# Patient Record
Sex: Female | Born: 1950 | Race: White | Hispanic: No | Marital: Married | Smoking: Never smoker
Health system: Southern US, Community
[De-identification: ages and names within clinical notes are randomized; demographics above are authoritative.]

## PROBLEM LIST (undated history)

## (undated) DIAGNOSIS — I43 Cardiomyopathy in diseases classified elsewhere: Secondary | ICD-10-CM

## (undated) DIAGNOSIS — R7881 Bacteremia: Secondary | ICD-10-CM

## (undated) DIAGNOSIS — E785 Hyperlipidemia, unspecified: Secondary | ICD-10-CM

## (undated) DIAGNOSIS — I2510 Atherosclerotic heart disease of native coronary artery without angina pectoris: Secondary | ICD-10-CM

## (undated) DIAGNOSIS — E039 Hypothyroidism, unspecified: Secondary | ICD-10-CM

## (undated) DIAGNOSIS — K219 Gastro-esophageal reflux disease without esophagitis: Secondary | ICD-10-CM

## (undated) DIAGNOSIS — E7800 Pure hypercholesterolemia, unspecified: Secondary | ICD-10-CM

## (undated) DIAGNOSIS — F419 Anxiety disorder, unspecified: Secondary | ICD-10-CM

## (undated) DIAGNOSIS — I700 Atherosclerosis of aorta: Secondary | ICD-10-CM

## (undated) DIAGNOSIS — N189 Chronic kidney disease, unspecified: Secondary | ICD-10-CM

## (undated) DIAGNOSIS — T50905A Adverse effect of unspecified drugs, medicaments and biological substances, initial encounter: Secondary | ICD-10-CM

## (undated) DIAGNOSIS — J189 Pneumonia, unspecified organism: Secondary | ICD-10-CM

## (undated) DIAGNOSIS — N183 Chronic kidney disease, stage 3 unspecified: Secondary | ICD-10-CM

## (undated) DIAGNOSIS — Z9989 Dependence on other enabling machines and devices: Secondary | ICD-10-CM

## (undated) DIAGNOSIS — R9431 Abnormal electrocardiogram [ECG] [EKG]: Secondary | ICD-10-CM

## (undated) DIAGNOSIS — Z9289 Personal history of other medical treatment: Secondary | ICD-10-CM

## (undated) DIAGNOSIS — M1990 Unspecified osteoarthritis, unspecified site: Secondary | ICD-10-CM

## (undated) DIAGNOSIS — I428 Other cardiomyopathies: Secondary | ICD-10-CM

## (undated) DIAGNOSIS — E669 Obesity, unspecified: Secondary | ICD-10-CM

## (undated) DIAGNOSIS — G4733 Obstructive sleep apnea (adult) (pediatric): Secondary | ICD-10-CM

## (undated) DIAGNOSIS — R011 Cardiac murmur, unspecified: Secondary | ICD-10-CM

## (undated) DIAGNOSIS — I10 Essential (primary) hypertension: Secondary | ICD-10-CM

## (undated) DIAGNOSIS — I480 Paroxysmal atrial fibrillation: Secondary | ICD-10-CM

## (undated) DIAGNOSIS — I5032 Chronic diastolic (congestive) heart failure: Secondary | ICD-10-CM

## (undated) HISTORY — DX: Essential (primary) hypertension: I10

## (undated) HISTORY — PX: TONSILLECTOMY: SUR1361

## (undated) HISTORY — DX: Paroxysmal atrial fibrillation: I48.0

## (undated) HISTORY — DX: Chronic diastolic (congestive) heart failure: I50.32

## (undated) HISTORY — DX: Obesity, unspecified: E66.9

## (undated) HISTORY — DX: Adverse effect of unspecified drugs, medicaments and biological substances, initial encounter: T50.905A

## (undated) HISTORY — PX: CARDIOVERSION: SHX1299

## (undated) HISTORY — DX: Gastro-esophageal reflux disease without esophagitis: K21.9

## (undated) HISTORY — DX: Cardiomyopathy in diseases classified elsewhere: I43

## (undated) HISTORY — DX: Atherosclerotic heart disease of native coronary artery without angina pectoris: I25.10

## (undated) HISTORY — DX: Abnormal electrocardiogram (ECG) (EKG): R94.31

## (undated) HISTORY — DX: Bacteremia: R78.81

## (undated) HISTORY — DX: Other cardiomyopathies: I42.8

---

## 1973-04-26 DIAGNOSIS — Z9289 Personal history of other medical treatment: Secondary | ICD-10-CM

## 1973-04-26 HISTORY — DX: Personal history of other medical treatment: Z92.89

## 1973-04-26 HISTORY — PX: ECTOPIC PREGNANCY SURGERY: SHX613

## 2005-04-26 HISTORY — PX: BREAST BIOPSY: SHX20

## 2006-01-12 ENCOUNTER — Ambulatory Visit: Payer: Self-pay

## 2013-08-27 ENCOUNTER — Inpatient Hospital Stay: Payer: Self-pay | Admitting: Internal Medicine

## 2013-08-27 DIAGNOSIS — I10 Essential (primary) hypertension: Secondary | ICD-10-CM

## 2013-08-27 DIAGNOSIS — I4891 Unspecified atrial fibrillation: Secondary | ICD-10-CM

## 2013-08-27 DIAGNOSIS — I5032 Chronic diastolic (congestive) heart failure: Secondary | ICD-10-CM

## 2013-08-27 DIAGNOSIS — I517 Cardiomegaly: Secondary | ICD-10-CM

## 2013-08-27 LAB — CBC
HCT: 37.9 % (ref 35.0–47.0)
HGB: 12.3 g/dL (ref 12.0–16.0)
MCH: 31.1 pg (ref 26.0–34.0)
MCHC: 32.4 g/dL (ref 32.0–36.0)
MCV: 96 fL (ref 80–100)
Platelet: 194 10*3/uL (ref 150–440)
RBC: 3.96 10*6/uL (ref 3.80–5.20)
RDW: 15.8 % — ABNORMAL HIGH (ref 11.5–14.5)
WBC: 5.3 10*3/uL (ref 3.6–11.0)

## 2013-08-27 LAB — PRO B NATRIURETIC PEPTIDE: B-TYPE NATIURETIC PEPTID: 1793 pg/mL — AB (ref 0–125)

## 2013-08-27 LAB — TROPONIN I
Troponin-I: <0.02 ng/mL
Troponin-I: <0.02 ng/mL
Troponin-I: <0.02 ng/mL

## 2013-08-27 LAB — BASIC METABOLIC PANEL
ANION GAP: 8 (ref 7–16)
BUN: 14 mg/dL (ref 7–18)
CALCIUM: 9.7 mg/dL (ref 8.5–10.1)
Chloride: 104 mmol/L (ref 98–107)
Co2: 27 mmol/L (ref 21–32)
Creatinine: 1.02 mg/dL (ref 0.60–1.30)
EGFR (African American): >60
GFR CALC NON AF AMER: 59 — AB
Glucose: 108 mg/dL — ABNORMAL HIGH (ref 65–99)
Osmolality: 279 (ref 275–301)
Potassium: 4 mmol/L (ref 3.5–5.1)
SODIUM: 139 mmol/L (ref 136–145)

## 2013-08-27 LAB — CK TOTAL AND CKMB (NOT AT ARMC)
CK, TOTAL: 48 U/L
CK, Total: 47 U/L
CK, Total: 48 U/L
CK-MB: 0.7 ng/mL (ref 0.5–3.6)
CK-MB: 1 ng/mL (ref 0.5–3.6)
CK-MB: 0.6 ng/mL (ref 0.5–3.6)

## 2013-08-27 LAB — APTT
ACTIVATED PTT: 28.3 s (ref 23.6–35.9)
Activated PTT: 50.6 secs — ABNORMAL HIGH (ref 23.6–35.9)

## 2013-08-27 LAB — PROTIME-INR
INR: 1.1
PROTHROMBIN TIME: 13.8 s (ref 11.5–14.7)

## 2013-08-27 LAB — TSH: THYROID STIMULATING HORM: 3.04 u[IU]/mL

## 2013-08-28 LAB — CBC WITH DIFFERENTIAL/PLATELET
BASOS PCT: 0.7 %
Basophil #: 0 10*3/uL (ref 0.0–0.1)
Eosinophil #: 0.2 10*3/uL (ref 0.0–0.7)
Eosinophil %: 2.8 %
HCT: 33.4 % — ABNORMAL LOW (ref 35.0–47.0)
HGB: 10.9 g/dL — ABNORMAL LOW (ref 12.0–16.0)
LYMPHS ABS: 1.6 10*3/uL (ref 1.0–3.6)
LYMPHS PCT: 26.1 %
MCH: 31.2 pg (ref 26.0–34.0)
MCHC: 32.7 g/dL (ref 32.0–36.0)
MCV: 96 fL (ref 80–100)
MONOS PCT: 8 %
Monocyte #: 0.5 x10 3/mm (ref 0.2–0.9)
NEUTROS PCT: 62.4 %
Neutrophil #: 3.8 10*3/uL (ref 1.4–6.5)
Platelet: 171 10*3/uL (ref 150–440)
RBC: 3.49 10*6/uL — ABNORMAL LOW (ref 3.80–5.20)
RDW: 15.9 % — ABNORMAL HIGH (ref 11.5–14.5)
WBC: 6 10*3/uL (ref 3.6–11.0)

## 2013-08-28 LAB — BASIC METABOLIC PANEL
Anion Gap: 6 — ABNORMAL LOW (ref 7–16)
BUN: 14 mg/dL (ref 7–18)
CREATININE: 1.25 mg/dL (ref 0.60–1.30)
Calcium, Total: 8.9 mg/dL (ref 8.5–10.1)
Chloride: 108 mmol/L — ABNORMAL HIGH (ref 98–107)
Co2: 26 mmol/L (ref 21–32)
EGFR (Non-African Amer.): 46 — ABNORMAL LOW
GFR CALC AF AMER: 54 — AB
Glucose: 124 mg/dL — ABNORMAL HIGH (ref 65–99)
OSMOLALITY: 281 (ref 275–301)
POTASSIUM: 3.6 mmol/L (ref 3.5–5.1)
Sodium: 140 mmol/L (ref 136–145)

## 2013-08-28 LAB — LIPID PANEL
Cholesterol: 194 mg/dL (ref 0–200)
HDL Cholesterol: 56 mg/dL (ref 40–60)
Ldl Cholesterol, Calc: 114 mg/dL — ABNORMAL HIGH (ref 0–100)
Triglycerides: 122 mg/dL (ref 0–200)
VLDL Cholesterol, Calc: 24 mg/dL (ref 5–40)

## 2013-08-28 LAB — APTT: ACTIVATED PTT: 65.5 s — AB (ref 23.6–35.9)

## 2013-08-29 DIAGNOSIS — I5032 Chronic diastolic (congestive) heart failure: Secondary | ICD-10-CM

## 2013-08-29 DIAGNOSIS — I4891 Unspecified atrial fibrillation: Secondary | ICD-10-CM

## 2013-08-30 ENCOUNTER — Telehealth: Payer: Self-pay

## 2013-08-30 NOTE — Telephone Encounter (Signed)
Patient contacted regarding discharge from [facility] on 08/28/13.  Patient understands to follow up with Ward Givens, NP on 09/06/13 at 1:15 at [facility] Patient understands discharge instructions? yes Patient understands medications and regiment? yes Patient understands to bring all medications to this visit? yes

## 2013-09-05 ENCOUNTER — Encounter: Payer: Self-pay | Admitting: *Deleted

## 2013-09-06 ENCOUNTER — Encounter (INDEPENDENT_AMBULATORY_CARE_PROVIDER_SITE_OTHER): Payer: Self-pay

## 2013-09-06 ENCOUNTER — Encounter: Payer: Self-pay | Admitting: Nurse Practitioner

## 2013-09-06 ENCOUNTER — Ambulatory Visit (INDEPENDENT_AMBULATORY_CARE_PROVIDER_SITE_OTHER): Payer: BC Managed Care – PPO | Admitting: Nurse Practitioner

## 2013-09-06 VITALS — BP 134/90 | HR 94 | Ht 63.0 in | Wt 268.8 lb

## 2013-09-06 DIAGNOSIS — I5032 Chronic diastolic (congestive) heart failure: Secondary | ICD-10-CM

## 2013-09-06 DIAGNOSIS — G4733 Obstructive sleep apnea (adult) (pediatric): Secondary | ICD-10-CM | POA: Insufficient documentation

## 2013-09-06 DIAGNOSIS — I119 Hypertensive heart disease without heart failure: Secondary | ICD-10-CM | POA: Insufficient documentation

## 2013-09-06 DIAGNOSIS — F329 Major depressive disorder, single episode, unspecified: Secondary | ICD-10-CM | POA: Insufficient documentation

## 2013-09-06 DIAGNOSIS — K219 Gastro-esophageal reflux disease without esophagitis: Secondary | ICD-10-CM | POA: Insufficient documentation

## 2013-09-06 DIAGNOSIS — I509 Heart failure, unspecified: Secondary | ICD-10-CM

## 2013-09-06 DIAGNOSIS — E669 Obesity, unspecified: Secondary | ICD-10-CM

## 2013-09-06 DIAGNOSIS — I10 Essential (primary) hypertension: Secondary | ICD-10-CM

## 2013-09-06 DIAGNOSIS — F32A Depression, unspecified: Secondary | ICD-10-CM | POA: Insufficient documentation

## 2013-09-06 DIAGNOSIS — I4891 Unspecified atrial fibrillation: Secondary | ICD-10-CM

## 2013-09-06 NOTE — Patient Instructions (Signed)
Your physician recommends that you return for lab work in:  CBC  BMP Digoxin Level  Your physician recommends that you schedule a follow-up appointment in:  2 weeks with Dr. Kirke Corin for possible cardioversion in 3 weeks   Your physician recommends that you continue on your current medications as directed. Please refer to the Current Medication list given to you today.

## 2013-09-06 NOTE — Progress Notes (Signed)
Patient Name: Stacey [name] Date of Encounter: 09/06/2013  Primary Care Provider:  Raliegh IpPHILLIPS,CHARLES W, MD Primary Cardiologist:  Judie PetitM. Kirke CorinArida, MD   Patient Profile  63 y/o female who was recently admitted AF RVR, who presents for f/u.  Problem List   Past Medical History  Diagnosis Date  . Chronic diastolic CHF (congestive heart failure)     a. Dx 08/2013 in setting of rapid afib;  b. 08/2013 Echo: EF 55-60%, mildly dil LA, nl RV.  Marland Kitchen. Atrial fibrillation     a. Dx 08/2013. CHA2DS2VASc = 3 (diast chf, htn, female) -->Xarelto initiated.  . Obesity   . GERD (gastroesophageal reflux disease)   . Hypertension   . Depression   . Presumed Obstructive Sleep Apnea     a. needs outpt sleep eval.   Past Surgical History  Procedure Laterality Date  . Tonsillectomy    . Ectopic pregnancy surgery      Allergies  Allergies  Allergen Reactions  . Levaquin [Levofloxacin In D5w]     Dizziness   . Other Other (See Comments)    HPI  63 y/o female with a h/o HTN and depression.  She was in her USOH until a few wks ago, when she began to experience mild dyspnea.  This progressed and prompted her to present to urgent care on 5/4 for evaluation.  There, she was found to be in afib with RVR with rates in the 150's.  She was transported to [facility] for further evaluation and admission.  She was initially placed on IV dilt and heparin, with good rate control and this was transitioned to PO dilt.  Duration of afib was not known but was felt to be > 48 hrs.  She was felt to have mild volume overload on exam and she was placed on oral lasix.  Echo showed nl LV fxn and a mildly dilated LA.  With a CHA2DS2VASc of 3 (diast chf, htn, female), decision was made to initiate long term anticoagulation and xarelto 20mg  daily was started.  BB and oral digoxin were added to her PO dilt and she was discharged home on 5/7 with adequate rate control with a plan for ongoing anticoagulation and DCCV in 3 wks.  Since  discharge, she has been feeling well.  She has not been having any DOE and denies palpitations, pnd, orthopnea, n, v, dizziness, syncope, or edema.  Home Medications  Prior to Admission medications   Medication Sig Start Date End Date Taking? Authorizing Provider  digoxin (LANOXIN) 0.25 MG tablet Take 0.25 mg by mouth daily.   Yes Historical Provider, MD  diltiazem (CARDIZEM CD) 120 MG 24 hr capsule Take 120 mg by mouth daily.   Yes Historical Provider, MD  esomeprazole (NEXIUM) 40 MG capsule Take 40 mg by mouth every morning.    Yes Historical Provider, MD  furosemide (LASIX) 40 MG tablet Take 40 mg by mouth daily.   Yes Historical Provider, MD  metoprolol tartrate (LOPRESSOR) 25 MG tablet Take 25 mg by mouth 2 (two) times daily.   Yes Historical Provider, MD  rivaroxaban (XARELTO) 20 MG TABS tablet Take 20 mg by mouth daily with supper.   Yes Historical Provider, MD  venlafaxine (EFFEXOR) 37.5 MG tablet Take 37.5 mg by mouth daily.   Yes Historical Provider, MD  zolpidem (AMBIEN) 10 MG tablet Take 10 mg by mouth at bedtime as needed.    Yes Historical Provider, MD    Review of Systems  She has been doing  well as above.  She denies chest pain, palpitations, dyspnea, pnd, orthopnea, n, v, dizziness, syncope, edema, weight gain, or early satiety.  All other systems reviewed and are otherwise negative except as noted above.  Physical Exam  Blood pressure 134/90, pulse 94, height 5\' 3"  (1.6 m), weight 268 lb 12 oz (121.904 kg).  General: Pleasant, NAD Psych: Normal affect. Neuro: Alert and oriented X 3. Moves all extremities spontaneously. HEENT: Normal  Neck: Supple without bruits or JVD. Lungs:  Resp regular and unlabored, CTA. Heart: IR, IR no s3, s4, or murmurs. Abdomen: Soft, non-tender, non-distended, BS + x 4.  Extremities: No clubbing, cyanosis or edema. DP/PT/Radials 2+ and equal bilaterally.  Accessory Clinical Findings  ECG - afib, 90, no acute st/t changes.  Assessment &  Plan  1.  Afib:  Pt was recently admitted with afib and rvr.  She is now reasonably rate controlled.  She says resting rates @ home are generally in the 80's.  She is tolerating her ccb, bb, digoxin, and xarelto.  Will continue these meds for now and have her f/u in 2 wks @ which point, if she remains in afib, we will arrange for DCCV.  She will not require a TEE @ that point as she will have been anticoagulated for 3 wks.  Provided that DCCV is successful, we can hopefully d/c digoxin and consolidate her AVN blocking agents - but will wait until after cardioversion as this regimen is currently working.  Will check bmet/cbc/digoxin level today.  2.  Chronic diastolic chf:  She had mild volume overload in the setting of rapid afib during hospitalization.  Euvolemic on exam today.  She was discharged home on lasix 40mg  daily, which was an increase from her prior home dose.  Will check bmet today.  HR reasonable.  BP mildly elevated however she says that pressure is better @ home (130/80).  3.  HTN:  As above, BP mildly elevated today -runs better @ home.  Cont bb/dilt.  4.  Obesity:  We will need to address this in more depth going forward.  5.  ? OSA:  She snores @ night and will need a sleep study.  We should try to arrange this after DCCV.  6.  Dispo:  F/U in 2 wks with plan for DCCV afterwards if still in fib @ that time.  Will eventually need sleep eval.   Ok Anis, NP 09/06/2013, 3:18 PM

## 2013-09-07 ENCOUNTER — Telehealth: Payer: Self-pay | Admitting: *Deleted

## 2013-09-07 DIAGNOSIS — I5032 Chronic diastolic (congestive) heart failure: Secondary | ICD-10-CM

## 2013-09-07 LAB — BASIC METABOLIC PANEL
BUN/Creatinine Ratio: 15 (ref 11–26)
BUN: 22 mg/dL (ref 8–27)
CALCIUM: 9.6 mg/dL (ref 8.7–10.3)
CO2: 23 mmol/L (ref 18–29)
Chloride: 101 mmol/L (ref 97–108)
Creatinine, Ser: 1.44 mg/dL — ABNORMAL HIGH (ref 0.57–1.00)
GFR calc Af Amer: 45 mL/min/{1.73_m2} — ABNORMAL LOW (ref >59)
GFR, EST NON AFRICAN AMERICAN: 39 mL/min/{1.73_m2} — AB (ref >59)
Glucose: 151 mg/dL — ABNORMAL HIGH (ref 65–99)
Potassium: 4.9 mmol/L (ref 3.5–5.2)
SODIUM: 144 mmol/L (ref 134–144)

## 2013-09-07 LAB — CBC WITH DIFFERENTIAL
BASOS ABS: 0.1 10*3/uL (ref 0.0–0.2)
Basos: 1 %
EOS ABS: 0.2 10*3/uL (ref 0.0–0.4)
Eos: 3 %
HCT: 42.7 % (ref 34.0–46.6)
Hemoglobin: 14.2 g/dL (ref 11.1–15.9)
IMMATURE GRANS (ABS): 0 10*3/uL (ref 0.0–0.1)
Immature Granulocytes: 0 %
Lymphocytes Absolute: 1.4 10*3/uL (ref 0.7–3.1)
Lymphs: 21 %
MCH: 30.7 pg (ref 26.6–33.0)
MCHC: 33.3 g/dL (ref 31.5–35.7)
MCV: 92 fL (ref 79–97)
MONOCYTES: 7 %
Monocytes Absolute: 0.5 10*3/uL (ref 0.1–0.9)
NEUTROS PCT: 68 %
Neutrophils Absolute: 4.5 10*3/uL (ref 1.4–7.0)
Platelets: 263 10*3/uL (ref 150–379)
RBC: 4.63 x10E6/uL (ref 3.77–5.28)
RDW: 15.2 % (ref 12.3–15.4)
WBC: 6.6 10*3/uL (ref 3.4–10.8)

## 2013-09-07 LAB — DIGOXIN LEVEL: Digoxin Level: 1.5 ng/mL (ref 0.9–2.0)

## 2013-09-07 MED ORDER — FUROSEMIDE 40 MG PO TABS
20.0000 mg | ORAL_TABLET | Freq: Every day | ORAL | Status: DC
Start: 2013-09-07 — End: 2013-10-30

## 2013-09-07 MED ORDER — DIGOXIN 250 MCG PO TABS: 0.1250 mg | ORAL_TABLET | Freq: Every day | ORAL | Status: DC

## 2013-09-07 NOTE — Telephone Encounter (Signed)
Message copied by Fransico Setters on Fri Sep 07, 2013  4:47 PM ------      Message from: Nicolasa Ducking R      Created: Fri Sep 07, 2013  1:15 PM       Lets have her cut her lasix back to 20mg  daily.  Also reduce digoxin to 1/2 tab daily as level is getting up and Creat up slightly.  She should have f/u bmet when seen in 2 wks.  OTW labs ok. ------

## 2013-09-21 ENCOUNTER — Encounter: Payer: Self-pay | Admitting: *Deleted

## 2013-09-21 ENCOUNTER — Ambulatory Visit (INDEPENDENT_AMBULATORY_CARE_PROVIDER_SITE_OTHER): Payer: BC Managed Care – PPO | Admitting: Cardiovascular Disease

## 2013-09-21 ENCOUNTER — Other Ambulatory Visit: Payer: BC Managed Care – PPO

## 2013-09-21 ENCOUNTER — Encounter: Payer: Self-pay | Admitting: Cardiovascular Disease

## 2013-09-21 VITALS — BP 148/90 | HR 92 | Ht 63.0 in | Wt 276.5 lb

## 2013-09-21 DIAGNOSIS — I4891 Unspecified atrial fibrillation: Secondary | ICD-10-CM

## 2013-09-21 DIAGNOSIS — G4733 Obstructive sleep apnea (adult) (pediatric): Secondary | ICD-10-CM

## 2013-09-21 DIAGNOSIS — I10 Essential (primary) hypertension: Secondary | ICD-10-CM

## 2013-09-21 DIAGNOSIS — I5032 Chronic diastolic (congestive) heart failure: Secondary | ICD-10-CM

## 2013-09-21 DIAGNOSIS — I509 Heart failure, unspecified: Secondary | ICD-10-CM

## 2013-09-21 NOTE — Assessment & Plan Note (Signed)
She appears to be euvolemic on current medications. I will consider stress testing after maintaining sinus rhythm.

## 2013-09-21 NOTE — Assessment & Plan Note (Signed)
She will require a sleep study after cardioversion.

## 2013-09-21 NOTE — Assessment & Plan Note (Signed)
Blood pressure is mildly elevated

## 2013-09-21 NOTE — Assessment & Plan Note (Addendum)
She is doing reasonably well. Ventricular rate is controlled. However, she continues to have fatigue and exertional dyspnea. Thus, I recommend proceeding with cardioversion. She has been on anticoagulation for more than 3 weeks. I discontinued digoxin and continued treatment with metoprolol and diltiazem. We should consider screening for sleep apnea after cardioversion.

## 2013-09-21 NOTE — Progress Notes (Signed)
HPI  63 y/o female who is here today for a followup visit regarding atrial fibrillation. She has known history of  HTN and depression.  She was hospitalized at [facility] early this month with atrial fibrillation with RVR.  She was felt to have mild volume overload on exam and she was placed on oral lasix.  Echo showed nl LV fxn and a mildly dilated LA.  With a CHA2DS2VASc of 3 (diast chf, htn, female), decision was made to initiate long term anticoagulation and xarelto 20mg  daily was started.   She has been doing reasonably well. However, she continues to complain of fatigue and mild exertional dyspnea. She has been compliant with her medications.  Allergies  Allergen Reactions  . Levaquin [Levofloxacin In D5w]     Dizziness   . Other Other (See Comments)     Current Outpatient Prescriptions on File Prior to Visit  Medication Sig Dispense Refill  . digoxin (LANOXIN) 0.25 MG tablet Take 0.5 tablets (0.125 mg total) by mouth daily.      Marland Kitchen. diltiazem (CARDIZEM CD) 120 MG 24 hr capsule Take 120 mg by mouth daily.      Marland Kitchen. esomeprazole (NEXIUM) 40 MG capsule Take 40 mg by mouth every morning.       . furosemide (LASIX) 40 MG tablet Take 0.5 tablets (20 mg total) by mouth daily.  30 tablet  0  . metoprolol tartrate (LOPRESSOR) 25 MG tablet Take 25 mg by mouth 2 (two) times daily.      . rivaroxaban (XARELTO) 20 MG TABS tablet Take 20 mg by mouth daily with supper.      . venlafaxine (EFFEXOR) 37.5 MG tablet Take 37.5 mg by mouth daily.      Marland Kitchen. zolpidem (AMBIEN) 10 MG tablet Take 10 mg by mouth at bedtime as needed.        No current facility-administered medications on file prior to visit.     Past Medical History  Diagnosis Date  . Chronic diastolic CHF (congestive heart failure)     a. Dx 08/2013 in setting of rapid afib;  b. 08/2013 Echo: EF 55-60%, mildly dil LA, nl RV.  Marland Kitchen. Atrial fibrillation     a. Dx 08/2013. CHA2DS2VASc = 3 (diast chf, htn, female) -->Xarelto initiated.  . Obesity   .  GERD (gastroesophageal reflux disease)   . Hypertension   . Depression   . Presumed Obstructive Sleep Apnea     a. needs outpt sleep eval.     Past Surgical History  Procedure Laterality Date  . Tonsillectomy    . Ectopic pregnancy surgery       Family History  Problem Relation Age of Onset  . CAD Father     s/p cabg - ? h/o afib.  Marland Kitchen. Heart attack Father   . Hypertension Mother      History   Social History  . Marital Status: Married    Spouse Name: N/A    Number of Children: N/A  . Years of Education: N/A   Occupational History  . Not on file.   Social History Main Topics  . Smoking status: Never Smoker   . Smokeless tobacco: Not on file  . Alcohol Use: Yes     Comment: 1 glass of wine/day.  . Drug Use: No  . Sexual Activity: Not on file   Other Topics Concern  . Not on file   Social History Narrative   [place]ives in [place].  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  PHYSICAL EXAM   BP 148/90  Pulse 92  Ht 5\' 3"  (1.6 m)  Wt 276 lb 8 oz (125.42 kg)  BMI 48.99 kg/m2 Constitutional: She is oriented to person, place, and time. She appears well-developed and well-nourished. No distress.  HENT: No nasal discharge.  Head: Normocephalic and atraumatic.  Eyes: Pupils are equal and round. No discharge.  Neck: Normal range of motion. Neck supple. No JVD present. No thyromegaly present.  Cardiovascular: Normal rate, irregular rhythm, normal heart sounds. Exam reveals no gallop and no friction rub. No murmur heard.  Pulmonary/Chest: Effort normal and breath sounds normal. No stridor. No respiratory distress. She has no wheezes. She has no rales. She exhibits no tenderness.  Abdominal: Soft. Bowel sounds are normal. She exhibits no distension. There is no tenderness. There is no rebound and no guarding.  Musculoskeletal: Normal range of motion. She exhibits no edema and no tenderness.  Neurological: She is alert and oriented to person, place, and time. Coordination normal.    Skin: Skin is warm and dry. No rash noted. She is not diaphoretic. No erythema. No pallor.  Psychiatric: She has a normal mood and affect. Her behavior is normal. Judgment and thought content normal.     QTM:AUQJFH fibrillation  -Poor R-wave progression -may be secondary to pulmonary disease   consider old anterior infarct.   -  Diffuse nonspecific T-abnormality.   Low voltage -possible pulmonary disease.   ABNORMAL    ASSESSMENT AND PLAN

## 2013-09-21 NOTE — Patient Instructions (Signed)
Your physician has recommended that you have a Cardioversion (DCCV). Electrical Cardioversion uses a jolt of electricity to your heart either through paddles or wired patches attached to your chest. This is a controlled, usually prescheduled, procedure. Defibrillation is done under light anesthesia in the hospital, and you usually go home the day of the procedure. This is done to get your heart back into a normal rhythm. You are not awake for the procedure. Please see the instruction sheet given to you today.  Your physician has recommended you make the following change in your medication:  Stop Digoxin

## 2013-09-22 LAB — BASIC METABOLIC PANEL
BUN/Creatinine Ratio: 15 (ref 11–26)
BUN: 18 mg/dL (ref 8–27)
CALCIUM: 9.2 mg/dL (ref 8.7–10.3)
CO2: 20 mmol/L (ref 18–29)
Chloride: 102 mmol/L (ref 97–108)
Creatinine, Ser: 1.21 mg/dL — ABNORMAL HIGH (ref 0.57–1.00)
GFR calc Af Amer: 56 mL/min/{1.73_m2} — ABNORMAL LOW (ref >59)
GFR calc non Af Amer: 48 mL/min/{1.73_m2} — ABNORMAL LOW (ref >59)
GLUCOSE: 109 mg/dL — AB (ref 65–99)
POTASSIUM: 4.5 mmol/L (ref 3.5–5.2)
SODIUM: 145 mmol/L — AB (ref 134–144)

## 2013-09-22 LAB — CBC WITH DIFFERENTIAL
Basophils Absolute: 0 10*3/uL (ref 0.0–0.2)
Basos: 1 %
EOS ABS: 0.2 10*3/uL (ref 0.0–0.4)
Eos: 3 %
HCT: 39.1 % (ref 34.0–46.6)
Hemoglobin: 13 g/dL (ref 11.1–15.9)
IMMATURE GRANS (ABS): 0 10*3/uL (ref 0.0–0.1)
Immature Granulocytes: 0 %
LYMPHS: 27 %
Lymphocytes Absolute: 1.8 10*3/uL (ref 0.7–3.1)
MCH: 30.8 pg (ref 26.6–33.0)
MCHC: 33.2 g/dL (ref 31.5–35.7)
MCV: 93 fL (ref 79–97)
MONOS ABS: 0.4 10*3/uL (ref 0.1–0.9)
Monocytes: 7 %
NEUTROS PCT: 62 %
Neutrophils Absolute: 4.2 10*3/uL (ref 1.4–7.0)
Platelets: 202 10*3/uL (ref 150–379)
RBC: 4.22 x10E6/uL (ref 3.77–5.28)
RDW: 14.7 % (ref 12.3–15.4)
WBC: 6.6 10*3/uL (ref 3.4–10.8)

## 2013-09-22 LAB — PROTIME-INR
INR: 1.2 (ref 0.8–1.2)
Prothrombin Time: 12.3 s — ABNORMAL HIGH (ref 9.1–12.0)

## 2013-09-27 ENCOUNTER — Telehealth: Payer: Self-pay | Admitting: *Deleted

## 2013-09-27 NOTE — Telephone Encounter (Signed)
Cardioversion paperwork faxed to [facility]  Receipt confirmed by [place]

## 2013-09-28 ENCOUNTER — Ambulatory Visit: Payer: Self-pay | Admitting: Cardiovascular Disease

## 2013-09-28 ENCOUNTER — Other Ambulatory Visit: Payer: Self-pay | Admitting: *Deleted

## 2013-09-28 DIAGNOSIS — I4891 Unspecified atrial fibrillation: Secondary | ICD-10-CM

## 2013-09-28 MED ORDER — METOPROLOL TARTRATE 25 MG PO TABS: 25.0000 mg | ORAL_TABLET | Freq: Two times a day (BID) | ORAL | Status: DC

## 2013-09-28 MED ORDER — RIVAROXABAN 20 MG PO TABS: 20.0000 mg | ORAL_TABLET | Freq: Every day | ORAL | Status: DC

## 2013-09-28 MED ORDER — DILTIAZEM HCL ER COATED BEADS 120 MG PO CP24: 120.0000 mg | ORAL_CAPSULE | Freq: Every day | ORAL | Status: DC

## 2013-09-28 MED ORDER — ESOMEPRAZOLE MAGNESIUM 40 MG PO CPDR: 40.0000 mg | DELAYED_RELEASE_CAPSULE | Freq: Every morning | ORAL | Status: DC

## 2013-10-08 ENCOUNTER — Encounter: Payer: Self-pay | Admitting: Cardiovascular Disease

## 2013-10-08 ENCOUNTER — Ambulatory Visit (INDEPENDENT_AMBULATORY_CARE_PROVIDER_SITE_OTHER): Payer: BC Managed Care – PPO | Admitting: Cardiovascular Disease

## 2013-10-08 ENCOUNTER — Encounter: Payer: Self-pay | Admitting: *Deleted

## 2013-10-08 VITALS — BP 134/84 | HR 63 | Ht 63.0 in | Wt 275.2 lb

## 2013-10-08 DIAGNOSIS — G4733 Obstructive sleep apnea (adult) (pediatric): Secondary | ICD-10-CM

## 2013-10-08 DIAGNOSIS — I4891 Unspecified atrial fibrillation: Secondary | ICD-10-CM

## 2013-10-08 DIAGNOSIS — I509 Heart failure, unspecified: Secondary | ICD-10-CM

## 2013-10-08 DIAGNOSIS — I5032 Chronic diastolic (congestive) heart failure: Secondary | ICD-10-CM

## 2013-10-08 DIAGNOSIS — I10 Essential (primary) hypertension: Secondary | ICD-10-CM

## 2013-10-08 NOTE — Assessment & Plan Note (Signed)
Blood pressure is well controlled on current medications. 

## 2013-10-08 NOTE — Assessment & Plan Note (Signed)
She is maintaining in sinus rhythm after recent cardioversion. Continue treatment with diltiazem and metoprolol. Continue long-term anticoagulation with Xarelto.  She does have bruising in her right arm. I asked her to be more careful.

## 2013-10-08 NOTE — Assessment & Plan Note (Signed)
She has symptoms suggestive of sleep apnea which might be contributing to hypotension and atrial fibrillation. I am referring her to pulmonary for evaluation of sleep apnea and treatment as needed.

## 2013-10-08 NOTE — Assessment & Plan Note (Signed)
This has improved significantly after conversion to sinus rhythm. Continue small dose furosemide.

## 2013-10-08 NOTE — Progress Notes (Signed)
HPI  63 y/o female who is here today for a followup visit regarding atrial fibrillation. She has known history of  HTN and depression.  She was hospitalized at [facility] in May with atrial fibrillation with RVR.  She was felt to have mild volume overload on exam and she was placed on oral lasix.  Echo showed nl LV fxn and a mildly dilated LA.  With a CHA2DS2VASc of 3 (diast chf, htn, female), decision was made to initiate long term anticoagulation and xarelto 20mg  daily was started.   She underwent successful cardioversion last week. She reports no further palpitations since then. She had an episode of shortness of breath today when she was in the shower. This resolved without intervention. She denies any chest pain.  Allergies  Allergen Reactions  . Levaquin [Levofloxacin In D5w]     Dizziness   . Other Other (See Comments)     Current Outpatient Prescriptions on File Prior to Visit  Medication Sig Dispense Refill  . diltiazem (CARDIZEM CD) 120 MG 24 hr capsule Take 1 capsule (120 mg total) by mouth daily.  30 capsule  3  . esomeprazole (NEXIUM) 40 MG capsule Take 1 capsule (40 mg total) by mouth every morning.  30 capsule  3  . furosemide (LASIX) 40 MG tablet Take 0.5 tablets (20 mg total) by mouth daily.  30 tablet  0  . metoprolol tartrate (LOPRESSOR) 25 MG tablet Take 1 tablet (25 mg total) by mouth 2 (two) times daily.  60 tablet  3  . rivaroxaban (XARELTO) 20 MG TABS tablet Take 1 tablet (20 mg total) by mouth daily with supper.  30 tablet  3  . venlafaxine (EFFEXOR) 37.5 MG tablet Take 37.5 mg by mouth daily.      Marland Kitchen. zolpidem (AMBIEN) 10 MG tablet Take 10 mg by mouth at bedtime as needed.        No current facility-administered medications on file prior to visit.     Past Medical History  Diagnosis Date  . Chronic diastolic CHF (congestive heart failure)     a. Dx 08/2013 in setting of rapid afib;  b. 08/2013 Echo: EF 55-60%, mildly dil LA, nl RV.  Marland Kitchen. Atrial fibrillation     a. Dx  08/2013. CHA2DS2VASc = 3 (diast chf, htn, female) -->Xarelto initiated.  . Obesity   . GERD (gastroesophageal reflux disease)   . Hypertension   . Depression   . Presumed Obstructive Sleep Apnea     a. needs outpt sleep eval.     Past Surgical History  Procedure Laterality Date  . Tonsillectomy    . Ectopic pregnancy surgery       Family History  Problem Relation Age of Onset  . CAD Father     s/p cabg - ? h/o afib.  Marland Kitchen. Heart attack Father   . Hypertension Mother      History   Social History  . Marital Status: Married    Spouse Name: N/A    Number of Children: N/A  . Years of Education: N/A   Occupational History  . Not on file.   Social History Main Topics  . Smoking status: Never Smoker   . Smokeless tobacco: Not on file  . Alcohol Use: Yes     Comment: 1 glass of wine/day.  . Drug Use: No  . Sexual Activity: Not on file   Other Topics Concern  . Not on file   Social History Narrative   Lives in [place].  Works as Water quality scientist.      PHYSICAL EXAM   BP 134/84  Pulse 63  Ht 5\' 3"  (1.6 m)  Wt 275 lb 4 oz (124.853 kg)  BMI 48.77 kg/m2 Constitutional: She is oriented to person, place, and time. She appears well-developed and well-nourished. No distress.  HENT: No nasal discharge.  Head: Normocephalic and atraumatic.  Eyes: Pupils are equal and round. No discharge.  Neck: Normal range of motion. Neck supple. No JVD present. No thyromegaly present.  Cardiovascular: Normal rate, irregular rhythm, normal heart sounds. Exam reveals no gallop and no friction rub. No murmur heard.  Pulmonary/Chest: Effort normal and breath sounds normal. No stridor. No respiratory distress. She has no wheezes. She has no rales. She exhibits no tenderness.  Abdominal: Soft. Bowel sounds are normal. She exhibits no distension. There is no tenderness. There is no rebound and no guarding.  Musculoskeletal: Normal range of motion. She exhibits no edema and no tenderness.    Neurological: She is alert and oriented to person, place, and time. Coordination normal.  Skin: Skin is warm and dry. No rash noted. She is not diaphoretic. No erythema. No pallor.  Psychiatric: She has a normal mood and affect. Her behavior is normal. Judgment and thought content normal.     XFG:HWEXH  Rhythm  Low voltage in precordial leads.   -Poor R-wave progression -may be secondary to pulmonary disease   consider old anterior infarct.   -  Nonspecific T-abnormality.   ABNORMAL    ASSESSMENT AND PLAN

## 2013-10-08 NOTE — Patient Instructions (Signed)
Refer to Dr. Freda Munro for evaluation of sleep apnea.  Continue same medications.   Follow up in 3 months.

## 2013-10-24 HISTORY — PX: CARDIAC CATHETERIZATION: SHX172

## 2013-10-30 ENCOUNTER — Other Ambulatory Visit: Payer: Self-pay

## 2013-10-30 MED ORDER — FUROSEMIDE 40 MG PO TABS: 20.0000 mg | ORAL_TABLET | Freq: Every day | ORAL | Status: DC

## 2013-10-30 NOTE — Telephone Encounter (Signed)
Refill sent for furosemide 40 take 0.5 mg daily.

## 2013-11-06 ENCOUNTER — Telehealth: Payer: Self-pay | Admitting: *Deleted

## 2013-11-06 ENCOUNTER — Ambulatory Visit (INDEPENDENT_AMBULATORY_CARE_PROVIDER_SITE_OTHER): Payer: BC Managed Care – PPO | Admitting: Cardiovascular Disease

## 2013-11-06 ENCOUNTER — Encounter: Payer: Self-pay | Admitting: Cardiovascular Disease

## 2013-11-06 ENCOUNTER — Encounter: Payer: Self-pay | Admitting: *Deleted

## 2013-11-06 VITALS — BP 139/88 | HR 113 | Ht 63.0 in | Wt 286.5 lb

## 2013-11-06 DIAGNOSIS — I10 Essential (primary) hypertension: Secondary | ICD-10-CM

## 2013-11-06 DIAGNOSIS — G4733 Obstructive sleep apnea (adult) (pediatric): Secondary | ICD-10-CM

## 2013-11-06 DIAGNOSIS — I4891 Unspecified atrial fibrillation: Secondary | ICD-10-CM

## 2013-11-06 DIAGNOSIS — Z01812 Encounter for preprocedural laboratory examination: Secondary | ICD-10-CM

## 2013-11-06 MED ORDER — FLECAINIDE ACETATE 50 MG PO TABS
50.0000 mg | ORAL_TABLET | Freq: Two times a day (BID) | ORAL | Status: DC
Start: 2013-11-06 — End: 2013-11-15

## 2013-11-06 MED ORDER — METOPROLOL TARTRATE 50 MG PO TABS: 50.0000 mg | ORAL_TABLET | Freq: Two times a day (BID) | ORAL | Status: DC

## 2013-11-06 NOTE — Patient Instructions (Signed)
Your physician has recommended that you have a Cardioversion (DCCV). Electrical Cardioversion uses a jolt of electricity to your heart either through paddles or wired patches attached to your chest. This is a controlled, usually prescheduled, procedure. Defibrillation is done under light anesthesia in the hospital, and you usually go home the day of the procedure. This is done to get your heart back into a normal rhythm. You are not awake for the procedure. Please see the instruction sheet given to you today.  Your physician has recommended you make the following change in your medication:  Stop Diltiazem  Start Metoprolol 50 mg twice daily  Flecainide 50 mg twice daily      Do not return to work until Monday 11/12/13

## 2013-11-06 NOTE — Telephone Encounter (Signed)
Patient called from work and she is having sob with very little excersion. Same symptoms before cardioversion. Please call.

## 2013-11-06 NOTE — Assessment & Plan Note (Signed)
Blood pressure is well controlled 

## 2013-11-06 NOTE — Telephone Encounter (Signed)
Pt is at work today  She stated her heart rate is jumping from 80 to 140 She is short of breath and her O2 sats are dropping into the 80's with minimal exertion  She stated she feels like she did before her cardioversion   Patient scheduled to see Dr. Kirke Corin today

## 2013-11-06 NOTE — Assessment & Plan Note (Signed)
I think this is likely contributing to recurrent atrial fibrillation with rapid ventricular response. I advised her to reschedule her appointment for evaluation.

## 2013-11-06 NOTE — Assessment & Plan Note (Signed)
The patient is back in atrial fibrillation with rapid ventricular response. She is very symptomatic with significant dyspnea palpitations. She is already on anticoagulation with Xarelto and has not missed any doses. I think she needs to be on antiarrhythmic medication. Ejection fraction was normal with no significant left ventricular hypertrophy. She has no history of ischemic heart disease. Thus, she is a candidate for a class IC antiarrhythmic medications. I elected to stop diltiazem and start flecainide 50 mg twice daily. I increased the dose of metoprolol to 50 mg twice daily. Proceed with cardioversion later this week. I advised her to stay off work until Monday given her significant limitations.

## 2013-11-06 NOTE — Telephone Encounter (Signed)
Ok

## 2013-11-06 NOTE — Progress Notes (Signed)
HPI  63 y/o female who is here today for a followup visit regarding atrial fibrillation. She was added to my schedule today as she felt that she was in A. fib .She has known history of  HTN and depression.  She was hospitalized at [facility] in May with atrial fibrillation with RVR.  She had mild diastolic heart failure and was placed on oral lasix.  Echo showed nl LV fxn and a mildly dilated LA with no significant LVH.  With a CHA2DS2VASc of 3 (diast chf, htn, female), decision was made to initiate long term anticoagulation and xarelto 20mg  daily was started.   She underwent successful cardioversion in June. She is suspected of having sleep apnea and was referred to Dr. Welton Flakes but she has not scheduled appointment yet. She noted increased palpitations and shortness of breath over the last 2 days similar to previous episode of atrial fibrillation. She is significantly limited by this. Heart rate early everyday was 150 beats per minute.  Allergies  Allergen Reactions  . Levaquin [Levofloxacin In D5w]     Dizziness   . Other Other (See Comments)     Current Outpatient Prescriptions on File Prior to Visit  Medication Sig Dispense Refill  . esomeprazole (NEXIUM) 40 MG capsule Take 1 capsule (40 mg total) by mouth every morning.  30 capsule  3  . naproxen sodium (ANAPROX) 220 MG tablet Take 220 mg by mouth as needed.       . rivaroxaban (XARELTO) 20 MG TABS tablet Take 1 tablet (20 mg total) by mouth daily with supper.  30 tablet  3  . venlafaxine (EFFEXOR) 37.5 MG tablet Take 37.5 mg by mouth daily.      Marland Kitchen zolpidem (AMBIEN) 10 MG tablet Take 10 mg by mouth at bedtime as needed.        No current facility-administered medications on file prior to visit.     Past Medical History  Diagnosis Date  . Chronic diastolic CHF (congestive heart failure)     a. Dx 08/2013 in setting of rapid afib;  b. 08/2013 Echo: EF 55-60%, mildly dil LA, nl RV.  Marland Kitchen Atrial fibrillation     a. Dx 08/2013. CHA2DS2VASc = 3  (diast chf, htn, female) -->Xarelto initiated.  . Obesity   . GERD (gastroesophageal reflux disease)   . Hypertension   . Depression   . Presumed Obstructive Sleep Apnea     a. needs outpt sleep eval.     Past Surgical History  Procedure Laterality Date  . Tonsillectomy    . Ectopic pregnancy surgery       Family History  Problem Relation Age of Onset  . CAD Father     s/p cabg - ? h/o afib.  Marland Kitchen Heart attack Father   . Hypertension Mother      History   Social History  . Marital Status: Married    Spouse Name: N/A    Number of Children: N/A  . Years of Education: N/A   Occupational History  . Not on file.   Social History Main Topics  . Smoking status: Never Smoker   . Smokeless tobacco: Not on file  . Alcohol Use: Yes     Comment: 1 glass of wine/day.  . Drug Use: No  . Sexual Activity: Not on file   Other Topics Concern  . Not on file   Social History Narrative   Lives in [place].  Works as Water quality scientist.  PHYSICAL EXAM   BP 139/88  Pulse 113  Ht 5\' 3"  (1.6 m)  Wt 286 lb 8 oz (129.956 kg)  BMI 50.76 kg/m2 Constitutional: She is oriented to person, place, and time. She appears well-developed and well-nourished. No distress.  HENT: No nasal discharge.  Head: Normocephalic and atraumatic.  Eyes: Pupils are equal and round. No discharge.  Neck: Normal range of motion. Neck supple. No JVD present. No thyromegaly present.  Cardiovascular: Tachycardic, irregular rhythm, normal heart sounds. Exam reveals no gallop and no friction rub. No murmur heard.  Pulmonary/Chest: Effort normal and breath sounds normal. No stridor. No respiratory distress. She has no wheezes. She has no rales. She exhibits no tenderness.  Abdominal: Soft. Bowel sounds are normal. She exhibits no distension. There is no tenderness. There is no rebound and no guarding.  Musculoskeletal: Normal range of motion. She exhibits no edema and no tenderness.  Neurological: She is  alert and oriented to person, place, and time. Coordination normal.  Skin: Skin is warm and dry. No rash noted. She is not diaphoretic. No erythema. No pallor.  Psychiatric: She has a normal mood and affect. Her behavior is normal. Judgment and thought content normal.     EKG: Atrial fibrillation with rapid ventricular response. Heart rate is 113 beats per minute.   ASSESSMENT AND PLAN

## 2013-11-07 ENCOUNTER — Telehealth: Payer: Self-pay | Admitting: *Deleted

## 2013-11-07 LAB — BASIC METABOLIC PANEL
BUN/Creatinine Ratio: 17 (ref 11–26)
BUN: 28 mg/dL — ABNORMAL HIGH (ref 8–27)
CHLORIDE: 100 mmol/L (ref 97–108)
CO2: 22 mmol/L (ref 18–29)
Calcium: 9.7 mg/dL (ref 8.7–10.3)
Creatinine, Ser: 1.67 mg/dL — ABNORMAL HIGH (ref 0.57–1.00)
GFR calc non Af Amer: 33 mL/min/{1.73_m2} — ABNORMAL LOW (ref >59)
GFR, EST AFRICAN AMERICAN: 38 mL/min/{1.73_m2} — AB (ref >59)
GLUCOSE: 109 mg/dL — AB (ref 65–99)
POTASSIUM: 4.1 mmol/L (ref 3.5–5.2)
SODIUM: 143 mmol/L (ref 134–144)

## 2013-11-07 LAB — CBC WITH DIFFERENTIAL
Basophils Absolute: 0 10*3/uL (ref 0.0–0.2)
Basos: 0 %
EOS ABS: 0.2 10*3/uL (ref 0.0–0.4)
Eos: 2 %
HCT: 35.2 % (ref 34.0–46.6)
Hemoglobin: 11.1 g/dL (ref 11.1–15.9)
Immature Grans (Abs): 0 10*3/uL (ref 0.0–0.1)
Immature Granulocytes: 0 %
LYMPHS: 21 %
Lymphocytes Absolute: 1.6 10*3/uL (ref 0.7–3.1)
MCH: 29.9 pg (ref 26.6–33.0)
MCHC: 31.5 g/dL (ref 31.5–35.7)
MCV: 95 fL (ref 79–97)
MONOS ABS: 0.5 10*3/uL (ref 0.1–0.9)
Monocytes: 6 %
NEUTROS ABS: 5.5 10*3/uL (ref 1.4–7.0)
Neutrophils Relative %: 71 %
Platelets: 224 10*3/uL (ref 150–379)
RBC: 3.71 x10E6/uL — ABNORMAL LOW (ref 3.77–5.28)
RDW: 16.8 % — ABNORMAL HIGH (ref 12.3–15.4)
WBC: 7.9 10*3/uL (ref 3.4–10.8)

## 2013-11-07 LAB — PROTIME-INR
INR: 1.4 — ABNORMAL HIGH (ref 0.8–1.2)
Prothrombin Time: 14.6 s — ABNORMAL HIGH (ref 9.1–12.0)

## 2013-11-07 NOTE — Telephone Encounter (Signed)
Informed patient of cardioversion date

## 2013-11-07 NOTE — Telephone Encounter (Signed)
Message copied by Fransico Setters on Wed Nov 07, 2013  4:59 PM ------      Message from: [name] A      Created: Wed Nov 07, 2013 11:59 AM       Pre cardioversion labs showed mild dehydration. Stop Lasix. Increase fluid intake. Avoid NSAIDs. ------

## 2013-11-07 NOTE — Telephone Encounter (Signed)
Please call patient. She is confused about her cardioversion date.

## 2013-11-08 ENCOUNTER — Telehealth: Payer: Self-pay | Admitting: *Deleted

## 2013-11-08 NOTE — Telephone Encounter (Signed)
Faxed cardioversion orders to [facility] conformed receipt

## 2013-11-09 ENCOUNTER — Ambulatory Visit: Payer: Self-pay | Admitting: Cardiovascular Disease

## 2013-11-09 DIAGNOSIS — I4891 Unspecified atrial fibrillation: Secondary | ICD-10-CM

## 2013-11-12 ENCOUNTER — Encounter: Payer: Self-pay | Admitting: Physician Assistant

## 2013-11-12 ENCOUNTER — Telehealth: Payer: Self-pay

## 2013-11-12 ENCOUNTER — Ambulatory Visit (INDEPENDENT_AMBULATORY_CARE_PROVIDER_SITE_OTHER): Payer: BC Managed Care – PPO | Admitting: Physician Assistant

## 2013-11-12 VITALS — BP 148/98 | HR 62 | Ht 63.0 in | Wt 283.5 lb

## 2013-11-12 DIAGNOSIS — R5383 Other fatigue: Secondary | ICD-10-CM

## 2013-11-12 DIAGNOSIS — R0989 Other specified symptoms and signs involving the circulatory and respiratory systems: Secondary | ICD-10-CM

## 2013-11-12 DIAGNOSIS — R0602 Shortness of breath: Secondary | ICD-10-CM

## 2013-11-12 DIAGNOSIS — I4891 Unspecified atrial fibrillation: Secondary | ICD-10-CM

## 2013-11-12 DIAGNOSIS — R5381 Other malaise: Secondary | ICD-10-CM

## 2013-11-12 DIAGNOSIS — R0609 Other forms of dyspnea: Secondary | ICD-10-CM

## 2013-11-12 NOTE — Patient Instructions (Signed)
[facility] MYOVIEW  Your caregiver has ordered a Stress Test with nuclear imaging. The purpose of this test is to evaluate the blood supply to your heart muscle. This procedure is referred to as a "Non-Invasive Stress Test." This is because other than having an IV started in your vein, nothing is inserted or "invades" your body. Cardiac stress tests are done to find areas of poor blood flow to the heart by determining the extent of coronary artery disease (CAD). Some patients exercise on a treadmill, which naturally increases the blood flow to your heart, while others who are  unable to walk on a treadmill due to physical limitations have a pharmacologic/chemical stress agent called Lexiscan . This medicine will mimic walking on a treadmill by temporarily increasing your coronary blood flow.   Please note: these test may take anywhere between 2-4 hours to complete  PLEASE REPORT TO [facility] MEDICAL MALL ENTRANCE  THE VOLUNTEERS AT THE FIRST DESK WILL DIRECT YOU WHERE TO GO  Date of Procedure:__________7/22/15___________________________  Arrival Time for Procedure:_____1015 am_________________________  Instructions regarding medication:     __x__:  Hold betablocker(s) night before procedure and morning of procedure    PLEASE NOTIFY THE OFFICE AT LEAST 24 HOURS IN ADVANCE IF YOU ARE UNABLE TO KEEP YOUR APPOINTMENT.  [phone] AND  PLEASE NOTIFY NUCLEAR MEDICINE AT [facility] AT LEAST 24 HOURS IN ADVANCE IF YOU ARE UNABLE TO KEEP YOUR APPOINTMENT. [phone]  How to prepare for your Myoview test:  1. Do not eat or drink after midnight 2. No caffeine for 24 hours prior to test 3. No smoking 24 hours prior to test. 4. Your medication may be taken with water.  If your doctor stopped a medication because of this test, do not take that medication. 5. Ladies, please do not wear dresses.  Skirts or pants are appropriate. Please wear a short sleeve shirt. 6. No perfume, cologne or lotion. 7. Wear  comfortable walking shoes. No heels!   Your physician recommends that you schedule a follow-up appointment in:  After your Lexiscan with Eula Listen PA

## 2013-11-12 NOTE — Telephone Encounter (Signed)
Patient scheduled to see Eula Listen PA today  Instructed patient to contact EMS if she feels her situation becomes emergent before her appt time  Patient verbalized understanding

## 2013-11-12 NOTE — Progress Notes (Signed)
[address] [place], [place] [address]  Date:  11/12/2013   Patient ID:  Stacey [name], DOB 05/26/1950, MRN [id number]   PCP:  Raliegh IpPHILLIPS,CHARLES W, MD  Cardiologist:  Dr. Kirke CorinArida, MD       Patient Profile: 63 y.o. female with history of likely OSA and new onset a fib (May 2015) s/p cardioversion 09/28/2013 and 11/09/2013 who presents with continued SOB, dizziness, and fatigue.    Problem List:  Past Medical History  Diagnosis Date  . Chronic diastolic CHF (congestive heart failure)     a. Dx 08/2013 in setting of rapid afib;  b. 08/2013 Echo: EF 55-60%, mildly dil LA, nl RV.  Marland Kitchen. Atrial fibrillation     a. Dx 08/2013. CHA2DS2VASc = 3 (diast chf, htn, female) -->Xarelto initiated.  . Obesity   . GERD (gastroesophageal reflux disease)   . Hypertension   . Depression   . Presumed Obstructive Sleep Apnea     a. needs outpt sleep eval.   Past Surgical History  Procedure Laterality Date  . Tonsillectomy    . Ectopic pregnancy surgery       Allergies:  Allergies  Allergen Reactions  . Levaquin [Levofloxacin In D5w]     Dizziness   . Other Other (See Comments)     History of Present Illness: 63 y.o. female with the above problem list including likely OSA (not diagnosed or treated) and new onset a fib (May 2015) s/p cardioversion 09/28/2013 and 11/09/2013 who has had continued SOB, DOE, fatigue since her a fib began.   When she went into the hospital back on Aug 27, 2013 with SOB/DOE and fatigue she was found to be tachycardic and be in new onset a fib with RVR with a rate of 157. She was treated with cardizem drip and heparin. With that her rates improved to 90s-110s and she felt better. She was less SOB and less tired. Thought process was possible uncontrolled OSA may be precipitating factor in her a fib. She has an appointment with Dr. Welton FlakesKhan 11/13/2013 for initial evaluation of this.   Echo in the hospital revealed: 1. Rhythm is atrial fibrillation. 2. Left ventricular  ejection fraction, by visual estimation, is 55 to 60%. 3. Normal global left ventricular systolic function. 4. Mildly dilated left atrium (4.1 cm). 5. Normal right ventricular size and systolic function. 6. Normal RVSP.  Since then she underwent successful cardioversion on 09/28/2013 and had follow up on 10/08/2013. Plan was to continue diltiazem, metoprolol, and xarelto. Follow up regarding OSA evaluation.   On 11/06/2013 it sounds like she went back into a fib with rates ranging from 80-140, SOB, sat's dropping into the 80's (per patient.) She was added on to Dr. Jari SportsmanArida's schedule that day and found to be back in a fib with RVR. Vitals were stable. BP 139/88, pulse 113. She was quite symptomatic with dyspnea with her palpitations. She had not missed any doses of her xeralto. She was scheduled for another cardioversion on 11/09/2013.   On 11/09/2013 she underwent successful cardioversion and remains in NSR, 62 with TWI in leads II, III, aVF, v2-v6 (seen on prior ekg 09/06/2013.)  Today she comes in stating she has continued to feel SOB, had DOE, felt extreme fatigue to the point of having to lean against the wall while at work because she is so tired. 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 She denies any chest pain,  cough, nausea, vomiting, diaphoresis, weight gain, early satiety, or increase in lower extremity edema. Clothes continue to fit the same.   TSH checked in hospital: 3.04       Home Medications:  Prior to Admission medications   Medication Sig Start Date End Date Taking? Authorizing Provider  esomeprazole (NEXIUM) 40 MG capsule Take 1 capsule (40 mg total) by mouth every morning. 09/28/13  Yes Iran Ouch, MD  flecainide (TAMBOCOR) 50 MG tablet Take 1 tablet (50 mg total) by mouth 2 (two) times daily. 11/06/13  Yes Iran Ouch, MD  furosemide (LASIX) 20 MG tablet Take 20 mg by mouth daily.   Yes Historical  Provider, MD  metoprolol (LOPRESSOR) 50 MG tablet Take 1 tablet (50 mg total) by mouth 2 (two) times daily. 11/06/13  Yes Iran Ouch, MD  naproxen sodium (ANAPROX) 220 MG tablet Take 220 mg by mouth as needed.    Yes Historical Provider, MD  rivaroxaban (XARELTO) 20 MG TABS tablet Take 1 tablet (20 mg total) by mouth daily with supper. 09/28/13  Yes Iran Ouch, MD  venlafaxine (EFFEXOR) 37.5 MG tablet Take 37.5 mg by mouth daily.   Yes Historical Provider, MD  zolpidem (AMBIEN) 10 MG tablet Take 10 mg by mouth at bedtime as needed.    Yes Historical Provider, MD     Family History:  The patient's family history includes CAD in her father; Heart attack in her father; Hypertension in her mother.    Social History:  The patient  reports that she has never smoked. She does not have any smokeless tobacco history on file. She reports that she drinks alcohol. She reports that she does not use illicit drugs.   Recent Labs: 11/06/2013: Creatinine 1.67*; Hemoglobin 11.1; Potassium 4.1    Weights: Filed Weights   11/12/13 1435  Weight: 283 lb 8 oz (128.595 kg)       ROS:  Please see the history of present illness. All other systems reviewed and negative.    PHYSICAL EXAM:  VS:  BP 148/98  Pulse 62  Ht 5\' 3"  (1.6 m)  Wt 283 lb 8 oz (128.595 kg)  BMI 50.23 kg/m2  SpO2 92% Well nourished, well developed, in no acute distress HEENT: normal Neck: no JVD Cardiac:  normal S1, S2; RRR; no murmur Lungs:  clear to auscultation bilaterally, no wheezing, rhonchi or rales Abd: soft, nontender, no hepatomegaly Ext: trace non-pitting edema bilaterally to the ankles  Skin: warm and dry Neuro:  moves all extremities spontaneously, no focal abnormalities noted  EKG:  NSR, 62, TWI II, III, aVF, v2-v6 (seen on 09/06/2013)  ASSESSMENT AND PLAN:  1. Atrial fibrillation: Status post cardioversion x 2 (09/28/2013 and 11/09/2013.) Now back in NSR. Continues to have SOB/DOE/fatigue. Discussed side  effects of medications. Continue xarelto.     2. SOB/DOE/fatigue: She denies missing any xarelto doses. Will set up patient for [facility]. EKG with no acute changes. Possibility her symptoms are related to her possible OSA/deconditioning, but must rule out other etiologies first.   3. Hypertension: Usually much better controlled. Continue current therapy. Monitor.   4. Presumed OSA: Has initial appointment with Dr. Welton Flakes 11/13/2013. Likely contributing to her recurrent a fib, SOB/DOE/fatigue.    Eula Listen, MHS, PA-C [facility] [address] [place], [place] [address] [phone]

## 2013-11-12 NOTE — Telephone Encounter (Signed)
Pt states she had cardioversion, states she went back to work today, is feeling lightheaded, very SOB. Please call.

## 2013-11-14 ENCOUNTER — Ambulatory Visit: Payer: Self-pay | Admitting: Cardiovascular Disease

## 2013-11-14 DIAGNOSIS — R079 Chest pain, unspecified: Secondary | ICD-10-CM

## 2013-11-15 ENCOUNTER — Telehealth: Payer: Self-pay

## 2013-11-15 ENCOUNTER — Other Ambulatory Visit: Payer: Self-pay

## 2013-11-15 ENCOUNTER — Ambulatory Visit: Payer: Self-pay | Admitting: Physician Assistant

## 2013-11-15 DIAGNOSIS — R079 Chest pain, unspecified: Secondary | ICD-10-CM

## 2013-11-15 DIAGNOSIS — I4891 Unspecified atrial fibrillation: Secondary | ICD-10-CM

## 2013-11-15 DIAGNOSIS — R9439 Abnormal result of other cardiovascular function study: Secondary | ICD-10-CM

## 2013-11-15 DIAGNOSIS — Z01812 Encounter for preprocedural laboratory examination: Secondary | ICD-10-CM

## 2013-11-15 NOTE — Telephone Encounter (Signed)
Pt would like stress test results.  

## 2013-11-15 NOTE — Telephone Encounter (Signed)
[facility] Cardiac Cath Instructions   You are scheduled for a Cardiac Cath on:____7/24/15_____________________  Please arrive at _______am on the day of your procedure  You will need to pre-register prior to the day of your procedure.  Enter through the [employer] at [facility].  Registration is the first desk on your right.  Please take the procedure order we have given you in order to be registered appropriately  Do not eat/drink anything after midnight  Someone will need to drive you home  It is recommended someone be with you for the first 24 hours after your procedure  Wear clothes that are easy to get on/off and wear slip on shoes if possible   Medications bring a current list of all medications with you  _x__ You may take all of your medications the morning of your procedure with enough water to swallow safely    Day of your procedure: Arrive at the Medical Mall entrance.  Free valet service is available.  After entering the Medical Mall please check-in at the registration desk (1st desk on your right) to receive your armband. After receiving your armband someone will escort you to the cardiac cath/special procedures waiting area.  The usual length of stay after your procedure is about 2 to 3 hours.  This can vary.  If you have any questions, please call our office at [phone], or you may call the cardiac cath lab at [facility] directly at [phone]   Your physician has recommended you make the following change in your medication:  Stop Flecainide   Your physician recommends that you return for lab work in:  Take your lab and chest x ray order to [facility] to be done today

## 2013-11-15 NOTE — Telephone Encounter (Signed)
Message copied by Fransico Setters on Thu Nov 15, 2013 12:11 PM ------      Message from: Sondra Barges      Created: Thu Nov 15, 2013 11:50 AM       Please call the patient. Case discussed with Dr. Mariah Milling. She needs a cath. He can do this tomorrow, 11/16/13 first thing. Please call her to let her know this. She needs labs and CXR. Please also have her stop her flecainide. Once she is agreeable to the cath I will place the order, please text me to let me know that she is ok with cath so I can place the order. ------

## 2013-11-15 NOTE — Telephone Encounter (Signed)
Spoke w/ pt.  She verbalizes understanding of instructions to arrive at Medical Mall entrance of [facility] tomorrow at 6:30am for 7:30 cath.  She will come by this afternoon to pick up instructions and order for chest x-ray to be done today.

## 2013-11-16 ENCOUNTER — Telehealth: Payer: Self-pay | Admitting: *Deleted

## 2013-11-16 NOTE — Telephone Encounter (Signed)
Patient informed that cardiac cath has been rescheduled to 11/20/13 at 12:30 am  Patient to arrive at 11:30 am  Hold Xarelto for 48 hrs in advance  Patient verbalized understanding

## 2013-11-19 ENCOUNTER — Telehealth: Payer: Self-pay | Admitting: *Deleted

## 2013-11-19 NOTE — Telephone Encounter (Signed)
Faxed cath orders to [facility]  Zella Ball confirmed receipt

## 2013-11-20 ENCOUNTER — Ambulatory Visit: Payer: Self-pay | Admitting: Cardiovascular Disease

## 2013-11-20 DIAGNOSIS — R079 Chest pain, unspecified: Secondary | ICD-10-CM

## 2013-11-23 ENCOUNTER — Encounter: Payer: Self-pay | Admitting: *Deleted

## 2013-11-23 ENCOUNTER — Ambulatory Visit (INDEPENDENT_AMBULATORY_CARE_PROVIDER_SITE_OTHER): Payer: BC Managed Care – PPO | Admitting: Cardiovascular Disease

## 2013-11-23 VITALS — BP 148/100 | HR 125 | Ht 63.0 in | Wt 268.5 lb

## 2013-11-23 DIAGNOSIS — I5032 Chronic diastolic (congestive) heart failure: Secondary | ICD-10-CM

## 2013-11-23 DIAGNOSIS — I2510 Atherosclerotic heart disease of native coronary artery without angina pectoris: Secondary | ICD-10-CM | POA: Insufficient documentation

## 2013-11-23 DIAGNOSIS — I2584 Coronary atherosclerosis due to calcified coronary lesion: Secondary | ICD-10-CM

## 2013-11-23 DIAGNOSIS — I4891 Unspecified atrial fibrillation: Secondary | ICD-10-CM

## 2013-11-23 DIAGNOSIS — Z01812 Encounter for preprocedural laboratory examination: Secondary | ICD-10-CM

## 2013-11-23 DIAGNOSIS — G4733 Obstructive sleep apnea (adult) (pediatric): Secondary | ICD-10-CM

## 2013-11-23 DIAGNOSIS — I509 Heart failure, unspecified: Secondary | ICD-10-CM

## 2013-11-23 NOTE — Assessment & Plan Note (Signed)
This is likely due to atrial fibrillation as well as untreated sleep apnea. LVEDP was elevated during catheterization but she was in atrial fibrillation with rapid ventricular response. Continue current dose of Lasix.

## 2013-11-23 NOTE — Patient Instructions (Signed)
Your physician has recommended that you have a Cardioversion (DCCV) on 12/13/13 at 0730 am. Please arrive at 0630 am. Electrical Cardioversion uses a jolt of electricity to your heart either through paddles or wired patches attached to your chest. This is a controlled, usually prescheduled, procedure. Defibrillation is done under light anesthesia in the hospital, and you usually go home the day of the procedure. This is done to get your heart back into a normal rhythm. You are not awake for the procedure. Please see the instruction sheet given to you today.   Your physician recommends that you return for lab work and EKG on 8/17 or 8/19.

## 2013-11-23 NOTE — Assessment & Plan Note (Signed)
She is going to have a sleep study next week.

## 2013-11-23 NOTE — Assessment & Plan Note (Signed)
There was mildly calcified coronary arteries with mild disease over all. She will require a fasting lipid profile.

## 2013-11-23 NOTE — Assessment & Plan Note (Signed)
The patient has recurrent atrial fibrillation after flecainide was held. Ventricular rate is high. Thus, I increased the dose of metoprolol to 75 mg twice daily. She is significantly symptomatic with atrial fibrillation with significant exertional dizziness, shortness of breath and palpitations. I discussed management options including proceeding with TEE guided cardioversion versus waiting 3 weeks of anticoagulation on proceeding with cardioversion alone. She does not feel extremely symptomatic and prefers to wait. Cardioversion was scheduled.

## 2013-11-23 NOTE — Progress Notes (Signed)
HPI  63 y/o female who is here today for a followup visit regarding atrial fibrillation. She has known history of  HTN and depression.  She was hospitalized at [facility] in May with atrial fibrillation with RVR.  She had mild diastolic heart failure and was placed on oral lasix.  Echo showed nl LV fxn and a mildly dilated LA with no significant LVH.  With a CHA2DS2VASc of 3 (diast chf, htn, female), decision was made to initiate long term anticoagulation and xarelto 20mg  daily was started.   She underwent successful cardioversion in June.  She was seen again in July with recurrent atrial fibrillation with rapid ventricular response. Diltiazem was discontinued and she was started on flecainide 50 mg twice daily. Successful cardioversion was performed. Upon followup, she complained of fatigue and shortness of breath. Thus, she underwent a nuclear stress test which was suggestive of anterior wall ischemia. Flecainide was held. I proceeded with cardiac catheterization which showed mild nonobstructive coronary artery disease. She continues to feel fatigued. No chest pain. She does have shortness of breath as well. She saw Dr. Welton Flakes and is going to have a sleep study done.  Allergies  Allergen Reactions  . Levaquin [Levofloxacin In D5w]     Dizziness   . Other Other (See Comments)     Current Outpatient Prescriptions on File Prior to Visit  Medication Sig Dispense Refill  . esomeprazole (NEXIUM) 40 MG capsule Take 1 capsule (40 mg total) by mouth every morning.  30 capsule  3  . furosemide (LASIX) 20 MG tablet Take 20 mg by mouth daily.      . metoprolol (LOPRESSOR) 50 MG tablet Take 1 tablet (50 mg total) by mouth 2 (two) times daily.  180 tablet  3  . naproxen sodium (ANAPROX) 220 MG tablet Take 220 mg by mouth as needed.       . rivaroxaban (XARELTO) 20 MG TABS tablet Take 1 tablet (20 mg total) by mouth daily with supper.  30 tablet  3  . venlafaxine (EFFEXOR) 37.5 MG tablet Take 37.5 mg by mouth  daily.      Marland Kitchen zolpidem (AMBIEN) 10 MG tablet Take 10 mg by mouth at bedtime as needed.        No current facility-administered medications on file prior to visit.     Past Medical History  Diagnosis Date  . Chronic diastolic CHF (congestive heart failure)     a. Dx 08/2013 in setting of rapid afib;  b. 08/2013 Echo: EF 55-60%, mildly dil LA, nl RV.  Marland Kitchen Atrial fibrillation     a. Dx 08/2013. CHA2DS2VASc = 3 (diast chf, htn, female) -->Xarelto initiated.  . Obesity   . GERD (gastroesophageal reflux disease)   . Hypertension   . Depression   . Presumed Obstructive Sleep Apnea     a. needs outpt sleep eval.     Past Surgical History  Procedure Laterality Date  . Tonsillectomy    . Ectopic pregnancy surgery    . Cardiac catheterization  10/2013    [facility]     Family History  Problem Relation Age of Onset  . CAD Father     s/p cabg - ? h/o afib.  Marland Kitchen Heart attack Father   . Hypertension Mother      History   Social History  . Marital Status: Married    Spouse Name: N/A    Number of Children: N/A  . Years of Education: N/A   Occupational History  .  Not on file.   Social History Main Topics  . Smoking status: Never Smoker   . Smokeless tobacco: Not on file  . Alcohol Use: Yes     Comment: 1 glass of wine/day.  . Drug Use: No  . Sexual Activity: Not on file   Other Topics Concern  . Not on file   Social History Narrative   [place] in [place].  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      PHYSICAL EXAM   BP 148/100  Pulse 125  Ht 5\' 3"  (1.6 m)  Wt 268 lb 8 oz (121.791 kg)  BMI 47.57 kg/m2 Constitutional: She is oriented to person, place, and time. She appears well-developed and well-nourished. No distress.  HENT: No nasal discharge.  Head: Normocephalic and atraumatic.  Eyes: Pupils are equal and round. No discharge.  Neck: Normal range of motion. Neck supple. No JVD present. No thyromegaly present.  Cardiovascular: Tachycardic, irregular rhythm, normal heart sounds.  Exam reveals no gallop and no friction rub. No murmur heard.  Pulmonary/Chest: Effort normal and breath sounds normal. No stridor. No respiratory distress. She has no wheezes. She has no rales. She exhibits no tenderness.  Abdominal: Soft. Bowel sounds are normal. She exhibits no distension. There is no tenderness. There is no rebound and no guarding.  Musculoskeletal: Normal range of motion. She exhibits no edema and no tenderness.  Neurological: She is alert and oriented to person, place, and time. Coordination normal.  Skin: Skin is warm and dry. No rash noted. She is not diaphoretic. No erythema. No pallor.  Psychiatric: She has a normal mood and affect. Her behavior is normal. Judgment and thought content normal.     EKG: Atrial fibrillation  -Poor R-wave progression -may be secondary to pulmonary disease    -  Nonspecific T-abnormality.   Low voltage -possible pulmonary disease.   ABNORMAL    ASSESSMENT AND PLAN

## 2013-12-10 ENCOUNTER — Ambulatory Visit (INDEPENDENT_AMBULATORY_CARE_PROVIDER_SITE_OTHER): Payer: BC Managed Care – PPO | Admitting: *Deleted

## 2013-12-10 ENCOUNTER — Ambulatory Visit: Payer: BC Managed Care – PPO | Admitting: *Deleted

## 2013-12-10 ENCOUNTER — Other Ambulatory Visit: Payer: BC Managed Care – PPO

## 2013-12-10 VITALS — HR 127 | Ht 63.0 in | Wt 281.8 lb

## 2013-12-10 DIAGNOSIS — Z01812 Encounter for preprocedural laboratory examination: Secondary | ICD-10-CM

## 2013-12-10 DIAGNOSIS — I4891 Unspecified atrial fibrillation: Secondary | ICD-10-CM

## 2013-12-11 LAB — BASIC METABOLIC PANEL
BUN / CREAT RATIO: 20 (ref 11–26)
BUN: 31 mg/dL — ABNORMAL HIGH (ref 8–27)
CALCIUM: 9.6 mg/dL (ref 8.7–10.3)
CO2: 23 mmol/L (ref 18–29)
CREATININE: 1.57 mg/dL — AB (ref 0.57–1.00)
Chloride: 104 mmol/L (ref 97–108)
GFR calc Af Amer: 41 mL/min/{1.73_m2} — ABNORMAL LOW (ref >59)
GFR calc non Af Amer: 35 mL/min/{1.73_m2} — ABNORMAL LOW (ref >59)
Glucose: 113 mg/dL — ABNORMAL HIGH (ref 65–99)
Potassium: 4.7 mmol/L (ref 3.5–5.2)
Sodium: 143 mmol/L (ref 134–144)

## 2013-12-11 LAB — CBC WITH DIFFERENTIAL
BASOS ABS: 0 10*3/uL (ref 0.0–0.2)
Basos: 0 %
EOS: 2 %
Eosinophils Absolute: 0.1 10*3/uL (ref 0.0–0.4)
HCT: 39.7 % (ref 34.0–46.6)
Hemoglobin: 12.5 g/dL (ref 11.1–15.9)
IMMATURE GRANS (ABS): 0 10*3/uL (ref 0.0–0.1)
IMMATURE GRANULOCYTES: 0 %
Lymphocytes Absolute: 1.4 10*3/uL (ref 0.7–3.1)
Lymphs: 19 %
MCH: 30.2 pg (ref 26.6–33.0)
MCHC: 31.5 g/dL (ref 31.5–35.7)
MCV: 96 fL (ref 79–97)
MONOS ABS: 0.6 10*3/uL (ref 0.1–0.9)
Monocytes: 8 %
NEUTROS PCT: 71 %
Neutrophils Absolute: 5.3 10*3/uL (ref 1.4–7.0)
PLATELETS: 222 10*3/uL (ref 150–379)
RBC: 4.14 x10E6/uL (ref 3.77–5.28)
RDW: 16.3 % — AB (ref 12.3–15.4)
WBC: 7.5 10*3/uL (ref 3.4–10.8)

## 2013-12-11 LAB — PROTIME-INR
INR: 1.5 — ABNORMAL HIGH (ref 0.8–1.2)
Prothrombin Time: 15.9 s — ABNORMAL HIGH (ref 9.1–12.0)

## 2013-12-12 ENCOUNTER — Telehealth: Payer: Self-pay | Admitting: *Deleted

## 2013-12-12 NOTE — Telephone Encounter (Signed)
Cardioversion orders faxed  Jasmine December confirmed receipt

## 2013-12-13 ENCOUNTER — Ambulatory Visit: Payer: Self-pay | Admitting: Cardiovascular Disease

## 2013-12-13 DIAGNOSIS — I4891 Unspecified atrial fibrillation: Secondary | ICD-10-CM

## 2013-12-14 ENCOUNTER — Telehealth: Payer: Self-pay | Admitting: *Deleted

## 2013-12-14 MED ORDER — FLECAINIDE ACETATE 50 MG PO TABS: 100.0000 mg | ORAL_TABLET | Freq: Two times a day (BID) | ORAL | Status: DC

## 2013-12-14 NOTE — Telephone Encounter (Signed)
Spoke with patient  Informed her that Dr. Kirke Corin instructed her to increase her Flecainide to 100 mg twice daily  Patient verbalized understanding

## 2013-12-14 NOTE — Telephone Encounter (Signed)
Medication change 

## 2013-12-20 ENCOUNTER — Ambulatory Visit (INDEPENDENT_AMBULATORY_CARE_PROVIDER_SITE_OTHER): Payer: BC Managed Care – PPO | Admitting: Cardiovascular Disease

## 2013-12-20 ENCOUNTER — Encounter: Payer: Self-pay | Admitting: Cardiovascular Disease

## 2013-12-20 ENCOUNTER — Encounter: Payer: Self-pay | Admitting: *Deleted

## 2013-12-20 VITALS — BP 123/83 | HR 60 | Ht 63.0 in | Wt 288.8 lb

## 2013-12-20 DIAGNOSIS — I509 Heart failure, unspecified: Secondary | ICD-10-CM

## 2013-12-20 DIAGNOSIS — I5032 Chronic diastolic (congestive) heart failure: Secondary | ICD-10-CM

## 2013-12-20 DIAGNOSIS — G4730 Sleep apnea, unspecified: Secondary | ICD-10-CM | POA: Insufficient documentation

## 2013-12-20 DIAGNOSIS — I10 Essential (primary) hypertension: Secondary | ICD-10-CM

## 2013-12-20 DIAGNOSIS — I4891 Unspecified atrial fibrillation: Secondary | ICD-10-CM

## 2013-12-20 MED ORDER — FUROSEMIDE 40 MG PO TABS: 40.0000 mg | ORAL_TABLET | Freq: Every day | ORAL | Status: DC

## 2013-12-20 MED ORDER — FLECAINIDE ACETATE 100 MG PO TABS
100.0000 mg | ORAL_TABLET | Freq: Two times a day (BID) | ORAL | Status: DC
Start: 2013-12-20 — End: 2014-02-01

## 2013-12-20 MED ORDER — METOPROLOL TARTRATE 50 MG PO TABS: 50.0000 mg | ORAL_TABLET | Freq: Two times a day (BID) | ORAL | Status: DC

## 2013-12-20 NOTE — Assessment & Plan Note (Signed)
There is evidence of significant fluid overload since last visit. Her weight is up 20 pounds. I increased the Lasix to 40 mg once daily. I don't believe she can go back to work before  optimizing her fluid status. I will have her followup with me in one week and check basic metabolic profile at that time.

## 2013-12-20 NOTE — Assessment & Plan Note (Signed)
Blood pressure is well controlled on medications. 

## 2013-12-20 NOTE — Assessment & Plan Note (Signed)
She recently started using the CPAP machine.

## 2013-12-20 NOTE — Patient Instructions (Addendum)
Your physician has recommended you make the following change in your medication:  Decrease Metoprolol to 50 mg twice daily Increase Lasix to 40 mg once daily  Your Flecainide has been reordered in 100 mg tablets   Your physician recommends that you schedule a follow-up appointment in:  1 week

## 2013-12-20 NOTE — Assessment & Plan Note (Signed)
She is maintaining in sinus rhythm with flecainide and recent cardioversion. She is slightly bradycardic and having more symptoms of diastolic heart failure. Thus, I decreased the dose of metoprolol to 50 mg twice daily. Continue anticoagulation.

## 2013-12-20 NOTE — Progress Notes (Signed)
HPI  63 y/o female who is here today for a followup visit regarding persistent atrial fibrillation. She has known history of  HTN and depression.  She was hospitalized at [facility] in May with atrial fibrillation with RVR.  She had mild diastolic heart failure and was placed on oral lasix.  Echo showed nl LV fxn and a mildly dilated LA with no significant LVH.  With a CHA2DS2VASc of 3 (diast chf, htn, female), decision was made to initiate long term anticoagulation and xarelto  daily was started.   She underwent successful cardioversion in June.  She was seen again in July with recurrent atrial fibrillation with rapid ventricular response. Diltiazem was discontinued and she was started on flecainide 50 mg twice daily. Successful cardioversion was performed. Upon followup, she complained of fatigue and shortness of breath. Thus, she underwent a nuclear stress test which was suggestive of anterior wall ischemia. Flecainide was held. I proceeded with cardiac catheterization which showed mild nonobstructive coronary artery disease.  She developed atrial fibrillation with rapid ventricular response again. Flecainide was resumed. She underwent recent successful cardioversion. No palpitations since then. However, she reports worsening dyspnea, orthopnea, edema and weight gain. She has been feeling nauseous.  Allergies  Allergen Reactions  . Levaquin [Levofloxacin In D5w]     Dizziness   . Other Other (See Comments)     Current Outpatient Prescriptions on File Prior to Visit  Medication Sig Dispense Refill  . esomeprazole (NEXIUM) 40 MG capsule Take 1 capsule (40 mg total) by mouth every morning.  30 capsule  3  . flecainide (TAMBOCOR) 50 MG tablet Take 2 tablets (100 mg total) by mouth 2 (two) times daily.  60 tablet  6  . furosemide (LASIX) 20 MG tablet Take 20 mg by mouth daily.      . naproxen sodium (ANAPROX) 220 MG tablet Take 220 mg by mouth as needed.       . rivaroxaban (XARELTO) 20 MG TABS  tablet Take 1 tablet (20 mg total) by mouth daily with supper.  30 tablet  3  . venlafaxine (EFFEXOR) 37.5 MG tablet Take 37.5 mg by mouth daily.      Marland Kitchen zolpidem (AMBIEN) 10 MG tablet Take 10 mg by mouth at bedtime as needed.        No current facility-administered medications on file prior to visit.     Past Medical History  Diagnosis Date  . Chronic diastolic CHF (congestive heart failure)     a. Dx 08/2013 in setting of rapid afib;  b. 08/2013 Echo: EF 55-60%, mildly dil LA, nl RV.  Marland Kitchen Atrial fibrillation     a. Dx 08/2013. CHA2DS2VASc = 3 (diast chf, htn, female) -->Xarelto initiated.  . Obesity   . GERD (gastroesophageal reflux disease)   . Hypertension   . Depression   . Presumed Obstructive Sleep Apnea     a. needs outpt sleep eval.  . Coronary artery disease     Cardiac catheterization in July of 2015 showed mild three-vessel coronary artery disease with 20% mid LAD stenosis, 20% ostial left circumflex stenosis and 20% mid RCA stenosis. Coronary arteries were mildly calcified.     Past Surgical History  Procedure Laterality Date  . Tonsillectomy    . Ectopic pregnancy surgery    . Cardiac catheterization  10/2013    [facility]     Family History  Problem Relation Age of Onset  . CAD Father     s/p cabg - ? h/o afib.  Marland Kitchen  Heart attack Father   . Hypertension Mother      History   Social History  . Marital Status: Married    Spouse Name: N/A    Number of Children: N/A  . Years of Education: N/A   Occupational History  . Not on file.   Social History Main Topics  . Smoking status: Never Smoker   . Smokeless tobacco: Not on file  . Alcohol Use: Yes     Comment: 1 glass of wine/day.  . Drug Use: No  . Sexual Activity: Not on file   Other Topics Concern  . Not on file   Social History Narrative   Lives in [place].  Works as Water quality scientist.      PHYSICAL EXAM   BP 123/83  Pulse 60  Ht 5\' 3"  (1.6 m)  Wt 288 lb 12 oz (130.976 kg)  BMI 51.16  kg/m2 Constitutional: She is oriented to person, place, and time. She appears well-developed and well-nourished. No distress.  HENT: No nasal discharge.  Head: Normocephalic and atraumatic.  Eyes: Pupils are equal and round. No discharge.  Neck: Normal range of motion. Neck supple. No JVD present. No thyromegaly present.  Cardiovascular: Normal rate, regular rhythm, normal heart sounds. Exam reveals no gallop and no friction rub. No murmur heard.  Pulmonary/Chest: Effort normal and breath sounds normal. No stridor. No respiratory distress. She has no wheezes. She has no rales. She exhibits no tenderness.  Abdominal: Soft. Bowel sounds are normal. She exhibits no distension. There is no tenderness. There is no rebound and no guarding.  Musculoskeletal: Normal range of motion. She exhibits no edema and no tenderness.  Neurological: She is alert and oriented to person, place, and time. Coordination normal.  Skin: Skin is warm and dry. No rash noted. She is not diaphoretic. No erythema. No pallor.  Psychiatric: She has a normal mood and affect. Her behavior is normal. Judgment and thought content normal.     EKG: Sinus  Rhythm  Diffuse low voltage.   -Prominent R(V1) and right axis -consider right ventricular hypertrophy  -consider pulmonary disease.   -Anterior infarct -age undetermined.   ABNORMAL    ASSESSMENT AND PLAN

## 2013-12-24 ENCOUNTER — Ambulatory Visit: Payer: BC Managed Care – PPO | Admitting: Cardiovascular Disease

## 2013-12-27 ENCOUNTER — Ambulatory Visit (INDEPENDENT_AMBULATORY_CARE_PROVIDER_SITE_OTHER): Payer: BC Managed Care – PPO | Admitting: Cardiovascular Disease

## 2013-12-27 ENCOUNTER — Encounter: Payer: Self-pay | Admitting: *Deleted

## 2013-12-27 ENCOUNTER — Encounter: Payer: Self-pay | Admitting: Cardiovascular Disease

## 2013-12-27 VITALS — BP 148/60 | HR 81 | Ht 63.0 in | Wt 274.8 lb

## 2013-12-27 DIAGNOSIS — I4891 Unspecified atrial fibrillation: Secondary | ICD-10-CM

## 2013-12-27 DIAGNOSIS — G4730 Sleep apnea, unspecified: Secondary | ICD-10-CM

## 2013-12-27 DIAGNOSIS — I509 Heart failure, unspecified: Secondary | ICD-10-CM

## 2013-12-27 DIAGNOSIS — I10 Essential (primary) hypertension: Secondary | ICD-10-CM

## 2013-12-27 DIAGNOSIS — I5032 Chronic diastolic (congestive) heart failure: Secondary | ICD-10-CM

## 2013-12-27 NOTE — Patient Instructions (Signed)
Your physician recommends that you have labs today: BMP  Your physician recommends that you schedule a follow-up appointment in:  3 months with Dr. Kirke Corin

## 2013-12-27 NOTE — Assessment & Plan Note (Signed)
Blood pressure is reasonably controlled on current medications. 

## 2013-12-27 NOTE — Assessment & Plan Note (Signed)
Continue treatment with CPAP. 

## 2013-12-27 NOTE — Assessment & Plan Note (Signed)
She had significant improvement after increasing the dose of furosemide. Check basic metabolic profile today. She can resume work next week without restrictions.

## 2013-12-27 NOTE — Progress Notes (Signed)
HPI  63 y/o female who is here today for a followup visit regarding persistent atrial fibrillation. She has known history of  HTN and depression.  She was hospitalized at [facility] in May with atrial fibrillation with RVR.  She had mild diastolic heart failure and was placed on oral lasix.  Echo showed nl LV fxn and a mildly dilated LA with no significant LVH.  With a CHA2DS2VASc of 3 (diast chf, htn, female), decision was made to initiate long term anticoagulation and xarelto  daily was started.   She underwent successful cardioversion in June.  She was seen again in July with recurrent atrial fibrillation with rapid ventricular response. Diltiazem was discontinued and she was started on flecainide 50 mg twice daily. Successful cardioversion was performed. Upon followup, she complained of fatigue and shortness of breath. Thus, she underwent a nuclear stress test which was suggestive of anterior wall ischemia. Flecainide was held. I proceeded with cardiac catheterization which showed mild nonobstructive coronary artery disease.  She developed atrial fibrillation with rapid ventricular response again. Flecainide was resumed. She underwent recent successful cardioversion. No palpitations since then.  She had significant worsening of fluid overload. She was also noted to be bradycardic. Thus, I decreased the dose of metoprolol by half and increase the dose of furosemide to 40 mg once daily. She reports significant improvement in symptoms since then. She lost 14 pounds and her weight is back to baseline. She is using the CPAP machine for recently diagnosed sleep apnea.   Allergies  Allergen Reactions  . Levaquin [Levofloxacin In D5w]     Dizziness   . Other Other (See Comments)     Current Outpatient Prescriptions on File Prior to Visit  Medication Sig Dispense Refill  . esomeprazole (NEXIUM) 40 MG capsule Take 1 capsule (40 mg total) by mouth every morning.  30 capsule  3  . flecainide (TAMBOCOR)  100 MG tablet Take 1 tablet (100 mg total) by mouth 2 (two) times daily.  60 tablet  6  . furosemide (LASIX) 40 MG tablet Take 1 tablet (40 mg total) by mouth daily.  30 tablet  6  . metoprolol (LOPRESSOR) 50 MG tablet Take 1 tablet (50 mg total) by mouth 2 (two) times daily.  60 tablet  6  . naproxen sodium (ANAPROX) 220 MG tablet Take 220 mg by mouth as needed.       . rivaroxaban (XARELTO) 20 MG TABS tablet Take 1 tablet (20 mg total) by mouth daily with supper.  30 tablet  3  . venlafaxine (EFFEXOR) 37.5 MG tablet Take 37.5 mg by mouth daily.      Marland Kitchen zolpidem (AMBIEN) 10 MG tablet Take 10 mg by mouth at bedtime as needed.        No current facility-administered medications on file prior to visit.     Past Medical History  Diagnosis Date  . Chronic diastolic CHF (congestive heart failure)     a. Dx 08/2013 in setting of rapid afib;  b. 08/2013 Echo: EF 55-60%, mildly dil LA, nl RV.  Marland Kitchen Atrial fibrillation     a. Dx 08/2013. CHA2DS2VASc = 3 (diast chf, htn, female) -->Xarelto initiated.  . Obesity   . GERD (gastroesophageal reflux disease)   . Hypertension   . Depression   . Presumed Obstructive Sleep Apnea     a. needs outpt sleep eval.  . Coronary artery disease     Cardiac catheterization in July of 2015 showed mild three-vessel coronary artery disease  with 20% mid LAD stenosis, 20% ostial left circumflex stenosis and 20% mid RCA stenosis. Coronary arteries were mildly calcified.     Past Surgical History  Procedure Laterality Date  . Tonsillectomy    . Ectopic pregnancy surgery    . Cardiac catheterization  10/2013    [facility]     Family History  Problem Relation Age of Onset  . CAD Father     s/p cabg - ? h/o afib.  Marland Kitchen Heart attack Father   . Hypertension Mother      History   Social History  . Marital Status: Married    Spouse Name: N/A    Number of Children: N/A  . Years of Education: N/A   Occupational History  . Not on file.   Social History Main Topics  .  Smoking status: Never Smoker   . Smokeless tobacco: Not on file  . Alcohol Use: Yes     Comment: 1 glass of wine/day.  . Drug Use: No  . Sexual Activity: Not on file   Other Topics Concern  . Not on file   Social History Narrative   Lives in [place].  Works as Water quality scientist.      PHYSICAL EXAM   BP 148/60  Pulse 81  Ht 5\' 3"  (1.6 m)  Wt 274 lb 12 oz (124.626 kg)  BMI 48.68 kg/m2 Constitutional: She is oriented to person, place, and time. She appears well-developed and well-nourished. No distress.  HENT: No nasal discharge.  Head: Normocephalic and atraumatic.  Eyes: Pupils are equal and round. No discharge.  Neck: Normal range of motion. Neck supple. No JVD present. No thyromegaly present.  Cardiovascular: Normal rate, regular rhythm, normal heart sounds. Exam reveals no gallop and no friction rub. No murmur heard.  Pulmonary/Chest: Effort normal and breath sounds normal. No stridor. No respiratory distress. She has no wheezes. She has no rales. She exhibits no tenderness.  Abdominal: Soft. Bowel sounds are normal. She exhibits no distension. There is no tenderness. There is no rebound and no guarding.  Musculoskeletal: Normal range of motion. She exhibits no edema and no tenderness.  Neurological: She is alert and oriented to person, place, and time. Coordination normal.  Skin: Skin is warm and dry. No rash noted. She is not diaphoretic. No erythema. No pallor.  Psychiatric: She has a normal mood and affect. Her behavior is normal. Judgment and thought content normal.     EKG: Sinus  Rhythm  -First degree A-V block  -  -Prominent R(V1) and right axis -consider right ventricular hypertrophy  Rightward P/QRS axis and rotation -possible pulmonary disease.   -Old anterior infarct.   -  Nonspecific T-abnormality.   ABNORMAL    ASSESSMENT AND PLAN

## 2013-12-27 NOTE — Assessment & Plan Note (Signed)
She is maintaining in sinus rhythm on flecainide and metoprolol. Continue current treatment and long-term anticoagulation. Hopefully treatment of sleep apnea will help decrease the burden of atrial fibrillation.

## 2013-12-28 LAB — BASIC METABOLIC PANEL
BUN / CREAT RATIO: 9 — AB (ref 11–26)
BUN: 13 mg/dL (ref 8–27)
CHLORIDE: 98 mmol/L (ref 97–108)
CO2: 26 mmol/L (ref 18–29)
Calcium: 9.2 mg/dL (ref 8.7–10.3)
Creatinine, Ser: 1.38 mg/dL — ABNORMAL HIGH (ref 0.57–1.00)
GFR calc non Af Amer: 41 mL/min/{1.73_m2} — ABNORMAL LOW (ref >59)
GFR, EST AFRICAN AMERICAN: 48 mL/min/{1.73_m2} — AB (ref >59)
Glucose: 110 mg/dL — ABNORMAL HIGH (ref 65–99)
Potassium: 4 mmol/L (ref 3.5–5.2)
SODIUM: 141 mmol/L (ref 134–144)

## 2014-01-07 ENCOUNTER — Other Ambulatory Visit: Payer: Self-pay | Admitting: *Deleted

## 2014-01-07 MED ORDER — METOPROLOL TARTRATE 50 MG PO TABS: 50.0000 mg | ORAL_TABLET | Freq: Two times a day (BID) | ORAL | Status: DC

## 2014-01-07 NOTE — Telephone Encounter (Signed)
Metoprolol has been changed to 50mg  twice a day please resend to pharmacy.

## 2014-01-08 ENCOUNTER — Ambulatory Visit: Payer: BC Managed Care – PPO | Admitting: Cardiovascular Disease

## 2014-01-11 ENCOUNTER — Telehealth: Payer: Self-pay | Admitting: *Deleted

## 2014-01-11 NOTE — Telephone Encounter (Signed)
LVM to inform patient her FMLA forms is at desk for pick up

## 2014-01-22 ENCOUNTER — Telehealth: Payer: Self-pay

## 2014-01-22 NOTE — Telephone Encounter (Signed)
LVM 01/22/14

## 2014-01-22 NOTE — Telephone Encounter (Signed)
Pt thinks she is having problems with her medication Metoprolol or Flecainide. Very SOB, has to stop when she walks a short distance, or up steps, extremely nauseated, lightheaded and dizzy. Seems to be getting worse.

## 2014-01-23 NOTE — Telephone Encounter (Signed)
Patient stated she is feeling SOB, lightheaded and dizzy with minimal exertion  She thinks she is still in sinus rhythm She thinks either her metoprolol or the increase in her flecainide are causing her problems

## 2014-01-25 NOTE — Telephone Encounter (Signed)
Patient states she is feeling much better  She would like to discuss her metoprolol  Scheduled to see Eula Listen PA next Wednesday

## 2014-01-25 NOTE — Telephone Encounter (Signed)
She should come for a visit with an EKG.

## 2014-01-28 ENCOUNTER — Other Ambulatory Visit: Payer: Self-pay | Admitting: Cardiovascular Disease

## 2014-01-30 ENCOUNTER — Ambulatory Visit (INDEPENDENT_AMBULATORY_CARE_PROVIDER_SITE_OTHER): Payer: BC Managed Care – PPO | Admitting: Physician Assistant

## 2014-01-30 ENCOUNTER — Encounter: Payer: Self-pay | Admitting: Physician Assistant

## 2014-01-30 VITALS — BP 122/90 | HR 145 | Ht 62.0 in | Wt 280.8 lb

## 2014-01-30 DIAGNOSIS — R0602 Shortness of breath: Secondary | ICD-10-CM

## 2014-01-30 DIAGNOSIS — I5032 Chronic diastolic (congestive) heart failure: Secondary | ICD-10-CM

## 2014-01-30 DIAGNOSIS — I10 Essential (primary) hypertension: Secondary | ICD-10-CM

## 2014-01-30 DIAGNOSIS — I481 Persistent atrial fibrillation: Secondary | ICD-10-CM

## 2014-01-30 DIAGNOSIS — R42 Dizziness and giddiness: Secondary | ICD-10-CM

## 2014-01-30 DIAGNOSIS — G4733 Obstructive sleep apnea (adult) (pediatric): Secondary | ICD-10-CM

## 2014-01-30 DIAGNOSIS — I4819 Other persistent atrial fibrillation: Secondary | ICD-10-CM

## 2014-01-30 MED ORDER — DILTIAZEM HCL 60 MG PO TABS: 60.0000 mg | ORAL_TABLET | Freq: Two times a day (BID) | ORAL | Status: DC

## 2014-01-30 NOTE — Patient Instructions (Signed)
Please stop Lotrel  Please start Cardizem 60 mg twice daily  Take 120 mg Cardizem today as soon as you pick up your prescription and again this evening As soon as you get home, take 200 mg flecainide  If you are still in afib in the morning, repeat If not, resume you regular dose  Please follow up with Ward Givens, NP on Friday

## 2014-01-30 NOTE — Progress Notes (Signed)
Patient Name: Stacey [name], Stacey [name] 1950-07-31, MRN [id number]  Date of Encounter: 01/30/2014  Primary Care Provider:  Raliegh Ip, MD Primary Cardiologist:  Dr. Kirke Corin, MD  Patient Profile:  63 y.o. female with history of persistent a-fib, non-obstructive CAD, and chronic diastolic CHF here to discuss medications after self discontinuing her metoprolol 1 week ago 2/2 stating it has been causing her to feel nauseated, lightheaded, and SOB. Upon her arrival here today she was found to be in a-fib with RVR with rates 120s-150.    Problem List:   Past Medical History  Diagnosis Date  . Chronic diastolic CHF (congestive heart failure)     a. Dx 08/2013 in setting of rapid afib;  b. 08/2013 Echo: EF 55-60%, mildly dil LA, nl RV.  Marland Kitchen Persistent atrial fibrillation     a. Dx 08/2013. CHA2DS2VASc = 3 (diast chf, htn, female) -->Xarelto initiated; b. s/p DCCV x 3  . Obesity   . GERD (gastroesophageal reflux disease)   . Hypertension   . Depression   . OSA (obstructive sleep apnea)     a. on CPAP  . Coronary artery disease     a. cardiac catheterization in July of 2015 showed mild three-vessel coronary artery disease with 20% mid LAD stenosis, 20% ostial left circumflex stenosis and 20% mid RCA stenosis. Coronary arteries were mildly calcified.   Past Surgical History  Procedure Laterality Date  . Tonsillectomy    . Ectopic pregnancy surgery    . Cardiac catheterization  10/2013    [facility]     Allergies:  Allergies  Allergen Reactions  . Levaquin [Levofloxacin In D5w]     Dizziness   . Other Other (See Comments)     HPI:  63 y.o. female with the above problem list here today in a-fib with RVR, rates 120s-150.   Patient with history of persistent a-fib initially diagnosed at [facility] in May 2015. She did some some mild diastolic CHF at that time and was placed on PO Lasix. Echo showed nl LV function, mildly dilated LA, with no significant LVH. She was started on anticoagulation  with Xarelto 2/2 her CHADSVASc being 3 (diastolic CHF, HTN, female). She underwent successful cardioversion in June 2015.   She was seen again in July with recurrent a-fib with RVR. Diltiazem was discontinued and she was started on flecainide 50 mg bid. Successful cardioversion was performed. Upon follow up she complained of SOB and fatigue. Thus, she underwent a nuclear stress test which was suggestive of anterior wall ischemia. Flecainide was held. She proceeded with cardiac catheterization which showed mild nonobstructive coronary artery disease. She developed atrial fibrillation with rapid ventricular response again at the end of July. Flecainide was resumed. She underwent recent successful cardioversion. She was noted to have mentioned symptoms related possible bradycardia in the past --> dose reduction of her metoprolol in the past. She did quite well with this.   However, she comes in today stating she has never felt well while taking metoprolol, even the 25 mg dose and she felt even worse when her dose was increased to 50 mg bid. She complains of lightheadedness, dizziness, and nausea over the past several months. This eventually caused her to stop her metoprolol by her own choice. She was unaware this was an AV nodal blocking agent and she starting some old Lotrel for BP. She reports since stopping the metoprolol she does feel better. The lightheadedness, dizziness, and nausea have all stopped. She denies any chest pain, palpitations, increased  dyspnea, presyncope, syncope, or edema. She takes her Xarelto nightly with supper and has not missed a dose.      Home Medications:  Prior to Admission medications   Medication Sig Start Date End Date Taking? Authorizing Provider  amLODipine-benazepril (LOTREL) 10-20 MG per capsule Take 1 capsule by mouth daily.   Yes Historical Provider, MD  flecainide (TAMBOCOR) 100 MG tablet Take 1 tablet (100 mg total) by mouth 2 (two) times daily. 12/20/13  Yes  Iran OuchMuhammad A Arida, MD  furosemide (LASIX) 40 MG tablet Take 1 tablet (40 mg total) by mouth daily. 12/20/13  Yes [place] OuchMuhammad A Arida, MD  NEXIUM 40 MG capsule take 1 capsule by mouth every morning 01/29/14  Yes [place] OuchMuhammad A Arida, MD  venlafaxine (EFFEXOR) 37.5 MG tablet Take 37.5 mg by mouth daily.   Yes Historical Provider, MD  XARELTO 20 MG TABS tablet take 1 tablet by mouth once daily WITH SUPPER 01/29/14  Yes Iran OuchMuhammad A Arida, MD  zolpidem (AMBIEN) 10 MG tablet Take 10 mg by mouth at bedtime as needed.    Yes Historical Provider, MD            Weights: Filed Weights   01/30/14 1442  Weight: 280 lb 12 oz (127.347 kg)     Review of Systems:  All other systems reviewed and are otherwise negative except as noted above.  Physical Exam:  Blood pressure 122/90, pulse 145, height 5\' 2"  (1.575 m), weight 280 lb 12 oz (127.347 kg).  General: Pleasant, NAD Psych: Normal affect. Neuro: Alert and oriented X 3. Moves all extremities spontaneously. HEENT: Normal  Neck: Supple without bruits or JVD. Lungs:  Resp regular and unlabored, CTA. Heart: Irregularly irregular, tachycardic, no s3, s4, or murmurs. Abdomen: Soft, non-tender, non-distended, BS + x 4.  Extremities: No clubbing, cyanosis or edema. DP/PT/Radials 2+ and equal bilaterally.   Accessory Clinical Findings:  EKG - a-fib with RVR, 145, right axis, low voltage, poor R wave progression  Assessment & Plan:  1. A-fib with RVR rates 120s-150: -Discussed the life threatening dangers of self discontinuing her AV nodal blocking agent while on flecainide  -She declines restarting any form of metoprolol, including lower dose at this time - there is no way to prove that she was in either NSR or a-fib when she was having these complaints.  -Start Cardizem 120 mg now, as well as flecainide 200 mg now (she has taken 100 mg of flecainide this AM). Tonight she is to take Cardizem 60 mg.  -When she wakes up on the morning of 10/8 if she is still  in a-fib (she can check her pulse to verify irregular rhythm) she will repeat the above - Cardizem 120 mg and flecainide 200 mg. That evening she is to take flecainide 100 mg and Cardizem 60 mg.  -Long term dosing for her will be as follows: Cardizem 60 mg bid - flecainide 100 mg bid - Xarelto 20 mg w/ supper  -Lotrel was discontinued -Follow up 02/01/14 with Ward Givenshris Berge, NP for repeat EKG  -She declined synchronized cardioversion at this time (has had this x 3 previously) prefers the above option first, as she is adequately anticoagulated on Xarelto 20 mg with supper and declines missing any doses   -She would like to think about RFA in the future  -Continue treatment with CPAP -She is to call the office the if she has a possible medication concern and not just self discontinue    2. Chronic diastolic CHF: -Continue  Lasix at current dose of 40 mg daily  -SCr improved with last check on 9/3 compared to 8/17 (1.57-->1.38)  3. HTN: -Reasonably controlled -Above changes  4. Sleep apnea: -On CPAP   Eula Listen, PA-C [facility] [address] [place], [place] [address] [phone] [facility] 01/30/2014, 5:54 PM

## 2014-02-01 ENCOUNTER — Ambulatory Visit (INDEPENDENT_AMBULATORY_CARE_PROVIDER_SITE_OTHER): Payer: BC Managed Care – PPO | Admitting: Nurse Practitioner

## 2014-02-01 ENCOUNTER — Ambulatory Visit: Payer: Self-pay | Admitting: Nurse Practitioner

## 2014-02-01 ENCOUNTER — Encounter: Payer: Self-pay | Admitting: Nurse Practitioner

## 2014-02-01 VITALS — BP 120/82 | HR 134 | Ht 63.0 in | Wt 279.0 lb

## 2014-02-01 DIAGNOSIS — I10 Essential (primary) hypertension: Secondary | ICD-10-CM

## 2014-02-01 DIAGNOSIS — I5033 Acute on chronic diastolic (congestive) heart failure: Secondary | ICD-10-CM

## 2014-02-01 DIAGNOSIS — I480 Paroxysmal atrial fibrillation: Secondary | ICD-10-CM

## 2014-02-01 DIAGNOSIS — I4891 Unspecified atrial fibrillation: Secondary | ICD-10-CM

## 2014-02-01 LAB — BASIC METABOLIC PANEL
Anion Gap: 10 (ref 7–16)
BUN: 16 mg/dL (ref 7–18)
CHLORIDE: 105 mmol/L (ref 98–107)
Calcium, Total: 9.1 mg/dL (ref 8.5–10.1)
Co2: 28 mmol/L (ref 21–32)
Creatinine: 1.22 mg/dL (ref 0.60–1.30)
GFR CALC AF AMER: 58 — AB
GFR CALC NON AF AMER: 48 — AB
Glucose: 111 mg/dL — ABNORMAL HIGH (ref 65–99)
Osmolality: 287 (ref 275–301)
Potassium: 3.6 mmol/L (ref 3.5–5.1)
Sodium: 143 mmol/L (ref 136–145)

## 2014-02-01 LAB — CBC WITH DIFFERENTIAL/PLATELET
Basophil #: 0.1 10*3/uL (ref 0.0–0.1)
Basophil %: 1.1 %
EOS ABS: 0.2 10*3/uL (ref 0.0–0.7)
Eosinophil %: 2.3 %
HCT: 37.9 % (ref 35.0–47.0)
HGB: 11.6 g/dL — ABNORMAL LOW (ref 12.0–16.0)
LYMPHS ABS: 0.9 10*3/uL — AB (ref 1.0–3.6)
LYMPHS PCT: 14.4 %
MCH: 29.5 pg (ref 26.0–34.0)
MCHC: 30.7 g/dL — ABNORMAL LOW (ref 32.0–36.0)
MCV: 96 fL (ref 80–100)
MONO ABS: 0.5 x10 3/mm (ref 0.2–0.9)
Monocyte %: 7.9 %
NEUTROS PCT: 74.3 %
Neutrophil #: 4.9 10*3/uL (ref 1.4–6.5)
PLATELETS: 179 10*3/uL (ref 150–440)
RBC: 3.95 10*6/uL (ref 3.80–5.20)
RDW: 17.7 % — ABNORMAL HIGH (ref 11.5–14.5)
WBC: 6.6 10*3/uL (ref 3.6–11.0)

## 2014-02-01 LAB — PROTIME-INR
INR: 2
Prothrombin Time: 22.5 secs — ABNORMAL HIGH (ref 11.5–14.7)

## 2014-02-01 MED ORDER — DILTIAZEM HCL ER COATED BEADS 180 MG PO CP24: 180.0000 mg | ORAL_CAPSULE | Freq: Every day | ORAL | Status: DC

## 2014-02-01 MED ORDER — FLECAINIDE ACETATE 150 MG PO TABS: 150.0000 mg | ORAL_TABLET | Freq: Two times a day (BID) | ORAL | Status: DC

## 2014-02-01 NOTE — Patient Instructions (Addendum)
Please go to the Medical Mall entrance of [facility] for labs (CBC, BMET, PT/INR) and to preregister  Your physician has recommended that you have a Cardioversion (DCCV). Electrical Cardioversion uses a jolt of electricity to your heart either through paddles or wired patches attached to your chest. This is a controlled, usually prescheduled, procedure. Defibrillation is done under light anesthesia in the hospital, and you usually go home the day of the procedure. This is done to get your heart back into a normal rhythm. You are not awake for the procedure. Please see the instruction sheet given to you today.  Please arrive at the Medical Mall entrance of [facility] at 6:30am on Monday, October 12 Please do not eat or drink after midnight the night before Please increase your Lasix to 40mg  twice daily on Fri, Sat, and Sun Do not take your Lasix or Diltiazem on Monday  Please increase your Flecainide to 150mg  twice daily Stop Diltiazem 60mg  Please start Diltiazem 180 mg once daily  Your physician recommends that you schedule a follow-up appointment in: 2 weeks with Dr. Kirke Corin

## 2014-02-01 NOTE — Progress Notes (Signed)
Patient Name: Stacey [name] Date of Encounter: 02/01/2014  Primary Care Provider:  Raliegh Ip, MD Primary Cardiologist:  Judie Petit. Kirke Corin, MD   Patient Profile  63 y/o female with a h/o PAF dating back to 08/2013, s/p DCCV x 3, who presents with ongoing afib since last week.  Problem List   Past Medical History  Diagnosis Date  . Chronic diastolic CHF (congestive heart failure)     a. Dx 08/2013 in setting of rapid afib;  b. 08/2013 Echo: EF 55-60%, mildly dil LA, nl RV.  Marland Kitchen Paroxysmal atrial fibrillation     a. Dx 08/2013. CHA2DS2VASc = 3 (diast chf, htn, female) -->Xarelto initiated; b. 09/2013 s/p DCCV;  c. 10/2013 recurrent afib->Flecainide added->S/P DCCV;  d. 11/2013 Recurrent afib while off of flecainide (2/2 abnl nuc study)-->Flecainide resumed after nl cath-->DCCV;  e. Recurrent Afib 01/2014-->Flecainide increased to 150mg  BID.  Marland Kitchen Obesity   . GERD (gastroesophageal reflux disease)   . Hypertension   . Depression   . OSA (obstructive sleep apnea)     a. on CPAP  . Non-obstructive CAD     a. 10/2013 Myoview: anteroseptal, apical, septal mild ischemia, nl LV fxn;  b. Cath: LAD 60m LCX 20ost, RCA 74m.   Past Surgical History  Procedure Laterality Date  . Tonsillectomy    . Ectopic pregnancy surgery    . Cardiac catheterization  10/2013    [facility]   Allergies  Allergies  Allergen Reactions  . Levaquin [Levofloxacin In D5w]     Dizziness   . Other Other (See Comments)   HPI  63 y/o female with the above complex problem list.  In May of this year, she presented to [facility] with progressive dyspnea and afib with RVR.  Echo showed nl LV fxn with diastolic dysfxn.  She was placed on Xarelto and oral bb and digoxin with reasonable rate control.  She was subsequently discharged with a plan to pursue cardioversion after 3 wks of anticoagulation.  She continued to have fatigue and mild DOE and underwent successful DCCV in early June.  In early July, she had recurrent palpitations and was  found to have recurrent afib.  Flecainide 50mg  BID was started and she was again successfully cardioverted.    Despite maintaining sinus rhythm upon office f/u on 7/20, she reported ongoing DOE and fatigue and was set up for an exercise myoview.  This was performed as an outpt on 7/22 and showed a moderate area of mild ischemia in the antsept, apical, and septal walls.  As a result, flecainide was discontinued and arrangements were made for diagnostic catheterization, which showed mild non-obstructive CAD.  Unfortunately, she developed recurrent afib while off of flecainide.  It was thus resumed @ 100mg  BID and she again underwent cardioversion in August.    Throughout this time, she has not tolerated beta blocker therapy.  She had been on metoprolol but had persistent nausea and at times, vomiting.  She stopped her metoprolol with cessation of n/v a few wks ago.  She was able to maintain sinus rhythm but about 1 week ago, she began to experience recurrent fatigue and DOE along with increasing abd girth and lower extremity edema.  She was seen in clinic on 10/7 and wished to avoid repeat DCCV.  As a result, she was advised to take 200mg  of flecainide on 10/7 and 10/8 and dilt 60mg  BID was added to her regimen.  Arrangements were made for f/u today.  Unfortunately, she remains in afib with rates  into the 130's.  She remains fatigued and notes DOE, inc abd girth, and LEE.  Her wt, which was below 270 in July, is currently 279.  She wishes to avoid hospitalization but is now willing to consider cardioversion and also referral to Afib clinic.  Home Medications  Prior to Admission medications   Medication Sig Start Date End Date Taking? Authorizing Provider  flecainide (TAMBOCOR) 150 MG tablet Take 1 tablet (150 mg total) by mouth 2 (two) times daily. 02/01/14  Yes Ok Anis, NP  furosemide (LASIX) 40 MG tablet Take 1 tablet (40 mg total) by mouth daily. 12/20/13  Yes Iran Ouch, MD  NEXIUM 40  MG capsule take 1 capsule by mouth every morning 01/29/14  Yes Iran Ouch, MD  venlafaxine (EFFEXOR) 37.5 MG tablet Take 37.5 mg by mouth daily.   Yes Historical Provider, MD  XARELTO 20 MG TABS tablet take 1 tablet by mouth once daily WITH SUPPER 01/29/14  Yes Iran Ouch, MD  zolpidem (AMBIEN) 10 MG tablet Take 10 mg by mouth at bedtime as needed.    Yes Historical Provider, MD  diltiazem (CARDIZEM CD) 180 MG 24 hr capsule Take 1 capsule (180 mg total) by mouth daily. 02/01/14   Ok Anis, NP    Family History  Family History  Problem Relation Age of Onset  . CAD Father     s/p cabg - ? h/o afib.  Marland Kitchen Heart attack Father   . Hypertension Mother     Social History  History   Social History  . Marital Status: Married    Spouse Name: N/A    Number of Children: N/A  . Years of Education: N/A   Occupational History  . Not on file.   Social History Main Topics  . Smoking status: Never Smoker   . Smokeless tobacco: Not on file  . Alcohol Use: Yes     Comment: 1 glass of wine/day.  . Drug Use: No  . Sexual Activity: Not on file   Other Topics Concern  . Not on file   Social History Narrative   Lives in [place].  Works as Water quality scientist.      Review of Systems General:  No chills, fever, night sweats or weight changes.  Cardiovascular:  No chest pain,+++  dyspnea on exertion, +++ edema, no orthopnea, +++ palpitations, no paroxysmal nocturnal dyspnea. Dermatological: No rash, lesions/masses Respiratory: No cough, +++ dyspnea Urologic: No hematuria, dysuria Abdominal:   No nausea, vomiting, diarrhea, bright red blood per rectum, melena, or hematemesis Neurologic:  No visual changes, wkns, changes in mental status. All other systems reviewed and are otherwise negative except as noted above.  Physical Exam  Blood pressure 120/82, pulse 134, height 5\' 3"  (1.6 m), weight 279 lb (126.554 kg).  General: Pleasant, NAD Psych: Normal affect. Neuro: Alert  and oriented X 3. Moves all extremities spontaneously. HEENT: Normal  Neck: Supple without bruits.  Obese - difficult to assess jvp. Lungs:  Resp regular and unlabored, CTA. Heart: IR, IR, tachy no s3, s4, or murmurs. Abdomen: Soft, non-tender, non-distended, BS + x 4.  Extremities: No clubbing, cyanosis.  1+ bilat LE edema with mild erythema to anterior lower legs. DP/PT/Radials 2+ and equal bilaterally.  Accessory Clinical Findings  ECG - afib, 115, r axis, no acute st/t changes.  Assessment & Plan  1.  PAF:  Pt has a h/o PAF dating back to May of this year with DCCV x 3.  She has most recently been maintained on flecainide 150 mg BID and stopped taking metoprolol 2/2 nausea and vomiting.  She began to note recurrent fatigue and dyspnea about a week ago and was found to be back in afib on 10/7. Despite escalation of her flecainide dose and addition of dilt, she remains in afib with rvr, with rates into the 130's today.  She is asymptomatic at rest.  I discussed her case with Dr. Kirke CorinArida.  In the short-term, pt is not interested in flecainide washout and subsequent initiation of an alternate antiarrhythmic.  She is likely to be a poor candidate for sotalol 2/2 intolerance to beta blockers previously.  Tikosyn is a viable option, however she would like to avoid hospitalization if possible.  Dr. Kirke CorinArida did not feel Multaq would suffice if she has alreadyfailed a 1C agent.  As a result, we will increase her flecainide to 150mg  BID and change her dilt to dilt CD 180mg  daily.  She remains anticoagulated with xarelto and has not missed any doses.  We have arranged for DCCV on Monday 10/12.  She will hold her dilt that morning.  I have also arranged for f/u with Dr. Lollie SailsAllred/Donna Carroll in clinic on 10/19, to begin the discussion of future antiarrhythmic options vs role of RFCA.  2.  Acute on chronic diastolic CHF:  Wt is up considerably since July and about 5 or six lbs since early September.  She does have  edema on exam.  I have asked her to increase her lasix to 40mg  BID through Sunday.  I will check labs today.  Hopefully med changes and subsequent DCCV will provide better rate control and lessen her degree of diastolic dysfxn.  3.  HTN:  Stable.    4.  Morbid Obesity:  Likely contributing to recurrent afib.  5.  OSA:  On CPAP.  6.  Dispo:  Labs today in preparation for DCCV Monday.  F/U w/ Dr. Johney FrameAllred on 10/19.  Nicolasa Duckinghristopher Delmar Arriaga, NP 02/01/2014, 4:38 PM

## 2014-02-04 ENCOUNTER — Ambulatory Visit: Payer: Self-pay | Admitting: Cardiovascular Disease

## 2014-02-04 DIAGNOSIS — I4891 Unspecified atrial fibrillation: Secondary | ICD-10-CM

## 2014-02-08 ENCOUNTER — Ambulatory Visit (HOSPITAL_COMMUNITY): Payer: BC Managed Care – PPO | Admitting: Nurse Practitioner

## 2014-02-11 ENCOUNTER — Ambulatory Visit (INDEPENDENT_AMBULATORY_CARE_PROVIDER_SITE_OTHER): Payer: BC Managed Care – PPO | Admitting: Internal Medicine

## 2014-02-11 ENCOUNTER — Encounter: Payer: Self-pay | Admitting: Cardiovascular Disease

## 2014-02-11 ENCOUNTER — Encounter: Payer: Self-pay | Admitting: Internal Medicine

## 2014-02-11 VITALS — BP 120/70 | HR 93 | Ht 63.0 in | Wt 259.4 lb

## 2014-02-11 DIAGNOSIS — I480 Paroxysmal atrial fibrillation: Secondary | ICD-10-CM

## 2014-02-11 DIAGNOSIS — I4819 Other persistent atrial fibrillation: Secondary | ICD-10-CM

## 2014-02-11 DIAGNOSIS — I119 Hypertensive heart disease without heart failure: Secondary | ICD-10-CM

## 2014-02-11 DIAGNOSIS — G4730 Sleep apnea, unspecified: Secondary | ICD-10-CM

## 2014-02-11 DIAGNOSIS — I481 Persistent atrial fibrillation: Secondary | ICD-10-CM

## 2014-02-11 DIAGNOSIS — E669 Obesity, unspecified: Secondary | ICD-10-CM

## 2014-02-11 NOTE — Progress Notes (Signed)
Primary Care Physician: Raliegh Ip, MD Primary Cardiologist:Dr. Glynn Octave Primary Electrophysiologist:Dr. Hatice Bubel Referring Physician: Dr. Nigel Mormon Knapper is a 63 y.o. female with a h/o  paroxysmal  atrial fibrillation who presents for consultation in the [facility].  The patient was initially diagnosed with atrial fibrillation in May after seeking medical attention  after presenting with symptoms of sob. She was found to be in afib with RVR, hospitalized and underwent successful  DCCV. She has since had cardioversion and additional 3 times with the last DCCV 10/12. At that time flecainide was increased to 150 mg bid and so far has been maintaining SR and feels well today. She has lost 23 lbs since May. Was diagnosed with sleep apnea late summer and is now on cpap and is tolerating. She has minimal caffeine and drinks up to 2 alcoholic drinks nightly.   Today, she denies symptoms of palpitations, chest pain, shortness of breath, orthopnea, PND, lower extremity edema, dizziness, presyncope, syncope, snoring, daytime somnolence, bleeding, or neurologic sequela. The patient is tolerating medications without difficulties and is otherwise without complaint today.    Atrial Fibrillation Risk Factors:  she does have symptoms or diagnosis of sleep apnea. she is compliant with CPAP therapy.  she does not have a history of rheumatic fever.  she does have a history of alcohol use.  she has a BMI of Body mass index is 45.96 kg/(m^2).Marland Kitchen Filed Weights   02/11/14 0914  Weight: 259 lb 6.4 oz (117.663 kg)    LA size: Mildly dilated. Do not actual echo report done at [facility].   Atrial Fibrillation Management history:  Previous antiarrhythmic drugs: flecanide  Previous cardioversions: x4  Previous ablations: none  CHADS2VASC score: at least 2  Anticoagulation history:  Xarelto   Past Medical History  Diagnosis Date  . Chronic diastolic CHF  (congestive heart failure)     a. Dx 08/2013 in setting of rapid afib;  b. 08/2013 Echo: EF 55-60%, mildly dil LA, nl RV.  Marland Kitchen Paroxysmal atrial fibrillation     a. Dx 08/2013. CHA2DS2VASc = 3 (diast chf, htn, female) -->Xarelto initiated; b. 09/2013 s/p DCCV;  c. 10/2013 recurrent afib->Flecainide added->S/P DCCV;  d. 11/2013 Recurrent afib while off of flecainide (2/2 abnl nuc study)-->Flecainide resumed after nl cath-->DCCV;  e. Recurrent Afib 01/2014-->Flecainide increased to 150mg  BID.  Marland Kitchen Obesity   . GERD (gastroesophageal reflux disease)   . Hypertension   . Depression   . OSA (obstructive sleep apnea)     a. on CPAP  . Non-obstructive CAD     a. 10/2013 Myoview: anteroseptal, apical, septal mild ischemia, nl LV fxn;  b. Cath: LAD 79m LCX 20ost, RCA 34m.   Past Surgical History  Procedure Laterality Date  . Tonsillectomy    . Ectopic pregnancy surgery    . Cardiac catheterization  10/2013    [facility]    Current Outpatient Prescriptions  Medication Sig Dispense Refill  . diltiazem (CARDIZEM CD) 180 MG 24 hr capsule Take 1 capsule (180 mg total) by mouth daily.  30 capsule  6  . flecainide (TAMBOCOR) 150 MG tablet Take 1 tablet (150 mg total) by mouth 2 (two) times daily.  60 tablet  6  . furosemide (LASIX) 40 MG tablet Take 1 tablet (40 mg total) by mouth daily.  30 tablet  6  . NEXIUM 40 MG capsule take 1 capsule by mouth every morning  30 capsule  3  . venlafaxine (EFFEXOR) 37.5 MG tablet  Take 37.5 mg by mouth daily.      Carlena Hurl 20 MG TABS tablet take 1 tablet by mouth once daily WITH SUPPER  30 tablet  3  . zolpidem (AMBIEN) 10 MG tablet Take 10 mg by mouth at bedtime as needed for sleep.        No current facility-administered medications for this visit.    Allergies  Allergen Reactions  . Levaquin [Levofloxacin In D5w] Other (See Comments)    Severe dizziness   . Metoprolol Itching, Nausea And Vomiting, Rash and Other (See Comments)    Dizziness    History   Social History   . Marital Status: Married    Spouse Name: N/A    Number of Children: N/A  . Years of Education: N/A   Occupational History  . Not on file.   Social History Main Topics  . Smoking status: Never Smoker   . Smokeless tobacco: Not on file  . Alcohol Use: Yes     Comment: 1 glass of wine/day.  . Drug Use: No  . Sexual Activity: Not on file   Other Topics Concern  . 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     Family History  Problem Relation Age of Onset  . CAD Father     s/p cabg - ? h/o afib.  Marland Kitchen Heart attack Father   . Hypertension Mother    The patient does not have a history of early familial atrial fibrillation or other arrhythmias.  ROS- All systems are reviewed and negative except as per the HPI above.  Physical Exam: Filed Vitals:   02/11/14 0914  BP: 120/70  Pulse: 93  Height: 5\' 3"  (1.6 m)  Weight: 259 lb 6.4 oz (117.663 kg)    GEN- The patient is well appearing, alert and oriented x 3 today.   Head- normocephalic, atraumatic Eyes-  Sclera clear, conjunctiva pink Ears- hearing intact Oropharynx- clear Neck- supple, no JVP Lymph- no cervical lymphadenopathy Lungs- Clear to ausculation bilaterally, normal work of breathing Heart- Regular rate and rhythm, no murmurs, rubs or gallops, PMI not laterally displaced GI- soft, NT, ND, + BS Extremities- no clubbing, cyanosis, or edema MS- no significant deformity or atrophy Skin- no rash or lesion Psych- euthymic mood, full affect Neuro- strength and sensation are intact  EKG Sinus with  1st degree av block, RAD, IRBBB. Echo report per epic records, actual echo report not available in Epic, NML LV fx, EF 55-60%, mildly dilated LA  Epic records are reviewed at length today  Assessment and Plan:  1. Atrial fibrillation The patient has Symptomatic paroxysmal/ persistent atrial fibrillation.  The patients CHAD2VASC score is at least 2,.  she is  appropriately  anticoagulated at this time. The patient is adequately rate controlled with flecainide and cardizem, having not tolerated BB with N/V.Marland Kitchen Antiarrhythmic therapy to dates has included  Flecainide.   A long discussion with the patient was had today regarding therapeutic strategies.  Extensive discussion of lifestyle modification including begin progressive daily aerobic exercise program, attempt to lose weight, decrease or avoid alcohol intake, continue compiance with cpap, improve dietary compliance and continue current medications was also discussed.  Presently, our recommendations include :   1. Morbid obesity As above, lifestyle modification was discussed at length including regular exercise and weight reduction.  2. Obstructive sleep apnea The importance of adequate treatment of sleep apnea was discussed today in order  to improve   ability to maintain sinus rhythm long term.  3. Flecainide 150 mg bid as well as cardizem 180 mg daily. Peak flecainide level today to assess for toxicity.  4. Limit alcohol to 2 drinks a week.  5. CHA2DS2VASc of at least 2. Continue xarelto.  6.F/u Afib clinic in 3 months, sooner if increase in afib burden at which time patient would be assessed for other treatment options including other antiarrythmic's and/ or ablation.   Rudi Cocoonna Carroll NP  Nurse Practitioner, [facility] 02/11/2014 11:23 PM   I have seen, examined the patient, and reviewed the above assessment and plan with Rudi Cocoonna Carroll NP.  Changes to above are made where necessary.  The patient would favor medical therapy at this point.  The importance of lifestyle modification was discussed today.  I have reviewed the patients BMI and decreased success rates with ablation at length today.  Weight loss is strongly advised.  Per Guijian et al (PACE 2013; 36: 454-098: 748-756), patients with BMI 25-29.9 (obese) have a 27% increase in AF recurrence post ablation.  Patients with BMI  >30 have a 31% increase in AF recurrence post ablation when compared to those with BMI <25.  She will continue lifestyle modification.  She will return to see Rudi Cocoonna Carroll NP in 3 months for follow-up.  If she fails medical therapy with flecainide then I think that either tikosyn or ablation would be reasonable.  I will check a flecainide peak level today given her recent increase in dose.   Co Sign: Hillis RangeAllred, Lorell Thibodaux, MD 02/11/2014 11:24 PM

## 2014-02-11 NOTE — Patient Instructions (Signed)
Your physician recommends that you schedule a follow-up appointment in: 3 months with Sharilyn Sites, NP in the afib clinic at the hospital  Your physician recommends that you return for lab work today: Flecainide level  Continue with weight loss, increase your exercise, decrease alcohol to 2 drinks per week

## 2014-02-14 ENCOUNTER — Ambulatory Visit: Payer: BC Managed Care – PPO | Admitting: Cardiovascular Disease

## 2014-02-16 LAB — FLECAINIDE LEVEL: Flecainide: 1.49 ug/mL (ref 0.20–1.00)

## 2014-02-18 ENCOUNTER — Other Ambulatory Visit: Payer: Self-pay | Admitting: *Deleted

## 2014-02-18 ENCOUNTER — Telehealth: Payer: Self-pay | Admitting: *Deleted

## 2014-02-18 DIAGNOSIS — I480 Paroxysmal atrial fibrillation: Secondary | ICD-10-CM

## 2014-02-18 NOTE — Telephone Encounter (Signed)
Called patient and her work and was placed on hold she never came to the phone.  I then left her a message on her cell phone to call the office to talk to me about decreasing a medication back to 100 mg twice daily and come in next Mon for repeat blood work  I will have Audrie Lia, Pharm D as to when to come for labs

## 2014-02-19 ENCOUNTER — Ambulatory Visit (INDEPENDENT_AMBULATORY_CARE_PROVIDER_SITE_OTHER): Payer: BC Managed Care – PPO | Admitting: Cardiovascular Disease

## 2014-02-19 ENCOUNTER — Encounter: Payer: Self-pay | Admitting: Cardiovascular Disease

## 2014-02-19 VITALS — BP 120/78 | HR 79 | Ht 63.0 in | Wt 260.0 lb

## 2014-02-19 DIAGNOSIS — I5032 Chronic diastolic (congestive) heart failure: Secondary | ICD-10-CM

## 2014-02-19 DIAGNOSIS — I4891 Unspecified atrial fibrillation: Secondary | ICD-10-CM

## 2014-02-19 NOTE — Progress Notes (Signed)
HPI  63 y/o female who is here today for a followup visit regarding persistent atrial fibrillation. She has known history of  HTN and depression.  She was hospitalized at [facility] in May with atrial fibrillation with RVR.  She had mild diastolic heart failure and was placed on oral lasix.  Echo showed nl LV fxn and a mildly dilated LA with no significant LVH.  With a CHA2DS2VASc of 3 (diast chf, htn, female), decision was made to initiate long term anticoagulation and xarelto 20mg  daily was started.   She underwent successful cardioversion in June.  She had recurrent atrial fibrillation complicated by diastolic heart failure which required repeated cardioversions. She is currently on flecainide. She has been doing well since most recent cardioversion. She is using the CPAP machine for recently diagnosed sleep apnea.   Allergies  Allergen Reactions  . Levaquin [Levofloxacin In D5w] Other (See Comments)    Severe dizziness   . Metoprolol Itching, Nausea And Vomiting, Rash and Other (See Comments)    Dizziness     Current Outpatient Prescriptions on File Prior to Visit  Medication Sig Dispense Refill  . diltiazem (CARDIZEM CD) 180 MG 24 hr capsule Take 1 capsule (180 mg total) by mouth daily.  30 capsule  6  . furosemide (LASIX) 40 MG tablet Take 1 tablet (40 mg total) by mouth daily.  30 tablet  6  . NEXIUM 40 MG capsule take 1 capsule by mouth every morning  30 capsule  3  . venlafaxine (EFFEXOR) 37.5 MG tablet Take 37.5 mg by mouth daily.      Carlena Hurl 20 MG TABS tablet take 1 tablet by mouth once daily WITH SUPPER  30 tablet  3  . zolpidem (AMBIEN) 10 MG tablet Take 10 mg by mouth at bedtime as needed for sleep.        No current facility-administered medications on file prior to visit.     Past Medical History  Diagnosis Date  . Chronic diastolic CHF (congestive heart failure)     a. Dx 08/2013 in setting of rapid afib;  b. 08/2013 Echo: EF 55-60%, mildly dil LA, nl RV.  Marland Kitchen  Paroxysmal atrial fibrillation     a. Dx 08/2013. CHA2DS2VASc = 3 (diast chf, htn, female) -->Xarelto initiated; b. 09/2013 s/p DCCV;  c. 10/2013 recurrent afib->Flecainide added->S/P DCCV;  d. 11/2013 Recurrent afib while off of flecainide (2/2 abnl nuc study)-->Flecainide resumed after nl cath-->DCCV;  e. Recurrent Afib 01/2014-->Flecainide increased to 150mg  BID.  Marland Kitchen Obesity   . GERD (gastroesophageal reflux disease)   . Hypertension   . Depression   . OSA (obstructive sleep apnea)     a. on CPAP  . Non-obstructive CAD     a. 10/2013 Myoview: anteroseptal, apical, septal mild ischemia, nl LV fxn;  b. Cath: LAD 51m LCX 20ost, RCA 83m.     Past Surgical History  Procedure Laterality Date  . Tonsillectomy    . Ectopic pregnancy surgery    . Cardiac catheterization  10/2013    [facility]     Family History  Problem Relation Age of Onset  . CAD Father     s/p cabg - ? h/o afib.  Marland Kitchen Heart attack Father   . Hypertension Mother      History   Social History  . Marital Status: Married    Spouse Name: N/A    Number of Children: N/A  . Years of Education: N/A   Occupational History  . Not on  file.   Social History Main Topics  . Smoking status: Never Smoker   . Smokeless tobacco: Not on file  . Alcohol Use: Yes     Comment: 1 glass of wine/day.  . Drug Use: No  . Sexual Activity: Not on file   Other Topics Concern  . Not on file   Social History Narrative   [place] in [place].  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      PHYSICAL EXAM   BP 120/78  Pulse 79  Ht 5\' 3"  (1.6 m)  Wt 260 lb (117.935 kg)  BMI 46.07 kg/m2 Constitutional: She is oriented to person, place, and time. She appears well-developed and well-nourished. No distress.  HENT: No nasal discharge.  Head: Normocephalic and atraumatic.  Eyes: Pupils are equal and round. No discharge.  Neck: Normal range of motion. Neck supple. No JVD present. No thyromegaly present.  Cardiovascular: Normal rate, regular rhythm, normal  heart sounds. Exam reveals no gallop and no friction rub. No murmur heard.  Pulmonary/Chest: Effort normal and breath sounds normal. No stridor. No respiratory distress. She has no wheezes. She has no rales. She exhibits no tenderness.  Abdominal: Soft. Bowel sounds are normal. She exhibits no distension. There is no tenderness. There is no rebound and no guarding.  Musculoskeletal: Normal range of motion. She exhibits no edema and no tenderness.  Neurological: She is alert and oriented to person, place, and time. Coordination normal.  Skin: Skin is warm and dry. No rash noted. She is not diaphoretic. No erythema. No pallor.  Psychiatric: She has a normal mood and affect. Her behavior is normal. Judgment and thought content normal.     EKG: Sinus  Rhythm  Low voltage in precordial leads.   -Left axis.   -Poor R-wave progression -may be secondary to pulmonary disease   consider old anterior infarct.   -  Nonspecific T-abnormality.     ASSESSMENT AND PLAN

## 2014-02-19 NOTE — Assessment & Plan Note (Signed)
She is maintaining a normal sinus rhythm after most recent cardioversion. She has already been seen by Dr. Johney Frame to consider catheter ablation or a different antiarrhythmic medication. She appears to be stable on flecainide which was recently reduced due to elevated level. Continue current medications.

## 2014-02-19 NOTE — Assessment & Plan Note (Signed)
She appears to be euvolemic on current dose of furosemide. 

## 2014-02-19 NOTE — Patient Instructions (Signed)
Continue same medications.   Your physician wants you to follow-up in: 6 months.  You will receive a reminder letter in the mail two months in advance. If you don't receive a letter, please call our office to schedule the follow-up appointment.  Your next appointment will be scheduled in our new office located at :  [facility]  [address], [address]  [place], [place] [address]  

## 2014-02-25 ENCOUNTER — Other Ambulatory Visit: Payer: BC Managed Care – PPO

## 2014-02-26 ENCOUNTER — Other Ambulatory Visit: Payer: BC Managed Care – PPO

## 2014-02-26 DIAGNOSIS — I480 Paroxysmal atrial fibrillation: Secondary | ICD-10-CM

## 2014-03-01 LAB — FLECAINIDE LEVEL: Flecainide: 0.68 ug/mL (ref 0.20–1.00)

## 2014-03-04 ENCOUNTER — Encounter: Payer: Self-pay | Admitting: Cardiovascular Disease

## 2014-04-02 ENCOUNTER — Ambulatory Visit: Payer: BC Managed Care – PPO | Admitting: Cardiovascular Disease

## 2014-05-25 ENCOUNTER — Other Ambulatory Visit: Payer: Self-pay | Admitting: Cardiovascular Disease

## 2014-05-30 ENCOUNTER — Telehealth: Payer: Self-pay

## 2014-05-30 ENCOUNTER — Other Ambulatory Visit: Payer: Self-pay | Admitting: *Deleted

## 2014-05-30 MED ORDER — FUROSEMIDE 40 MG PO TABS: 40.0000 mg | ORAL_TABLET | Freq: Every day | ORAL | Status: DC

## 2014-05-30 NOTE — Telephone Encounter (Signed)
°  1. Which medications need to be refilled? Lasix 40 mg   2. Which pharmacy is medication to be sent to? [facility] [address]    3. Do they need a 30 day or 90 day supply? No preference mentioned  4. Would they like a call back once the medication has been sent to the pharmacy? Yes please tomorrow is last dose pharmacy still had 20 mg prescription needs to be changed to the new 40 mg.

## 2014-08-17 NOTE — Consult Note (Signed)
PATIENT NAME:  Stacey [name], Stacey [name] MR#:  [id number] DATE OF BIRTH:  [birth year]  DATE OF CONSULTATION:  08/27/2013  REQUESTING PHYSICIAN:  Dr. Hilton Sinclair CONSULTING PHYSICIAN:  Chelsea Aus. Kirke Corin, MD  PRIMARY CARE PHYSICIAN:  Dr. Vear Clock    REASON FOR CONSULTATION: Atrial fibrillation.   HISTORY OF PRESENT ILLNESS: This is a pleasant 64 year old female with no previous cardiac history. She started complaining of significant exertional dyspnea and fatigue which started on Friday and got worse over the weekend. She went to urgent care today and was found to be in atrial fibrillation with a heart rate of 157 beats per minute. She was sent to the Emergency Room. She is not aware of any previous history of arrhythmia. She has no history of coronary artery disease or congestive heart failure.  No previous history of stroke or diabetes. She does have known history of hypertension and has been on medications. She was started on diltiazem drip with improved ventricular rate. She is feeling better.   PAST MEDICAL HISTORY: Includes hypertension, gastroesophageal reflux disease and depression.   ALLERGIES: Include LEVAQUIN.   HOME MEDICATIONS:  Include Ambien 10 mg at bedtime, Effexor once daily, Lasix 20 mg once daily, Lotrel 10/20 mg once daily and Nexium 40 mg once daily.   SOCIAL HISTORY: Negative for smoking. She drinks a glass of wine per day. She denies any recreational drug use. She works as a Engineer, civil (consulting) at [employer].   FAMILY HISTORY: Remarkable for coronary artery disease. Father had CABG and possible atrial fibrillation. He used to be on warfarin.   REVIEW OF SYSTEMS: A 10-point review of systems was performed. It is negative other than what is mentioned in the HPI.   PHYSICAL EXAMINATION: GENERAL: The patient appears to be at her stated age, in no acute distress.  VITAL SIGNS: Temperature 98.1, pulse was 110, respiratory rate 26. Blood pressure is 110/70 and oxygen saturation is 96% on 2 liters nasal  cannula. HEENT:  Normocephalic, atraumatic.  NECK: No JVD or carotid bruits.  RESPIRATORY: Normal respiratory effort with no use of accessory muscles. Auscultation reveals normal breath sounds.  CARDIOVASCULAR: PMI is not palpable. The patient is tachycardic with a regular rhythm. No gallops or murmurs.  ABDOMEN: Benign, nontender and nondistended.  EXTREMITIES: With +1 bilateral edema with chronic stasis dermatitis.   LABORATORY AND DIAGNOSTIC DATA: EKG showed atrial fibrillation with rapid ventricular response.  BNP was 1700. Cardiac enzymes are negative. TSH is normal. CBC is unremarkable.   ASSESSMENT AND PLAN: 1.  Newly diagnosed atrial fibrillation with rapid ventricular response: The onset is likely on Friday. No previous documented episodes. I agree with rate control with diltiazem. Continue oral diltiazem, as well as noted to be able to able to wean off drip. An echocardiogram was requested. If ejection fraction is reduced, I recommend switching to a beta blocker. She is currently being anticoagulated with heparin. CHADS VASc score is 2 due to gender and hypertension. Thus, I recommend long-term anticoagulation. Risks and benefits were discussed with the patient. I also discussed the different options of oral anticoagulation and she clearly prefers not to be on warfarin. Thus, I think it is reasonable to start her on Xarelto 20 mg once daily, which can be started tomorrow and switched from heparin if she continues to be stable. If ventricular rate becomes controlled, cardioversion can be considered in 3 weeks. Otherwise, TEE cardioversion might be needed if ventricular rate is not controlled.  2.  Hypertension: Blood pressure is controlled. Consider holding ACE  inhibitor in order to allow up titration of rate controlling medications.   ____________________________ Chelsea Aus Kirke Corin, MD maa:ce D: 08/27/2013 18:48:10 ET T: 08/27/2013 20:28:18 ET JOB#: [id number]  cc: Leani Myron A. Kirke Corin, MD,  <Dictator> Iran Ouch MD ELECTRONICALLY SIGNED 09/05/2013 22:40

## 2014-08-17 NOTE — H&P (Signed)
PATIENT NAME:  Stacey [name], Stacey [name] MR#:  [id number] DATE OF BIRTH:  November 12, 1950  DATE OF ADMISSION:  08/27/2013  PRIMARY CARE PHYSICIAN: Dr. Vear Clock  CHIEF COMPLAINT: Shortness of breath.   HISTORY OF PRESENT ILLNESS: This is a 64 year old female who has been having shortness of breath since Friday, got worse on Saturday and Sunday. Anytime she does any activity has to sit down to catch her breath because she becomes short of breath. She went to urgent care today. Pulse was up at 157. Was sent in to the Emergency Room and was found in rapid atrial fibrillation. Hospitalist services were contacted for further evaluation. The patient has no complaints of chest pain.   PAST MEDICAL HISTORY: Hypertension, gastroesophageal reflux disease, and on medication for depression.   PAST SURGICAL HISTORY: Ectopic pregnancy and tonsillectomy.   ALLERGIES: LEVAQUIN.   MEDICATIONS: As per prescription writer include Ambien 10 mg at bedtime, Effexor XR 37.5 mg extended-release, Lasix 20 mg daily, Lotrel 10/20 one capsule daily, and Nexium 40 mg daily.   SOCIAL HISTORY: No smoking. Drinks a glass of wine per day. No drug use. Works as a Engineer, civil (consulting) at [employer].   FAMILY HISTORY: Father with CABG, possible A-fib. Mother living with hypertension.  REVIEW OF SYSTEMS: CONSTITUTIONAL: No fever, chills, or sweats. Positive for fatigue. No weight gain. No weight loss.  EYES: She does wear glasses.  EARS, NOSE, MOUTH AND THROAT: Decreased hearing. No sore throat. No difficulty swallowing.  CARDIOVASCULAR: No chest pain. No palpitations.  RESPIRATORY: Positive for shortness of breath. Occasional cough. No sputum. No hemoptysis.  GASTROINTESTINAL: No nausea. No vomiting. No abdominal pain. No diarrhea. No constipation. No bright red blood per rectum. No melena.  GENITOURINARY: No burning on urination. No hematuria.  MUSCULOSKELETAL: Positive for knee pain.  INTEGUMENT: No rashes or eruptions.  NEUROLOGIC: No fainting or  blackouts.  PSYCHIATRIC: No anxiety or depression.  ENDOCRINE: No thyroid problems.  HEMATOLOGIC AND LYMPHATIC: No anemia. No easy bruising or bleeding.   PHYSICAL EXAMINATION: VITAL SIGNS: Temperature 98, pulse ranging between 118 and 130, respirations 18, blood pressure 130/85, pulse ox 99% on oxygen.  GENERAL: No respiratory distress.  EYES: Conjunctivae and lids normal. Pupils equal, round, and reactive to light. Extraocular muscles intact. No nystagmus.  EARS, NOSE, MOUTH AND THROAT: Tympanic membranes: No erythema. Nasal mucosa: No erythema. Throat: No erythema. No exudate seen. Lips and gums: No lesions.  NECK: No JVD. No bruits. No lymphadenopathy. No thyromegaly. No thyroid nodules palpated.  RESPIRATORY: Lungs clear to auscultation. No use of accessory muscles to breathe. No rhonchi, rales, or wheeze heard.  CARDIOVASCULAR: S1 and S2 irregularly irregular, tachycardic. No gallops, rubs, or murmurs heard. Carotid upstroke 2+ bilaterally. No bruits.  EXTREMITIES: Dorsalis pedis pulses 1+ bilaterally.  ABDOMEN: Soft, nontender. No organosplenomegaly. Normoactive bowel sounds. No masses felt.  LYMPHATIC: No lymph nodes in the neck.  MUSCULOSKELETAL: No clubbing. Trace edema. No cyanosis.  SKIN: No ulcers or lesions.  NEUROLOGIC: Cranial nerves II through XII grossly intact. Deep tendon reflexes half plus bilateral lower extremities.  PSYCHIATRIC: The patient is oriented to person, place, and time.   DIAGNOSTIC DATA: Chest x-ray showed no evidence of cardiopulmonary disease.   White blood cell count 5.3, H and H 12.3 and 37.9, and platelet count 194. Glucose 108, BUN 14, creatinine 1.02, sodium 139, potassium 4, chloride 104, CO2 27. Troponin negative. CPK negative. PT and INR normal range. BNP 1793. TSH 3.04.   EKG: Atrial fibrillation, 142 beats per minute, rapid  ventricular response.   ASSESSMENT AND PLAN: 1.  Rapid atrial fibrillation, rapid ventricular response. The patient was  placed on Cardizem drip and heparin drip by the ER physician. Will admit to be CCU stepdown. We will start oral Cardizem 60 mg stat and q. 6 hours in order to come off the drip. Will obtain an echocardiogram to determine structural and valvular abnormalities on echo, if present or not. The patient's CHADS-VASc score is 1 point for female age, 1 point for hypertension. I will obtain a cardiology consultation for possibility for anticoagulation. Hopefully can just do an aspirin.  2.  Hypertension. Blood pressure is stable. We will stop Norvasc since I am using Cardizem instead and continue benazepril part of the Lotrel.  3.  Gastroesophageal reflux disease. Protonix while here in the hospital. Nexium as outpatient.  4.  On medication for depression. Continue Effexor.  The patient will be admitted to the CCU stepdown.   TIME SPENT ON ADMISSION: 55 minutes.   ____________________________ Herschell Dimes. Renae Gloss, MD rjw:sb D: 08/27/2013 16:26:09 ET T: 08/27/2013 17:15:53 ET JOB#: [id number]  cc: Herschell Dimes. Renae Gloss, MD, <Dictator> Marcine Matar., MD Salley Scarlet MD ELECTRONICALLY SIGNED 09/02/2013 14:45

## 2014-08-17 NOTE — Discharge Summary (Signed)
PATIENT NAME:  Stacey [name], Stacey [name] MR#:  [id number] DATE OF BIRTH:  12/21/1950  DATE OF ADMISSION:  08/27/2013 DATE OF DISCHARGE:  08/29/2013  PRESENTING COMPLAINT: Shortness of breath.   DISCHARGE DIAGNOSES: 1. Acute congestive heart failure, diastolic.  2. Rapid atrial fibrillation with rapid ventricular response, new onset.  3. Obesity.  4. Suspected sleep apnea. The patient to get sleep study as outpatient.  5. Morbid obesity.  6. Gastroesophageal reflux disease.  CONDITION ON DISCHARGE: Fair.   CODE STATUS: Full code.   Sats 815 to 84% on room air on ambulation more than 92% on 2 liters nasal cannula. The patient has been set up at home oxygen.   MEDICATIONS: 1. Nexium 40 mg p.o. daily.  2. Effexor 37.5 mg extended release p.o. daily.  3. Ambien 10 mg at bedtime as needed.  4. Lasix 40 mg daily.  5. Xarelto 20 mg daily.  6. Diltiazem 120 mg once a day.  7. Digoxin 250 mg p.o. daily.  8. Metoprolol tartrate 25 mg b.i.d.  9. The patient advised to stop taking Lotrel.  10. Nasal cannula 2 liters oxygen.   FOLLOWUP:  1. With Dr. Mariah Milling in two weeks.   2. Followup with Dr. Loma Sender in 1 to 2 weeks, primary care physician.   CONSULTATION: Cardiology Dr. Mariah Milling.   LABORATORY DATA: White count is 6.0, H and H 10.9 and 33.0. Lipid profile within normal limits, except LDL of 114. Echo Doppler showed ejection fraction of 55% to 60%, normal left ventricular systolic function and right ventricular systolic function. Cardiac enzymes x 3 negative. TSH 3.04. B-type natriuretic peptide is 1793.   BRIEF SUMMARY OF HOSPITAL COURSE: [name]  is a 64 year old Caucasian female with past medical history of hypertension and morbid obesity comes into the Emergency Room with complaints of increasing shortness of breath. She was found to have:  1. Rapid atrial fibrillation with RVR, new onset. The patient was started on IV Cardizem drip and heparin drip. She was changed to p.o.  long-acting Cardizem CD 2.30 mg, along with beta blockers and digoxin. Xarelto was recommended by cardiology. Her CHADS2 score was of 2. Echo showed ejection fraction of 50% to 55%. The patient will follow up with Dr. Mariah Milling as outpatient. She will likely benefit from outpatient cardioversion if she continues to remain in atrial fibrillation.  2. Hypertension. Continued Cardizem and beta blockers. Her Norvasc was stopped. Her Lotrel was stopped.  3. Acute diastolic congestive heart failure. The patient presented with shortness of breath, elevated BNP and hypoxia. She had good response to IV Lasix. She is set up on home oxygen.  4. Depression continued Effexor.  5. Suspected sleep apnea. The patient is advised to get a sleep study as outpatient.   Hospital stay otherwise remained stable. She remained a full code.   TIME SPENT: 40 minutes.   ____________________________ Wylie Hail Allena Katz, MD sap:sg D: 08/30/2013 07:21:56 ET T: 08/30/2013 11:42:10 ET JOB#: [id number]  cc: Kalaya Infantino A. Allena Katz, MD, <Dictator> Antonieta Iba, MD Marcine Matar., MD  Willow Ora MD ELECTRONICALLY SIGNED 09/16/2013 16:15

## 2014-08-17 NOTE — Consult Note (Signed)
Brief Consult Note: Diagnosis: Newly diagnosed A-fib (onset likely on Friday).   Patient was seen by consultant.   Consult note dictated.   Comments: Continue rate control with Diltiazem.  Echo.  CHADS2 VASc score is 2. Thus, I recommend anticoagulation. Can likely switch Heparin to Xarelto 20 mg once daily tomorrow.  Electronic Signatures: [name] (MD)  (Signed [phone] 18:42)  Authored: Brief Consult Note   Last Updated: 04-May-15 18:42 by [name] (MD)

## 2014-08-19 ENCOUNTER — Ambulatory Visit: Payer: Self-pay | Admitting: Cardiovascular Disease

## 2014-08-27 ENCOUNTER — Other Ambulatory Visit: Payer: Self-pay | Admitting: Cardiovascular Disease

## 2014-09-02 ENCOUNTER — Other Ambulatory Visit: Payer: Self-pay | Admitting: *Deleted

## 2014-09-02 MED ORDER — DILTIAZEM HCL ER COATED BEADS 180 MG PO CP24: 180.0000 mg | ORAL_CAPSULE | Freq: Every day | ORAL | Status: DC

## 2014-09-16 ENCOUNTER — Ambulatory Visit (INDEPENDENT_AMBULATORY_CARE_PROVIDER_SITE_OTHER): Payer: BLUE CROSS/BLUE SHIELD | Admitting: Cardiovascular Disease

## 2014-09-16 ENCOUNTER — Encounter: Payer: Self-pay | Admitting: Cardiovascular Disease

## 2014-09-16 VITALS — BP 162/90 | HR 83 | Ht 62.0 in | Wt 268.0 lb

## 2014-09-16 DIAGNOSIS — I5032 Chronic diastolic (congestive) heart failure: Secondary | ICD-10-CM | POA: Diagnosis not present

## 2014-09-16 DIAGNOSIS — I4891 Unspecified atrial fibrillation: Secondary | ICD-10-CM | POA: Diagnosis not present

## 2014-09-16 DIAGNOSIS — I10 Essential (primary) hypertension: Secondary | ICD-10-CM | POA: Insufficient documentation

## 2014-09-16 DIAGNOSIS — I2510 Atherosclerotic heart disease of native coronary artery without angina pectoris: Secondary | ICD-10-CM

## 2014-09-16 NOTE — Assessment & Plan Note (Signed)
Blood pressure is elevated today but she had a very stressful day at work. Recent blood pressure readings in our office have been normal. Thus, I made no changes to her medications.

## 2014-09-16 NOTE — Assessment & Plan Note (Signed)
She appears to be euvolemic on current dose of Lasix. I will plan on obtaining labs in 6 months.

## 2014-09-16 NOTE — Assessment & Plan Note (Signed)
She is maintaining a normal sinus rhythm on flecainide. Continue long-term anticoagulation with Xarelto.  Continue treatment for sleep apnea.

## 2014-09-16 NOTE — Patient Instructions (Signed)
Medication Instructions: Continue same medications.   Labwork: None.   Procedures/Testing: None.   Follow-Up: 6 months with Dr. Genese Quebedeaux.   Any Additional Special Instructions Will Be Listed Below (If Applicable).   

## 2014-09-16 NOTE — Progress Notes (Signed)
HPI  64 y/o female who is here today for a followup visit regarding persistent atrial fibrillation. She has known history of  HTN and depression.  She was hospitalized at [facility] in May of 2015 with atrial fibrillation with RVR.  She had mild diastolic heart failure and was placed on oral lasix.  Echo showed nl LV fxn and a mildly dilated LA with no significant LVH.   She underwent successful cardioversion in June.  She had recurrent atrial fibrillation complicated by diastolic heart failure which required repeated cardioversions. She was placed on flecainide with no recurrent A. fib since then. She also has been using CPAP for sleep apnea. She has been doing very well with no chest pain, shortness of breath or palpitations.   Allergies  Allergen Reactions  . Levaquin [Levofloxacin In D5w] Other (See Comments)    Severe dizziness   . Metoprolol Itching, Nausea And Vomiting, Rash and Other (See Comments)    Dizziness     Current Outpatient Prescriptions on File Prior to Visit  Medication Sig Dispense Refill  . diltiazem (CARDIZEM CD) 180 MG 24 hr capsule Take 1 capsule (180 mg total) by mouth daily. 30 capsule 3  . flecainide (TAMBOCOR) 100 MG tablet take 1 tablet by mouth twice a day 60 tablet 3  . furosemide (LASIX) 40 MG tablet Take 1 tablet (40 mg total) by mouth daily. 30 tablet 3  . NEXIUM 40 MG capsule take 1 capsule by mouth every morning 30 capsule 3  . venlafaxine (EFFEXOR) 37.5 MG tablet Take 37.5 mg by mouth daily.    Carlena Hurl 20 MG TABS tablet take 1 tablet by mouth once daily WITH SUPPER. 30 tablet 3  . zolpidem (AMBIEN) 10 MG tablet Take 10 mg by mouth at bedtime as needed for sleep.      No current facility-administered medications on file prior to visit.     Past Medical History  Diagnosis Date  . Chronic diastolic CHF (congestive heart failure)     a. Dx 08/2013 in setting of rapid afib;  b. 08/2013 Echo: EF 55-60%, mildly dil LA, nl RV.  Stacey [name] Paroxysmal atrial  fibrillation     a. Dx 08/2013. CHA2DS2VASc = 3 (diast chf, htn, female) -->Xarelto initiated; b. 09/2013 s/p DCCV;  c. 10/2013 recurrent afib->Flecainide added->S/P DCCV;  d. 11/2013 Recurrent afib while off of flecainide (2/2 abnl nuc study)-->Flecainide resumed after nl cath-->DCCV;  e. Recurrent Afib 01/2014-->Flecainide increased to  BID.  Stacey [name] Obesity   . GERD (gastroesophageal reflux disease)   . Hypertension   . Depression   . OSA (obstructive sleep apnea)     a. on CPAP  . Non-obstructive CAD     a. 10/2013 Myoview: anteroseptal, apical, septal mild ischemia, nl LV fxn;  b. Cath: LAD 85m LCX 20ost, RCA 8m.     Past Surgical History  Procedure Laterality Date  . Tonsillectomy    . Ectopic pregnancy surgery    . Cardiac catheterization  10/2013    [facility]     Family History  Problem Relation Age of Onset  . CAD Father     s/p cabg - ? h/o afib.  Stacey [name] Heart attack Father   . Hypertension Mother      History   Social History  . Marital Status: Married    Spouse Name: N/A  . Number of Children: N/A  . Years of Education: N/A   Occupational History  . Not on file.   Social History Main  Topics  . Smoking status: Never Smoker   . Smokeless tobacco: Not on file  . Alcohol Use: Yes     Comment: 1 glass of wine/day.  . Drug Use: No  . Sexual Activity: Not on file   Other Topics Concern  . Not on file   Social History Narrative   Lives in [place].  Works as Water quality scientist.      PHYSICAL EXAM   BP 162/90 mmHg  Pulse 83  Ht 5\' 2"  (1.575 m)  Wt 268 lb (121.564 kg)  BMI 49.01 kg/m2 Constitutional: She is oriented to person, place, and time. She appears well-developed and well-nourished. No distress.  HENT: No nasal discharge.  Head: Normocephalic and atraumatic.  Eyes: Pupils are equal and round. No discharge.  Neck: Normal range of motion. Neck supple. No JVD present. No thyromegaly present.  Cardiovascular: Normal rate, regular rhythm, normal heart sounds.  Exam reveals no gallop and no friction rub. No murmur heard.  Pulmonary/Chest: Effort normal and breath sounds normal. No stridor. No respiratory distress. She has no wheezes. She has no rales. She exhibits no tenderness.  Abdominal: Soft. Bowel sounds are normal. She exhibits no distension. There is no tenderness. There is no rebound and no guarding.  Musculoskeletal: Normal range of motion. She exhibits no edema and no tenderness.  Neurological: She is alert and oriented to person, place, and time. Coordination normal.  Skin: Skin is warm and dry. No rash noted. She is not diaphoretic. No erythema. No pallor.  Psychiatric: She has a normal mood and affect. Her behavior is normal. Judgment and thought content normal.     EKG: Sinus  Rhythm  Low voltage in precordial leads.   -Poor R-wave progression -may be secondary to pulmonary disease   consider old anterior infarct.   -  Nonspecific T-abnormality.   ABNORMAL    ASSESSMENT AND PLAN

## 2014-09-16 NOTE — Assessment & Plan Note (Signed)
Previous cardiac catheterization showed no obstructive coronary artery disease. No symptoms of angina.

## 2014-09-27 ENCOUNTER — Other Ambulatory Visit: Payer: Self-pay

## 2014-09-27 MED ORDER — RIVAROXABAN 20 MG PO TABS: ORAL_TABLET | ORAL | Status: DC

## 2014-09-27 MED ORDER — ESOMEPRAZOLE MAGNESIUM 40 MG PO CPDR: 40.0000 mg | DELAYED_RELEASE_CAPSULE | Freq: Every morning | ORAL | Status: DC

## 2014-09-27 NOTE — Telephone Encounter (Signed)
Refill sent for nexium 40 mg

## 2014-09-27 NOTE — Telephone Encounter (Signed)
Refill sent for xarelto  

## 2014-12-28 ENCOUNTER — Other Ambulatory Visit: Payer: Self-pay | Admitting: Cardiovascular Disease

## 2015-01-25 ENCOUNTER — Other Ambulatory Visit: Payer: Self-pay | Admitting: Cardiovascular Disease

## 2015-01-28 ENCOUNTER — Telehealth: Payer: Self-pay | Admitting: *Deleted

## 2015-01-28 NOTE — Telephone Encounter (Signed)
Please advise 

## 2015-01-28 NOTE — Telephone Encounter (Signed)
Pt calling stating she went to go pick up Xarelto 20 mg from pharmacy and it is now 100 dollar co pay She was wondering if there is a generic form, for that is expensive. Please advise.

## 2015-01-29 ENCOUNTER — Other Ambulatory Visit: Payer: Self-pay

## 2015-01-29 NOTE — Telephone Encounter (Signed)
Left message on home and cell phone for pt to CB concerning xarelto

## 2015-02-03 NOTE — Telephone Encounter (Signed)
Left message on machine for patient to contact the office.   

## 2015-02-04 NOTE — Telephone Encounter (Signed)
Left message on machine for patient to contact the office.   

## 2015-02-05 ENCOUNTER — Telehealth: Payer: Self-pay

## 2015-02-05 NOTE — Telephone Encounter (Signed)
Pt called back asking if there is assistance for Xarelto for her and entresto and eliquis for spouse who is also Dr. Jari Sportsman pt. Provided pt w/websites that offer medication patient assistance. Advised her to check into her prescription benefits to see if there is a lower co-pay for another anti-coag. If so, can see if Dr. Kirke Corin in agreement with changing med.   Advised pt to call back if she needs further assistance. Pt verbalized understanding and is appreciative of the information

## 2015-03-25 ENCOUNTER — Ambulatory Visit: Payer: BLUE CROSS/BLUE SHIELD | Admitting: Cardiovascular Disease

## 2015-04-27 ENCOUNTER — Other Ambulatory Visit: Payer: Self-pay | Admitting: Cardiovascular Disease

## 2015-05-19 ENCOUNTER — Ambulatory Visit: Payer: BLUE CROSS/BLUE SHIELD | Admitting: Cardiovascular Disease

## 2015-05-28 ENCOUNTER — Other Ambulatory Visit: Payer: Self-pay | Admitting: Cardiovascular Disease

## 2015-07-01 ENCOUNTER — Ambulatory Visit (INDEPENDENT_AMBULATORY_CARE_PROVIDER_SITE_OTHER): Payer: BLUE CROSS/BLUE SHIELD | Admitting: Cardiovascular Disease

## 2015-07-01 ENCOUNTER — Encounter: Payer: Self-pay | Admitting: Cardiovascular Disease

## 2015-07-01 ENCOUNTER — Encounter (INDEPENDENT_AMBULATORY_CARE_PROVIDER_SITE_OTHER): Payer: Self-pay

## 2015-07-01 VITALS — BP 144/82 | HR 74 | Ht 63.0 in | Wt 259.0 lb

## 2015-07-01 DIAGNOSIS — I4891 Unspecified atrial fibrillation: Secondary | ICD-10-CM

## 2015-07-01 DIAGNOSIS — I10 Essential (primary) hypertension: Secondary | ICD-10-CM

## 2015-07-01 DIAGNOSIS — I2510 Atherosclerotic heart disease of native coronary artery without angina pectoris: Secondary | ICD-10-CM | POA: Diagnosis not present

## 2015-07-01 MED ORDER — ESOMEPRAZOLE MAGNESIUM 40 MG PO CPDR: 40.0000 mg | DELAYED_RELEASE_CAPSULE | Freq: Every morning | ORAL | Status: DC

## 2015-07-01 MED ORDER — DILTIAZEM HCL ER COATED BEADS 180 MG PO CP24: 180.0000 mg | ORAL_CAPSULE | Freq: Every day | ORAL | Status: DC

## 2015-07-01 MED ORDER — FUROSEMIDE 40 MG PO TABS: 40.0000 mg | ORAL_TABLET | Freq: Every day | ORAL | Status: DC

## 2015-07-01 MED ORDER — RIVAROXABAN 20 MG PO TABS: ORAL_TABLET | ORAL | Status: DC

## 2015-07-01 MED ORDER — FLECAINIDE ACETATE 100 MG PO TABS: 100.0000 mg | ORAL_TABLET | Freq: Two times a day (BID) | ORAL | Status: DC

## 2015-07-01 NOTE — Patient Instructions (Signed)
Medication Instructions:  Your physician recommends that you continue on your current medications as directed. Please refer to the Current Medication list given to you today.   Labwork: BMET, CBC today  Testing/Procedures: none  Follow-Up: Your physician wants you to follow-up in: six months with Dr. Arida.  You will receive a reminder letter in the mail two months in advance. If you don't receive a letter, please call our office to schedule the follow-up appointment.   Any Other Special Instructions Will Be Listed Below (If Applicable).     If you need a refill on your cardiac medications before your next appointment, please call your pharmacy.   

## 2015-07-01 NOTE — Progress Notes (Signed)
Cardiology Office Note   Date:  07/01/2015   ID:  Stacey [name], DOB 05-23-1950, MRN [id number]  PCP:  Marguarite Arbour, MD  Cardiologist:   [name], MD   Chief Complaint  Patient presents with  . other    6 month follow up. Meds reviewed by the patient verbally. "doing well."       History of Present Illness: Stacey [name] is a 65 y.o. female who presents for a follow-up visit regarding persistent atrial fibrillation and chronic diastolic heart failure. She has known history of  HTN and depression.   Echocardiogram in 2015 showed nl LV fxn and a mildly dilated LA with no significant LVH.   Cardiac cath in 2015 showed mild nonobstructive coronary artery disease. She underwent multiple cardioversions but she has been maintaining in sinus rhythm on flecainide She also has been using CPAP for sleep apnea. She has been doing very well with no chest pain, shortness of breath or palpitations.   Past Medical History  Diagnosis Date  . Chronic diastolic CHF (congestive heart failure) (HCC)     a. Dx 08/2013 in setting of rapid afib;  b. 08/2013 Echo: EF 55-60%, mildly dil LA, nl RV.  Marland Kitchen Paroxysmal atrial fibrillation (HCC)     a. Dx 08/2013. CHA2DS2VASc = 3 (diast chf, htn, female) -->Xarelto initiated; b. 09/2013 s/p DCCV;  c. 10/2013 recurrent afib->Flecainide added->S/P DCCV;  d. 11/2013 Recurrent afib while off of flecainide (2/2 abnl nuc study)-->Flecainide resumed after nl cath-->DCCV;  e. Recurrent Afib 01/2014-->Flecainide increased to  BID.  Marland Kitchen Obesity   . GERD (gastroesophageal reflux disease)   . Hypertension   . Depression   . OSA (obstructive sleep apnea)     a. on CPAP  . Non-obstructive CAD     a. 10/2013 Myoview: anteroseptal, apical, septal mild ischemia, nl LV fxn;  b. Cath: LAD 17m LCX 20ost, RCA 40m.    Past Surgical History  Procedure Laterality Date  . Tonsillectomy    . Ectopic pregnancy surgery    . Cardiac catheterization  10/2013    [facility]      Current Outpatient Prescriptions  Medication Sig Dispense Refill  . diltiazem (CARDIZEM CD) 180 MG 24 hr capsule take 1 capsule by mouth once daily 30 capsule 3  . esomeprazole (NEXIUM) 40 MG capsule take 1 capsule by mouth every morning 30 capsule 3  . flecainide (TAMBOCOR) 100 MG tablet take 1 tablet by mouth twice a day 60 tablet 3  . furosemide (LASIX) 40 MG tablet take 1 tablet by mouth daily 30 tablet 3  . traMADol (ULTRAM) 50 MG tablet Take 50 mg by mouth every 6 (six) hours as needed.    . venlafaxine (EFFEXOR) 37.5 MG tablet Take 37.5 mg by mouth daily.    Carlena Hurl 20 MG TABS tablet take 1 tablet by mouth once daily WITH SUPPER 30 tablet 3  . zolpidem (AMBIEN) 10 MG tablet Take 10 mg by mouth at bedtime as needed for sleep.      No current facility-administered medications for this visit.    Allergies:   Levaquin and Metoprolol    Social History:  The patient  reports that she has never smoked. She does not have any smokeless tobacco history on file. She reports that she drinks alcohol. She reports that she does not use illicit drugs.   Family History:  The patient's family history includes CAD in her father; Heart attack in her father; Hypertension in her mother.  ROS:  Please see the history of present illness.   Otherwise, review of systems are positive for none.   All other systems are reviewed and negative.    PHYSICAL EXAM: VS:  BP 144/82 mmHg  Pulse 74  Ht 5\' 3"  (1.6 m)  Wt 259 lb (117.482 kg)  BMI 45.89 kg/m2 , BMI Body mass index is 45.89 kg/(m^2). GEN: Well nourished, well developed, in no acute distress HEENT: normal Neck: no JVD, carotid bruits, or masses Cardiac: RRR; no  rubs, or gallops,no edema . There is one out of 6 systolic ejection murmur in the aortic area Respiratory:  clear to auscultation bilaterally, normal work of breathing GI: soft, nontender, nondistended, + BS MS: no deformity or atrophy Skin: warm and dry, no rash Neuro:   Strength and sensation are intact Psych: euthymic mood, full affect   EKG:  EKG is ordered today. The ekg ordered today demonstrates normal sinus rhythm with normal PR and QT intervals   Recent Labs: No results found for requested labs within last 365 days.    Lipid Panel    Component Value Date/Time   CHOL 194 08/28/2013 0352   TRIG 122 08/28/2013 0352   HDL 56 08/28/2013 0352   VLDL 24 08/28/2013 0352   LDLCALC 114* 08/28/2013 0352      Wt Readings from Last 3 Encounters:  07/01/15 259 lb (117.482 kg)  09/16/14 268 lb (121.564 kg)  02/19/14 260 lb (117.935 kg)         ASSESSMENT AND PLAN:  1.  Persistent atrial fibrillation: Maintaining in sinus rhythm on flecainide. She has been doing well overall. She is tolerating anticoagulation with Xarelto. She was able to get assistance with her co-pays. Given that she is on anticoagulation, I requested CBC.  2. Chronic diastolic heart failure: She appears to be euvolemic on current dose of furosemide. I requested basic metabolic profile.  3. Essential hypertension: Blood pressure is reasonably controlled on diltiazem.  4. Obstructive sleep apnea: Doing well with CPAP.      Disposition:   FU with me in 6 months  Signed,  [name], MD  07/01/2015 3:58 PM    [facility]

## 2015-07-02 LAB — CBC
Hematocrit: 38.2 % (ref 34.0–46.6)
Hemoglobin: 12.2 g/dL (ref 11.1–15.9)
MCH: 28.8 pg (ref 26.6–33.0)
MCHC: 31.9 g/dL (ref 31.5–35.7)
MCV: 90 fL (ref 79–97)
PLATELETS: 241 10*3/uL (ref 150–379)
RBC: 4.24 x10E6/uL (ref 3.77–5.28)
RDW: 15.8 % — ABNORMAL HIGH (ref 12.3–15.4)
WBC: 6.5 10*3/uL (ref 3.4–10.8)

## 2015-07-02 LAB — BASIC METABOLIC PANEL
BUN / CREAT RATIO: 14 (ref 11–26)
BUN: 17 mg/dL (ref 8–27)
CO2: 20 mmol/L (ref 18–29)
CREATININE: 1.23 mg/dL — AB (ref 0.57–1.00)
Calcium: 9.5 mg/dL (ref 8.7–10.3)
Chloride: 98 mmol/L (ref 96–106)
GFR, EST AFRICAN AMERICAN: 54 mL/min/{1.73_m2} — AB (ref >59)
GFR, EST NON AFRICAN AMERICAN: 47 mL/min/{1.73_m2} — AB (ref >59)
Glucose: 98 mg/dL (ref 65–99)
Potassium: 4.9 mmol/L (ref 3.5–5.2)
SODIUM: 141 mmol/L (ref 134–144)

## 2015-12-25 ENCOUNTER — Other Ambulatory Visit: Payer: Self-pay | Admitting: Cardiovascular Disease

## 2015-12-26 ENCOUNTER — Encounter: Payer: Self-pay | Admitting: Cardiovascular Disease

## 2015-12-26 ENCOUNTER — Encounter (INDEPENDENT_AMBULATORY_CARE_PROVIDER_SITE_OTHER): Payer: Self-pay

## 2015-12-26 ENCOUNTER — Ambulatory Visit (INDEPENDENT_AMBULATORY_CARE_PROVIDER_SITE_OTHER): Payer: 59 | Admitting: Cardiovascular Disease

## 2015-12-26 VITALS — BP 140/84 | HR 72 | Ht 63.0 in | Wt 250.0 lb

## 2015-12-26 DIAGNOSIS — I5032 Chronic diastolic (congestive) heart failure: Secondary | ICD-10-CM

## 2015-12-26 DIAGNOSIS — I4891 Unspecified atrial fibrillation: Secondary | ICD-10-CM

## 2015-12-26 DIAGNOSIS — I10 Essential (primary) hypertension: Secondary | ICD-10-CM | POA: Diagnosis not present

## 2015-12-26 NOTE — Patient Instructions (Signed)
Medication Instructions: Continue same medications.   Labwork: None.   Procedures/Testing: None.   Follow-Up: 6 months with Dr. Arida.   Any Additional Special Instructions Will Be Listed Below (If Applicable).     If you need a refill on your cardiac medications before your next appointment, please call your pharmacy.   

## 2015-12-26 NOTE — Progress Notes (Signed)
Cardiology Office Note   Date:  12/26/2015   ID:  Stacey [name], DOB 10/04/1950, MRN [id number]  PCP:  Marguarite ArbourSPARKS,JEFFREY D, MD  Cardiologist:   Lorine BearsMuhammad Anand Tejada, MD   Chief Complaint  Patient presents with  . Other    6 month follow up. Meds reviewed by the patient verbally.       History of Present Illness: Stacey [name] is a 65 y.o. female who presents for a follow-up visit regarding persistent atrial fibrillation and chronic diastolic heart failure. She has known history of  HTN and depression.   Echocardiogram in 2015 showed nl LV fxn and a mildly dilated LA with no significant LVH.   Cardiac cath in 2015 showed mild nonobstructive coronary artery disease. She underwent multiple cardioversions but she has been maintaining in sinus rhythm on flecainide She has been doing very well with no chest pain, shortness of breath or palpitations. She is trying to lose weight and has lost 9 pounds since her last visit. She uses a CPAP regularly for sleep apnea.   Past Medical History:  Diagnosis Date  . Chronic diastolic CHF (congestive heart failure) (HCC)    a. Dx 08/2013 in setting of rapid afib;  b. 08/2013 Echo: EF 55-60%, mildly dil LA, nl RV.  Marland Kitchen. Depression   . GERD (gastroesophageal reflux disease)   . Hypertension   . Non-obstructive CAD    a. 10/2013 Myoview: anteroseptal, apical, septal mild ischemia, nl LV fxn;  b. Cath: LAD 8068m LCX 20ost, RCA 6768m.  . Obesity   . OSA (obstructive sleep apnea)    a. on CPAP  . Paroxysmal atrial fibrillation (HCC)    a. Dx 08/2013. CHA2DS2VASc = 3 (diast chf, htn, female) -->Xarelto initiated; b. 09/2013 s/p DCCV;  c. 10/2013 recurrent afib->Flecainide added->S/P DCCV;  d. 11/2013 Recurrent afib while off of flecainide (2/2 abnl nuc study)-->Flecainide resumed after nl cath-->DCCV;  e. Recurrent Afib 01/2014-->Flecainide increased to 150mg  BID.    Past Surgical History:  Procedure Laterality Date  . CARDIAC CATHETERIZATION  10/2013   [facility]  .  ECTOPIC PREGNANCY SURGERY    . TONSILLECTOMY       Current Outpatient Prescriptions  Medication Sig Dispense Refill  . diltiazem (CARDIZEM CD) 180 MG 24 hr capsule Take 1 capsule (180 mg total) by mouth daily. 30 capsule 5  . esomeprazole (NEXIUM) 40 MG capsule Take 1 capsule (40 mg total) by mouth every morning. 30 capsule 3  . flecainide (TAMBOCOR) 100 MG tablet take 1 tablet by mouth twice a day 60 tablet 0  . furosemide (LASIX) 40 MG tablet take 1 tablet by mouth once daily 30 tablet 0  . rivaroxaban (XARELTO) 20 MG TABS tablet take 1 tablet by mouth once daily WITH SUPPER 30 tablet 3  . traMADol (ULTRAM) 50 MG tablet Take 50 mg by mouth every 6 (six) hours as needed.    . venlafaxine (EFFEXOR) 37.5 MG tablet Take 37.5 mg by mouth daily.    Marland Kitchen. zolpidem (AMBIEN) 10 MG tablet Take 10 mg by mouth at bedtime as needed for sleep.      No current facility-administered medications for this visit.     Allergies:   Levaquin [levofloxacin in d5w] and Metoprolol    Social History:  The patient  reports that she has never smoked. She has never used smokeless tobacco. She reports that she drinks alcohol. She reports that she does not use drugs.   Family History:  The patient's family history includes CAD  in her father; Heart attack in her father; Hypertension in her mother.    ROS:  Please see the history of present illness.   Otherwise, review of systems are positive for none.   All other systems are reviewed and negative.    PHYSICAL EXAM: VS:  BP 140/84 (BP Location: Left Arm, Patient Position: Sitting, Cuff Size: Large)   Pulse 72   Ht 5\' 3"  (1.6 m)   Wt 250 lb (113.4 kg)   BMI 44.29 kg/m  , BMI Body mass index is 44.29 kg/m. GEN: Well nourished, well developed, in no acute distress HEENT: normal Neck: no JVD, carotid bruits, or masses Cardiac: RRR; no  rubs, or gallops,no edema . There is one out of 6 systolic ejection murmur in the aortic area Respiratory:  clear to auscultation  bilaterally, normal work of breathing GI: soft, nontender, nondistended, + BS MS: no deformity or atrophy Skin: warm and dry, no rash Neuro:  Strength and sensation are intact Psych: euthymic mood, full affect   EKG:  EKG is ordered today. The ekg ordered today demonstrates normal sinus rhythm with normal PR and QT intervals   Recent Labs: 07/01/2015: BUN 17; Creatinine, Ser 1.23; Platelets 241; Potassium 4.9; Sodium 141    Lipid Panel    Component Value Date/Time   CHOL 194 08/28/2013 0352   TRIG 122 08/28/2013 0352   HDL 56 08/28/2013 0352   VLDL 24 08/28/2013 0352   LDLCALC 114 (H) 08/28/2013 0352      Wt Readings from Last 3 Encounters:  12/26/15 250 lb (113.4 kg)  07/01/15 259 lb (117.5 kg)  09/16/14 268 lb (121.6 kg)         ASSESSMENT AND PLAN:  1.  Persistent atrial fibrillation: Maintaining in sinus rhythm on flecainide. She has been doing well overall. She is tolerating anticoagulation with Xarelto.  Labs 6 months ago showed normal CBC and stable renal function. Continue same medications.  2. Chronic diastolic heart failure: She appears to be euvolemic on current dose of furosemide. I 3. Essential hypertension: Blood pressure is reasonably controlled on diltiazem.  4. Obstructive sleep apnea: Doing well with CPAP.      Disposition:   FU with me in 6 months  Signed,  [name], MD  12/26/2015 11:25 AM    [facility]

## 2016-01-06 IMAGING — CR DG CHEST 1V PORT
1 series · 1 of 1 positions shown · non-contrast
Comparison: None.

CLINICAL DATA: Chest pain, shortness of breath

EXAM:
PORTABLE CHEST - 1 VIEW

[ap]
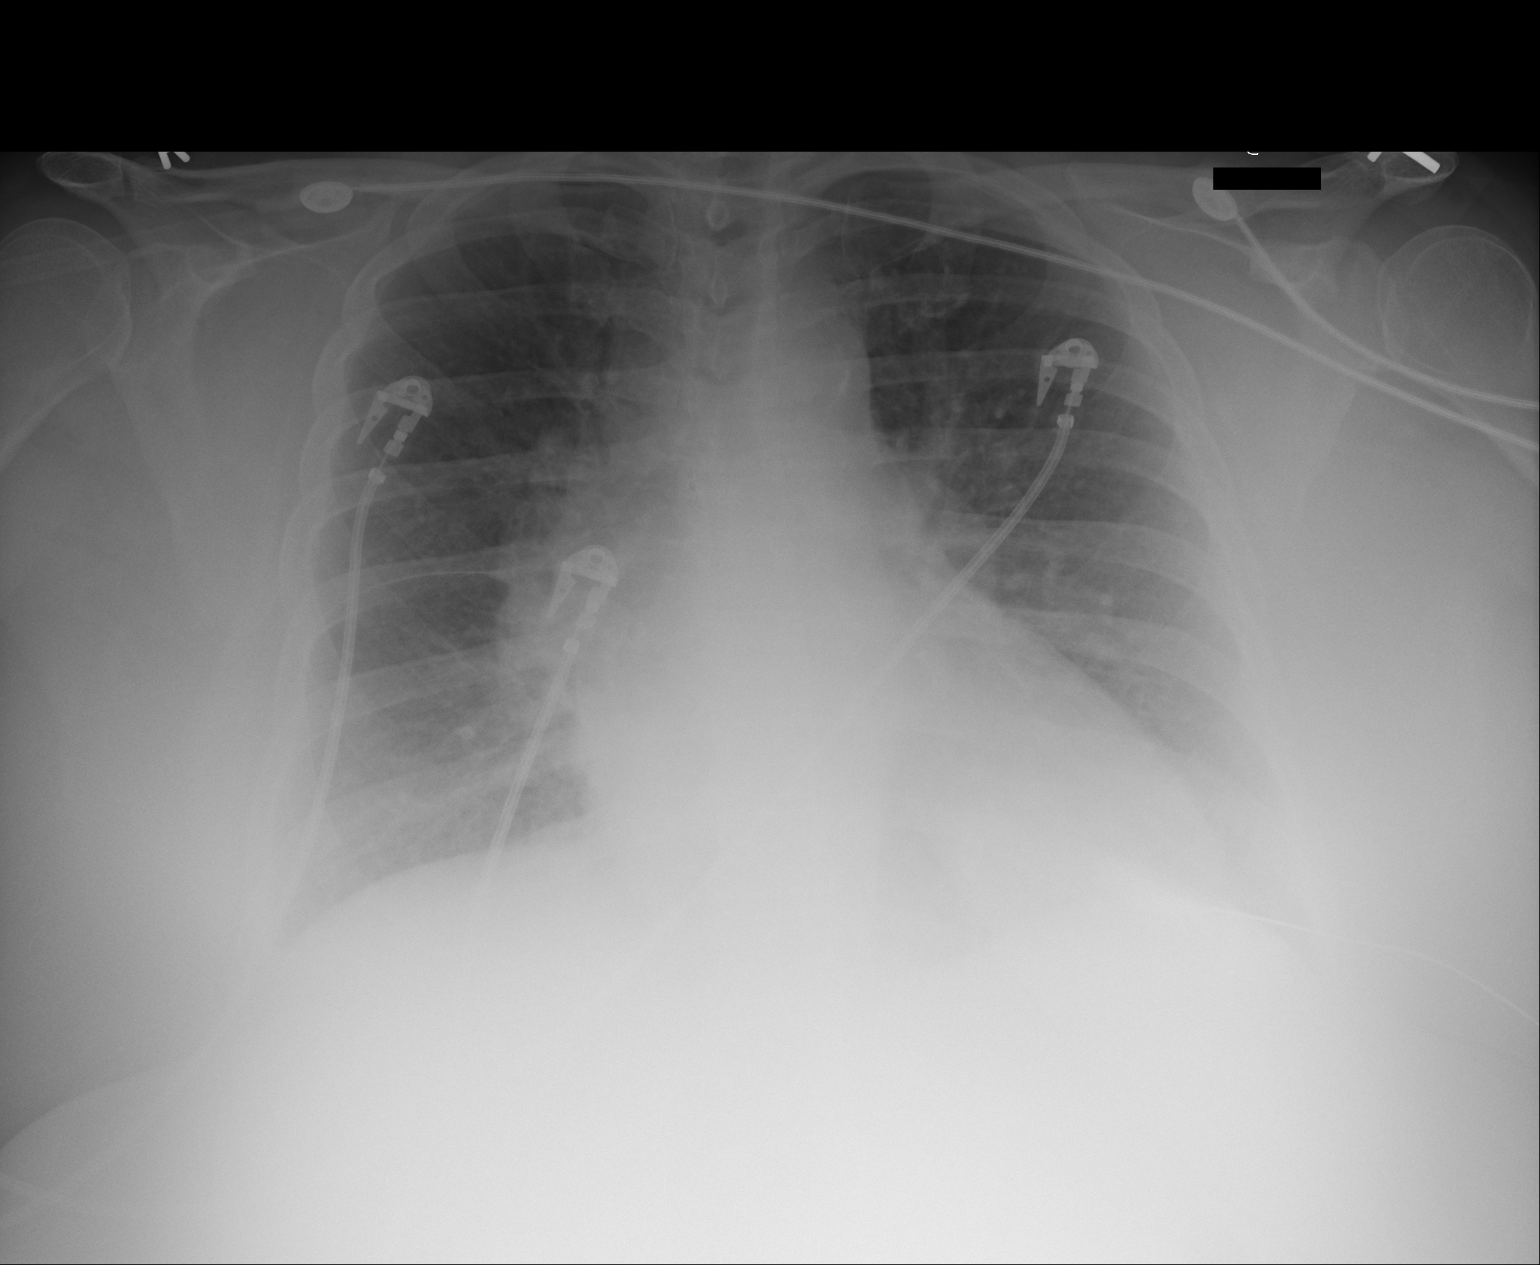

[1 of 1 positions shown; findings below may reference images not displayed]

FINDINGS: Lungs are essentially clear.  No pleural effusion or pneumothorax.

Mild prominence of the right pulmonary hilum, likely vascular.

The heart is normal in size.
IMPRESSION: No evidence of acute cardiopulmonary disease.

Mild prominence of the right pulmonary hilum, likely vascular.
Consider follow-up PA/lateral chest radiographs for further
evaluation.

## 2016-01-23 ENCOUNTER — Other Ambulatory Visit: Payer: Self-pay | Admitting: Cardiovascular Disease

## 2016-01-23 MED ORDER — DILTIAZEM HCL ER COATED BEADS 180 MG PO CP24: 180.0000 mg | ORAL_CAPSULE | Freq: Every day | ORAL | 3 refills | Status: DC

## 2016-01-23 MED ORDER — ESOMEPRAZOLE MAGNESIUM 40 MG PO CPDR: 40.0000 mg | DELAYED_RELEASE_CAPSULE | Freq: Every morning | ORAL | 3 refills | Status: DC

## 2016-01-23 MED ORDER — FUROSEMIDE 40 MG PO TABS: 40.0000 mg | ORAL_TABLET | Freq: Every day | ORAL | 3 refills | Status: DC

## 2016-03-26 IMAGING — CR DG CHEST 2V
1 series · 2 of 2 positions shown · non-contrast
Comparison: Chest x-ray 08/27/2013.

CLINICAL DATA: Shortness of breath.

EXAM:
CHEST  2 VIEW

[Series 1: w chest pa · 0.14mm/px · 2 of 2 slices shown]
[im 1/2]
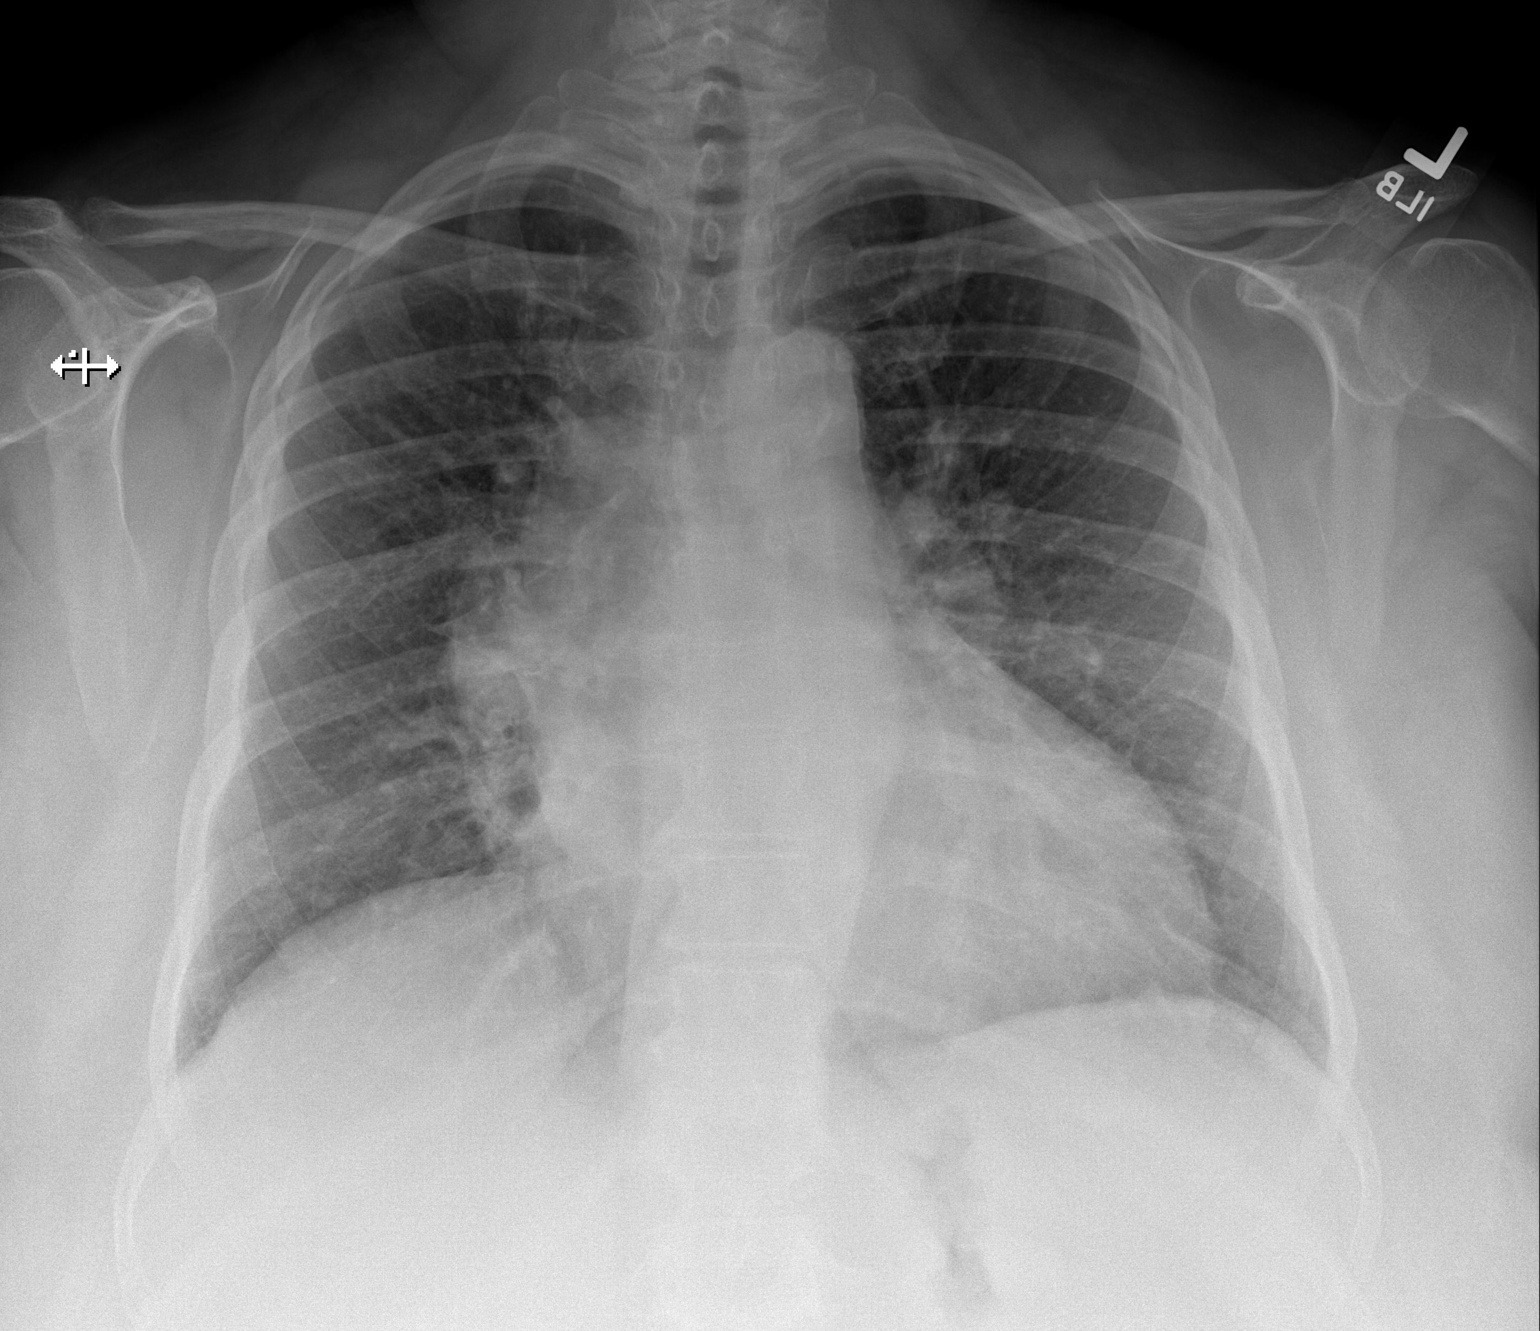
[im 2/2]
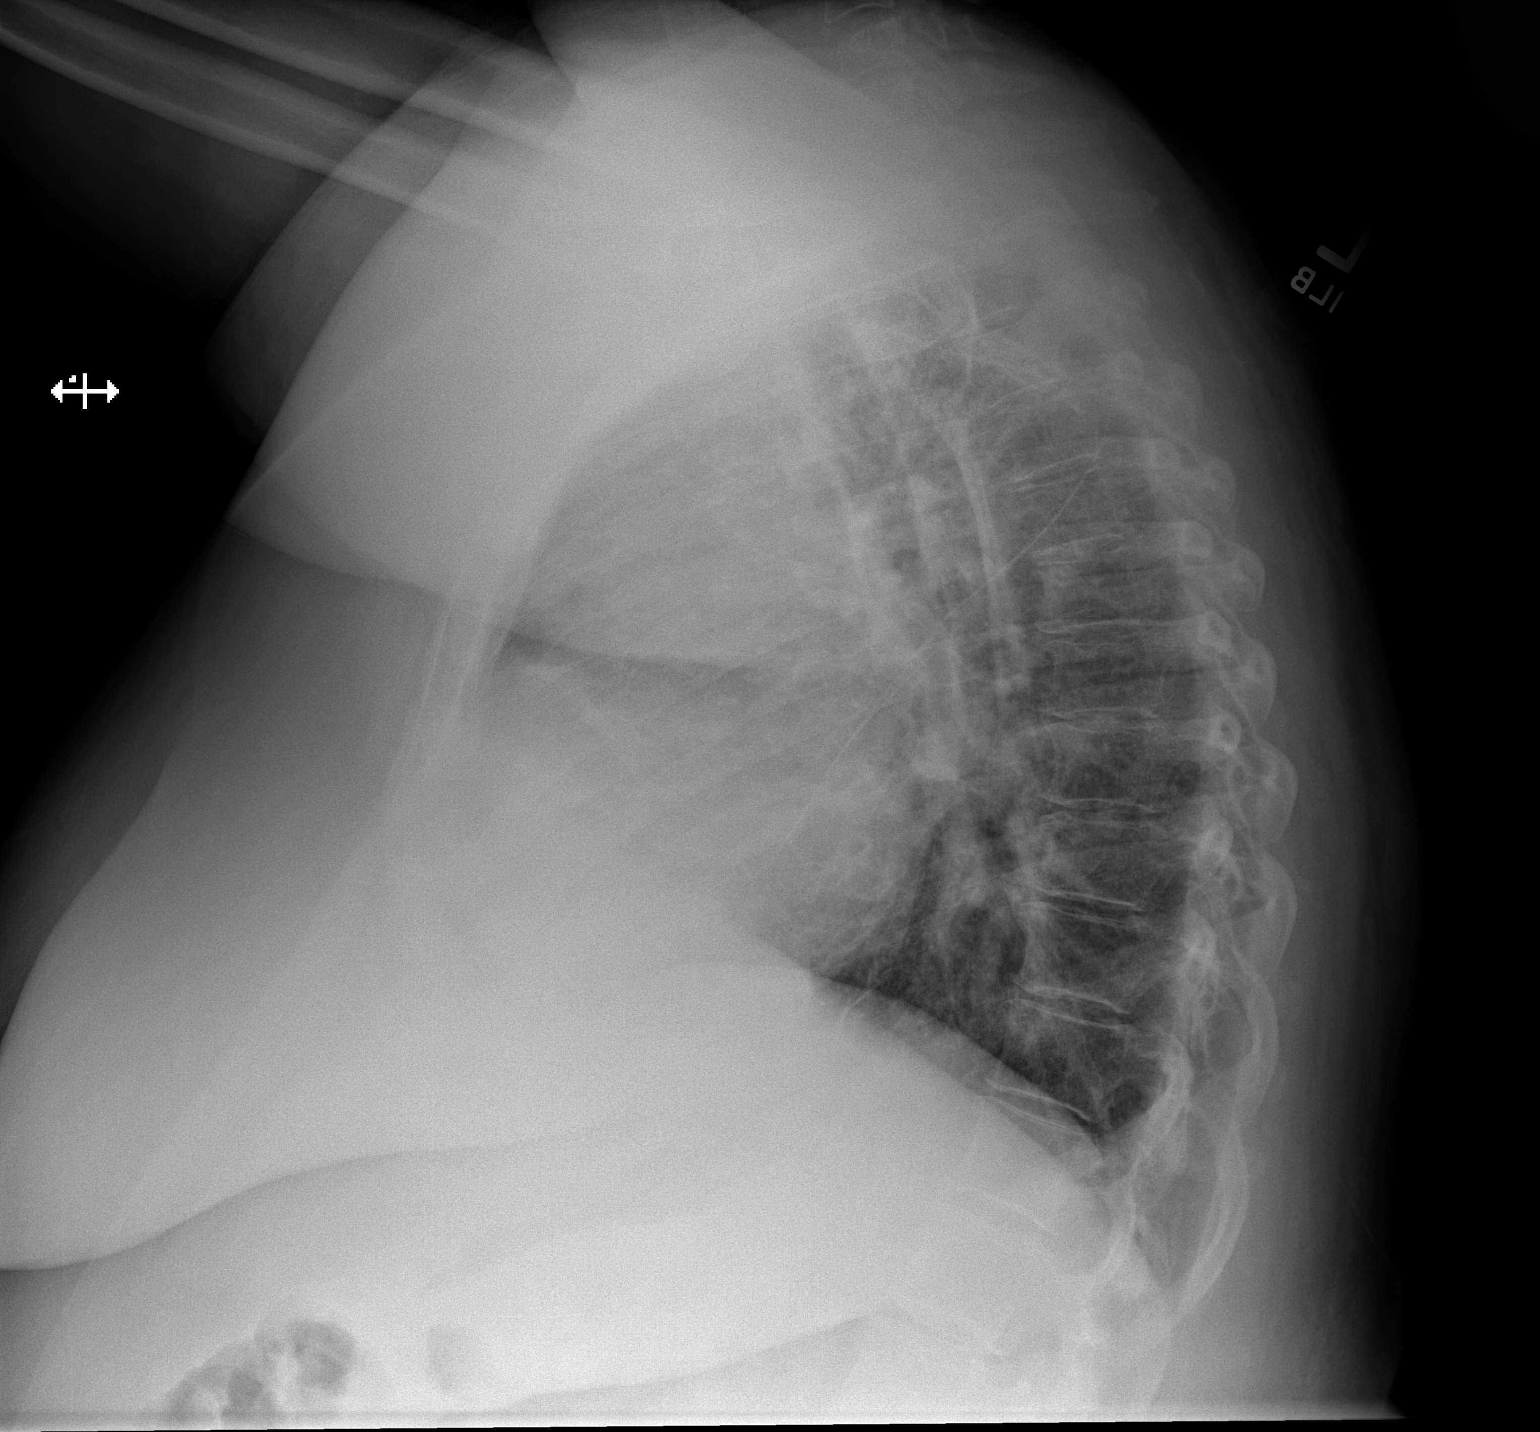

[2 of 2 positions shown; findings below may reference images not displayed]

FINDINGS: Mediastinum and hilar structures are normal. Cardiomegaly with
pulmonary vascular prominence noted. Mild interstitial prominence,
no overt pulmonary alveolar edema. No pleural effusion or
pneumothorax. No acute bony abnormality identified.
IMPRESSION: Cardiomegaly with mild CHF.

## 2016-05-18 ENCOUNTER — Other Ambulatory Visit: Payer: Self-pay | Admitting: Cardiovascular Disease

## 2016-06-17 ENCOUNTER — Other Ambulatory Visit: Payer: Self-pay | Admitting: Cardiovascular Disease

## 2016-08-18 ENCOUNTER — Ambulatory Visit
Admission: RE | Admit: 2016-08-18 | Discharge: 2016-08-18 | Disposition: A | Discharge status: Home / Self Care | Payer: 59 | Source: Ambulatory Visit | Attending: Physician Assistant | Admitting: Physician Assistant

## 2016-08-18 ENCOUNTER — Other Ambulatory Visit: Payer: Self-pay | Admitting: Physician Assistant

## 2016-08-18 DIAGNOSIS — M7989 Other specified soft tissue disorders: Secondary | ICD-10-CM | POA: Diagnosis present

## 2016-08-18 DIAGNOSIS — M79604 Pain in right leg: Secondary | ICD-10-CM

## 2016-08-19 ENCOUNTER — Other Ambulatory Visit: Payer: Self-pay | Admitting: Internal Medicine

## 2016-08-19 DIAGNOSIS — Z1231 Encounter for screening mammogram for malignant neoplasm of breast: Secondary | ICD-10-CM

## 2016-09-10 ENCOUNTER — Encounter: Payer: Self-pay | Admitting: Cardiovascular Disease

## 2016-09-10 ENCOUNTER — Ambulatory Visit (INDEPENDENT_AMBULATORY_CARE_PROVIDER_SITE_OTHER): Payer: 59 | Admitting: Cardiovascular Disease

## 2016-09-10 VITALS — BP 136/96 | HR 83 | Ht 63.0 in | Wt 243.0 lb

## 2016-09-10 DIAGNOSIS — I480 Paroxysmal atrial fibrillation: Secondary | ICD-10-CM

## 2016-09-10 MED ORDER — POTASSIUM CHLORIDE ER 10 MEQ PO TBCR: 10.0000 meq | EXTENDED_RELEASE_TABLET | Freq: Every day | ORAL | 3 refills | Status: DC

## 2016-09-10 NOTE — Patient Instructions (Addendum)
Medication Instructions:   Please start potassium one a day for low potassium  Labwork:  No new labs needed  Testing/Procedures:  No further testing at this time  Research CT coronary calcium score  $150   I recommend watching educational videos on topics of interest to you at:       [email or web address]  Enter code: [email or web address]    Follow-Up: It was a pleasure seeing you in the office today. Please call us if you have new issues that need to be addressed before your next appt.  [phone]  Your physician wants you to follow-up in: 6 months with Dr. Katheren Puller will receive a reminder letter in the mail two months in advance. If you don't receive a letter, please call our office to schedule the follow-up appointment.  If you need a refill on your cardiac medications before your next appointment, please call your pharmacy.

## 2016-09-10 NOTE — Progress Notes (Signed)
Cardiology Office Note   Date:  09/10/2016   ID:  Catrice Zuleta, DOB [birth year], MRN [id number]  PCP:  Marguarite Arbour, MD  Cardiologist:   Julien Nordmann, MD   Chief Complaint  Patient presents with  . other    6 month follow up. Patient denies chest pain and SOB. MEds reviewed verbally with patient.     History of Present Illness:  Stacey [name] is a 66 y.o. female who presents for a follow-up visit regarding Paroxysmal atrial fibrillation  chronic diastolic heart failure HTN  depression.   Sleep apnea, uses her CPAP Echocardiogram in 2015 showed nl LV fxn and a mildly dilated LA with no significant LVH.   Cardiac cath in 2015 showed mild nonobstructive coronary artery disease.  multiple cardioversions She presents for routine follow-up of her atrial fibrillation and coronary disease, hyperlipidemia, diastolic CHF  maintaining sinus rhythm on flecainide 100 mg twice a day  Recent fall 3 weeks ago while she was at home Fracture left arm large hematoma right leg Seen by orthopedics for the large hematoma, this is slowly improving  Lab work reviewed with her showing climb in her cholesterol over the past several years now total cholesterol 240 She does not want a statin Previous cardiac catheterization results discussed with her, diffuse 20% disease  Potassium low 3.5 this year as well as last year, she is on Lasix Currently not on potassium supplement   She uses a CPAP regularly for sleep apnea.   Past Medical History:  Diagnosis Date  . Chronic diastolic CHF (congestive heart failure) (HCC)    a. Dx 08/2013 in setting of rapid afib;  b. 08/2013 Echo: EF 55-60%, mildly dil LA, nl RV.  Marland Kitchen Depression   . GERD (gastroesophageal reflux disease)   . Hypertension   . Non-obstructive CAD    a. 10/2013 Myoview: anteroseptal, apical, septal mild ischemia, nl LV fxn;  b. Cath: LAD 69m LCX 20ost, RCA 71m.  . Obesity   . OSA (obstructive sleep apnea)    a. on CPAP  .  Paroxysmal atrial fibrillation (HCC)    a. Dx 08/2013. CHA2DS2VASc = 3 (diast chf, htn, female) -->Xarelto initiated; b. 09/2013 s/p DCCV;  c. 10/2013 recurrent afib->Flecainide added->S/P DCCV;  d. 11/2013 Recurrent afib while off of flecainide (2/2 abnl nuc study)-->Flecainide resumed after nl cath-->DCCV;  e. Recurrent Afib 01/2014-->Flecainide increased to 150mg  BID.    Past Surgical History:  Procedure Laterality Date  . CARDIAC CATHETERIZATION  10/2013   [facility]  . ECTOPIC PREGNANCY SURGERY    . TONSILLECTOMY       Current Outpatient Prescriptions  Medication Sig Dispense Refill  . CARTIA XT 180 MG 24 hr capsule take 1 capsule by mouth once daily 30 capsule 3  . esomeprazole (NEXIUM) 40 MG capsule Take 1 capsule (40 mg total) by mouth every morning. 30 capsule 3  . flecainide (TAMBOCOR) 100 MG tablet take 1 tablet by mouth twice a day 60 tablet 3  . furosemide (LASIX) 40 MG tablet take 1 tablet by mouth once daily 30 tablet 3  . HYDROcodone-acetaminophen (NORCO) 10-325 MG tablet Take 10-325 tablets by mouth every 6 (six) hours as needed.  0  . venlafaxine (EFFEXOR) 37.5 MG tablet Take 37.5 mg by mouth daily.    Carlena Hurl 20 MG TABS tablet take 1 tablet by mouth once daily with SUPPER 30 tablet 3  . zolpidem (AMBIEN) 10 MG tablet Take 10 mg by mouth at bedtime as needed for  sleep.     . potassium chloride (K-DUR) 10 MEQ tablet Take 1 tablet (10 mEq total) by mouth daily. 90 tablet 3   No current facility-administered medications for this visit.     Allergies:   Levaquin [levofloxacin in d5w] and Metoprolol    Social History:  The patient  reports that she has never smoked. She has never used smokeless tobacco. She reports that she drinks alcohol. She reports that she does not use drugs.   Family History:  The patient's family history includes CAD in her father; Heart attack in her father; Hypertension in her mother.    ROS:   Review of Systems  Constitutional: Negative.     Respiratory: Negative.   Cardiovascular: Negative.   Gastrointestinal: Negative.   Musculoskeletal: Negative.   Neurological: Negative.   Psychiatric/Behavioral: Negative.   All other systems reviewed and are negative.    PHYSICAL EXAM: VS:  BP (!) 136/96 (BP Location: Right Arm, Patient Position: Sitting, Cuff Size: Normal)   Pulse 83   Ht 5\' 3"  (1.6 m)   Wt 243 lb (110.2 kg)   BMI 43.05 kg/m  , BMI Body mass index is 43.05 kg/m.  GEN: Well nourished, well developed, in no acute distress , Obese HEENT: normal  Neck: no JVD, carotid bruits, or masses Cardiac: RRR; no  rubs, or gallops,no edema . There is one out of 6 systolic ejection murmur in the aortic area Respiratory:  clear to auscultation bilaterally, normal work of breathing GI: soft, nontender, nondistended, + BS MS: no deformity or atrophy  Skin: warm and dry, no rash, large hematoma noted right lateral shin 6 x 3 cm Neuro:  Strength and sensation are intact Psych: euthymic mood, full affect   EKG:   EKG personally reviewed by myself on todays visit Shows normal sinus rhythm with rate 83 bpm poor R-wave progression through the anterior precordial leads otherwise no significant ST or T-wave changes   Recent Labs: No results found for requested labs within last 8760 hours.    Lipid Panel    Component Value Date/Time   CHOL 194 08/28/2013 0352   TRIG 122 08/28/2013 0352   HDL 56 08/28/2013 0352   VLDL 24 08/28/2013 0352   LDLCALC 114 (H) 08/28/2013 0352      Wt Readings from Last 3 Encounters:  09/10/16 243 lb (110.2 kg)  12/26/15 250 lb (113.4 kg)  07/01/15 259 lb (117.5 kg)         ASSESSMENT AND PLAN:  1.  Persistent atrial fibrillation:  Maintaining sinus rhythm on flecainide.  Recent fall with large hematoma, left arm fracture Recommended if she has any traumatic falls in the future that she stop her Xarelto for brief period of time until she determines the extent of the trauma  2.  Chronic diastolic heart failure:  She appears to be euvolemic on current dose of furosemide.  Recommended she add potassium 10 mEq at least 4 days per week Most recent potassium 3.5  3. Essential hypertension:  Diastolic pressure elevated Recommended she monitor blood pressure at home and call our office if this continues to run high  4. Obstructive sleep apnea:  Doing well with CPAP.  5. Morbid obesity We have encouraged continued exercise, careful diet management in an effort to lose weight.   Total encounter time more than 25 minutes  Greater than 50% was spent in counseling and coordination of care with the patient  Disposition:   FU  in 6 months with  Dr. Kirke Corin  Signed,  Julien Nordmann, MD  09/10/2016 3:44 PM    [facility]

## 2016-09-16 ENCOUNTER — Other Ambulatory Visit: Payer: Self-pay | Admitting: Cardiovascular Disease

## 2016-10-14 ENCOUNTER — Ambulatory Visit
Admission: RE | Admit: 2016-10-14 | Discharge: 2016-10-14 | Disposition: A | Discharge status: Home / Self Care | Payer: 59 | Source: Ambulatory Visit | Attending: Internal Medicine | Admitting: Internal Medicine

## 2016-10-14 DIAGNOSIS — Z1231 Encounter for screening mammogram for malignant neoplasm of breast: Secondary | ICD-10-CM | POA: Diagnosis present

## 2016-10-17 ENCOUNTER — Other Ambulatory Visit: Payer: Self-pay | Admitting: Cardiovascular Disease

## 2017-01-19 ENCOUNTER — Other Ambulatory Visit: Payer: Self-pay | Admitting: Cardiovascular Disease

## 2017-02-17 ENCOUNTER — Other Ambulatory Visit: Payer: Self-pay | Admitting: Cardiovascular Disease

## 2017-03-21 ENCOUNTER — Telehealth: Payer: Self-pay | Admitting: Cardiovascular Disease

## 2017-03-21 NOTE — Telephone Encounter (Signed)
Pt calling stating she just lost her job and is asking for some advise  She needs to pick up her Kathryne Hitch from [facility] on [address]. But is asking if we can please send in a generic for this and also if we have samples for her   Please advise

## 2017-03-21 NOTE — Telephone Encounter (Signed)
Can you please advise.  I didn't think there was a generic for Xarelto.

## 2017-03-22 NOTE — Telephone Encounter (Signed)
Pt recently lost her job and xarelto now cost $500/month. She is looking for a gap plan to help until she is eligible for medicare in March 2019. She has one tablet left. I have left 3 bottles at the front desk for pick up to help while she is looking for insurance. She understands we are unable to provide xarelto on a monthly basis. We discussed possibly changing to coumadin but pt is not interested. Provided [facility] information.  Medication Samples have been provided to the patient.  Samples of xarelto were given to the patient, quantity 3 bottles, Lot Number [id number] exp: 3/21

## 2017-03-22 NOTE — Telephone Encounter (Signed)
Left message on pt's home and cell VM to contact the office.  

## 2017-05-13 ENCOUNTER — Ambulatory Visit: Payer: 59 | Admitting: Cardiovascular Disease

## 2017-05-22 ENCOUNTER — Other Ambulatory Visit: Payer: Self-pay | Admitting: Cardiovascular Disease

## 2017-06-10 ENCOUNTER — Other Ambulatory Visit: Payer: Self-pay | Admitting: Cardiovascular Disease

## 2017-06-10 MED ORDER — DILTIAZEM HCL ER COATED BEADS 180 MG PO CP24: 180.0000 mg | ORAL_CAPSULE | Freq: Every day | ORAL | 3 refills | Status: DC

## 2017-06-10 NOTE — Telephone Encounter (Signed)
°*  STAT* If patient is at the pharmacy, call can be transferred to refill team.   1. Which medications need to be refilled? (please list name of each medication and dose if known)     xarelto 20 mg po q day    Diltiazem 180mg  po q day   2. Which pharmacy/location (including street and city if local pharmacy) is medication to be sent to? [facility] on [address]   3. Do they need a 30 day or 90 day supply? 30

## 2017-06-10 NOTE — Telephone Encounter (Signed)
Please review for refill. Thanks!  

## 2017-06-13 MED ORDER — RIVAROXABAN 20 MG PO TABS: ORAL_TABLET | ORAL | 3 refills | Status: DC

## 2017-06-24 ENCOUNTER — Other Ambulatory Visit: Payer: Self-pay | Admitting: Cardiovascular Disease

## 2017-07-07 ENCOUNTER — Ambulatory Visit: Payer: 59 | Admitting: Cardiovascular Disease

## 2017-07-25 ENCOUNTER — Other Ambulatory Visit: Payer: Self-pay | Admitting: Cardiovascular Disease

## 2017-08-04 ENCOUNTER — Ambulatory Visit: Payer: 59 | Admitting: Nurse Practitioner

## 2017-08-24 ENCOUNTER — Other Ambulatory Visit: Payer: Self-pay | Admitting: Cardiovascular Disease

## 2017-08-26 ENCOUNTER — Ambulatory Visit (INDEPENDENT_AMBULATORY_CARE_PROVIDER_SITE_OTHER): Payer: Medicare Other | Admitting: Cardiovascular Disease

## 2017-08-26 ENCOUNTER — Encounter: Payer: Self-pay | Admitting: Cardiovascular Disease

## 2017-08-26 VITALS — BP 130/90 | HR 148 | Ht 63.0 in | Wt 248.5 lb

## 2017-08-26 DIAGNOSIS — I4891 Unspecified atrial fibrillation: Secondary | ICD-10-CM | POA: Diagnosis not present

## 2017-08-26 DIAGNOSIS — I5032 Chronic diastolic (congestive) heart failure: Secondary | ICD-10-CM | POA: Diagnosis not present

## 2017-08-26 MED ORDER — POTASSIUM CHLORIDE CRYS ER 20 MEQ PO TBCR: 20.0000 meq | EXTENDED_RELEASE_TABLET | Freq: Every day | ORAL | 3 refills | Status: DC

## 2017-08-26 NOTE — Progress Notes (Signed)
Cardiology Office Note   Date:  08/26/2017   ID:  Stacey [name], DOB May 01, 1950, MRN [id number]  PCP:  Marguarite Arbour, MD  Cardiologist:   [name], MD   Chief Complaint  Patient presents with  . OTHER    6 month f/u c/o no complaints today. Meds reviewed verbally with pt.      History of Present Illness: Stacey [name] is a 67 y.o. female who presents for a follow-up visit regarding persistent atrial fibrillation and chronic diastolic heart failure. She has known history of  HTN and depression.   Echocardiogram in 2015 showed nl LV fxn and a mildly dilated LA with no significant LVH.   Cardiac cath in 2015 showed mild nonobstructive coronary artery disease. She underwent multiple cardioversions but she has been maintaining in sinus rhythm on flecainide She uses a CPAP regularly for sleep apnea. She reports being tachycardic when she saw Dr. Judithann Sheen in March and the dose of diltiazem was increased to 240 mg once daily.  She is noted to be in atrial fibrillation today with heart rate of 148 bpm.  She denies any palpitations or chest pain.  She does report exertional dyspnea with no orthopnea, PND or leg edema. She had recent labs done 2 days ago which overall were unremarkable except for mild hypokalemia at 3.3.   Past Medical History:  Diagnosis Date  . Chronic diastolic CHF (congestive heart failure) (HCC)    a. Dx 08/2013 in setting of rapid afib;  b. 08/2013 Echo: EF 55-60%, mildly dil LA, nl RV.  Marland Kitchen Depression   . GERD (gastroesophageal reflux disease)   . Hypertension   . Non-obstructive CAD    a. 10/2013 Myoview: anteroseptal, apical, septal mild ischemia, nl LV fxn;  b. Cath: LAD 82m LCX 20ost, RCA 10m.  . Obesity   . OSA (obstructive sleep apnea)    a. on CPAP  . Paroxysmal atrial fibrillation (HCC)    a. Dx 08/2013. CHA2DS2VASc = 3 (diast chf, htn, female) -->Xarelto initiated; b. 09/2013 s/p DCCV;  c. 10/2013 recurrent afib->Flecainide added->S/P DCCV;  d.  11/2013 Recurrent afib while off of flecainide (2/2 abnl nuc study)-->Flecainide resumed after nl cath-->DCCV;  e. Recurrent Afib 01/2014-->Flecainide increased to 150mg  BID.    Past Surgical History:  Procedure Laterality Date  . BREAST BIOPSY Right 2007   stereo-neg  . CARDIAC CATHETERIZATION  10/2013   [facility]  . ECTOPIC PREGNANCY SURGERY    . TONSILLECTOMY       Current Outpatient Medications  Medication Sig Dispense Refill  . diltiazem (DILACOR XR) 240 MG 24 hr capsule Take 240 mg by mouth daily.    Marland Kitchen esomeprazole (NEXIUM) 40 MG capsule TAKE 1 CAPSULE BY MOUTH EVERY MORNING 30 capsule 0  . flecainide (TAMBOCOR) 100 MG tablet take 1 tablet by mouth twice a day 60 tablet 3  . furosemide (LASIX) 40 MG tablet TAKE 1 TABLET BY MOUTH ONCE DAILY 30 tablet 0  . HYDROcodone-acetaminophen (NORCO) 10-325 MG tablet Take 10-325 tablets by mouth every 6 (six) hours as needed.  0  . potassium chloride (K-DUR) 10 MEQ tablet Take 1 tablet (10 mEq total) by mouth daily. 90 tablet 3  . rivaroxaban (XARELTO) 20 MG TABS tablet take 1 tablet by mouth once daily WITH SUPPER 30 tablet 3  . venlafaxine (EFFEXOR) 75 MG tablet Take 75 mg by mouth 2 (two) times daily.    Marland Kitchen zolpidem (AMBIEN) 10 MG tablet Take 10 mg by mouth at bedtime  as needed for sleep.      No current facility-administered medications for this visit.     Allergies:   Levaquin [levofloxacin in d5w] and Metoprolol    Social History:  The patient  reports that she has never smoked. She has never used smokeless tobacco. She reports that she drinks alcohol. She reports that she does not use drugs.   Family History:  The patient's family history includes Breast cancer (age of onset: 107) in her mother; CAD in her father; Heart attack in her father; Hypertension in her mother.    ROS:  Please see the history of present illness.   Otherwise, review of systems are positive for none.   All other systems are reviewed and negative.    PHYSICAL  EXAM: VS:  BP 130/90 (BP Location: Left Arm, Patient Position: Sitting, Cuff Size: Large)   Pulse (!) 148   Ht 5\' 3"  (1.6 m)   Wt 248 lb 8 oz (112.7 kg)   BMI 44.02 kg/m  , BMI Body mass index is 44.02 kg/m. GEN: Well nourished, well developed, in no acute distress  HEENT: normal  Neck: no JVD, carotid bruits, or masses Cardiac: Irregularly irregular and mildly tachycardic; no  rubs, or gallops,no edema . 1/6 systolic ejection murmur in the aortic area Respiratory:  clear to auscultation bilaterally, normal work of breathing GI: soft, nontender, nondistended, + BS MS: no deformity or atrophy  Skin: warm and dry, no rash Neuro:  Strength and sensation are intact Psych: euthymic mood, full affect   EKG:  EKG is ordered today. The ekg ordered today demonstrates atrial fibrillation with rapid ventricular response with a heart rate of 148 bpm.   Recent Labs: No results found for requested labs within last 8760 hours.    Lipid Panel    Component Value Date/Time   CHOL 194 08/28/2013 0352   TRIG 122 08/28/2013 0352   HDL 56 08/28/2013 0352   VLDL 24 08/28/2013 0352   LDLCALC 114 (H) 08/28/2013 0352      Wt Readings from Last 3 Encounters:  08/26/17 248 lb 8 oz (112.7 kg)  09/10/16 243 lb (110.2 kg)  12/26/15 250 lb (113.4 kg)         ASSESSMENT AND PLAN:  1.  Persistent atrial fibrillation: She is noted to be in atrial fibrillation with rapid ventricular response today.  She is significantly tachycardic but fortunately not very symptomatic from this other than mild dyspnea. She has not missed any of her medications including Xarelto, flecainide and diltiazem.  Given significant tachycardia, I recommend proceeding with cardioversion on Monday. I reviewed her labs done from 2 days ago which overall were unremarkable except for mild hypokalemia. If the patient starts having recurrent episodes of atrial fibrillation, I will consider referral to the A. fib clinic to consider  ablation. I am going to obtain an echocardiogram after cardioversion.  2. Chronic diastolic heart failure: She appears to be euvolemic on current dose of furosemide.  I increased potassium chloride to 20 mEq once daily given hypokalemia.  3. Obstructive sleep apnea: Doing well with CPAP.   Disposition:   FU with me in 1 months  Signed,  [name], MD  08/26/2017 3:12 PM    [facility]

## 2017-08-26 NOTE — H&P (View-Only) (Signed)
Cardiology Office Note   Date:  08/26/2017   ID:  Stacey [name], DOB May 01, 1950, MRN [id number]  PCP:  Marguarite Arbour, MD  Cardiologist:   [name], MD   Chief Complaint  Patient presents with  . OTHER    6 month f/u c/o no complaints today. Meds reviewed verbally with pt.      History of Present Illness: Stacey [name] is a 67 y.o. female who presents for a follow-up visit regarding persistent atrial fibrillation and chronic diastolic heart failure. She has known history of  HTN and depression.   Echocardiogram in 2015 showed nl LV fxn and a mildly dilated LA with no significant LVH.   Cardiac cath in 2015 showed mild nonobstructive coronary artery disease. She underwent multiple cardioversions but she has been maintaining in sinus rhythm on flecainide She uses a CPAP regularly for sleep apnea. She reports being tachycardic when she saw Dr. Judithann Sheen in March and the dose of diltiazem was increased to 240 mg once daily.  She is noted to be in atrial fibrillation today with heart rate of 148 bpm.  She denies any palpitations or chest pain.  She does report exertional dyspnea with no orthopnea, PND or leg edema. She had recent labs done 2 days ago which overall were unremarkable except for mild hypokalemia at 3.3.   Past Medical History:  Diagnosis Date  . Chronic diastolic CHF (congestive heart failure) (HCC)    a. Dx 08/2013 in setting of rapid afib;  b. 08/2013 Echo: EF 55-60%, mildly dil LA, nl RV.  Marland Kitchen Depression   . GERD (gastroesophageal reflux disease)   . Hypertension   . Non-obstructive CAD    a. 10/2013 Myoview: anteroseptal, apical, septal mild ischemia, nl LV fxn;  b. Cath: LAD 82m LCX 20ost, RCA 10m.  . Obesity   . OSA (obstructive sleep apnea)    a. on CPAP  . Paroxysmal atrial fibrillation (HCC)    a. Dx 08/2013. CHA2DS2VASc = 3 (diast chf, htn, female) -->Xarelto initiated; b. 09/2013 s/p DCCV;  c. 10/2013 recurrent afib->Flecainide added->S/P DCCV;  d.  11/2013 Recurrent afib while off of flecainide (2/2 abnl nuc study)-->Flecainide resumed after nl cath-->DCCV;  e. Recurrent Afib 01/2014-->Flecainide increased to 150mg  BID.    Past Surgical History:  Procedure Laterality Date  . BREAST BIOPSY Right 2007   stereo-neg  . CARDIAC CATHETERIZATION  10/2013   [facility]  . ECTOPIC PREGNANCY SURGERY    . TONSILLECTOMY       Current Outpatient Medications  Medication Sig Dispense Refill  . diltiazem (DILACOR XR) 240 MG 24 hr capsule Take 240 mg by mouth daily.    Marland Kitchen esomeprazole (NEXIUM) 40 MG capsule TAKE 1 CAPSULE BY MOUTH EVERY MORNING 30 capsule 0  . flecainide (TAMBOCOR) 100 MG tablet take 1 tablet by mouth twice a day 60 tablet 3  . furosemide (LASIX) 40 MG tablet TAKE 1 TABLET BY MOUTH ONCE DAILY 30 tablet 0  . HYDROcodone-acetaminophen (NORCO) 10-325 MG tablet Take 10-325 tablets by mouth every 6 (six) hours as needed.  0  . potassium chloride (K-DUR) 10 MEQ tablet Take 1 tablet (10 mEq total) by mouth daily. 90 tablet 3  . rivaroxaban (XARELTO) 20 MG TABS tablet take 1 tablet by mouth once daily WITH SUPPER 30 tablet 3  . venlafaxine (EFFEXOR) 75 MG tablet Take 75 mg by mouth 2 (two) times daily.    Marland Kitchen zolpidem (AMBIEN) 10 MG tablet Take 10 mg by mouth at bedtime  as needed for sleep.      No current facility-administered medications for this visit.     Allergies:   Levaquin [levofloxacin in d5w] and Metoprolol    Social History:  The patient  reports that she has never smoked. She has never used smokeless tobacco. She reports that she drinks alcohol. She reports that she does not use drugs.   Family History:  The patient's family history includes Breast cancer (age of onset: 107) in her mother; CAD in her father; Heart attack in her father; Hypertension in her mother.    ROS:  Please see the history of present illness.   Otherwise, review of systems are positive for none.   All other systems are reviewed and negative.    PHYSICAL  EXAM: VS:  BP 130/90 (BP Location: Left Arm, Patient Position: Sitting, Cuff Size: Large)   Pulse (!) 148   Ht 5\' 3"  (1.6 m)   Wt 248 lb 8 oz (112.7 kg)   BMI 44.02 kg/m  , BMI Body mass index is 44.02 kg/m. GEN: Well nourished, well developed, in no acute distress  HEENT: normal  Neck: no JVD, carotid bruits, or masses Cardiac: Irregularly irregular and mildly tachycardic; no  rubs, or gallops,no edema . 1/6 systolic ejection murmur in the aortic area Respiratory:  clear to auscultation bilaterally, normal work of breathing GI: soft, nontender, nondistended, + BS MS: no deformity or atrophy  Skin: warm and dry, no rash Neuro:  Strength and sensation are intact Psych: euthymic mood, full affect   EKG:  EKG is ordered today. The ekg ordered today demonstrates atrial fibrillation with rapid ventricular response with a heart rate of 148 bpm.   Recent Labs: No results found for requested labs within last 8760 hours.    Lipid Panel    Component Value Date/Time   CHOL 194 08/28/2013 0352   TRIG 122 08/28/2013 0352   HDL 56 08/28/2013 0352   VLDL 24 08/28/2013 0352   LDLCALC 114 (H) 08/28/2013 0352      Wt Readings from Last 3 Encounters:  08/26/17 248 lb 8 oz (112.7 kg)  09/10/16 243 lb (110.2 kg)  12/26/15 250 lb (113.4 kg)         ASSESSMENT AND PLAN:  1.  Persistent atrial fibrillation: She is noted to be in atrial fibrillation with rapid ventricular response today.  She is significantly tachycardic but fortunately not very symptomatic from this other than mild dyspnea. She has not missed any of her medications including Xarelto, flecainide and diltiazem.  Given significant tachycardia, I recommend proceeding with cardioversion on Monday. I reviewed her labs done from 2 days ago which overall were unremarkable except for mild hypokalemia. If the patient starts having recurrent episodes of atrial fibrillation, I will consider referral to the A. fib clinic to consider  ablation. I am going to obtain an echocardiogram after cardioversion.  2. Chronic diastolic heart failure: She appears to be euvolemic on current dose of furosemide.  I increased potassium chloride to 20 mEq once daily given hypokalemia.  3. Obstructive sleep apnea: Doing well with CPAP.   Disposition:   FU with me in 1 months  Signed,  [name], MD  08/26/2017 3:12 PM    [facility]

## 2017-08-26 NOTE — Patient Instructions (Addendum)
Medication Instructions:  Your physician has recommended you make the following change in your medication:  1- INCREASE Potassium to 20 mEq by mouth once a day.   Labwork: none  Testing/Procedures: Your physician has requested that you have an echocardiogram. Echocardiography is a painless test that uses sound waves to create images of your heart. It provides your doctor with information about the size and shape of your heart and how well your heart's chambers and valves are working. This procedure takes approximately one hour. There are no restrictions for this procedure. You may receive an IV if needed to receive an ultrasound enhancing agent to better visualize your heart.     You are scheduled for a Cardioversion on _05/06/2019_ with Dr._ARIDA___ Please arrive at the [facility] at _06:30__ a.m. on the day of your procedure.  DIET INSTRUCTIONS:  Nothing to eat or drink after midnight except your medications with a              sip of water.         1) Labs: ___NONE_____  2) Medications:  YOU MAY TAKE ALL of your remaining medications with a small amount of water.  3) Must have a responsible person to drive you home.  4) Bring a current list of your medications and current insurance cards.    If you have any questions after you get home, please call the office at [phone]   Follow-Up: Your physician recommends that you schedule a follow-up appointment in: 1 MONTH WITH DR ARIDA.    If you need a refill on your cardiac medications before your next appointment, please call your pharmacy.    Electrical Cardioversion Electrical cardioversion is the delivery of a jolt of electricity to restore a normal rhythm to the heart. A rhythm that is too fast or is not regular keeps the heart from pumping well. In this procedure, sticky patches or metal paddles are placed on the chest to deliver electricity to the heart from a device. This procedure may be done in an emergency  if:  There is low or no blood pressure as a result of the heart rhythm.  Normal rhythm must be restored as fast as possible to protect the brain and heart from further damage.  It may save a life.  This procedure may also be done for irregular or fast heart rhythms that are not immediately life-threatening. Tell a health care provider about:  Any allergies you have.  All medicines you are taking, including vitamins, herbs, eye drops, creams, and over-the-counter medicines.  Any problems you or family members have had with anesthetic medicines.  Any blood disorders you have.  Any surgeries you have had.  Any medical conditions you have.  Whether you are pregnant or may be pregnant. What are the risks? Generally, this is a safe procedure. However, problems may occur, including:  Allergic reactions to medicines.  A blood clot that breaks free and travels to other parts of your body.  The possible return of an abnormal heart rhythm within hours or days after the procedure.  Your heart stopping (cardiac arrest). This is rare.  What happens before the procedure? Medicines  Your health care provider may have you start taking: ? Blood-thinning medicines (anticoagulants) so your blood does not clot as easily. ? Medicines may be given to help stabilize your heart rate and rhythm.  Ask your health care provider about changing or stopping your regular medicines. This is especially important if you  are taking diabetes medicines or blood thinners. General instructions  Plan to have someone take you home from the hospital or clinic.  If you will be going home right after the procedure, plan to have someone with you for 24 hours.  Follow instructions from your health care provider about eating or drinking restrictions. What happens during the procedure?  To lower your risk of infection: ? Your health care team will wash or sanitize their hands. ? Your skin will be washed with  soap.  An IV tube will be inserted into one of your veins.  You will be given a medicine to help you relax (sedative).  Sticky patches (electrodes) or metal paddles may be placed on your chest.  An electrical shock will be delivered. The procedure may vary among health care providers and hospitals. What happens after the procedure?  Your blood pressure, heart rate, breathing rate, and blood oxygen level will be monitored until the medicines you were given have worn off.  Do not drive for 24 hours if you were given a sedative.  Your heart rhythm will be watched to make sure it does not change. This information is not intended to replace advice given to you by your health care provider. Make sure you discuss any questions you have with your health care provider. Document Released: 04/02/2002 Document Revised: 12/10/2015 Document Reviewed: 10/17/2015 Elsevier Interactive Patient Education  2017 [employer].

## 2017-08-29 ENCOUNTER — Ambulatory Visit: Payer: Medicare Other | Admitting: Certified Registered Nurse Anesthetist

## 2017-08-29 ENCOUNTER — Ambulatory Visit
Admission: RE | Admit: 2017-08-29 | Discharge: 2017-08-29 | Disposition: A | Discharge status: Home / Self Care | Payer: Medicare Other | Source: Ambulatory Visit | Attending: Cardiovascular Disease | Admitting: Cardiovascular Disease

## 2017-08-29 ENCOUNTER — Encounter
Admission: RE | Disposition: A | Discharge status: Home / Self Care | Payer: Self-pay | Source: Ambulatory Visit | Attending: Cardiovascular Disease

## 2017-08-29 ENCOUNTER — Encounter: Payer: Self-pay | Admitting: Emergency Medicine

## 2017-08-29 DIAGNOSIS — Z7902 Long term (current) use of antithrombotics/antiplatelets: Secondary | ICD-10-CM | POA: Diagnosis not present

## 2017-08-29 DIAGNOSIS — I481 Persistent atrial fibrillation: Secondary | ICD-10-CM | POA: Insufficient documentation

## 2017-08-29 DIAGNOSIS — G4733 Obstructive sleep apnea (adult) (pediatric): Secondary | ICD-10-CM | POA: Diagnosis not present

## 2017-08-29 DIAGNOSIS — I5032 Chronic diastolic (congestive) heart failure: Secondary | ICD-10-CM | POA: Insufficient documentation

## 2017-08-29 DIAGNOSIS — I110 Hypertensive heart disease with heart failure: Secondary | ICD-10-CM | POA: Insufficient documentation

## 2017-08-29 DIAGNOSIS — K219 Gastro-esophageal reflux disease without esophagitis: Secondary | ICD-10-CM | POA: Diagnosis not present

## 2017-08-29 DIAGNOSIS — F329 Major depressive disorder, single episode, unspecified: Secondary | ICD-10-CM | POA: Diagnosis not present

## 2017-08-29 DIAGNOSIS — I2510 Atherosclerotic heart disease of native coronary artery without angina pectoris: Secondary | ICD-10-CM | POA: Diagnosis not present

## 2017-08-29 DIAGNOSIS — Z79899 Other long term (current) drug therapy: Secondary | ICD-10-CM | POA: Diagnosis not present

## 2017-08-29 HISTORY — PX: CARDIOVERSION: EP1203

## 2017-08-29 SURGERY — CARDIOVERSION (CATH LAB)
Anesthesia: General

## 2017-08-29 MED ORDER — PROPOFOL 10 MG/ML IV BOLUS
INTRAVENOUS | Status: DC | PRN
  Administered 2017-08-29 (×2): 20 mg via INTRAVENOUS
  Administered 2017-08-29: 60 mg via INTRAVENOUS

## 2017-08-29 NOTE — Anesthesia Post-op Follow-up Note (Signed)
Anesthesia QCDR form completed.        

## 2017-08-29 NOTE — Transfer of Care (Signed)
Immediate Anesthesia Transfer of Care Note  Patient: Stacey [name]  Procedure(s) Performed: CARDIOVERSION (N/A )  Patient Location: PACU  Anesthesia Type:General  Level of Consciousness: awake, alert  and oriented  Airway & Oxygen Therapy: Patient Spontanous Breathing and Patient connected to nasal cannula oxygen  Post-op Assessment: Report given to RN and Post -op Vital signs reviewed and stable  Post vital signs: Reviewed and stable  Last Vitals:  Vitals Value Taken Time  BP 140/87 08/29/2017  7:46 AM  Temp    Pulse 83 08/29/2017  7:47 AM  Resp 21 08/29/2017  7:47 AM  SpO2 94 % 08/29/2017  7:47 AM  Vitals shown include unvalidated device data.  Last Pain:  Vitals:   08/29/17 0658  TempSrc: Oral  PainSc: 0-No pain         Complications: No apparent anesthesia complications

## 2017-08-29 NOTE — Interval H&P Note (Signed)
History and Physical Interval Note:  08/29/2017 7:49 AM  Stacey [name]  has presented today for surgery, with the diagnosis of Cardioversion  Afib  The various methods of treatment have been discussed with the patient and family. After consideration of risks, benefits and other options for treatment, the patient has consented to  Procedure(s): CARDIOVERSION (N/A) as a surgical intervention .  The patient's history has been reviewed, patient examined, no change in status, stable for surgery.  I have reviewed the patient's chart and labs.  Questions were answered to the patient's satisfaction.     [name]

## 2017-08-29 NOTE — CV Procedure (Signed)
Cardioversion note: A standard informed consent was obtained. Timeout was performed. The pads were placed in the anterior posterior fashion. The patient was given propofol by the anesthesia team.  The patient did not convert after 2 shocks of 200 J but did convert after the third shock.  Pre-and post EKGs were reviewed. The patient tolerated the procedure with no immediate complications.  Recommendations: Continue same medications and follow-up in 2-3 weeks.

## 2017-08-29 NOTE — Anesthesia Postprocedure Evaluation (Signed)
Anesthesia Post Note  Patient: [name]  Procedure(s) Performed: CARDIOVERSION (N/A )  Patient location during evaluation: PACU Anesthesia Type: General Level of consciousness: awake and alert Pain management: pain level controlled Vital Signs Assessment: post-procedure vital signs reviewed and stable Respiratory status: spontaneous breathing, nonlabored ventilation, respiratory function stable and patient connected to nasal cannula oxygen Cardiovascular status: blood pressure returned to baseline and stable Postop Assessment: no apparent nausea or vomiting Anesthetic complications: no     Last Vitals:  Vitals:   08/29/17 0800 08/29/17 0815  BP: 117/74 118/63  Pulse: 84 83  Resp: (!) 22 (!) 22  Temp:    SpO2: 96% 93%    Last Pain:  Vitals:   08/29/17 0750  TempSrc:   PainSc: 0-No pain                 Yevette Edwards

## 2017-08-29 NOTE — Anesthesia Preprocedure Evaluation (Signed)
Anesthesia Evaluation  Patient identified by MRN, date of birth, ID band Patient awake    Reviewed: Allergy & Precautions, H&P , NPO status , Patient's Chart, lab work & pertinent test results, reviewed documented beta blocker date and time   Airway Mallampati: II   Neck ROM: full    Dental  (+) Poor Dentition   Pulmonary neg pulmonary ROS, sleep apnea ,    Pulmonary exam normal        Cardiovascular Exercise Tolerance: Poor hypertension, On Medications + CAD and +CHF  negative cardio ROS Normal cardiovascular exam Rhythm:regular Rate:Normal     Neuro/Psych PSYCHIATRIC DISORDERS Depression negative neurological ROS  negative psych ROS   GI/Hepatic negative GI ROS, Neg liver ROS, GERD  ,  Endo/Other  negative endocrine ROS  Renal/GU negative Renal ROS  negative genitourinary   Musculoskeletal   Abdominal   Peds  Hematology negative hematology ROS (+)   Anesthesia Other Findings Past Medical History: No date: Chronic diastolic CHF (congestive heart failure) (HCC)     Comment:  a. Dx 08/2013 in setting of rapid afib;  b. 08/2013 Echo:               EF 55-60%, mildly dil LA, nl RV. No date: Depression No date: GERD (gastroesophageal reflux disease) No date: Hypertension No date: Non-obstructive CAD     Comment:  a. 10/2013 Myoview: anteroseptal, apical, septal mild               ischemia, nl LV fxn;  b. Cath: LAD 68m LCX 20ost, RCA               71m. No date: Obesity No date: OSA (obstructive sleep apnea)     Comment:  a. on CPAP No date: Paroxysmal atrial fibrillation (HCC)     Comment:  a. Dx 08/2013. CHA2DS2VASc = 3 (diast chf, htn, female)               -->Xarelto initiated; b. 09/2013 s/p DCCV;  c. 10/2013               recurrent afib->Flecainide added->S/P DCCV;  d. 11/2013               Recurrent afib while off of flecainide (2/2 abnl nuc               study)-->Flecainide resumed after nl cath-->DCCV;  e.              Recurrent Afib 01/2014-->Flecainide increased to 150mg                BID. Past Surgical History: 2007: BREAST BIOPSY; Right     Comment:  stereo-neg 10/2013: CARDIAC CATHETERIZATION     Comment:  [facility] No date: ECTOPIC PREGNANCY SURGERY No date: TONSILLECTOMY BMI    Body Mass Index:  43.05 kg/m     Reproductive/Obstetrics negative OB ROS                             Anesthesia Physical Anesthesia Plan  ASA: IV  Anesthesia Plan: General   Post-op Pain Management:    Induction:   PONV Risk Score and Plan:   Airway Management Planned:   Additional Equipment:   Intra-op Plan:   Post-operative Plan:   Informed Consent: I have reviewed the patients History and Physical, chart, labs and discussed the procedure including the risks, benefits and alternatives for the proposed anesthesia with the patient or authorized  representative who has indicated his/her understanding and acceptance.   Dental Advisory Given  Plan Discussed with: CRNA  Anesthesia Plan Comments:         Anesthesia Quick Evaluation

## 2017-08-29 NOTE — Anesthesia Procedure Notes (Signed)
Performed by: Mikayla Chiusano, CRNA Pre-anesthesia Checklist: Patient identified, Emergency Drugs available, Suction available, Timeout performed and Patient being monitored Patient Re-evaluated:Patient Re-evaluated prior to induction Oxygen Delivery Method: Nasal cannula Induction Type: IV induction       

## 2017-08-29 NOTE — Discharge Instructions (Signed)
Electrical Cardioversion, Care After °This sheet gives you information about how to care for yourself after your procedure. Your health care provider may also give you more specific instructions. If you have problems or questions, contact your health care provider. °What can I expect after the procedure? °After the procedure, it is common to have: °· Some redness on the skin where the shocks were given. ° °Follow these instructions at home: °· Do not drive for 24 hours if you were given a medicine to help you relax (sedative). °· Take over-the-counter and prescription medicines only as told by your health care provider. °· Ask your health care provider how to check your pulse. Check it often. °· Rest for 48 hours after the procedure or as told by your health care provider. °· Avoid or limit your caffeine use as told by your health care provider. °Contact a health care provider if: °· You feel like your heart is beating too quickly or your pulse is not regular. °· You have a serious muscle cramp that does not go away. °Get help right away if: °· You have discomfort in your chest. °· You are dizzy or you feel faint. °· You have trouble breathing or you are short of breath. °· Your speech is slurred. °· You have trouble moving an arm or leg on one side of your body. °· Your fingers or toes turn cold or blue. °This information is not intended to replace advice given to you by your health care provider. Make sure you discuss any questions you have with your health care provider. °Document Released: 01/31/2013 Document Revised: 11/14/2015 Document Reviewed: 10/17/2015 °Elsevier Interactive Patient Education © 2018 [employer] °General Anesthesia, Adult, Care After °These instructions provide you with information about caring for yourself after your procedure. Your health care provider may also give you more specific instructions. Your treatment has been planned according to current medical practices, but problems  sometimes occur. Call your health care provider if you have any problems or questions after your procedure. °What can I expect after the procedure? °After the procedure, it is common to have: °· Vomiting. °· A sore throat. °· Mental slowness. ° °It is common to feel: °· Nauseous. °· Cold or shivery. °· Sleepy. °· Tired. °· Sore or achy, even in parts of your body where you did not have surgery. ° °Follow these instructions at home: °For at least 24 hours after the procedure: °· Do not: °? Participate in activities where you could fall or become injured. °? Drive. °? Use heavy machinery. °? Drink alcohol. °? Take sleeping pills or medicines that cause drowsiness. °? Make important decisions or sign legal documents. °? Take care of children on your own. °· Rest. °Eating and drinking °· If you vomit, drink water, juice, or soup when you can drink without vomiting. °· Drink enough fluid to keep your urine clear or pale yellow. °· Make sure you have little or no nausea before eating solid foods. °· Follow the diet recommended by your health care provider. °General instructions °· Have a responsible adult stay with you until you are awake and alert. °· Return to your normal activities as told by your health care provider. Ask your health care provider what activities are safe for you. °· Take over-the-counter and prescription medicines only as told by your health care provider. °· If you smoke, do not smoke without supervision. °· Keep all follow-up visits as told by your health care provider. This is important. °Contact a   health care provider if: °· You continue to have nausea or vomiting at home, and medicines are not helpful. °· You cannot drink fluids or start eating again. °· You cannot urinate after 8-12 hours. °· You develop a skin rash. °· You have fever. °· You have increasing redness at the site of your procedure. °Get help right away if: °· You have difficulty breathing. °· You have chest pain. °· You have  unexpected bleeding. °· You feel that you are having a life-threatening or urgent problem. °This information is not intended to replace advice given to you by your health care provider. Make sure you discuss any questions you have with your health care provider. °Document Released: 07/19/2000 Document Revised: 09/15/2015 Document Reviewed: 03/27/2015 °Elsevier Interactive Patient Education © 2018 [employer] ° °

## 2017-09-13 ENCOUNTER — Other Ambulatory Visit: Payer: Self-pay

## 2017-09-13 ENCOUNTER — Ambulatory Visit (INDEPENDENT_AMBULATORY_CARE_PROVIDER_SITE_OTHER): Payer: Medicare Other

## 2017-09-13 DIAGNOSIS — I4891 Unspecified atrial fibrillation: Secondary | ICD-10-CM | POA: Diagnosis not present

## 2017-09-14 ENCOUNTER — Ambulatory Visit (HOSPITAL_COMMUNITY)
Admission: RE | Admit: 2017-09-14 | Discharge: 2017-09-14 | Disposition: A | Discharge status: Home / Self Care | Payer: Medicare Other | Source: Ambulatory Visit | Attending: Nurse Practitioner | Admitting: Nurse Practitioner

## 2017-09-14 ENCOUNTER — Ambulatory Visit (HOSPITAL_COMMUNITY): Payer: Medicare Other | Admitting: Nurse Practitioner

## 2017-09-14 ENCOUNTER — Telehealth: Payer: Self-pay | Admitting: Pharmacist

## 2017-09-14 ENCOUNTER — Encounter (HOSPITAL_COMMUNITY): Payer: Self-pay | Admitting: Nurse Practitioner

## 2017-09-14 VITALS — BP 136/74 | HR 119 | Ht 63.0 in | Wt 239.2 lb

## 2017-09-14 DIAGNOSIS — Z888 Allergy status to other drugs, medicaments and biological substances status: Secondary | ICD-10-CM | POA: Diagnosis not present

## 2017-09-14 DIAGNOSIS — Z7901 Long term (current) use of anticoagulants: Secondary | ICD-10-CM | POA: Diagnosis not present

## 2017-09-14 DIAGNOSIS — E669 Obesity, unspecified: Secondary | ICD-10-CM | POA: Insufficient documentation

## 2017-09-14 DIAGNOSIS — I4891 Unspecified atrial fibrillation: Secondary | ICD-10-CM

## 2017-09-14 DIAGNOSIS — Z8249 Family history of ischemic heart disease and other diseases of the circulatory system: Secondary | ICD-10-CM | POA: Diagnosis not present

## 2017-09-14 DIAGNOSIS — K219 Gastro-esophageal reflux disease without esophagitis: Secondary | ICD-10-CM | POA: Insufficient documentation

## 2017-09-14 DIAGNOSIS — Z6841 Body Mass Index (BMI) 40.0 and over, adult: Secondary | ICD-10-CM | POA: Diagnosis not present

## 2017-09-14 DIAGNOSIS — Z881 Allergy status to other antibiotic agents status: Secondary | ICD-10-CM | POA: Insufficient documentation

## 2017-09-14 DIAGNOSIS — I480 Paroxysmal atrial fibrillation: Secondary | ICD-10-CM | POA: Diagnosis present

## 2017-09-14 DIAGNOSIS — Z803 Family history of malignant neoplasm of breast: Secondary | ICD-10-CM | POA: Diagnosis not present

## 2017-09-14 DIAGNOSIS — Z79899 Other long term (current) drug therapy: Secondary | ICD-10-CM | POA: Diagnosis not present

## 2017-09-14 DIAGNOSIS — I2510 Atherosclerotic heart disease of native coronary artery without angina pectoris: Secondary | ICD-10-CM | POA: Diagnosis not present

## 2017-09-14 DIAGNOSIS — I110 Hypertensive heart disease with heart failure: Secondary | ICD-10-CM | POA: Insufficient documentation

## 2017-09-14 DIAGNOSIS — F329 Major depressive disorder, single episode, unspecified: Secondary | ICD-10-CM | POA: Diagnosis not present

## 2017-09-14 DIAGNOSIS — I5032 Chronic diastolic (congestive) heart failure: Secondary | ICD-10-CM | POA: Insufficient documentation

## 2017-09-14 DIAGNOSIS — G4733 Obstructive sleep apnea (adult) (pediatric): Secondary | ICD-10-CM | POA: Diagnosis not present

## 2017-09-14 LAB — MAGNESIUM: Magnesium: 1.7 mg/dL (ref 1.7–2.4)

## 2017-09-14 MED ORDER — DILTIAZEM HCL ER COATED BEADS 120 MG PO CP24: 120.0000 mg | ORAL_CAPSULE | Freq: Every day | ORAL | 3 refills | Status: DC

## 2017-09-14 NOTE — Patient Instructions (Addendum)
Your physician has recommended you make the following change in your medication:  1)Stop Flecainide  2)Add cardizem 120mg  at bedtime. (this is in addition to your 240mg  dose in the morning)  Call Saintclair Schroader once you have checked on the price of the drug for tikosyn (dofetilide)

## 2017-09-14 NOTE — Telephone Encounter (Signed)
Medication list reviewed in anticipation of upcoming Tikosyn initiation. Patient is not taking any contraindicated or QTc prolonging medications.   Patient is anticoagulated on Xarelto 20mg daily on the appropriate dose. Please ensure that patient has not missed any anticoagulation doses in the 3 weeks prior to Tikosyn initiation.   Patient will need to be counseled to avoid use of Benadryl while on Tikosyn and in the 2-3 days prior to Tikosyn initiation.  

## 2017-09-15 NOTE — Progress Notes (Signed)
Primary Care Physician: Marguarite Arbour, MD Referring Physician: Dr. Glynn Octave EP: Dr. Marinell Blight [name] is a 67 y.o. female with a h/o chronic diastolic HF, obesity, HTN, OSA on cpap, and paroxysmal afib in the clinic for evaluation. She has been on flecainde for years and afib burden has been low. She recently went into afib and had a successful cardioversion but with ERAF. Recent echo showed EF of 20-25%, but pt was in afib at time of echo at 130 bpm. Left atrium mildly dilated. Pt was seen by Dr. Johney Frame several years ago and offered Tikosyn or ablation but her afib burden trended down. Her qtc runs around 470 ms , which is on the  long side with tikosyn, prefer at baseline, no longer than 440 ms.  Today, she denies symptoms of palpitations, chest pain, shortness of breath, orthopnea, PND, lower extremity edema, dizziness, presyncope, syncope, or neurologic sequela. The patient is tolerating medications without difficulties and is otherwise without complaint today.   Past Medical History:  Diagnosis Date  . Chronic diastolic CHF (congestive heart failure) (HCC)    a. Dx 08/2013 in setting of rapid afib;  b. 08/2013 Echo: EF 55-60%, mildly dil LA, nl RV.  Stacey [name] Depression   . GERD (gastroesophageal reflux disease)   . Hypertension   . Non-obstructive CAD    a. 10/2013 Myoview: anteroseptal, apical, septal mild ischemia, nl LV fxn;  b. Cath: LAD 59m LCX 20ost, RCA 71m.  . Obesity   . OSA (obstructive sleep apnea)    a. on CPAP  . Paroxysmal atrial fibrillation (HCC)    a. Dx 08/2013. CHA2DS2VASc = 3 (diast chf, htn, female) -->Xarelto initiated; b. 09/2013 s/p DCCV;  c. 10/2013 recurrent afib->Flecainide added->S/P DCCV;  d. 11/2013 Recurrent afib while off of flecainide (2/2 abnl nuc study)-->Flecainide resumed after nl cath-->DCCV;  e. Recurrent Afib 01/2014-->Flecainide increased to 150mg  BID.   Past Surgical History:  Procedure Laterality Date  . BREAST BIOPSY Right 2007   stereo-neg    . CARDIAC CATHETERIZATION  10/2013   [facility]  . CARDIOVERSION N/A 08/29/2017   Procedure: CARDIOVERSION;  Surgeon: Iran Ouch, MD;  Location: [facility] ORS;  Service: Cardiovascular;  Laterality: N/A;  . ECTOPIC PREGNANCY SURGERY    . TONSILLECTOMY      Current Outpatient Medications  Medication Sig Dispense Refill  . diclofenac sodium (VOLTAREN) 1 % GEL Apply 1 application topically 3 (three) times daily as needed (knee pain).    Stacey [name] diltiazem (DILACOR XR) 240 MG 24 hr capsule Take 240 mg by mouth daily.    Stacey [name] esomeprazole (NEXIUM) 40 MG capsule TAKE 1 CAPSULE BY MOUTH EVERY MORNING 30 capsule 0  . furosemide (LASIX) 40 MG tablet TAKE 1 TABLET BY MOUTH ONCE DAILY 30 tablet 0  . HYDROcodone-acetaminophen (NORCO) 10-325 MG tablet Take 1 tablet by mouth every 6 (six) hours as needed for moderate pain or severe pain.   0  . potassium chloride SA (K-DUR,KLOR-CON) 20 MEQ tablet Take 1 tablet (20 mEq total) by mouth daily. 90 tablet 3  . rivaroxaban (XARELTO) 20 MG TABS tablet take 1 tablet by mouth once daily WITH SUPPER 30 tablet 3  . venlafaxine (EFFEXOR) 75 MG tablet Take 75 mg by mouth 2 (two) times daily.    Stacey [name] zolpidem (AMBIEN) 10 MG tablet Take 10 mg by mouth at bedtime as needed for sleep.     Stacey [name] diltiazem (CARDIZEM CD) 120 MG 24 hr capsule Take 1 capsule (120 mg total)  by mouth at bedtime. 30 capsule 3   No current facility-administered medications for this encounter.     Allergies  Allergen Reactions  . Levaquin [Levofloxacin In D5w] Other (See Comments)    Severe dizziness   . Metoprolol Itching, Nausea And Vomiting, Rash and Other (See Comments)    Dizziness    Social History   Socioeconomic History  . Marital status: Married    Spouse name: Not on file  . Number of children: Not on file  . Years of education: Not on file  . Highest education level: Not on file  Occupational History  . Not on file  Social Needs  . Financial resource strain: Not on file  . Food insecurity:     Worry: Not on file    Inability: Not on file  . Transportation needs:    Medical: Not on file    Non-medical: Not on file  Tobacco Use  . Smoking status: Never Smoker  . Smokeless tobacco: Never Used  Substance and Sexual Activity  . Alcohol use: Yes    Comment: 1 glass of wine/day.  . Drug use: No  . Sexual activity: Not on file  Lifestyle  . Physical activity:    Days per week: Not on file    Minutes per session: Not on file  . Stress: Not on file  Relationships  . Social connections:    Talks on phone: Not on file    Gets together: Not on file    Attends religious service: Not on file    Active member of club or organization: Not on file    Attends meetings of clubs or organizations: Not on file    Relationship status: Not on file  . Intimate partner violence:    Fear of current or ex partner: Not on file    Emotionally abused: Not on file    Physically abused: Not on file    Forced sexual activity: Not on file  Other Topics Concern  . Not on file  Social History Narrative   Lives in [place].  Works as Water quality scientist.     Family History  Problem Relation Age of Onset  . CAD Father        s/p cabg - ? h/o afib.  Stacey [name] Heart attack Father   . Hypertension Mother   . Breast cancer Mother 48    ROS- All systems are reviewed and negative except as per the HPI above  Physical Exam: Vitals:   09/14/17 1456  BP: 136/74  Pulse: (!) 119  Weight: 239 lb 3.2 oz (108.5 kg)  Height: 5\' 3"  (1.6 m)   Wt Readings from Last 3 Encounters:  09/14/17 239 lb 3.2 oz (108.5 kg)  08/29/17 243 lb (110.2 kg)  08/26/17 248 lb 8 oz (112.7 kg)    Labs: Lab Results  Component Value Date   NA 141 07/01/2015   K 4.9 07/01/2015   CL 98 07/01/2015   CO2 20 07/01/2015   GLUCOSE 98 07/01/2015   BUN 17 07/01/2015   CREATININE 1.23 (H) 07/01/2015   CALCIUM 9.5 07/01/2015   MG 1.7 09/14/2017   Lab Results  Component Value Date   INR 2.0 02/01/2014   Lab Results   Component Value Date   CHOL 194 08/28/2013   HDL 56 08/28/2013   LDLCALC 114 (H) 08/28/2013   TRIG 122 08/28/2013     GEN- The patient is well appearing, alert and oriented x 3 today.  Head- normocephalic, atraumatic Eyes-  Sclera clear, conjunctiva pink Ears- hearing intact Oropharynx- clear Neck- supple, no JVP Lymph- no cervical lymphadenopathy Lungs- Clear to ausculation bilaterally, normal work of breathing Heart- Regular rate and rhythm, no murmurs, rubs or gallops, PMI not laterally displaced GI- soft, NT, ND, + BS Extremities- no clubbing, cyanosis, or edema MS- no significant deformity or atrophy Skin- no rash or lesion Psych- euthymic mood, full affect Neuro- strength and sensation are intact  EKG- afib at 119 bpm, Qrs int 98 ms, qtc 557 ms, ekg reads acute stemi but doe not fit clinically , pt without chest pain, stable, probably 2/2 to st changes with RVR. Echo-Study Conclusions  - Left ventricle: The cavity size was normal. Systolic function was   severely reduced. The estimated ejection fraction was in the   range of 20% to 25%. Diffuse hypokinesis. Regional wall motion   abnormalities cannot be excluded. The study is not technically   sufficient to allow evaluation of LV diastolic function. - Mitral valve: There was mild regurgitation. - Left atrium: The atrium was mildly dilated. - Right ventricle: Systolic function was grossly normal though   difficult to evaluate given tachycardia. . - Pulmonary arteries: Systolic pressure could not be accurately   estimated.  Impressions:  - Rate was markedly elevated, >130 BPM    Assessment and Plan: 1.Paroxysmal afib Successful cardioversion but with ERAF Discussed options to restore SR Tikosyn or ablation, best options Pt concerned re cost of drug Pt will  look into it, but thinking more so of ablation qtc in SR around 470 ms, worrisome for tikosyn, would have to discuss with Dr. Johney Frame if she prefers  antiarrythmic Will have PharmD to screen drugs Will stop flecainide as it is failing to keep the pt in rhythm She needs more rate control Optimal treatment with LV dysfunction in BB's but she is allergic, so will increase cardizem to an extra 120 mg at hs Continue xarelto 20 mg daily for a CHA2DS2VASc score of at least 4  Will plan appropriately when pt decides between change in antiarrythmic vrs ablation  Lupita Leash [facility] [address] [place], [place] [address] [phone]

## 2017-09-16 ENCOUNTER — Telehealth: Payer: Self-pay | Admitting: Cardiovascular Disease

## 2017-09-16 NOTE — Telephone Encounter (Signed)
Notes recorded by Jefferey Pica, RN on 09/16/2017 at 1:42 PM EDT I spoke with the patient regarding her results- she states Rudi Coco, NP also addressed this with her at her office visit on 09/14/17.  See 09/16/17 phone note. ------  Notes recorded by Iran Ouch, MD on 09/16/2017 at 12:56 PM EDT Inform patient that echo showed significant decrease in ejection fraction which is likely due to A. fib with RVR. We have to try to get her back in sinus rhythm as soon as possible to prevent further worsening of LV systolic function. She should pursue initiation of Tikosyn as suggested in the A. fib clinic

## 2017-09-16 NOTE — Telephone Encounter (Signed)
I spoke with Stacey [name].  She is aware of Dr. Jari Sportsman recommendations that a DCCV is not going to work for her until a different antiarrhythmic drug is on board. She is also aware that Dr. Kirke Corin and Dr. Johney Frame have spoken and what their discussion has been.  Per the patient- she has called the a-fib clinic and asked that they schedule her for an appointment with Dr. Johney Frame for consultation. She would like to see him to discuss this further. I have advised her that she has the current recommendations and she can let us know what she would like to do.  She is agreeable and voices understanding.

## 2017-09-16 NOTE — Telephone Encounter (Signed)
I spoke with patient regarding Dr. Jari Sportsman recommendations to start Tikosyn as soon as possible.  I inquired if she had priced this out and she states "that's not an issue, but I think I would rather have the ablation."  She asked if Dr. Kirke Corin would consider cardioverting her again and getting a consult for a-fib ablation. I advised the patient I would need to review with Dr. Kirke Corin.  The patient's HR was 119 bpm when she saw Rudi Coco, NP in the a-fib clinic on 09/14/17. Lupita Leash increased the patient's diltiazem to 240 mg in the morning and 120 mg in the evening. Per the patient, her HR is in the 80's today. The patient is aware I will review with Dr. Kirke Corin and call her back with his recommendations at this time.  She is agreeable.

## 2017-09-16 NOTE — Telephone Encounter (Signed)
I do not think a repeat cardioversion is going to work without a different antiarrhythmic medication. I discussed her case yesterday with Dr. Johney Frame.  Given that the heart is weak now, it is better to initiate Tikosyn and get her back in sinus rhythm.  Once she is back in sinus rhythm, the ejection fraction should improve and it will be safer to do ablation later if she is deemed to be a good candidate.

## 2017-09-16 NOTE — Telephone Encounter (Signed)
Patient called back decided she would prefer to discuss ablation with Dr. Johney Frame instead of Tikosyn admission. Scheduling messaged for appointment.

## 2017-09-16 NOTE — Telephone Encounter (Signed)
I left a message for the patient to call. 

## 2017-09-21 ENCOUNTER — Other Ambulatory Visit: Payer: Self-pay | Admitting: Cardiovascular Disease

## 2017-09-26 ENCOUNTER — Inpatient Hospital Stay (HOSPITAL_COMMUNITY)
Admission: RE | Admit: 2017-09-26 | Discharge: 2017-09-30 | DRG: 309 | Disposition: A | Discharge status: Home / Self Care | Payer: Medicare Other | Source: Ambulatory Visit | Attending: Internal Medicine | Admitting: Internal Medicine

## 2017-09-26 ENCOUNTER — Other Ambulatory Visit: Payer: Self-pay

## 2017-09-26 ENCOUNTER — Encounter (HOSPITAL_COMMUNITY): Payer: Self-pay | Admitting: General Practice

## 2017-09-26 ENCOUNTER — Encounter (HOSPITAL_COMMUNITY): Payer: Self-pay | Admitting: Nurse Practitioner

## 2017-09-26 ENCOUNTER — Ambulatory Visit (HOSPITAL_COMMUNITY)
Admission: RE | Admit: 2017-09-26 | Discharge: 2017-09-26 | Disposition: A | Discharge status: Home / Self Care | Payer: Medicare Other | Source: Ambulatory Visit | Attending: Nurse Practitioner | Admitting: Nurse Practitioner

## 2017-09-26 VITALS — BP 136/76 | HR 75 | Ht 63.0 in | Wt 249.0 lb

## 2017-09-26 DIAGNOSIS — I429 Cardiomyopathy, unspecified: Secondary | ICD-10-CM | POA: Diagnosis present

## 2017-09-26 DIAGNOSIS — Y92230 Patient room in hospital as the place of occurrence of the external cause: Secondary | ICD-10-CM | POA: Diagnosis not present

## 2017-09-26 DIAGNOSIS — Z888 Allergy status to other drugs, medicaments and biological substances status: Secondary | ICD-10-CM | POA: Diagnosis not present

## 2017-09-26 DIAGNOSIS — I110 Hypertensive heart disease with heart failure: Secondary | ICD-10-CM | POA: Diagnosis present

## 2017-09-26 DIAGNOSIS — Z803 Family history of malignant neoplasm of breast: Secondary | ICD-10-CM

## 2017-09-26 DIAGNOSIS — I4819 Other persistent atrial fibrillation: Secondary | ICD-10-CM | POA: Diagnosis present

## 2017-09-26 DIAGNOSIS — I472 Ventricular tachycardia: Secondary | ICD-10-CM | POA: Diagnosis not present

## 2017-09-26 DIAGNOSIS — G4733 Obstructive sleep apnea (adult) (pediatric): Secondary | ICD-10-CM | POA: Diagnosis present

## 2017-09-26 DIAGNOSIS — Z9229 Personal history of other drug therapy: Secondary | ICD-10-CM | POA: Diagnosis not present

## 2017-09-26 DIAGNOSIS — I480 Paroxysmal atrial fibrillation: Secondary | ICD-10-CM | POA: Diagnosis present

## 2017-09-26 DIAGNOSIS — Z6841 Body Mass Index (BMI) 40.0 and over, adult: Secondary | ICD-10-CM | POA: Diagnosis not present

## 2017-09-26 DIAGNOSIS — Z881 Allergy status to other antibiotic agents status: Secondary | ICD-10-CM | POA: Diagnosis not present

## 2017-09-26 DIAGNOSIS — Z79899 Other long term (current) drug therapy: Secondary | ICD-10-CM | POA: Diagnosis not present

## 2017-09-26 DIAGNOSIS — I5022 Chronic systolic (congestive) heart failure: Secondary | ICD-10-CM | POA: Diagnosis not present

## 2017-09-26 DIAGNOSIS — T462X5A Adverse effect of other antidysrhythmic drugs, initial encounter: Secondary | ICD-10-CM | POA: Diagnosis not present

## 2017-09-26 DIAGNOSIS — I4891 Unspecified atrial fibrillation: Secondary | ICD-10-CM | POA: Diagnosis not present

## 2017-09-26 DIAGNOSIS — I5032 Chronic diastolic (congestive) heart failure: Secondary | ICD-10-CM | POA: Diagnosis present

## 2017-09-26 DIAGNOSIS — I481 Persistent atrial fibrillation: Secondary | ICD-10-CM | POA: Diagnosis present

## 2017-09-26 DIAGNOSIS — Z8249 Family history of ischemic heart disease and other diseases of the circulatory system: Secondary | ICD-10-CM

## 2017-09-26 DIAGNOSIS — K219 Gastro-esophageal reflux disease without esophagitis: Secondary | ICD-10-CM | POA: Diagnosis present

## 2017-09-26 DIAGNOSIS — I2510 Atherosclerotic heart disease of native coronary artery without angina pectoris: Secondary | ICD-10-CM | POA: Diagnosis present

## 2017-09-26 DIAGNOSIS — I428 Other cardiomyopathies: Secondary | ICD-10-CM | POA: Diagnosis not present

## 2017-09-26 HISTORY — DX: Personal history of other medical treatment: Z92.89

## 2017-09-26 HISTORY — DX: Pneumonia, unspecified organism: J18.9

## 2017-09-26 HISTORY — DX: Unspecified osteoarthritis, unspecified site: M19.90

## 2017-09-26 HISTORY — DX: Dependence on other enabling machines and devices: Z99.89

## 2017-09-26 HISTORY — DX: Pure hypercholesterolemia, unspecified: E78.00

## 2017-09-26 HISTORY — DX: Obstructive sleep apnea (adult) (pediatric): G47.33

## 2017-09-26 HISTORY — DX: Anxiety disorder, unspecified: F41.9

## 2017-09-26 LAB — BASIC METABOLIC PANEL
Anion gap: 12 (ref 5–15)
Anion gap: 10 (ref 5–15)
BUN: 16 mg/dL (ref 6–20)
BUN: 20 mg/dL (ref 6–20)
CALCIUM: 9.6 mg/dL (ref 8.9–10.3)
CO2: 28 mmol/L (ref 22–32)
CO2: 29 mmol/L (ref 22–32)
CREATININE: 1.15 mg/dL — AB (ref 0.44–1.00)
CREATININE: 1.22 mg/dL — AB (ref 0.44–1.00)
Calcium: 9.1 mg/dL (ref 8.9–10.3)
Chloride: 102 mmol/L (ref 101–111)
Chloride: 101 mmol/L (ref 101–111)
GFR calc Af Amer: 53 mL/min — ABNORMAL LOW (ref >60)
GFR calc non Af Amer: 49 mL/min — ABNORMAL LOW (ref >60)
GFR, EST AFRICAN AMERICAN: 57 mL/min — AB (ref >60)
GFR, EST NON AFRICAN AMERICAN: 45 mL/min — AB (ref >60)
GLUCOSE: 121 mg/dL — AB (ref 65–99)
GLUCOSE: 116 mg/dL — AB (ref 65–99)
POTASSIUM: 4.3 mmol/L (ref 3.5–5.1)
Potassium: 3.7 mmol/L (ref 3.5–5.1)
SODIUM: 140 mmol/L (ref 135–145)
Sodium: 142 mmol/L (ref 135–145)

## 2017-09-26 LAB — MAGNESIUM
MAGNESIUM: 1.7 mg/dL (ref 1.7–2.4)
Magnesium: 1.8 mg/dL (ref 1.7–2.4)

## 2017-09-26 MED ORDER — ZOLPIDEM TARTRATE 5 MG PO TABS
5.0000 mg | ORAL_TABLET | Freq: Every evening | ORAL | Status: DC | PRN
  Administered 2017-09-26 – 2017-09-28 (×3): 5 mg via ORAL
  Filled 2017-09-26 (×3): qty 1

## 2017-09-26 MED ORDER — SODIUM CHLORIDE 0.9 % IV SOLN: 250.0000 mL | INTRAVENOUS | Status: DC | PRN

## 2017-09-26 MED ORDER — POTASSIUM CHLORIDE CRYS ER 20 MEQ PO TBCR
20.0000 meq | EXTENDED_RELEASE_TABLET | Freq: Every day | ORAL | Status: DC
  Administered 2017-09-27 – 2017-09-28 (×2): 20 meq via ORAL
  Filled 2017-09-26 (×2): qty 1

## 2017-09-26 MED ORDER — RIVAROXABAN 20 MG PO TABS
20.0000 mg | ORAL_TABLET | Freq: Every day | ORAL | Status: DC
  Administered 2017-09-26 – 2017-09-29 (×4): 20 mg via ORAL
  Filled 2017-09-26 (×4): qty 1

## 2017-09-26 MED ORDER — MAGNESIUM SULFATE 2 GM/50ML IV SOLN
2.0000 g | Freq: Once | INTRAVENOUS | Status: AC
  Administered 2017-09-26: 2 g via INTRAVENOUS
  Filled 2017-09-26: qty 50

## 2017-09-26 MED ORDER — CHLORHEXIDINE GLUCONATE 0.12 % MT SOLN
15.0000 mL | Freq: Two times a day (BID) | OROMUCOSAL | Status: DC
  Administered 2017-09-27: 15 mL via OROMUCOSAL
  Filled 2017-09-26 (×2): qty 15

## 2017-09-26 MED ORDER — DILTIAZEM HCL ER COATED BEADS 240 MG PO CP24
240.0000 mg | ORAL_CAPSULE | Freq: Every day | ORAL | Status: DC
  Administered 2017-09-27: 240 mg via ORAL
  Filled 2017-09-26 (×2): qty 1

## 2017-09-26 MED ORDER — HYDROCODONE-ACETAMINOPHEN 10-325 MG PO TABS
1.0000 | ORAL_TABLET | Freq: Four times a day (QID) | ORAL | Status: DC | PRN
Start: 2017-09-26 — End: 2017-09-30
  Administered 2017-09-26 – 2017-09-29 (×8): 1 via ORAL
  Filled 2017-09-26 (×8): qty 1

## 2017-09-26 MED ORDER — FUROSEMIDE 40 MG PO TABS
40.0000 mg | ORAL_TABLET | Freq: Every day | ORAL | Status: DC
  Administered 2017-09-27 – 2017-09-30 (×4): 40 mg via ORAL
  Filled 2017-09-26 (×4): qty 1

## 2017-09-26 MED ORDER — ORAL CARE MOUTH RINSE: 15.0000 mL | Freq: Two times a day (BID) | OROMUCOSAL | Status: DC

## 2017-09-26 MED ORDER — VENLAFAXINE HCL 75 MG PO TABS
75.0000 mg | ORAL_TABLET | Freq: Two times a day (BID) | ORAL | Status: DC
  Administered 2017-09-26 – 2017-09-30 (×8): 75 mg via ORAL
  Filled 2017-09-26 (×9): qty 1

## 2017-09-26 MED ORDER — MAGNESIUM OXIDE 400 (241.3 MG) MG PO TABS
400.0000 mg | ORAL_TABLET | Freq: Two times a day (BID) | ORAL | Status: DC
  Filled 2017-09-26: qty 1

## 2017-09-26 MED ORDER — DICLOFENAC SODIUM 1 % TD GEL
1.0000 "application " | Freq: Three times a day (TID) | TRANSDERMAL | Status: DC | PRN
  Administered 2017-09-29: 1 via TOPICAL
  Filled 2017-09-26: qty 100

## 2017-09-26 MED ORDER — MAGNESIUM OXIDE 400 (241.3 MG) MG PO TABS
400.0000 mg | ORAL_TABLET | Freq: Two times a day (BID) | ORAL | Status: DC
  Administered 2017-09-26 – 2017-09-30 (×8): 400 mg via ORAL
  Filled 2017-09-26 (×8): qty 1

## 2017-09-26 MED ORDER — DOFETILIDE 500 MCG PO CAPS: 500.0000 ug | ORAL_CAPSULE | Freq: Two times a day (BID) | ORAL | Status: DC

## 2017-09-26 MED ORDER — SODIUM CHLORIDE 0.9% FLUSH
3.0000 mL | Freq: Two times a day (BID) | INTRAVENOUS | Status: DC
  Administered 2017-09-26 – 2017-09-30 (×9): 3 mL via INTRAVENOUS

## 2017-09-26 MED ORDER — PANTOPRAZOLE SODIUM 40 MG PO TBEC
80.0000 mg | DELAYED_RELEASE_TABLET | Freq: Every day | ORAL | Status: DC
  Administered 2017-09-27 – 2017-09-30 (×3): 80 mg via ORAL
  Filled 2017-09-26 (×3): qty 2

## 2017-09-26 MED ORDER — SODIUM CHLORIDE 0.9% FLUSH: 3.0000 mL | INTRAVENOUS | Status: DC | PRN

## 2017-09-26 MED ORDER — DILTIAZEM HCL ER COATED BEADS 120 MG PO CP24
120.0000 mg | ORAL_CAPSULE | Freq: Every day | ORAL | Status: DC
  Administered 2017-09-26 – 2017-09-27 (×2): 120 mg via ORAL
  Filled 2017-09-26 (×2): qty 1

## 2017-09-26 MED ORDER — DOFETILIDE 500 MCG PO CAPS
500.0000 ug | ORAL_CAPSULE | Freq: Two times a day (BID) | ORAL | Status: DC
  Administered 2017-09-26 – 2017-09-28 (×4): 500 ug via ORAL
  Filled 2017-09-26 (×4): qty 1

## 2017-09-26 NOTE — Progress Notes (Signed)
Primary Care Physician: Marguarite Arbour, MD Referring Physician: Dr. Glynn Octave EP: Dr. Marinell Blight Stacey [name] is a 67 y.o. female with a h/o chronic diastolic HF, obesity, HTN, OSA on cpap, and paroxysmal afib in the clinic for evaluation. She has been on flecainde for years and afib burden has been low. She recently went into afib and had a successful cardioversion but with ERAF. Recent echo showed EF of 20-25%, but pt was in afib at time of echo at 130 bpm. Left atrium mildly dilated. Pt was seen by Dr. Johney Frame several years ago and offered Tikosyn or ablation but her afib burden trended down. Her qtc runs around 470 ms , which is on the  long side with tikosyn, prefer at baseline, no longer than 440 ms.  F/U afib clinic, 6/3. On last visit here, flecainide was stopped and Cardizem increased for RVR. She converted to SR the next day. She is now here for Tikosyn admit and remains in SR, QTc 446 ms at baseline.  Today, she denies symptoms of palpitations, chest pain, shortness of breath, orthopnea, PND, lower extremity edema, dizziness, presyncope, syncope, or neurologic sequela. The patient is tolerating medications without difficulties and is otherwise without complaint today.   Past Medical History:  Diagnosis Date  . Chronic diastolic CHF (congestive heart failure) (HCC)    a. Dx 08/2013 in setting of rapid afib;  b. 08/2013 Echo: EF 55-60%, mildly dil LA, nl RV.  Marland Kitchen Depression   . GERD (gastroesophageal reflux disease)   . Hypertension   . Non-obstructive CAD    a. 10/2013 Myoview: anteroseptal, apical, septal mild ischemia, nl LV fxn;  b. Cath: LAD 70m LCX 20ost, RCA 83m.  . Obesity   . OSA (obstructive sleep apnea)    a. on CPAP  . Paroxysmal atrial fibrillation (HCC)    a. Dx 08/2013. CHA2DS2VASc = 3 (diast chf, htn, female) -->Xarelto initiated; b. 09/2013 s/p DCCV;  c. 10/2013 recurrent afib->Flecainide added->S/P DCCV;  d. 11/2013 Recurrent afib while off of flecainide (2/2 abnl nuc  study)-->Flecainide resumed after nl cath-->DCCV;  e. Recurrent Afib 01/2014-->Flecainide increased to 150mg  BID.   Past Surgical History:  Procedure Laterality Date  . BREAST BIOPSY Right 2007   stereo-neg  . CARDIAC CATHETERIZATION  10/2013   [facility]  . CARDIOVERSION N/A 08/29/2017   Procedure: CARDIOVERSION;  Surgeon: Iran Ouch, MD;  Location: [facility] ORS;  Service: Cardiovascular;  Laterality: N/A;  . ECTOPIC PREGNANCY SURGERY    . TONSILLECTOMY      Current Outpatient Medications  Medication Sig Dispense Refill  . diclofenac sodium (VOLTAREN) 1 % GEL Apply 1 application topically 3 (three) times daily as needed (knee pain).    Marland Kitchen diltiazem (CARDIZEM CD) 120 MG 24 hr capsule Take 1 capsule (120 mg total) by mouth at bedtime. 30 capsule 3  . diltiazem (DILACOR XR) 240 MG 24 hr capsule Take 240 mg by mouth daily.    Marland Kitchen esomeprazole (NEXIUM) 40 MG capsule TAKE 1 CAPSULE BY MOUTH EVERY MORNING 30 capsule 0  . furosemide (LASIX) 40 MG tablet TAKE 1 TABLET BY MOUTH ONCE DAILY 30 tablet 0  . HYDROcodone-acetaminophen (NORCO) 10-325 MG tablet Take 1 tablet by mouth every 6 (six) hours as needed for moderate pain or severe pain.   0  . magnesium oxide (MAG-OX) 400 MG tablet Take 400 mg by mouth daily.    . potassium chloride SA (K-DUR,KLOR-CON) 20 MEQ tablet Take 1 tablet (20 mEq total) by mouth daily.  90 tablet 3  . rivaroxaban (XARELTO) 20 MG TABS tablet take 1 tablet by mouth once daily WITH SUPPER 30 tablet 3  . venlafaxine (EFFEXOR) 75 MG tablet Take 75 mg by mouth 2 (two) times daily.    Marland Kitchen zolpidem (AMBIEN) 10 MG tablet Take 10 mg by mouth at bedtime as needed for sleep.      No current facility-administered medications for this encounter.     Allergies  Allergen Reactions  . Levaquin [Levofloxacin In D5w] Other (See Comments)    Severe dizziness   . Metoprolol Itching, Nausea And Vomiting, Rash and Other (See Comments)    Dizziness    Social History   Socioeconomic History    . Marital status: Married    Spouse name: Not on file  . Number of children: Not on file  . Years of education: Not on file  . Highest education level: Not on file  Occupational History  . Not on file  Social Needs  . Financial resource strain: Not on file  . Food insecurity:    Worry: Not on file    Inability: Not on file  . Transportation needs:    Medical: Not on file    Non-medical: Not on file  Tobacco Use  . Smoking status: Never Smoker  . Smokeless tobacco: Never Used  Substance and Sexual Activity  . Alcohol use: Yes    Comment: 1 glass of wine/day.  . Drug use: No  . Sexual activity: Not on file  Lifestyle  . Physical activity:    Days per week: Not on file    Minutes per session: Not on file  . Stress: Not on file  Relationships  . Social connections:    Talks on phone: Not on file    Gets together: Not on file    Attends religious service: Not on file    Active member of club or organization: Not on file    Attends meetings of clubs or organizations: Not on file    Relationship status: Not on file  . Intimate partner violence:    Fear of current or ex partner: Not on file    Emotionally abused: Not on file    Physically abused: Not on file    Forced sexual activity: Not on file  Other Topics Concern  . Not on file  Social History Narrative   Lives in [place].  Works as Water quality scientist.     Family History  Problem Relation Age of Onset  . CAD Father        s/p cabg - ? h/o afib.  Marland Kitchen Heart attack Father   . Hypertension Mother   . Breast cancer Mother 59    ROS- All systems are reviewed and negative except as per the HPI above  Physical Exam: Vitals:   09/26/17 0950  BP: 136/76  Pulse: 75  Weight: 249 lb (112.9 kg)  Height: 5\' 3"  (1.6 m)   Wt Readings from Last 3 Encounters:  09/26/17 249 lb (112.9 kg)  09/14/17 239 lb 3.2 oz (108.5 kg)  08/29/17 243 lb (110.2 kg)    Labs: Lab Results  Component Value Date   NA 142 09/26/2017    K 3.7 09/26/2017   CL 102 09/26/2017   CO2 28 09/26/2017   GLUCOSE 121 (H) 09/26/2017   BUN 16 09/26/2017   CREATININE 1.15 (H) 09/26/2017   CALCIUM 9.6 09/26/2017   MG 1.8 09/26/2017   Lab Results  Component Value  Date   INR 2.0 02/01/2014   Lab Results  Component Value Date   CHOL 194 08/28/2013   HDL 56 08/28/2013   LDLCALC 114 (H) 08/28/2013   TRIG 122 08/28/2013     GEN- The patient is well appearing, alert and oriented x 3 today.   Head- normocephalic, atraumatic Eyes-  Sclera clear, conjunctiva pink Ears- hearing intact Oropharynx- clear Neck- supple, no JVP Lymph- no cervical lymphadenopathy Lungs- Clear to ausculation bilaterally, normal work of breathing Heart- Regular rate and rhythm, no murmurs, rubs or gallops, PMI not laterally displaced GI- soft, NT, ND, + BS Extremities- no clubbing, cyanosis, or edema MS- no significant deformity or atrophy Skin- no rash or lesion Psych- euthymic mood, full affect Neuro- strength and sensation are intact  EKG- NSR at 75 bpm, pr int 156 ms, qrs int 84 ms, qtc 446 ms Epic records reviewed Echo-Study Conclusions  - Left ventricle: The cavity size was normal. Systolic function was   severely reduced. The estimated ejection fraction was in the   range of 20% to 25%. Diffuse hypokinesis. Regional wall motion   abnormalities cannot be excluded. The study is not technically   sufficient to allow evaluation of LV diastolic function. - Mitral valve: There was mild regurgitation. - Left atrium: The atrium was mildly dilated. - Right ventricle: Systolic function was grossly normal though   difficult to evaluate given tachycardia. . - Pulmonary arteries: Systolic pressure could not be accurately   estimated.  Impressions:  - Rate was markedly elevated, >130 BPM    Assessment and Plan: 1.Paroxysmal afib Successful cardioversion, 5/6, but with ERAF Recent echo with reduced EF, probably [facility]MC Pt is now back in SR Pt  is aware of the cost of drug qtc in SR 446 ms today, acceptable for Tikosyn admit PharmD  screened drugs and no offending qtc  drugs Pt has been off flecainide for a couple of weeks  Continue cardizem at current dose Continue xarelto 20 mg daily for a CHA2DS2VASc score of at least 4, no missed doses No benadryl use Bmet/mag today  K+ at 3.7 , mag at 1.8 , crcl cal at  86.96, will go home and take her K+ and magnesium as she has not taken today. Anticipate bed to be ready early afternoon     [name] [address] [place], [place] [address] [phone]

## 2017-09-26 NOTE — H&P (Signed)
Primary Care Physician: Stacey Arbour, MD Referring Physician: Dr. Glynn [name] EP: Dr. Marinell Blight [name] is a 67 y.o. female with a h/o chronic diastolic HF, obesity, HTN, OSA on cpap, and paroxysmal afib in the clinic for evaluation. She has been on flecainde for years and afib burden has been low. She recently went into afib and had a successful cardioversion but with ERAF. Recent echo showed EF of 20-25%, but pt was in afib at time of echo at 130 bpm. Left atrium mildly dilated. Pt was seen by Dr. Johney [name] several years ago and offered Tikosyn or ablation but her afib burden trended down. Her qtc runs around 470 ms , which is on the  long side with tikosyn, prefer at baseline, no longer than 440 ms.  F/U afib clinic, 6/3. On last visit here, flecainide was stopped and Cardizem increased for RVR. She converted to SR the next day. She is now here for Tikosyn admit and remains in SR, QTc 446 ms at baseline.  Today, she denies symptoms of palpitations, chest pain, shortness of breath, orthopnea, PND, lower extremity edema, dizziness, presyncope, syncope, or neurologic sequela. The patient is tolerating medications without difficulties and is otherwise without complaint today.       Past Medical History:  Diagnosis Date  . Chronic diastolic CHF (congestive heart failure) (HCC)    a. Dx 08/2013 in setting of rapid afib;  b. 08/2013 Echo: EF 55-60%, mildly dil LA, nl RV.  Stacey [name] Depression   . GERD (gastroesophageal reflux disease)   . Hypertension   . Non-obstructive CAD    a. 10/2013 Myoview: anteroseptal, apical, septal mild ischemia, nl LV fxn;  b. Cath: LAD 87m LCX 20ost, RCA 31m.  . Obesity   . OSA (obstructive sleep apnea)    a. on CPAP  . Paroxysmal atrial fibrillation (HCC)    a. Dx 08/2013. CHA2DS2VASc = 3 (diast chf, htn, female) -->Xarelto initiated; b. 09/2013 s/p DCCV;  c. 10/2013 recurrent afib->Flecainide added->S/P DCCV;  d. 11/2013 Recurrent afib while off of  flecainide (2/2 abnl nuc study)-->Flecainide resumed after nl cath-->DCCV;  e. Recurrent Afib 01/2014-->Flecainide increased to 150mg  BID.        Past Surgical History:  Procedure Laterality Date  . BREAST BIOPSY Right 2007   stereo-neg  . CARDIAC CATHETERIZATION  10/2013   [facility]  . CARDIOVERSION N/A 08/29/2017   Procedure: CARDIOVERSION;  Surgeon: Stacey Ouch, MD;  Location: [facility] ORS;  Service: Cardiovascular;  Laterality: N/A;  . ECTOPIC PREGNANCY SURGERY    . TONSILLECTOMY            Current Outpatient Medications  Medication Sig Dispense Refill  . diclofenac sodium (VOLTAREN) 1 % GEL Apply 1 application topically 3 (three) times daily as needed (knee pain).    Stacey [name] diltiazem (CARDIZEM CD) 120 MG 24 hr capsule Take 1 capsule (120 mg total) by mouth at bedtime. 30 capsule 3  . diltiazem (DILACOR XR) 240 MG 24 hr capsule Take 240 mg by mouth daily.    Stacey [name] esomeprazole (NEXIUM) 40 MG capsule TAKE 1 CAPSULE BY MOUTH EVERY MORNING 30 capsule 0  . furosemide (LASIX) 40 MG tablet TAKE 1 TABLET BY MOUTH ONCE DAILY 30 tablet 0  . HYDROcodone-acetaminophen (NORCO) 10-325 MG tablet Take 1 tablet by mouth every 6 (six) hours as needed for moderate pain or severe pain.   0  . magnesium oxide (MAG-OX) 400 MG tablet Take 400 mg by mouth daily.    . potassium chloride  SA (K-DUR,KLOR-CON) 20 MEQ tablet Take 1 tablet (20 mEq total) by mouth daily. 90 tablet 3  . rivaroxaban (XARELTO) 20 MG TABS tablet take 1 tablet by mouth once daily WITH SUPPER 30 tablet 3  . venlafaxine (EFFEXOR) 75 MG tablet Take 75 mg by mouth 2 (two) times daily.    Stacey [name] zolpidem (AMBIEN) 10 MG tablet Take 10 mg by mouth at bedtime as needed for sleep.      No current facility-administered medications for this encounter.     Allergies  Allergen Reactions  . Levaquin [Levofloxacin In D5w] Other (See Comments)    Severe dizziness   . Metoprolol Itching, Nausea And Vomiting, Rash and Other (See  Comments)    Dizziness    Social History        Socioeconomic History  . Marital status: Married    Spouse name: Not on file  . Number of children: Not on file  . Years of education: Not on file  . Highest education level: Not on file  Occupational History  . Not on file  Social Needs  . Financial resource strain: Not on file  . Food insecurity:    Worry: Not on file    Inability: Not on file  . Transportation needs:    Medical: Not on file    Non-medical: Not on file  Tobacco Use  . Smoking status: Never Smoker  . Smokeless tobacco: Never Used  Substance and Sexual Activity  . Alcohol use: Yes    Comment: 1 glass of wine/day.  . Drug use: No  . Sexual activity: Not on file  Lifestyle  . Physical activity:    Days per week: Not on file    Minutes per session: Not on file  . Stress: Not on file  Relationships  . Social connections:    Talks on phone: Not on file    Gets together: Not on file    Attends religious service: Not on file    Active member of club or organization: Not on file    Attends meetings of clubs or organizations: Not on file    Relationship status: Not on file  . Intimate partner violence:    Fear of current or ex partner: Not on file    Emotionally abused: Not on file    Physically abused: Not on file    Forced sexual activity: Not on file  Other Topics Concern  . 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          Family History  Problem Relation Age of Onset  . CAD Father        s/p cabg - ? h/o afib.  Stacey [name] Heart attack Father   . Hypertension Mother   . Breast cancer Mother 66    ROS- All systems are reviewed and negative except as per the HPI above  Physical Exam:    Vitals:   09/26/17 0950  BP: 136/76  Pulse: 75  Weight: 249 lb (112.9 kg)  Height: 5\' 3"  (1.6 m)      Wt Readings from Last 3 Encounters:  09/26/17 249 lb  (112.9 kg)  09/14/17 239 lb 3.2 oz (108.5 kg)  08/29/17 243 lb (110.2 kg)    Labs: RecentLabs       Lab Results  Component Value Date   NA 142 09/26/2017   K 3.7 09/26/2017   CL 102 09/26/2017   CO2  28 09/26/2017   GLUCOSE 121 (H) 09/26/2017   BUN 16 09/26/2017   CREATININE 1.15 (H) 09/26/2017   CALCIUM 9.6 09/26/2017   MG 1.8 09/26/2017     RecentLabs  Lab Results  Component Value Date   INR 2.0 02/01/2014     RecentLabs       Lab Results  Component Value Date   CHOL 194 08/28/2013   HDL 56 08/28/2013   LDLCALC 114 (H) 08/28/2013   TRIG 122 08/28/2013       GEN- The patient is well appearing, alert and oriented x 3 today.   Head- normocephalic, atraumatic Eyes-  Sclera clear, conjunctiva pink Ears- hearing intact Oropharynx- clear Neck- supple, no JVP Lymph- no cervical lymphadenopathy Lungs- Clear to ausculation bilaterally, normal work of breathing Heart- Regular rate and rhythm, no murmurs, rubs or gallops, PMI not laterally displaced GI- soft, NT, ND, + BS Extremities- no clubbing, cyanosis, or edema MS- no significant deformity or atrophy Skin- no rash or lesion Psych- euthymic mood, full affect Neuro- strength and sensation are intact  EKG- NSR at 75 bpm, pr int 156 ms, qrs int 84 ms, qtc 446 ms Epic records reviewed Echo-Study Conclusions  - Left ventricle: The cavity size was normal. Systolic function was severely reduced. The estimated ejection fraction was in the range of 20% to 25%. Diffuse hypokinesis. Regional wall motion abnormalities cannot be excluded. The study is not technically sufficient to allow evaluation of LV diastolic function. - Mitral valve: There was mild regurgitation. - Left atrium: The atrium was mildly dilated. - Right ventricle: Systolic function was grossly normal though difficult to evaluate given tachycardia. . - Pulmonary arteries: Systolic pressure could not be  accurately estimated.  Impressions:  - Rate was markedly elevated, >130 BPM    Assessment and Plan: 1.Paroxysmal afib Successful cardioversion, 5/6, but with ERAF Recent echo with reduced EF, probably TMC Pt is now back in SR Pt is aware of the cost of drug qtc in SR 446 ms today, acceptable for Tikosyn admit PharmD  screened drugs and no offending qtc  drugs Pt has been off flecainide for a couple of weeks  Continue cardizem at current dose Continue xarelto 20 mg daily for a CHA2DS2VASc score of at least 4, no missed doses No benadryl use Bmet/mag today  K+ at 3.7 , mag at 1.8 , crcl cal at  86.96, will go home and take her K+ and magnesium as she has not taken today. Anticipate bed to be ready early afternoon     Lupita Leash C. [facility] Fol[facility] [address] [place], [place] [address] [phone]     EP Attending  Patient seen and examined. Agree with above. Exam is as noted above and reflects my findings as well. The patient presents today for initiation of dofetilide. Previously she was on flecainide with return to atrial fib. She will be observed on tele for 3 days. We will follow the QT intervals. She will be cardioverted again if she does not revert back to NSR.  Vicent Febles,M.D.  Leonia Reeves.D.

## 2017-09-26 NOTE — Progress Notes (Signed)
Pharmacy Review for Dofetilide (Tikosyn) Initiation  Admit Complaint: 67 y.o. female admitted 09/26/2017 with atrial fibrillation to be initiated on dofetilide.   Assessment:  Patient Exclusion Criteria: If any screening criteria checked as "Yes", then  patient  should NOT receive dofetilide until criteria item is corrected. If "Yes" please indicate correction plan.  YES  NO Patient  Exclusion Criteria Correction Plan  [x]  []  Baseline QTc interval is greater than or equal to 440 msec. IF above YES box checked dofetilide contraindicated unless patient has ICD; then may proceed if QTc 500-550 msec or with known ventricular conduction abnormalities may proceed with QTc 550-600 msec. QTc = 0.45 MD aware, baseline QTC ~ 470  [x]  []  Magnesium level is less than 1.8 mEq/l : Last magnesium:  Lab Results  Component Value Date   MG 1.7 09/26/2017       Receiving 2g of IV magnesium now  []  [x]  Potassium level is less than 4 mEq/l : Last potassium:  Lab Results  Component Value Date   K 4.3 09/26/2017         []  [x]  Patient is known or suspected to have a digoxin level greater than 2 ng/ml: Lab Results  Component Value Date   DIGOXIN 1.5 09/06/2013      []  [x]  Creatinine clearance less than 20 ml/min (calculated using Cockcroft-Gault, actual body weight and serum creatinine): Estimated Creatinine Clearance: 55.6 mL/min (A) (by C-G formula based on SCr of 1.22 mg/dL (H)).    []  [x]  Patient has received drugs known to prolong the QT intervals within the last 48 hours (phenothiazines, tricyclics or tetracyclic antidepressants, erythromycin, H-1 antihistamines, cisapride, fluoroquinolones, azithromycin). Drugs not listed above may have an, as yet, undetected potential to prolong the QT interval, updated information on QT prolonging agents is available at this website:QT prolonging agents   []  [x]  Patient received a dose of hydrochlorothiazide (Oretic) alone or in any combination including  triamterene (Dyazide, Maxzide) in the last 48 hours.   []  [x]  Patient received a medication known to increase dofetilide plasma concentrations prior to initial dofetilide dose:  . Trimethoprim (Primsol, Proloprim) in the last 36 hours . Verapamil (Calan, Verelan) in the last 36 hours or a sustained release dose in the last 72 hours . Megestrol (Megace) in the last 5 days  . Cimetidine (Tagamet) in the last 6 hours . Ketoconazole (Nizoral) in the last 24 hours . Itraconazole (Sporanox) in the last 48 hours  . Prochlorperazine (Compazine) in the last 36 hours    []  [x]  Patient is known to have a history of torsades de pointes; congenital or acquired long QT syndromes.   []  [x]  Patient has received a Class 1 antiarrhythmic with less than 2 half-lives since last dose. (Disopyramide, Quinidine, Procainamide, Lidocaine, Mexiletine, Flecainide, Propafenone)   []  [x]  Patient has received amiodarone therapy in the past 3 months or amiodarone level is greater than 0.3 ng/ml.    Patient has been appropriately anticoagulated with Xarelto.  Ordering provider was confirmed at [email or web address] if they are not listed on the [facility] Authorized Prescribers list.  Goal of Therapy: Follow renal function, electrolytes, potential drug interactions, and dose adjustment. Provide education and 1 week supply at discharge.  Plan:  []   Physician selected initial dose within range recommended for patients level of renal function - will monitor for response.  [x]   Physician selected initial dose outside of range recommended for patients level of renal function - will discuss if the dose should be altered at  this time.   Select One Calculated CrCl  Dose q12h  []  > 60 ml/min 500 mcg  []  40-60 ml/min 250 mcg  []  20-40 ml/min 125 mcg  *Spoke to [employer], NP - will continue with 500 mg dose for now and monitor AM QTC.  CrCl is right around 55-60 currently.  2. Follow up QTc after the first 5 doses, renal  function, electrolytes (K & Mg) daily x 3     days, dose adjustment, success of initiation and facilitate 1 week discharge supply as     clinically indicated.  3. Initiate Tikosyn education video (Call [phone] and ask for Tikosyn Video # 116).  4. Place Enrollment Form on the chart for discharge supply of dofetilide.   Gardner Candle 4:50 PM 09/26/2017

## 2017-09-27 ENCOUNTER — Ambulatory Visit: Payer: Medicare Other | Admitting: Cardiovascular Disease

## 2017-09-27 LAB — MAGNESIUM: MAGNESIUM: 1.9 mg/dL (ref 1.7–2.4)

## 2017-09-27 LAB — BASIC METABOLIC PANEL
Anion gap: 9 (ref 5–15)
BUN: 14 mg/dL (ref 6–20)
CHLORIDE: 102 mmol/L (ref 101–111)
CO2: 27 mmol/L (ref 22–32)
Calcium: 8.8 mg/dL — ABNORMAL LOW (ref 8.9–10.3)
Creatinine, Ser: 0.94 mg/dL (ref 0.44–1.00)
Glucose, Bld: 109 mg/dL — ABNORMAL HIGH (ref 65–99)
Potassium: 3.5 mmol/L (ref 3.5–5.1)
SODIUM: 138 mmol/L (ref 135–145)

## 2017-09-27 LAB — HIV ANTIBODY (ROUTINE TESTING W REFLEX): HIV Screen 4th Generation wRfx: NONREACTIVE

## 2017-09-27 MED ORDER — POTASSIUM CHLORIDE CRYS ER 20 MEQ PO TBCR
40.0000 meq | EXTENDED_RELEASE_TABLET | Freq: Once | ORAL | Status: AC
  Administered 2017-09-27: 40 meq via ORAL
  Filled 2017-09-27: qty 2

## 2017-09-27 NOTE — Care Management Note (Signed)
Case Management Note Donn Pierini RN, BSN Unit 4E-Case Manager [phone]  Patient Details  Name: Stacey [name] MRN: [id number] Date of Birth: 13-Apr-1951  Subjective/Objective:  Pt admitted with afib for Tikosyn load                  Action/Plan: PTA pt lived at home with spouse- independent with ADLs. - referral for Tikosyn needs received- per pt she has drug coverage and has called to check on copay cost with her drug plan- per pt her copay cost is $35/mo- pt uses [facility] on [address]. In [place]. Pt will need 7 day supply of Tikosyn from [facility] prior to discharge- MD will need to provide script for 7 day supply to send down to [facility]- pt will also need script sent to her [facility] for them to be able to order drug in stock. CM to follow for any further transition of care needs.   Expected Discharge Date:    09/29/17              Expected Discharge Plan:  Home/Self Care  In-House Referral:  NA  Discharge planning Services  CM Consult, Medication Assistance  Post Acute Care Choice:  NA Choice offered to:     DME Arranged:    DME Agency:     HH Arranged:    HH Agency:     Status of Service:  Completed, signed off  If discussed at Long Length of Stay Meetings, dates discussed:    Discharge Disposition: home/self care   Additional Comments:  Darrold Span, RN 09/27/2017, 11:03 AM

## 2017-09-27 NOTE — Progress Notes (Addendum)
   Electrophysiology Rounding Note  Patient Name: Stacey [name] Date of Encounter: 09/27/2017  Primary Cardiologist: Kirke Corin Electrophysiologist: Graciela Husbands   Subjective   The patient is doing well today.  At this time, the patient denies chest pain, shortness of breath, or any new concerns.  Inpatient Medications    Scheduled Meds: . chlorhexidine  15 mL Mouth Rinse BID  . diltiazem  120 mg Oral QHS  . diltiazem  240 mg Oral Daily  . dofetilide  500 mcg Oral Q12H  . furosemide  40 mg Oral Daily  . magnesium oxide  400 mg Oral BID  . mouth rinse  15 mL Mouth Rinse q12n4p  . pantoprazole  80 mg Oral Q1200  . potassium chloride SA  20 mEq Oral Daily  . rivaroxaban  20 mg Oral Q supper  . sodium chloride flush  3 mL Intravenous Q12H  . venlafaxine  75 mg Oral BID   Continuous Infusions: . sodium chloride     PRN Meds: sodium chloride, diclofenac sodium, HYDROcodone-acetaminophen, sodium chloride flush, zolpidem   Vital Signs    Vitals:   09/26/17 2117 09/26/17 2213 09/27/17 0509 09/27/17 0747  BP: (!) 178/93  (!) 176/77 (!) 147/70  Pulse:  83 69   Resp:  18 (!) 22 18  Temp:   97.7 F (36.5 C)   TempSrc:   Oral   SpO2:  96% 95%   Weight:      Height:       No intake or output data in the 24 hours ending 09/27/17 0900 Filed Weights   09/26/17 1356 09/26/17 1950  Weight: 248 lb 10.9 oz (112.8 kg) 248 lb 9.6 oz (112.8 kg)    Physical Exam    GEN- The patient is well appearing, alert and oriented x 3 today.   Head- normocephalic, atraumatic Eyes-  Sclera clear, conjunctiva pink Ears- hearing intact Oropharynx- clear Neck- supple Lungs- Clear to ausculation bilaterally, normal work of breathing Heart- Regular rate and rhythm  GI- soft, NT, ND, + BS Extremities- no clubbing, cyanosis, or edema Skin- no rash or lesion Psych- euthymic mood, full affect Neuro- strength and sensation are intact  Labs    CBC No results for input(s): WBC, NEUTROABS, HGB, HCT, MCV,  PLT in the last 72 hours. Basic Metabolic Panel Recent Labs    73/42/87 1420 09/27/17 0313  NA 140 138  K 4.3 3.5  CL 101 102  CO2 29 27  GLUCOSE 116* 109*  BUN 20 14  CREATININE 1.22* 0.94  CALCIUM 9.1 8.8*  MG 1.7 1.9    Telemetry    AF -> SR (personally reviewed)  Radiology    No results found.   Patient Profile     Stacey [name] is a 67 y.o. female admitted for Tikosyn load  Assessment & Plan    1.  Persistent AF Admitted for Tikosyn load, converted after 1st dose QTc ok this morning, will supplement K+, Mg ok  Continue Xarelto for CHADS2VASC of 3  2.  HTN Stable No change required today  3. OSA Compliant with CPAP  She will follow with Dr Graciela Husbands in [place], he will round starting tomorrow   Signed, Gypsy Balsam, NP  09/27/2017, 9:00 AM   EP Attending  Patient seen and examined. She has reverted back to NSR and her initial QT is ok. She will continue dofetilide. We will watch her QT interval closely and replete electrolytes as needed.   Leonia Reeves.D.

## 2017-09-27 NOTE — Progress Notes (Signed)
Post tikosyn EKG shows 495 QTc.

## 2017-09-28 ENCOUNTER — Encounter (HOSPITAL_COMMUNITY)
Admission: RE | Disposition: A | Discharge status: Home / Self Care | Payer: Self-pay | Source: Ambulatory Visit | Attending: Internal Medicine

## 2017-09-28 DIAGNOSIS — I428 Other cardiomyopathies: Secondary | ICD-10-CM

## 2017-09-28 DIAGNOSIS — Z9229 Personal history of other drug therapy: Secondary | ICD-10-CM

## 2017-09-28 DIAGNOSIS — I5022 Chronic systolic (congestive) heart failure: Secondary | ICD-10-CM

## 2017-09-28 LAB — BASIC METABOLIC PANEL
Anion gap: 7 (ref 5–15)
BUN: 13 mg/dL (ref 6–20)
CALCIUM: 9 mg/dL (ref 8.9–10.3)
CHLORIDE: 105 mmol/L (ref 101–111)
CO2: 28 mmol/L (ref 22–32)
Creatinine, Ser: 0.95 mg/dL (ref 0.44–1.00)
GFR calc Af Amer: >60 mL/min (ref >60)
GFR calc non Af Amer: >60 mL/min (ref >60)
GLUCOSE: 104 mg/dL — AB (ref 65–99)
Potassium: 3.9 mmol/L (ref 3.5–5.1)
Sodium: 140 mmol/L (ref 135–145)

## 2017-09-28 LAB — MAGNESIUM: Magnesium: 2 mg/dL (ref 1.7–2.4)

## 2017-09-28 SURGERY — CARDIOVERSION
Anesthesia: General

## 2017-09-28 MED ORDER — LOSARTAN POTASSIUM 25 MG PO TABS
25.0000 mg | ORAL_TABLET | Freq: Every day | ORAL | Status: DC
  Administered 2017-09-28: 25 mg via ORAL
  Filled 2017-09-28: qty 1

## 2017-09-28 MED ORDER — SPIRONOLACTONE 12.5 MG HALF TABLET
12.5000 mg | ORAL_TABLET | Freq: Every day | ORAL | Status: DC
  Administered 2017-09-28: 12.5 mg via ORAL
  Filled 2017-09-28 (×2): qty 1

## 2017-09-28 MED ORDER — CARVEDILOL 12.5 MG PO TABS
12.5000 mg | ORAL_TABLET | Freq: Two times a day (BID) | ORAL | Status: DC
  Administered 2017-09-28 – 2017-09-30 (×5): 12.5 mg via ORAL
  Filled 2017-09-28 (×4): qty 1

## 2017-09-28 NOTE — Progress Notes (Signed)
Electrophysiology Rounding Note  Patient Name: Stacey [name] Date of Encounter: 09/28/2017  Primary Cardiologist: Kirke Corin Electrophysiologist: Graciela Husbands   Subjective   Without complaints of shortness of breath.  Disappointed with decisions that we made today because of the arrhythmia  Inpatient Medications    Scheduled Meds: . carvedilol  12.5 mg Oral BID WC  . chlorhexidine  15 mL Mouth Rinse BID  . furosemide  40 mg Oral Daily  . losartan  25 mg Oral Daily  . magnesium oxide  400 mg Oral BID  . mouth rinse  15 mL Mouth Rinse q12n4p  . pantoprazole  80 mg Oral Q1200  . potassium chloride SA  20 mEq Oral Daily  . rivaroxaban  20 mg Oral Q supper  . sodium chloride flush  3 mL Intravenous Q12H  . spironolactone  12.5 mg Oral Daily  . venlafaxine  75 mg Oral BID   Continuous Infusions: . sodium chloride     PRN Meds: sodium chloride, diclofenac sodium, HYDROcodone-acetaminophen, sodium chloride flush, zolpidem   Vital Signs    Vitals:   09/27/17 0747 09/27/17 1337 09/27/17 1949 09/28/17 0619  BP: (!) 147/70 116/80 (!) 150/77 (!) 149/67  Pulse:  66 64 67  Resp: 18 19 18  (!) 21  Temp:  98 F (36.7 C) 98.4 F (36.9 C) 98.2 F (36.8 C)  TempSrc:  Oral Oral Oral  SpO2:  95% 97% 94%  Weight:      Height:       No intake or output data in the 24 hours ending 09/28/17 0943 Filed Weights   09/26/17 1356 09/26/17 1950  Weight: 248 lb 10.9 oz (112.8 kg) 248 lb 9.6 oz (112.8 kg)    Physical Exam  Well developed and nourished in no acute distress HENT normal Neck supple with JVP-flat Clear Regular rate and rhythm, no murmurs or gallops Abd-soft with active BS No Clubbing cyanosis edema Skin-warm and dry A & Oriented  Grossly normal sensory and motor function   Labs    CBC No results for input(s): WBC, NEUTROABS, HGB, HCT, MCV, PLT in the last 72 hours. Basic Metabolic Panel Recent Labs    16/55/37 0313 09/28/17 0351  NA 138 140  K 3.5 3.9  CL 102 105    CO2 27 28  GLUCOSE 109* 104*  BUN 14 13  CREATININE 0.94 0.95  CALCIUM 8.8* 9.0  MG 1.9 2.0    Telemetry    Sinus rhythm.  An episode of torsade was noted yesterday a.m.  Radiology    No results found.   Patient Profile     Stacey [name] is a 67 y.o. female admitted for Tikosyn load  Assessment & Plan    Atrial fibrillation-persistent  Torsade de pointes-drug-induced  Cardiomyopathy question mechanism  Hypertension  Sleep apnea-treated  \  The patient had an episode of torsades yesterday morning.  This precludes further use of class III drugs.  We will stop the dofetilide.  It has a half-life of approximately 10 hours.  She unfortunately got her dose this morning.  She will need to be observed in hospital for at least another 48 hours.  We have discussed the role of amiodarone.  Given her young age, it would be used as a bridge to catheter ablation.  We will anticipate starting it on Friday with discharge from hospital probably   Thursday p.m. with a discharge from hospital Friday p.m. /Saturday morning  In addition, she has a significant cardiomyopathy.  She was  intolerant of metoprolol in the past.  We will put her on carvedilol.  Her blood pressure is sufficiently high and will also add ACE/ARB.  Unfortunately, she was ACE intolerant with a cough.  We will begin with losartan.  Given her propensity to hypokalemia we will also start on low-dose spironolactone.  This will further have an impact on her cardiomyopathy.  More than 50% of 45 min was spent in counseling related to the above

## 2017-09-28 NOTE — Progress Notes (Signed)
Patient has home CPAP unit and places self on when ready. RT will continue to monitor.

## 2017-09-29 LAB — BASIC METABOLIC PANEL
Anion gap: 9 (ref 5–15)
BUN: 18 mg/dL (ref 6–20)
CHLORIDE: 103 mmol/L (ref 101–111)
CO2: 30 mmol/L (ref 22–32)
CREATININE: 1.13 mg/dL — AB (ref 0.44–1.00)
Calcium: 9.5 mg/dL (ref 8.9–10.3)
GFR calc Af Amer: 58 mL/min — ABNORMAL LOW (ref >60)
GFR calc non Af Amer: 50 mL/min — ABNORMAL LOW (ref >60)
Glucose, Bld: 114 mg/dL — ABNORMAL HIGH (ref 65–99)
Potassium: 4.4 mmol/L (ref 3.5–5.1)
SODIUM: 142 mmol/L (ref 135–145)

## 2017-09-29 LAB — MAGNESIUM: Magnesium: 2.1 mg/dL (ref 1.7–2.4)

## 2017-09-29 MED ORDER — ZOLPIDEM TARTRATE 5 MG PO TABS
10.0000 mg | ORAL_TABLET | Freq: Every evening | ORAL | Status: DC | PRN
  Administered 2017-09-29: 10 mg via ORAL
  Filled 2017-09-29: qty 2

## 2017-09-29 MED ORDER — LOSARTAN POTASSIUM 50 MG PO TABS
50.0000 mg | ORAL_TABLET | Freq: Every day | ORAL | Status: DC
  Administered 2017-09-29: 50 mg via ORAL
  Filled 2017-09-29: qty 1

## 2017-09-29 MED ORDER — SPIRONOLACTONE 25 MG PO TABS
25.0000 mg | ORAL_TABLET | Freq: Every day | ORAL | Status: DC
  Administered 2017-09-29 – 2017-09-30 (×2): 25 mg via ORAL
  Filled 2017-09-29: qty 1

## 2017-09-29 NOTE — Progress Notes (Addendum)
   Electrophysiology Rounding Note  Patient Name: Stacey [name] Date of Encounter: 09/29/2017  Primary Cardiologist: Kirke Corin Electrophysiologist: Graciela Husbands   Subjective   The patient is doing well today.  At this time, the patient denies chest pain, shortness of breath, or any new concerns.  Inpatient Medications    Scheduled Meds: . carvedilol  12.5 mg Oral BID WC  . furosemide  40 mg Oral Daily  . losartan  25 mg Oral Daily  . magnesium oxide  400 mg Oral BID  . pantoprazole  80 mg Oral Q1200  . potassium chloride SA  20 mEq Oral Daily  . rivaroxaban  20 mg Oral Q supper  . sodium chloride flush  3 mL Intravenous Q12H  . spironolactone  12.5 mg Oral Daily  . venlafaxine  75 mg Oral BID   Continuous Infusions: . sodium chloride     PRN Meds: sodium chloride, diclofenac sodium, HYDROcodone-acetaminophen, sodium chloride flush, zolpidem   Vital Signs    Vitals:   09/28/17 1229 09/28/17 2031 09/29/17 0258 09/29/17 0335  BP: 129/61 (!) 141/71 (!) 176/98 127/70  Pulse: 77  85   Resp: 16  12 19   Temp: 98.5 F (36.9 C) 98.4 F (36.9 C) 97.8 F (36.6 C)   TempSrc: Oral Oral Oral   SpO2: 97% 99% 96%   Weight:      Height:        Intake/Output Summary (Last 24 hours) at 09/29/2017 0907 Last data filed at 09/29/2017 0258 Gross per 24 hour  Intake 600 ml  Output -  Net 600 ml   Filed Weights   09/26/17 1356 09/26/17 1950  Weight: 248 lb 10.9 oz (112.8 kg) 248 lb 9.6 oz (112.8 kg)    Physical Exam    GEN- The patient is well appearing, alert and oriented x 3 today.   Head- normocephalic, atraumatic Eyes-  Sclera clear, conjunctiva pink Ears- hearing intact Oropharynx- clear Neck- supple Lungs- Clear to ausculation bilaterally, normal work of breathing Heart- Regular rate and rhythm  GI- soft, NT, ND, + BS Extremities- no clubbing, cyanosis, or edema Skin- no rash or lesion Psych- euthymic mood, full affect Neuro- strength and sensation are intact  Labs      CBC No results for input(s): WBC, NEUTROABS, HGB, HCT, MCV, PLT in the last 72 hours. Basic Metabolic Panel Recent Labs    79/43/27 0351 09/29/17 0251  NA 140 142  K 3.9 4.4  CL 105 103  CO2 28 30  GLUCOSE 104* 114*  BUN 13 18  CREATININE 0.95 1.13*  CALCIUM 9.0 9.5  MG 2.0 2.1     Telemetry    SR, no further VT (personally reviewed)  Radiology    No results found.   Assessment & Plan    1.  Persistent atrial fibrillation Developed torsades with Tikosyn QT still slightly prolonged today on telemetry.  We will obtain an electrocardiogram Will watch today and start amiodarone tomorrow (200mg  twice daily) Continue Xarelto for CHADS2VASC of 3  2.  HTN  with blood pressure still elevated will increase losartan 25--50  3.  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  OSA Compliant with CPAP   Signed, Gypsy Balsam, NP  09/29/2017, 9:07 AM   Seen and examined in the note amended above

## 2017-09-29 NOTE — Care Management Important Message (Signed)
Important Message  Patient Details  Name: Stacey [name] MRN: [id number] Date of Birth: 05-Jul-1950   Medicare Important Message Given:  Yes    Fiorela Pelzer P Suman Trivedi 09/29/2017, 3:13 PM

## 2017-09-30 DIAGNOSIS — I4891 Unspecified atrial fibrillation: Secondary | ICD-10-CM

## 2017-09-30 LAB — HEPATIC FUNCTION PANEL
ALBUMIN: 3.1 g/dL — AB (ref 3.5–5.0)
ALK PHOS: 65 U/L (ref 38–126)
ALT: 16 U/L (ref 14–54)
AST: 12 U/L — ABNORMAL LOW (ref 15–41)
BILIRUBIN INDIRECT: 0.5 mg/dL (ref 0.3–0.9)
Bilirubin, Direct: 0.1 mg/dL (ref 0.1–0.5)
TOTAL PROTEIN: 6.6 g/dL (ref 6.5–8.1)
Total Bilirubin: 0.6 mg/dL (ref 0.3–1.2)

## 2017-09-30 LAB — TSH: TSH: 2.758 u[IU]/mL (ref 0.350–4.500)

## 2017-09-30 MED ORDER — SACUBITRIL-VALSARTAN 49-51 MG PO TABS
1.0000 | ORAL_TABLET | Freq: Two times a day (BID) | ORAL | Status: DC
  Administered 2017-09-30: 1 via ORAL
  Filled 2017-09-30 (×3): qty 1

## 2017-09-30 MED ORDER — SPIRONOLACTONE 25 MG PO TABS: 25.0000 mg | ORAL_TABLET | Freq: Every day | ORAL | 1 refills | Status: DC

## 2017-09-30 MED ORDER — AMIODARONE HCL 200 MG PO TABS: 200.0000 mg | ORAL_TABLET | Freq: Two times a day (BID) | ORAL | 1 refills | Status: DC

## 2017-09-30 MED ORDER — AMIODARONE HCL 200 MG PO TABS
200.0000 mg | ORAL_TABLET | Freq: Two times a day (BID) | ORAL | Status: DC
  Administered 2017-09-30: 200 mg via ORAL
  Filled 2017-09-30: qty 1

## 2017-09-30 MED ORDER — CARVEDILOL 12.5 MG PO TABS: 12.5000 mg | ORAL_TABLET | Freq: Two times a day (BID) | ORAL | 1 refills | Status: DC

## 2017-09-30 MED ORDER — SACUBITRIL-VALSARTAN 49-51 MG PO TABS: 1.0000 | ORAL_TABLET | Freq: Two times a day (BID) | ORAL | 1 refills | Status: DC

## 2017-09-30 NOTE — Progress Notes (Signed)
   Electrophysiology Rounding Note  Patient Name: Stacey [name] Date of Encounter: 09/30/2017  Primary Cardiologist: Kirke Corin Electrophysiologist: Graciela Husbands   Subjective  The patient denies chest pain, shortness of breath, nocturnal dyspnea, orthopnea or peripheral edema.  There have been no palpitations, lightheadedness or syncope.     Inpatient Medications    Scheduled Meds: . carvedilol  12.5 mg Oral BID WC  . furosemide  40 mg Oral Daily  . losartan  50 mg Oral Daily  . magnesium oxide  400 mg Oral BID  . pantoprazole  80 mg Oral Q1200  . rivaroxaban  20 mg Oral Q supper  . sodium chloride flush  3 mL Intravenous Q12H  . spironolactone  25 mg Oral Daily  . venlafaxine  75 mg Oral BID   Continuous Infusions: . sodium chloride     PRN Meds: sodium chloride, diclofenac sodium, HYDROcodone-acetaminophen, sodium chloride flush, zolpidem   Vital Signs    Vitals:   09/29/17 0335 09/29/17 1108 09/29/17 2000 09/30/17 0414  BP: 127/70 (!) 155/82 (!) 160/92 (!) 145/87  Pulse:  76 82 68  Resp: 19 16 17 16   Temp:   98.9 F (37.2 C) 98.7 F (37.1 C)  TempSrc:   Oral Oral  SpO2:  100% 98% 93%  Weight:      Height:        Intake/Output Summary (Last 24 hours) at 09/30/2017 0816 Last data filed at 09/29/2017 2001 Gross per 24 hour  Intake 720 ml  Output -  Net 720 ml   Filed Weights   09/26/17 1356 09/26/17 1950  Weight: 248 lb 10.9 oz (112.8 kg) 248 lb 9.6 oz (112.8 kg)    Physical Exam    Well developed and nourished in no acute distress HENT normal Neck supple with JVP-flat Clear Regular rate and rhythm, no murmurs or gallops Abd-soft with active BS No Clubbing cyanosis edema Skin-warm and dry A & Oriented  Grossly normal sensory and motor function   Labs    CBC No results for input(s): WBC, NEUTROABS, HGB, HCT, MCV, PLT in the last 72 hours. Basic Metabolic Panel Recent Labs    59/74/16 0351 09/29/17 0251  NA 140 142  K 3.9 4.4  CL 105 103  CO2 28 30    GLUCOSE 104* 114*  BUN 13 18  CREATININE 0.95 1.13*  CALCIUM 9.0 9.5  MG 2.0 2.1   ECG personally reviewed QT 440 msec<<  510   Telemetry    Sinus withoutfurther VT Personally reviewed    Radiology    No results found.   Assessment & Plan    1.  Persistent atrial fibrillation    2.  HTN    3.  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  OSA   BP still elevated  Will change losartan to entresto  Begin amio as QT normal  AFib clinic followup next 7-10 days   Discharge to home     Signed, Sherryl Manges, MD  09/30/2017, 8:16 AM

## 2017-09-30 NOTE — Progress Notes (Addendum)
Per insurance check on Entrestro  # 4.  S/W MICHELLE @ [facility] [employer] [facility] RX VALUE (PDP)       # [id number]    ENTRESTO 49-51 MG BID  COVER- YES  CO-PAY- $ 88-27  TIER- 3 DRUG  PRIOR APPROVAL- NO   NO DEDUCTIBLE   PREFERRED PHARMACY : YES - [facility] , [facility] AND [facility]   CM shared with pt at bedside coverage info.

## 2017-09-30 NOTE — Discharge Summary (Addendum)
ELECTROPHYSIOLOGY PROCEDURE DISCHARGE SUMMARY    Patient ID: Stacey [name],  MRN: [id number], DOB/AGE: 1951-02-14 67 y.o.  Admit date: 09/26/2017 Discharge date: 09/30/2017  Primary Care Physician: Marguarite Arbour, MD Primary Cardiologist: Kirke Corin Electrophysiologist: Graciela Husbands  Primary Discharge Diagnosis:  1.  Paroxysmal atrial fibrillation status post Tikosyn loading this admission  Secondary Discharge Diagnosis:  1.  Hypertension 2.  Presumed tachycardia mediated cardiomyopathy 3.  Obesity 4.  OSA on CPAP 5.  GERD  Allergies  Allergen Reactions  . Levaquin [Levofloxacin In D5w] Other (See Comments)    Severe dizziness   . Metoprolol Itching, Nausea And Vomiting, Rash and Other (See Comments)    Dizziness  . Tikosyn [Dofetilide] Other (See Comments)    Developed Torsades on Tikosyn 09/28/17  . Ace Inhibitors     Cough      Procedures This Admission:  1.  Tikosyn loading  Brief HPI: Stacey [name] is a 67 y.o. female with a past medical history as noted above.  They were referred to EP in the outpatient setting for treatment options of atrial fibrillation.  Risks, benefits, and alternatives to Tikosyn were reviewed with the patient who wished to proceed.    Hospital Course:  The patient was admitted and Tikosyn was initiated.  Renal function and electrolytes were followed during the hospitalization.  She developed non sustained Torsades and her Tikosyn was discontinued. Her QTc normalized and she was started on optimal heart failure medications. They were monitored until discharge on telemetry which demonstrated SR.  On the day of discharge, they were examined by Dr Graciela Husbands who considered them stable for discharge to home with plan for amiodarone and outpatient referral to Dr Johney Frame to consider ablation. She will follow up with Dr Graciela Husbands next week for EKG and labs.   Physical Exam: Vitals:   09/29/17 1108 09/29/17 2000 09/30/17 0414 09/30/17 0839  BP: (!) 155/82 (!)  160/92 (!) 145/87 138/63  Pulse: 76 82 68 81  Resp: 16 17 16    Temp:  98.9 F (37.2 C) 98.7 F (37.1 C)   TempSrc:  Oral Oral   SpO2: 100% 98% 93%   Weight:      Height:        Labs:   Lab Results  Component Value Date   WBC 6.5 07/01/2015   HGB 12.2 07/01/2015   HCT 38.2 07/01/2015   MCV 90 07/01/2015   PLT 241 07/01/2015    Recent Labs  Lab 09/29/17 0251 09/30/17 0357  NA 142  --   K 4.4  --   CL 103  --   CO2 30  --   BUN 18  --   CREATININE 1.13*  --   CALCIUM 9.5  --   PROT  --  6.6  BILITOT  --  0.6  ALKPHOS  --  65  ALT  --  16  AST  --  12*  GLUCOSE 114*  --      Discharge Medications:  Allergies as of 09/30/2017      Reactions   Levaquin [levofloxacin In D5w] Other (See Comments)   Severe dizziness   Metoprolol Itching, Nausea And Vomiting, Rash, Other (See Comments)   Dizziness   Tikosyn [dofetilide] Other (See Comments)   Developed Torsades on Tikosyn 09/28/17   Ace Inhibitors    Cough      Medication List    STOP taking these medications   diltiazem 120 MG 24 hr capsule Commonly known as:  CARDIZEM  CD   diltiazem 240 MG 24 hr capsule Commonly known as:  DILACOR XR     TAKE these medications   amiodarone 200 MG tablet Commonly known as:  PACERONE Take 1 tablet (200 mg total) by mouth 2 (two) times daily.   carvedilol 12.5 MG tablet Commonly known as:  COREG Take 1 tablet (12.5 mg total) by mouth 2 (two) times daily with a meal.   diclofenac sodium 1 % Gel Commonly known as:  VOLTAREN Apply 1 application topically 3 (three) times daily as needed (knee pain).   esomeprazole 40 MG capsule Commonly known as:  NEXIUM TAKE 1 CAPSULE BY MOUTH EVERY MORNING   furosemide 40 MG tablet Commonly known as:  LASIX TAKE 1 TABLET BY MOUTH ONCE DAILY   HYDROcodone-acetaminophen 10-325 MG tablet Commonly known as:  NORCO Take 1 tablet by mouth every 6 (six) hours as needed for moderate pain or severe pain.   magnesium oxide 400 MG  tablet Commonly known as:  MAG-OX Take 400 mg by mouth daily.   potassium chloride SA 20 MEQ tablet Commonly known as:  K-DUR,KLOR-CON Take 1 tablet (20 mEq total) by mouth daily.   rivaroxaban 20 MG Tabs tablet Commonly known as:  XARELTO take 1 tablet by mouth once daily WITH SUPPER   sacubitril-valsartan 49-51 MG Commonly known as:  ENTRESTO Take 1 tablet by mouth 2 (two) times daily.   spironolactone 25 MG tablet Commonly known as:  ALDACTONE Take 1 tablet (25 mg total) by mouth daily. Start taking on:  10/01/2017   venlafaxine 75 MG tablet Commonly known as:  EFFEXOR Take 75 mg by mouth 2 (two) times daily.   zolpidem 10 MG tablet Commonly known as:  AMBIEN Take 10 mg by mouth at bedtime.       Disposition:  Discharge Instructions    Diet - low sodium heart healthy   Complete by:  As directed    Increase activity slowly   Complete by:  As directed      Follow-up Information    Duke Salvia, MD Follow up on 10/06/2017.   Specialty:  Cardiology Why:  at 11:45AM Contact information: [address] [place] [address] [phone]           Duration of Discharge Encounter: Greater than 30 minutes including physician time.  Signed, Gypsy Balsam, NP 09/30/2017 9:20 AM  Patient seen and examined note edited.  We will continue amiodarone.  Hopefully her cardiomyopathy will resolve with maintenance of sinus rhythm.  Continue guideline directed medical therapy.  Will need a metabolic profile.

## 2017-10-06 ENCOUNTER — Ambulatory Visit (INDEPENDENT_AMBULATORY_CARE_PROVIDER_SITE_OTHER): Payer: Medicare Other | Admitting: Internal Medicine

## 2017-10-06 ENCOUNTER — Encounter: Payer: Self-pay | Admitting: Internal Medicine

## 2017-10-06 VITALS — BP 100/66 | HR 88 | Ht 63.0 in | Wt 235.0 lb

## 2017-10-06 DIAGNOSIS — I4891 Unspecified atrial fibrillation: Secondary | ICD-10-CM

## 2017-10-06 DIAGNOSIS — I5032 Chronic diastolic (congestive) heart failure: Secondary | ICD-10-CM

## 2017-10-06 DIAGNOSIS — R9431 Abnormal electrocardiogram [ECG] [EKG]: Secondary | ICD-10-CM

## 2017-10-06 DIAGNOSIS — T50905A Adverse effect of unspecified drugs, medicaments and biological substances, initial encounter: Secondary | ICD-10-CM

## 2017-10-06 DIAGNOSIS — I4581 Long QT syndrome: Secondary | ICD-10-CM

## 2017-10-06 DIAGNOSIS — I4721 Torsades de pointes: Secondary | ICD-10-CM

## 2017-10-06 DIAGNOSIS — I472 Ventricular tachycardia: Secondary | ICD-10-CM

## 2017-10-06 HISTORY — DX: Torsades de pointes: I47.21

## 2017-10-06 HISTORY — DX: Ventricular tachycardia: I47.2

## 2017-10-06 HISTORY — DX: Adverse effect of unspecified drugs, medicaments and biological substances, initial encounter: T50.905A

## 2017-10-06 MED ORDER — CARVEDILOL 6.25 MG PO TABS: 6.2500 mg | ORAL_TABLET | Freq: Two times a day (BID) | ORAL | 3 refills | Status: DC

## 2017-10-06 MED ORDER — SACUBITRIL-VALSARTAN 24-26 MG PO TABS: 1.0000 | ORAL_TABLET | Freq: Two times a day (BID) | ORAL | 3 refills | Status: DC

## 2017-10-06 MED ORDER — SPIRONOLACTONE 25 MG PO TABS: 12.5000 mg | ORAL_TABLET | Freq: Every day | ORAL | 2 refills | Status: DC

## 2017-10-06 NOTE — Progress Notes (Signed)
Patient Care Team: Marguarite Arbour, MD as PCP - General (Internal Medicine)   HPI  Stacey [name] is a 67 y.o. 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    Hospitalized 6/19 for initiation of dofetilide, reverted to sinus but developed TdP an dQT prolongation. It was washed out without recurrent TdP and amiodarone initiated    DATE TEST EF   7/15 LHC  55-60 % No obs CAD  5/19 Echo   20.25 %         Date Cr TSH  LFTs Hgb  6/19 1.13 2.75 16 13.1(5/19)           She has been having lightheadedness fatigue and some dizziness.  She is also having nausea associated with some anorexia.  Records and Results Reviewed outpt labs and notes   Past Medical History:  Diagnosis Date  . Anxiety   . Arthritis    "knees" (09/26/2017)  . Chronic diastolic CHF (congestive heart failure) (HCC)    a. Dx 08/2013 in setting of rapid afib;  b. 08/2013 Echo: EF 55-60%, mildly dil LA, nl RV.  Marland Kitchen Drug-induced torsades de pointes 10/06/2017  . GERD (gastroesophageal reflux disease)   . High cholesterol   . History of blood transfusion 1975   "related to ectopic pregnancy  . Hypertension   . Non-obstructive CAD    a. 10/2013 Myoview: anteroseptal, apical, septal mild ischemia, nl LV fxn;  b. Cath: LAD 3m LCX 20ost, RCA 90m.  . Obesity   . OSA on CPAP   . Paroxysmal atrial fibrillation (HCC)    a. Dx 08/2013. CHA2DS2VASc = 3 (diast chf, htn, female) -->Xarelto initiated; b. 09/2013 s/p DCCV;  c. 10/2013 recurrent afib->Flecainide added->S/P DCCV;  d. 11/2013 Recurrent afib while off of flecainide (2/2 abnl nuc study)-->Flecainide resumed after nl cath-->DCCV;  e. Recurrent Afib 01/2014-->Flecainide increased to 150mg  BID.  Marland Kitchen Pneumonia    "1-2 times" (09/26/2017)    Past Surgical History:  Procedure Laterality Date  . BREAST BIOPSY Right 2007   stereo-neg  . CARDIAC CATHETERIZATION  10/2013   [facility]  . CARDIOVERSION N/A 08/29/2017   Procedure:  CARDIOVERSION;  Surgeon: Iran Ouch, MD;  Location: [facility] ORS;  Service: Cardiovascular;  Laterality: N/A;  . CARDIOVERSION     "I've had a total of 4" (09/26/2017)  . ECTOPIC PREGNANCY SURGERY  1975  . TONSILLECTOMY      Current Meds  Medication Sig  . amiodarone (PACERONE) 200 MG tablet Take 1 tablet (200 mg total) by mouth 2 (two) times daily.  . carvedilol (COREG) 12.5 MG tablet Take 1 tablet (12.5 mg total) by mouth 2 (two) times daily with a meal.  . diclofenac sodium (VOLTAREN) 1 % GEL Apply 1 application topically 3 (three) times daily as needed (knee pain).  Marland Kitchen esomeprazole (NEXIUM) 40 MG capsule TAKE 1 CAPSULE BY MOUTH EVERY MORNING  . furosemide (LASIX) 40 MG tablet TAKE 1 TABLET BY MOUTH ONCE DAILY  . HYDROcodone-acetaminophen (NORCO) 10-325 MG tablet Take 1 tablet by mouth every 6 (six) hours as needed for moderate pain or severe pain.   . magnesium oxide (MAG-OX) 400 MG tablet Take 400 mg by mouth daily.  . potassium chloride SA (K-DUR,KLOR-CON) 20 MEQ tablet Take 1 tablet (20 mEq total) by mouth daily.  . rivaroxaban (XARELTO) 20 MG TABS tablet take 1 tablet by mouth once daily WITH SUPPER  . sacubitril-valsartan (ENTRESTO) 49-51 MG Take 1 tablet  by mouth 2 (two) times daily.  Marland Kitchen spironolactone (ALDACTONE) 25 MG tablet Take 1 tablet (25 mg total) by mouth daily.  Marland Kitchen venlafaxine (EFFEXOR) 75 MG tablet Take 75 mg by mouth 2 (two) times daily.  Marland Kitchen zolpidem (AMBIEN) 10 MG tablet Take 10 mg by mouth at bedtime.     Allergies  Allergen Reactions  . Levaquin [Levofloxacin In D5w] Other (See Comments)    Severe dizziness   . Metoprolol Itching, Nausea And Vomiting, Rash and Other (See Comments)    Dizziness  . Tikosyn [Dofetilide] Other (See Comments)    Developed Torsades on Tikosyn 09/28/17  . Ace Inhibitors     Cough       Review of Systems negative except from HPI and PMH  Physical Exam BP 100/66 (BP Location: Left Arm, Patient Position: Sitting, Cuff Size: Large)    Pulse 88   Ht 5\' 3"  (1.6 m)   Wt 235 lb (106.6 kg)   BMI 41.63 kg/m  Well developed and well nourished in no acute distress HENT normal E scleral and icterus clear Neck Supple JVP flat; carotids brisk and full Clear to ausculation Regular rate and rhythm, no murmurs gallops or rub Soft with active bowel sounds No clubbing cyanosis  Edema Alert and oriented, grossly normal motor and sensory function Skin Warm and Dry  ECG demonstrates sinus rhythm at 88 Interval 15/07/42   Assessment and  Plan  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  Some of her symptoms may be related to drug effects; we will decrease her Entresto, carvedilol and spironolactone with the intention of gradually re-up titration  Her nausea may be related to amiodarone.  We will plan to have her decrease from 200 twice daily--daily without 2 weeks.  Hopefully the nausea will abate.  The amiodarone is being used as a bridge to we will consider ablation of ablation in this young lady for whom other drug options are not available  We will arrange consultation with Dr. Johney Frame.  I will see her in about 6-8 weeks with a repeat echo to look at LV function  On Anticoagulation;  No bleeding issues      Current medicines are reviewed at length with the patient today .  The patient does not  have concerns regarding medicines.

## 2017-10-06 NOTE — Patient Instructions (Addendum)
Medication Instructions:  Your physician has recommended you make the following change in your medication:  1- DECREASE Carvedilol to 6.25mg  (1/2 tablet) by mouth two times a day. 2- DECREASE Entresto to 24-26 mg (1 tablet) by mouth two times a day. (You will need to pick up this new prescription.) 3- DECREASE Spironolactone to 12.5 mg (1/2 tablet) by mouth once a day.   Labwork: Your physician recommends that you return for lab work in: 1 WEEK FOR BMET. You will be provided a prescription with the order. Please have them fax results to [phone].   Testing/Procedures: Your physician has requested that you have an echocardiogram. Echocardiography is a painless test that uses sound waves to create images of your heart. It provides your doctor with information about the size and shape of your heart and how well your heart's chambers and valves are working. This procedure takes approximately one hour. There are no restrictions for this procedure. - IN 2 MONTHS PRIOR TO appointment WITH DR Graciela Husbands. - You may get an IV, if needed, to receive an ultrasound enhancing agent through to better visualize your heart.    Follow-Up: Your physician recommends that you schedule a follow-up appointment in: ON 10/10/17 AT 3:30 PM WITH DR ALLRED [facility]. - The June code is 1300. -  Call [phone], if any further questions regarding this appointment.  Your physician recommends that you schedule a follow-up appointment in: 2 MONTHS WITH DR Graciela Husbands. MAKE SURE ECHO IS DONE BEFORE.  If you need a refill on your cardiac medications before your next appointment, please call your pharmacy.    Echocardiogram An echocardiogram, or echocardiography, uses sound waves (ultrasound) to produce an image of your heart. The echocardiogram is simple, painless, obtained within a short period of time, and offers valuable information to your health care provider. The images from an echocardiogram can provide  information such as:  Evidence of coronary artery disease (CAD).  Heart size.  Heart muscle function.  Heart valve function.  Aneurysm detection.  Evidence of a past heart attack.  Fluid buildup around the heart.  Heart muscle thickening.  Assess heart valve function.  Tell a health care provider about:  Any allergies you have.  All medicines you are taking, including vitamins, herbs, eye drops, creams, and over-the-counter medicines.  Any problems you or family members have had with anesthetic medicines.  Any blood disorders you have.  Any surgeries you have had.  Any medical conditions you have.  Whether you are pregnant or may be pregnant. What happens before the procedure? No special preparation is needed. Eat and drink normally. What happens during the procedure?  In order to produce an image of your heart, gel will be applied to your chest and a wand-like tool (transducer) will be moved over your chest. The gel will help transmit the sound waves from the transducer. The sound waves will harmlessly bounce off your heart to allow the heart images to be captured in real-time motion. These images will then be recorded.  You may need an IV to receive a medicine that improves the quality of the pictures. What happens after the procedure? You may return to your normal schedule including diet, activities, and medicines, unless your health care provider tells you otherwise. This information is not intended to replace advice given to you by your health care provider. Make sure you discuss any questions you have with your health care provider. Document Released: 04/09/2000 Document Revised: 11/29/2015 Document Reviewed: 12/18/2012  Elsevier Interactive Patient Education  2017 Elsevier Inc[employer]  

## 2017-10-10 ENCOUNTER — Ambulatory Visit (HOSPITAL_COMMUNITY)
Admission: RE | Admit: 2017-10-10 | Discharge: 2017-10-10 | Disposition: A | Discharge status: Home / Self Care | Payer: Medicare Other | Source: Ambulatory Visit | Attending: Nurse Practitioner | Admitting: Nurse Practitioner

## 2017-10-10 VITALS — BP 102/64 | HR 100 | Ht 63.0 in | Wt 237.0 lb

## 2017-10-10 DIAGNOSIS — M1990 Unspecified osteoarthritis, unspecified site: Secondary | ICD-10-CM | POA: Insufficient documentation

## 2017-10-10 DIAGNOSIS — I5032 Chronic diastolic (congestive) heart failure: Secondary | ICD-10-CM | POA: Diagnosis not present

## 2017-10-10 DIAGNOSIS — Z79899 Other long term (current) drug therapy: Secondary | ICD-10-CM | POA: Diagnosis not present

## 2017-10-10 DIAGNOSIS — I519 Heart disease, unspecified: Secondary | ICD-10-CM | POA: Diagnosis not present

## 2017-10-10 DIAGNOSIS — I2510 Atherosclerotic heart disease of native coronary artery without angina pectoris: Secondary | ICD-10-CM | POA: Insufficient documentation

## 2017-10-10 DIAGNOSIS — K219 Gastro-esophageal reflux disease without esophagitis: Secondary | ICD-10-CM | POA: Diagnosis not present

## 2017-10-10 DIAGNOSIS — Z881 Allergy status to other antibiotic agents status: Secondary | ICD-10-CM | POA: Diagnosis not present

## 2017-10-10 DIAGNOSIS — Z6841 Body Mass Index (BMI) 40.0 and over, adult: Secondary | ICD-10-CM | POA: Insufficient documentation

## 2017-10-10 DIAGNOSIS — R000 Tachycardia, unspecified: Secondary | ICD-10-CM | POA: Insufficient documentation

## 2017-10-10 DIAGNOSIS — Z803 Family history of malignant neoplasm of breast: Secondary | ICD-10-CM | POA: Diagnosis not present

## 2017-10-10 DIAGNOSIS — I110 Hypertensive heart disease with heart failure: Secondary | ICD-10-CM | POA: Insufficient documentation

## 2017-10-10 DIAGNOSIS — I481 Persistent atrial fibrillation: Secondary | ICD-10-CM | POA: Diagnosis not present

## 2017-10-10 DIAGNOSIS — E7800 Pure hypercholesterolemia, unspecified: Secondary | ICD-10-CM | POA: Diagnosis not present

## 2017-10-10 DIAGNOSIS — I428 Other cardiomyopathies: Secondary | ICD-10-CM | POA: Insufficient documentation

## 2017-10-10 DIAGNOSIS — G4733 Obstructive sleep apnea (adult) (pediatric): Secondary | ICD-10-CM | POA: Diagnosis not present

## 2017-10-10 DIAGNOSIS — Z888 Allergy status to other drugs, medicaments and biological substances status: Secondary | ICD-10-CM | POA: Insufficient documentation

## 2017-10-10 DIAGNOSIS — Z7901 Long term (current) use of anticoagulants: Secondary | ICD-10-CM | POA: Diagnosis not present

## 2017-10-10 DIAGNOSIS — Z8249 Family history of ischemic heart disease and other diseases of the circulatory system: Secondary | ICD-10-CM | POA: Diagnosis not present

## 2017-10-10 DIAGNOSIS — I4819 Other persistent atrial fibrillation: Secondary | ICD-10-CM

## 2017-10-10 MED ORDER — AMIODARONE HCL 200 MG PO TABS: ORAL_TABLET | ORAL | 1 refills | Status: DC

## 2017-10-10 NOTE — Progress Notes (Signed)
Primary Care Physician: Marguarite Arbour, MD Primary Cardiologist: Dr Kirke Corin Primary Electrophysiologist: Dr Graciela Husbands Referring Physician: Dr Angelina Ok is a 67 y.o. female with a history of persistent atrial fibrillation who presents for consultation in the [facility].  The patient was initially diagnosed with atrial fibrillation 08/2013. She was placed on flecainide 150mg  BID and did well for several years.  Over the past few months, she has had refractory afib.  She had recurrence of afib after cardioversion in May.  She was subsequently placed on tikosyn in early June but had Torsades and therefore was taken off of tikosyn.  She was placed on amiodarone with plans to follow-up in the AF clinic today. She remains in sinus rhythm.  She is currently doing well. Today, she denies symptoms of palpitations, chest pain, shortness of breath, orthopnea, PND, lower extremity edema, dizziness, presyncope, syncope, snoring, daytime somnolence, bleeding, or neurologic sequela. The patient is tolerating medications without difficulties and is otherwise without complaint today.    Atrial Fibrillation Risk Factors:  she does have symptoms or diagnosis of sleep apnea. she is compliant with CPAP therapy.  she does not have a history of rheumatic fever.  she does not have a history of alcohol use.  she has a BMI of Body mass index is 41.98 kg/m.Marland Kitchen Filed Weights   10/10/17 1535  Weight: 237 lb (107.5 kg)    LA size: 74 ml   Atrial Fibrillation Management history:  Previous antiarrhythmic drugs: flecainide (ineffective after 3 years), tikosyn (torsades), amiodarone  Previous cardioversions: multiple prior  Previous ablations: no  CHADS2VASC score: 4  Anticoagulation history: on xarelto   Past Medical History:  Diagnosis Date  . Anxiety   . Arthritis    "knees" (09/26/2017)  . Chronic diastolic CHF (congestive heart failure) (HCC)    a. Dx 08/2013  in setting of rapid afib;  b. 08/2013 Echo: EF 55-60%, mildly dil LA, nl RV.  Marland Kitchen Drug-induced torsades de pointes 10/06/2017  . GERD (gastroesophageal reflux disease)   . High cholesterol   . History of blood transfusion 1975   "related to ectopic pregnancy  . Hypertension   . Non-obstructive CAD    a. 10/2013 Myoview: anteroseptal, apical, septal mild ischemia, nl LV fxn;  b. Cath: LAD 81m LCX 20ost, RCA 74m.  . Obesity   . OSA on CPAP   . Paroxysmal atrial fibrillation (HCC)    a. Dx 08/2013. CHA2DS2VASc = 3 (diast chf, htn, female) -->Xarelto initiated; b. 09/2013 s/p DCCV;  c. 10/2013 recurrent afib->Flecainide added->S/P DCCV;  d. 11/2013 Recurrent afib while off of flecainide (2/2 abnl nuc study)-->Flecainide resumed after nl cath-->DCCV;  e. Recurrent Afib 01/2014-->Flecainide increased to 150mg  BID.  Marland Kitchen Pneumonia    "1-2 times" (09/26/2017)   Past Surgical History:  Procedure Laterality Date  . BREAST BIOPSY Right 2007   stereo-neg  . CARDIAC CATHETERIZATION  10/2013   [facility]  . CARDIOVERSION N/A 08/29/2017   Procedure: CARDIOVERSION;  Surgeon: Iran Ouch, MD;  Location: [facility] ORS;  Service: Cardiovascular;  Laterality: N/A;  . CARDIOVERSION     "I've had a total of 4" (09/26/2017)  . ECTOPIC PREGNANCY SURGERY  1975  . TONSILLECTOMY      Current Outpatient Medications  Medication Sig Dispense Refill  . amiodarone (PACERONE) 200 MG tablet Take 1 tablet (200 mg total) by mouth 2 (two) times daily. 60 tablet 1  . carvedilol (COREG) 6.25 MG tablet Take 1 tablet (6.25  mg total) by mouth 2 (two) times daily. 180 tablet 3  . diclofenac sodium (VOLTAREN) 1 % GEL Apply 1 application topically 3 (three) times daily as needed (knee pain).    Marland Kitchen esomeprazole (NEXIUM) 40 MG capsule TAKE 1 CAPSULE BY MOUTH EVERY MORNING 30 capsule 0  . furosemide (LASIX) 40 MG tablet TAKE 1 TABLET BY MOUTH ONCE DAILY 30 tablet 0  . HYDROcodone-acetaminophen (NORCO) 10-325 MG tablet Take 1 tablet by mouth every 6  (six) hours as needed for moderate pain or severe pain.   0  . magnesium oxide (MAG-OX) 400 MG tablet Take 400 mg by mouth daily.    . potassium chloride SA (K-DUR,KLOR-CON) 20 MEQ tablet Take 1 tablet (20 mEq total) by mouth daily. 90 tablet 3  . rivaroxaban (XARELTO) 20 MG TABS tablet take 1 tablet by mouth once daily WITH SUPPER 30 tablet 3  . sacubitril-valsartan (ENTRESTO) 24-26 MG Take 1 tablet by mouth 2 (two) times daily. 180 tablet 3  . spironolactone (ALDACTONE) 25 MG tablet Take 0.5 tablets (12.5 mg total) by mouth daily. 45 tablet 2  . venlafaxine (EFFEXOR) 75 MG tablet Take 75 mg by mouth 2 (two) times daily.    Marland Kitchen zolpidem (AMBIEN) 10 MG tablet Take 10 mg by mouth at bedtime.      No current facility-administered medications for this encounter.     Allergies  Allergen Reactions  . Levaquin [Levofloxacin In D5w] Other (See Comments)    Severe dizziness   . Metoprolol Itching, Nausea And Vomiting, Rash and Other (See Comments)    Dizziness  . Tikosyn [Dofetilide] Other (See Comments)    Developed Torsades on Tikosyn 09/28/17  . Ace Inhibitors     Cough     Social History   Socioeconomic History  . Marital status: Married    Spouse name: Not on file  . Number of children: Not on file  . Years of education: Not on file  . Highest education level: Not on file  Occupational History  . Not on file  Social Needs  . Financial resource strain: Not on file  . Food insecurity:    Worry: Not on file    Inability: Not on file  . Transportation needs:    Medical: Not on file    Non-medical: Not on file  Tobacco Use  . Smoking status: Never Smoker  . Smokeless tobacco: Never Used  Substance and Sexual Activity  . Alcohol use: Yes    Alcohol/week: 4.2 oz    Types: 7 Glasses of wine per week    Comment: 1 glass of wine/day.  . Drug use: Never  . Sexual activity: Not Currently  Lifestyle  . Physical activity:    Days per week: Not on file    Minutes per session: Not  on file  . Stress: Not on file  Relationships  . Social connections:    Talks on phone: Not on file    Gets together: Not on file    Attends religious service: Not on file    Active member of club or organization: Not on file    Attends meetings of clubs or organizations: Not on file    Relationship status: Not on file  . Intimate partner violence:    Fear of current or ex partner: Not on file    Emotionally abused: Not on file    Physically abused: Not on file    Forced sexual activity: Not on file  Other Topics Concern  .  Not on file  Social History Narrative   Lives in [place].  Works as Water quality scientist.     Family History  Problem Relation Age of Onset  . CAD Father        s/p cabg - ? h/o afib.  Marland Kitchen Heart attack Father   . Hypertension Mother   . Breast cancer Mother 68   The patient does not have a history of early familial atrial fibrillation or other arrhythmias.  ROS- All systems are reviewed and negative except as per the HPI above.  Physical Exam: Vitals:   10/10/17 1535  BP: 102/64  Pulse: 100  Weight: 237 lb (107.5 kg)  Height: 5\' 3"  (1.6 m)    GEN- The patient is well appearing, alert and oriented x 3 today.   Head- normocephalic, atraumatic Eyes-  Sclera clear, conjunctiva pink Ears- hearing intact Oropharynx- clear Neck- supple  Lungs- Clear to ausculation bilaterally, normal work of breathing Heart- Regular rate and rhythm, no murmurs, rubs or gallops  GI- soft, NT, ND, + BS Extremities- no clubbing, cyanosis, or edema MS- no significant deformity or atrophy Skin- no rash or lesion Psych- euthymic mood, full affect Neuro- strength and sensation are intact  Wt Readings from Last 3 Encounters:  10/10/17 237 lb (107.5 kg)  10/06/17 235 lb (106.6 kg)  09/26/17 248 lb 9.6 oz (112.8 kg)    EKG today demonstrates sinus rhythm 100 bpm, LAD, QTc 443 msec Echo 09/13/17 demonstrated EF 20%, mild MR, mild LA enlargement  Epic records are reviewed  at length today  Assessment and Plan:  1. Persistent atrial fibrillation The patient has Asymptomatic persistent atrial fibrillation.  The patients CHAD2VASC score is 4.  she is  appropriately anticoagulated at this time. She has tachycardia mediated cardiomyopathy and rate control of her afib has been difficult.  The patients left atrial size is 74 ml.  Additional echo findings include EF 20%. A long discussion with the patient was had today regarding therapeutic strategies.  Extensive discussion of lifestyle modification including begin progressive daily aerobic exercise program and attempt to lose weight was also discussed.  Given her refractory afib and reduced EF, I agree with Dr Graciela Husbands that ablation is a good option for her.  Risk, benefits, and alternatives to EP study and radiofrequency ablation for afib were also discussed in detail today. These risks include but are not limited to stroke, bleeding, vascular damage, tamponade, perforation, damage to the esophagus, lungs, and other structures, pulmonary vein stenosis, worsening renal function, and death. The patient understands these risk and wishes to think about this further.  She would like to follow-up with Dr Graciela Husbands in August.  She may be willing to consider ablation after further discussion with him. I have advised that she reduce her amiodarone to 200mg  daily 10/24/17.  If she decides to proceed with ablation sooner then she will contact my office.  2. Morbid obesity As above, lifestyle modification was discussed at length including regular exercise and weight reduction.  She has been making good progress with this,.  3. Obstructive sleep apnea The importance of adequate treatment of sleep apnea was discussed today in order to improve our ability to maintain sinus rhythm long term.  4. Nonischemic CM Given castle AF study results, I am optimistic that ablation would be beneficial for her.  Follow-up with Dr Graciela Husbands as scheduled in  August. If she decides to proceed with ablation, she can contact our office and we will proceed. I would  advise cardiac CT prior to ablation.   Hillis Range, MD 10/10/2017 3:55 PM

## 2017-10-10 NOTE — Patient Instructions (Signed)
On July 1st reduce amiodarone to 200mg  once a day

## 2017-10-16 ENCOUNTER — Other Ambulatory Visit: Payer: Self-pay | Admitting: Cardiovascular Disease

## 2017-10-17 NOTE — Telephone Encounter (Signed)
Refill Request.  

## 2017-10-31 ENCOUNTER — Ambulatory Visit: Payer: Medicare Other | Admitting: Internal Medicine

## 2017-11-01 ENCOUNTER — Other Ambulatory Visit: Payer: Self-pay | Admitting: Cardiovascular Disease

## 2017-11-06 ENCOUNTER — Other Ambulatory Visit: Payer: Self-pay | Admitting: Cardiovascular Disease

## 2017-11-15 ENCOUNTER — Other Ambulatory Visit: Payer: Self-pay | Admitting: Cardiovascular Disease

## 2017-11-15 NOTE — Telephone Encounter (Signed)
Please review for refill, Thanks !  

## 2017-12-06 ENCOUNTER — Ambulatory Visit (INDEPENDENT_AMBULATORY_CARE_PROVIDER_SITE_OTHER): Payer: Medicare Other

## 2017-12-06 ENCOUNTER — Other Ambulatory Visit: Payer: Self-pay

## 2017-12-06 DIAGNOSIS — I5032 Chronic diastolic (congestive) heart failure: Secondary | ICD-10-CM | POA: Diagnosis not present

## 2017-12-06 DIAGNOSIS — I4581 Long QT syndrome: Secondary | ICD-10-CM | POA: Diagnosis not present

## 2017-12-06 DIAGNOSIS — T50905A Adverse effect of unspecified drugs, medicaments and biological substances, initial encounter: Secondary | ICD-10-CM

## 2017-12-06 DIAGNOSIS — I4891 Unspecified atrial fibrillation: Secondary | ICD-10-CM

## 2017-12-06 DIAGNOSIS — R9431 Abnormal electrocardiogram [ECG] [EKG]: Secondary | ICD-10-CM

## 2017-12-06 MED ORDER — PERFLUTREN LIPID MICROSPHERE
1.0000 mL | INTRAVENOUS | Status: AC | PRN
  Administered 2017-12-06: 2 mL via INTRAVENOUS

## 2017-12-14 ENCOUNTER — Other Ambulatory Visit: Payer: Self-pay | Admitting: Cardiovascular Disease

## 2017-12-14 NOTE — Telephone Encounter (Signed)
Refill Request.  

## 2017-12-15 ENCOUNTER — Other Ambulatory Visit: Payer: Self-pay | Admitting: Cardiovascular Disease

## 2017-12-15 ENCOUNTER — Encounter: Payer: Self-pay | Admitting: Internal Medicine

## 2017-12-15 ENCOUNTER — Ambulatory Visit (INDEPENDENT_AMBULATORY_CARE_PROVIDER_SITE_OTHER): Payer: Medicare Other | Admitting: Internal Medicine

## 2017-12-15 VITALS — BP 118/60 | HR 77 | Ht 63.0 in | Wt 236.0 lb

## 2017-12-15 DIAGNOSIS — I481 Persistent atrial fibrillation: Secondary | ICD-10-CM

## 2017-12-15 DIAGNOSIS — I428 Other cardiomyopathies: Secondary | ICD-10-CM | POA: Diagnosis not present

## 2017-12-15 DIAGNOSIS — Z79899 Other long term (current) drug therapy: Secondary | ICD-10-CM

## 2017-12-15 DIAGNOSIS — I4819 Other persistent atrial fibrillation: Secondary | ICD-10-CM

## 2017-12-15 NOTE — Progress Notes (Signed)
Patient Care Team: Marguarite Arbour, MD as PCP - General (Internal Medicine)   HPI  Stacey [name] is a 67 y.o. 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    Hospitalized 6/19 for initiation of dofetilide, reverted to sinus but developed TdP an dQT prolongation. It was washed out without recurrent TdP and amiodarone initiated   She saw Dr. Fawn Kirk 6/19 for consideration of catheter ablation and at that point elected to continue amiodarone.  She is feeling much better.  With less shortness of breath.  No edema.   Still little bit of nausea with the amiodarone; no cough or sun intolerance  DATE TEST EF   7/15 LHC  55-60 % No obs CAD  5/19 Echo   20.25 %   8/19 Echo  50-55%    Date Cr TSH  LFTs Hgb  6/19 1.13 2.75 16 13.1(5/19)   8/19           Records and Results Reviewed outpt labs and notes   Past Medical History:  Diagnosis Date  . Anxiety   . Arthritis    "knees" (09/26/2017)  . Chronic diastolic CHF (congestive heart failure) (HCC)    a. Dx 08/2013 in setting of rapid afib;  b. 08/2013 Echo: EF 55-60%, mildly dil LA, nl RV.  Marland Kitchen Drug-induced torsades de pointes 10/06/2017  . GERD (gastroesophageal reflux disease)   . High cholesterol   . History of blood transfusion 1975   "related to ectopic pregnancy  . Hypertension   . Non-obstructive CAD    a. 10/2013 Myoview: anteroseptal, apical, septal mild ischemia, nl LV fxn;  b. Cath: LAD 70m LCX 20ost, RCA 75m.  . Obesity   . OSA on CPAP   . Paroxysmal atrial fibrillation (HCC)    a. Dx 08/2013. CHA2DS2VASc = 3 (diast chf, htn, female) -->Xarelto initiated; b. 09/2013 s/p DCCV;  c. 10/2013 recurrent afib->Flecainide added->S/P DCCV;  d. 11/2013 Recurrent afib while off of flecainide (2/2 abnl nuc study)-->Flecainide resumed after nl cath-->DCCV;  e. Recurrent Afib 01/2014-->Flecainide increased to 150mg  BID.  Marland Kitchen Pneumonia    "1-2 times" (09/26/2017)    Past Surgical History:    Procedure Laterality Date  . BREAST BIOPSY Right 2007   stereo-neg  . CARDIAC CATHETERIZATION  10/2013   [facility]  . CARDIOVERSION N/A 08/29/2017   Procedure: CARDIOVERSION;  Surgeon: Iran Ouch, MD;  Location: [facility] ORS;  Service: Cardiovascular;  Laterality: N/A;  . CARDIOVERSION     "I've had a total of 4" (09/26/2017)  . ECTOPIC PREGNANCY SURGERY  1975  . TONSILLECTOMY      Current Meds  Medication Sig  . amiodarone (PACERONE) 200 MG tablet Take 1 tablet twice a day until July 1st then reduce to 1 tablet daily  . carvedilol (COREG) 6.25 MG tablet Take 1 tablet (6.25 mg total) by mouth 2 (two) times daily.  . diclofenac sodium (VOLTAREN) 1 % GEL Apply 1 application topically 3 (three) times daily as needed (knee pain).  Marland Kitchen esomeprazole (NEXIUM) 40 MG capsule TAKE 1 CAPSULE BY MOUTH EVERY MORNING  . furosemide (LASIX) 40 MG tablet TAKE 1 TABLET BY MOUTH ONCE DAILY  . HYDROcodone-acetaminophen (NORCO) 10-325 MG tablet Take 1 tablet by mouth every 6 (six) hours as needed for moderate pain or severe pain.   . magnesium oxide (MAG-OX) 400 MG tablet Take 400 mg by mouth daily.  . sacubitril-valsartan (ENTRESTO) 24-26 MG Take 1 tablet by mouth  2 (two) times daily.  Marland Kitchen spironolactone (ALDACTONE) 25 MG tablet Take 0.5 tablets (12.5 mg total) by mouth daily.  Marland Kitchen venlafaxine (EFFEXOR) 75 MG tablet Take 75 mg by mouth 2 (two) times daily.  Carlena Hurl 20 MG TABS tablet TAKE 1 TABLET BY MOUTH EVERY DAY WITH SUPPER  . zolpidem (AMBIEN) 10 MG tablet Take 10 mg by mouth at bedtime.     Allergies  Allergen Reactions  . Levaquin [Levofloxacin In D5w] Other (See Comments)    Severe dizziness   . Metoprolol Itching, Nausea And Vomiting, Rash and Other (See Comments)    Dizziness  . Tikosyn [Dofetilide] Other (See Comments)    Developed Torsades on Tikosyn 09/28/17  . Ace Inhibitors     Cough       Review of Systems negative except from HPI and PMH  Physical Exam BP 118/60 (BP Location: Left  Arm, Patient Position: Sitting, Cuff Size: Large)   Pulse 77   Ht 5\' 3"  (1.6 m)   Wt 236 lb (107 kg)   BMI 41.81 kg/m  Well developed and nourished in no acute distress HENT normal Neck supple with JVP-flat Clear Regular rate and rhythm, no murmurs or gallops Abd-soft with active BS No Clubbing cyanosis edema Skin-warm and dry A & Oriented  Grossly normal sensory and motor function  ECG demonstrates sinus at 77 Intervals 14/09/44  Assessment and  Plan  Cardiomyopathy-presumed nonischemic-rate related interval near normalization  CHF chronic systolic>> diastolic  Atrial fibrillation-persistent  Anorexia/nausea  Treated sleep apnea  Amiodarone therapy  She continues to do well in sinus rhythm.  She would like to pursue a course of intermittent cardioversion as opposed to ablation at this time.  We will check her amiodarone surveillance laboratories.  On Anticoagulation;  No bleeding issues   Euvolemic continue current meds     Current medicines are reviewed at length with the patient today .  The patient does not  have concerns regarding medicines.

## 2017-12-15 NOTE — Patient Instructions (Signed)
Medication Instructions: - Your physician has recommended you make the following change in your medication:   1) STOP aldactone (spironolactone)  Labwork: - Your physician recommends that you have lab work today: TSH/ Liver   Procedures/Testing: - none ordered  Follow-Up: - Your physician recommends that you schedule a follow-up appointment in: 3 months with Dr. Graciela Husbands.   Any Additional Special Instructions Will Be Listed Below (If Applicable).     If you need a refill on your cardiac medications before your next appointment, please call your pharmacy.

## 2017-12-16 LAB — HEPATIC FUNCTION PANEL
ALK PHOS: 69 IU/L (ref 39–117)
ALT: 14 IU/L (ref 0–32)
AST: 9 IU/L (ref 0–40)
Albumin: 4.4 g/dL (ref 3.6–4.8)
BILIRUBIN, DIRECT: 0.13 mg/dL (ref 0.00–0.40)
Bilirubin Total: 0.5 mg/dL (ref 0.0–1.2)
Total Protein: 7 g/dL (ref 6.0–8.5)

## 2017-12-16 LAB — TSH: TSH: 7.39 u[IU]/mL — ABNORMAL HIGH (ref 0.450–4.500)

## 2017-12-23 ENCOUNTER — Other Ambulatory Visit: Payer: Self-pay | Admitting: Nurse Practitioner

## 2017-12-23 NOTE — Telephone Encounter (Signed)
Dr. Kirke Corin pt

## 2017-12-27 ENCOUNTER — Telehealth: Payer: Self-pay | Admitting: Internal Medicine

## 2017-12-27 DIAGNOSIS — E039 Hypothyroidism, unspecified: Secondary | ICD-10-CM

## 2017-12-27 DIAGNOSIS — Z79899 Other long term (current) drug therapy: Secondary | ICD-10-CM

## 2017-12-27 NOTE — Telephone Encounter (Signed)
Notes recorded by Jefferey Pica, RN on 12/27/2017 at 4:25 PM EDT The patient is aware of her results. She is agreeable with having a repeat TSH in about 4 weeks.  She would prefer to come to the office to have this done. She is aware I will have scheduling call her to arrange. ------  Notes recorded by Duke Salvia, MD on 12/23/2017 at 6:27 PM EDT Please inform patient that drug surveillance labs are normal x TSH is now elevated and this is likely 2/2 amiodarone  Lets recheck in 4 weeks but suspect she will need replacement   ------  Notes recorded by Sigurd Sos, RN on 12/16/2017 at 4:01 PM EDT Preliminarily reviewed. Forwarded to MD desktop for review and signature.

## 2017-12-28 ENCOUNTER — Telehealth: Payer: Self-pay

## 2017-12-28 NOTE — Telephone Encounter (Signed)
-----   Message from Jefferey Pica, RN sent at 12/27/2017  4:27 PM EDT ----- Please call to arrange for a repeat TSH to be drawn in 4 weeks. The patient is aware. She prefers to come to the office to have this done.  Order placed.

## 2017-12-28 NOTE — Telephone Encounter (Signed)
L mom to call and schedule 4 week TSH lab appointment

## 2017-12-29 NOTE — Telephone Encounter (Signed)
Patient scheduled for 01/19/18   Nothing else needed.

## 2018-01-16 ENCOUNTER — Other Ambulatory Visit: Payer: Self-pay | Admitting: Nurse Practitioner

## 2018-01-16 NOTE — Telephone Encounter (Signed)
This is a [facility] pt 

## 2018-01-19 ENCOUNTER — Other Ambulatory Visit: Payer: Medicare Other

## 2018-01-24 ENCOUNTER — Other Ambulatory Visit (INDEPENDENT_AMBULATORY_CARE_PROVIDER_SITE_OTHER): Payer: Medicare Other

## 2018-01-24 DIAGNOSIS — Z79899 Other long term (current) drug therapy: Secondary | ICD-10-CM

## 2018-01-24 DIAGNOSIS — E039 Hypothyroidism, unspecified: Secondary | ICD-10-CM

## 2018-01-25 ENCOUNTER — Telehealth: Payer: Self-pay | Admitting: Internal Medicine

## 2018-01-25 LAB — TSH: TSH: 11.9 u[IU]/mL — ABNORMAL HIGH (ref 0.450–4.500)

## 2018-01-25 NOTE — Telephone Encounter (Signed)
I called and spoke with the patient regarding her most recent TSH.    TSH as of 01/24/18 is 11.900 (up from 7.390 1 month ago).   Dr. Elberta Fortis reviewed in Dr. Odessa Fleming absence:  Notes recorded by Regan Lemming, MD on 01/25/2018 at 8:26 AM EDT TSH elevated. Needs to follow up with PCP to see if there are any other cuases and start synthroid. May need to come off amiodarone  Per the patient, she would like to see what Dr. Graciela Husbands recommends when he is back in town next week.  She states Dr. Graciela Husbands had discussed with her previously about decreasing the dose of her amiodarone.  She is aware I will review with Dr. Graciela Husbands next week and call her back with further recommendations.   I did go ahead and forward a copy of her TSH from 01/24/18 to Dr. Judithann Sheen per her request.

## 2018-02-01 MED ORDER — LEVOTHYROXINE SODIUM 25 MCG PO TABS: 25.0000 ug | ORAL_TABLET | Freq: Every day | ORAL | 1 refills | Status: DC

## 2018-02-01 NOTE — Telephone Encounter (Signed)
Notes recorded by Duke Salvia, MD on 01/31/2018 at 1:25 PM EDT Would start her on synthroid 25 and have her followup with PCP in 4 weeks  I called the patient and notified her of Dr. Odessa Fleming recommendations to start synthroid 25 mcg once daily and see her PCP in about 4 weeks. She is aware that we will leave her current dose of amiodarone alone for now per Dr. Graciela Husbands.  The patient voices understanding of the above and is agreeable. RX will be sent [facility] on [address]. Per the patient, she has follow up with her PCP next week already.   Copy of labs forwarded to the patient's PCP.

## 2018-02-15 ENCOUNTER — Other Ambulatory Visit: Payer: Self-pay | Admitting: Cardiovascular Disease

## 2018-02-21 ENCOUNTER — Other Ambulatory Visit: Payer: Self-pay | Admitting: Family Medicine

## 2018-02-21 DIAGNOSIS — S0993XA Unspecified injury of face, initial encounter: Secondary | ICD-10-CM

## 2018-02-21 DIAGNOSIS — Z7901 Long term (current) use of anticoagulants: Secondary | ICD-10-CM

## 2018-02-27 ENCOUNTER — Ambulatory Visit
Admission: RE | Admit: 2018-02-27 | Discharge: 2018-02-27 | Disposition: A | Discharge status: Home / Self Care | Payer: Medicare Other | Source: Ambulatory Visit | Attending: Family Medicine | Admitting: Family Medicine

## 2018-02-27 DIAGNOSIS — Z7901 Long term (current) use of anticoagulants: Secondary | ICD-10-CM | POA: Insufficient documentation

## 2018-02-27 DIAGNOSIS — S0993XA Unspecified injury of face, initial encounter: Secondary | ICD-10-CM

## 2018-02-27 DIAGNOSIS — R220 Localized swelling, mass and lump, head: Secondary | ICD-10-CM | POA: Insufficient documentation

## 2018-02-27 DIAGNOSIS — W19XXXA Unspecified fall, initial encounter: Secondary | ICD-10-CM | POA: Diagnosis not present

## 2018-03-16 ENCOUNTER — Ambulatory Visit: Payer: Medicare Other | Admitting: Internal Medicine

## 2018-03-17 ENCOUNTER — Other Ambulatory Visit: Payer: Self-pay | Admitting: Cardiovascular Disease

## 2018-03-17 ENCOUNTER — Other Ambulatory Visit: Payer: Self-pay | Admitting: Internal Medicine

## 2018-03-20 NOTE — Telephone Encounter (Signed)
Please review for refill.  

## 2018-04-16 ENCOUNTER — Other Ambulatory Visit: Payer: Self-pay | Admitting: Cardiovascular Disease

## 2018-05-11 ENCOUNTER — Ambulatory Visit (INDEPENDENT_AMBULATORY_CARE_PROVIDER_SITE_OTHER): Payer: Medicare Other | Admitting: Internal Medicine

## 2018-05-11 ENCOUNTER — Encounter: Payer: Self-pay | Admitting: Internal Medicine

## 2018-05-11 VITALS — BP 144/60 | HR 74 | Ht 63.0 in | Wt 242.5 lb

## 2018-05-11 DIAGNOSIS — I5032 Chronic diastolic (congestive) heart failure: Secondary | ICD-10-CM | POA: Diagnosis not present

## 2018-05-11 DIAGNOSIS — I480 Paroxysmal atrial fibrillation: Secondary | ICD-10-CM | POA: Diagnosis not present

## 2018-05-11 DIAGNOSIS — I428 Other cardiomyopathies: Secondary | ICD-10-CM

## 2018-05-11 NOTE — Patient Instructions (Signed)
Medication Instructions:  - Your physician recommends that you continue on your current medications as directed. Please refer to the Current Medication list given to you today.  If you need a refill on your cardiac medications before your next appointment, please call your pharmacy.   Lab work: - CBC order given today- please take this to your primary care when you have labs done again  If you have labs (blood work) drawn today and your tests are completely normal, you will receive your results only by: Marland Kitchen MyChart Message (if you have MyChart) OR . A paper copy in the mail If you have any lab test that is abnormal or we need to change your treatment, we will call you to review the results.  Testing/Procedures: - none ordered  Follow-Up: At [facility], you and your health needs are our priority.  As part of our continuing mission to provide you with exceptional heart care, we have created designated Provider Care Teams.  These Care Teams include your primary Cardiologist (physician) and Advanced Practice Providers (APPs -  Physician Assistants and Nurse Practitioners) who all work together to provide you with the care you need, when you need it. . You will need a follow up appointment in 6 months with Dr. Graciela Husbands  Please call our office 2 months in advance to schedule this appointment.    Any Other Special Instructions Will Be Listed Below (If Applicable). - N/A

## 2018-05-11 NOTE — Progress Notes (Signed)
Patient Care Team: Marguarite Arbour, MD as PCP - General (Internal Medicine)   HPI  Stacey [name] is a 68 y.o. 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    Hospitalized 6/19 for initiation of dofetilide, reverted to sinus but developed TdP an dQT prolongation. It was washed out without recurrent TdP and amiodarone initiated.  anticoagulation with Xarelto.  Problems with bruising.  No significant bleeding.  She saw Dr. Fawn Kirk 6/19 for consideration of catheter ablation and at that point elected to continue amiodarone.  No interval atrial fibrillation of which she is aware.  Patient denies symptoms of GI intolerance, sun sensitivity, neurological symptoms attributable to amiodarone.    Functional status stable without chest pain or edema   DATE TEST EF   7/15 LHC  55-60 % No obs CAD  5/19 Echo   20.25 %   8/19 Echo  50-55%    Date Cr TSH  LFTs Hgb  6/19 1.13 2.75 16 13.1(5/19)  10/19  11.9    12/19 1.2 8.15 18        Records and Results Reviewed outpt labs and notes   Past Medical History:  Diagnosis Date  . Anxiety   . Arthritis    "knees" (09/26/2017)  . Chronic diastolic CHF (congestive heart failure) (HCC)    a. Dx 08/2013 in setting of rapid afib;  b. 08/2013 Echo: EF 55-60%, mildly dil LA, nl RV.  Marland Kitchen Drug-induced torsades de pointes 10/06/2017  . GERD (gastroesophageal reflux disease)   . High cholesterol   . History of blood transfusion 1975   "related to ectopic pregnancy  . Hypertension   . Non-obstructive CAD    a. 10/2013 Myoview: anteroseptal, apical, septal mild ischemia, nl LV fxn;  b. Cath: LAD 46m LCX 20ost, RCA 90m.  . Obesity   . OSA on CPAP   . Paroxysmal atrial fibrillation (HCC)    a. Dx 08/2013. CHA2DS2VASc = 3 (diast chf, htn, female) -->Xarelto initiated; b. 09/2013 s/p DCCV;  c. 10/2013 recurrent afib->Flecainide added->S/P DCCV;  d. 11/2013 Recurrent afib while off of flecainide (2/2 abnl nuc  study)-->Flecainide resumed after nl cath-->DCCV;  e. Recurrent Afib 01/2014-->Flecainide increased to 150mg  BID.  Marland Kitchen Pneumonia    "1-2 times" (09/26/2017)    Past Surgical History:  Procedure Laterality Date  . BREAST BIOPSY Right 2007   stereo-neg  . CARDIAC CATHETERIZATION  10/2013   [facility]  . CARDIOVERSION N/A 08/29/2017   Procedure: CARDIOVERSION;  Surgeon: Iran Ouch, MD;  Location: [facility] ORS;  Service: Cardiovascular;  Laterality: N/A;  . CARDIOVERSION     "I've had a total of 4" (09/26/2017)  . ECTOPIC PREGNANCY SURGERY  1975  . TONSILLECTOMY      Current Meds  Medication Sig  . amiodarone (PACERONE) 200 MG tablet Take 200 mg by mouth daily.  . carvedilol (COREG) 6.25 MG tablet Take 1 tablet (6.25 mg total) by mouth 2 (two) times daily.  . diclofenac sodium (VOLTAREN) 1 % GEL Apply 1 application topically 3 (three) times daily as needed (knee pain).  Marland Kitchen esomeprazole (NEXIUM) 40 MG capsule TAKE 1 CAPSULE BY MOUTH EVERY MORNING  . furosemide (LASIX) 40 MG tablet TAKE 1 TABLET BY MOUTH ONCE DAILY  . HYDROcodone-acetaminophen (NORCO) 10-325 MG tablet Take 1 tablet by mouth every 6 (six) hours as needed for moderate pain or severe pain.   Marland Kitchen levothyroxine (SYNTHROID, LEVOTHROID) 25 MCG tablet Take 1 tablet (25 mcg  total) by mouth daily before breakfast.  . magnesium oxide (MAG-OX) 400 MG tablet Take 400 mg by mouth daily.  . potassium chloride SA (K-DUR,KLOR-CON) 20 MEQ tablet Take 1 tablet (20 mEq total) by mouth daily.  . rivaroxaban (XARELTO) 20 MG TABS tablet Take 1 tablet (20 mg total) by mouth daily with supper.  . sacubitril-valsartan (ENTRESTO) 24-26 MG Take 1 tablet by mouth 2 (two) times daily.  Marland Kitchen venlafaxine (EFFEXOR) 75 MG tablet Take 75 mg by mouth 2 (two) times daily.  Marland Kitchen zolpidem (AMBIEN) 10 MG tablet Take 10 mg by mouth at bedtime.     Allergies  Allergen Reactions  . Levaquin [Levofloxacin In D5w] Other (See Comments)    Severe dizziness   . Metoprolol Itching,  Nausea And Vomiting, Rash and Other (See Comments)    Dizziness  . Tikosyn [Dofetilide] Other (See Comments)    Developed Torsades on Tikosyn 09/28/17  . Ace Inhibitors     Cough       Review of Systems negative except from HPI and PMH  Physical Exam BP (!) 144/60 (BP Location: Left Arm, Patient Position: Sitting, Cuff Size: Large)   Pulse 74   Ht 5\' 3"  (1.6 m)   Wt 242 lb 8 oz (110 kg)   BMI 42.96 kg/m  Well developed and nourished in no acute distress HENT normal Neck supple with JVP-flat Clear Regular rate and rhythm, no murmurs or gallops Abd-soft with active BS No Clubbing cyanosis edema Skin-warm and dry A & Oriented  Grossly normal sensory and motor function    ECG demonstrates sinus at 74 Interval 17/08/42 Axis leftward at -21 Poor R wave progression and ST segment flattening  Assessment and  Plan  Cardiomyopathy-presumed nonischemic-rate related interval near normalization  CHF chronic systolic>> diastolic  Atrial fibrillation-persistent  High Risk Medication Surveillance-- Amiodarone therapy  Hypothyroidism  Iatrogenic -- treated   Treated sleep apnea   Maintaining sinus rhythm   Euvolemic continue current meds  On Anticoagulation; some bruising.  She will look into the cost of alternative DOAC's.  We will await lab work from Dr. Judithann Sheen to check her CBC.  We spent more than 50% of our >25 min visit in face to face counseling regarding the above    Current medicines are reviewed at length with the patient today .  The patient does not  have concerns regarding medicines.

## 2018-05-13 ENCOUNTER — Other Ambulatory Visit: Payer: Self-pay | Admitting: Internal Medicine

## 2018-05-20 ENCOUNTER — Other Ambulatory Visit: Payer: Self-pay | Admitting: Internal Medicine

## 2018-05-25 ENCOUNTER — Telehealth: Payer: Self-pay | Admitting: Internal Medicine

## 2018-05-25 NOTE — Telephone Encounter (Signed)
Please call to discuss Entresto doseage

## 2018-05-25 NOTE — Telephone Encounter (Signed)
Spoke with patient. Says she thinks she's been taking the Entresto 49-51mg  two times a day.  It was reduced to 24-26 mg in June 2019.  Patient's current bottle is Entresto 49-51 mg and she thinks she's been taking that dose for a while.  Called patient's pharmacy.  Says patient got the 24-26 mg Entresto filled from June to August 2019.  Then September, October and December she received the 49-51mg .  Unclear of how this happened. Patient is out of medication.  Refilled Entresto by giving verbal to pharmacist for the 24-26 mg two times a day.  Called patient to let her know it was refilled and that I would make physician aware she had been taking the increased dose.   She last saw Dr Graciela Husbands 05/11/18 and she reported taking the 24-26 mg; however, she realized her bottle said the increased dose. Routing to Dr Graciela Husbands for review if any changes are necessary.

## 2018-05-30 NOTE — Telephone Encounter (Signed)
If she tolerated 49/51 let us refill it at that dose please

## 2018-05-31 MED ORDER — SACUBITRIL-VALSARTAN 49-51 MG PO TABS: 1.0000 | ORAL_TABLET | Freq: Two times a day (BID) | ORAL | 2 refills | Status: DC

## 2018-05-31 NOTE — Telephone Encounter (Signed)
No answer. Left detailed message, ok per DPR, that DR Graciela Husbands would like her to continue taking the increased dose of Entresto 49/51 mg and that I would send in the prescription. Advised her to call back if any further questions.

## 2018-05-31 NOTE — Telephone Encounter (Signed)
Patient returning call .  She did not want to hold for the nurse.  States she is tolerating entresto well and will call back if she has any issues

## 2018-05-31 NOTE — Telephone Encounter (Signed)
No answer. Left message to call back.   

## 2018-06-04 ENCOUNTER — Other Ambulatory Visit: Payer: Self-pay | Admitting: Cardiovascular Disease

## 2018-07-15 ENCOUNTER — Other Ambulatory Visit: Payer: Self-pay | Admitting: Cardiovascular Disease

## 2018-09-19 ENCOUNTER — Telehealth: Payer: Self-pay

## 2018-09-19 NOTE — Telephone Encounter (Signed)
Called patient from recall.  No answer. No VM.  This is the first attempt per recall list.

## 2018-09-22 ENCOUNTER — Telehealth: Payer: Self-pay

## 2018-09-22 MED ORDER — CARVEDILOL 6.25 MG PO TABS: 6.2500 mg | ORAL_TABLET | Freq: Two times a day (BID) | ORAL | 0 refills | Status: DC

## 2018-09-22 NOTE — Telephone Encounter (Signed)
Requested Prescriptions   Signed Prescriptions Disp Refills  . carvedilol (COREG) 6.25 MG tablet 60 tablet 0    Sig: Take 1 tablet (6.25 mg total) by mouth 2 (two) times daily.    Authorizing Provider: Duke Salvia    Ordering User: Thayer Headings, Brookie Wayment L

## 2018-09-27 NOTE — Telephone Encounter (Signed)
Patient calling States that pharmacy has not received the refill request Please resend - patient is out    *STAT* If patient is at the pharmacy, call can be transferred to refill team.   1. Which medications need to be refilled? (please list name of each medication and dose if known) carvedilol 6.25 MG  2. Which pharmacy/location (including street and city if local pharmacy) is medication to be sent to? [facility] on [address]   3. Do they need a 30 day or 90 day supply? 90 day

## 2018-09-28 ENCOUNTER — Telehealth: Payer: Self-pay

## 2018-09-28 MED ORDER — CARVEDILOL 6.25 MG PO TABS: 6.2500 mg | ORAL_TABLET | Freq: Two times a day (BID) | ORAL | 0 refills | Status: DC

## 2018-09-28 NOTE — Telephone Encounter (Signed)
Requested Prescriptions   Signed Prescriptions Disp Refills  . carvedilol (COREG) 6.25 MG tablet 60 tablet 0    Sig: Take 1 tablet (6.25 mg total) by mouth 2 (two) times daily.    Authorizing Provider: Duke Salvia    Ordering User: Thayer Headings, Gearldine Bienenstock L   Patient called stating pharmacy did not receive refill on 09/22/2018. Spoke with pharmacy who confirmed they did not receive it. Rx resent.

## 2018-09-29 ENCOUNTER — Other Ambulatory Visit: Payer: Self-pay | Admitting: Internal Medicine

## 2018-10-13 ENCOUNTER — Other Ambulatory Visit: Payer: Self-pay | Admitting: Cardiovascular Disease

## 2018-10-24 ENCOUNTER — Other Ambulatory Visit: Payer: Self-pay | Admitting: Cardiovascular Disease

## 2018-10-24 NOTE — Telephone Encounter (Signed)
Pt last saw Dr Caryl Comes 05/11/18, Last labs 09/29/17 Creat 1.13, pt is overdue for labwork, age 68, weight 110kg, CrCl 85.04, based on CrCl pt is on appropriate dosage of Xarelto 20mg  QD.  Pt will need repeat labwork at next OV on 11/09/18 with Dr Caryl Comes.  Note placed on appt.  Will refill rx.

## 2018-10-24 NOTE — Telephone Encounter (Signed)
Refill Request.  

## 2018-11-07 ENCOUNTER — Telehealth: Payer: Self-pay

## 2018-11-07 NOTE — Telephone Encounter (Signed)
    COVID-19 Pre-Screening Questions:  . In the past 7 to 10 days have you had a cough,  shortness of breath, headache, congestion, fever (100 or greater) body aches, chills, sore throat, or sudden loss of taste or sense of smell? No . Have you been around anyone with known Covid 19. No . Have you been around anyone who is awaiting Covid 19 test results in the past 7 to 10 days? No . Have you been around anyone who has been exposed to Covid 19, or has mentioned symptoms of Covid 19 within the past 7 to 10 days? NO  If you have any concerns/questions about symptoms patients report during screening (either on the phone or at [facility]). Contact the provider seeing the patient or DOD for further guidance.  If neither are available contact a member of the leadership team.            

## 2018-11-09 ENCOUNTER — Telehealth: Payer: Self-pay | Admitting: Internal Medicine

## 2018-11-09 ENCOUNTER — Ambulatory Visit: Payer: Medicare Other | Admitting: Internal Medicine

## 2018-11-09 ENCOUNTER — Telehealth: Payer: Self-pay | Admitting: Cardiovascular Disease

## 2018-11-09 NOTE — Telephone Encounter (Signed)
Call when able to reschedule September klein ov

## 2018-11-09 NOTE — Telephone Encounter (Signed)
Patient no longer sees Dr. Fletcher Anon   Per patient request deleting recalls   Stacey [name]

## 2018-11-30 NOTE — Telephone Encounter (Signed)
Attempted to schedule. Call dropped poor signal.

## 2018-12-07 ENCOUNTER — Ambulatory Visit (INDEPENDENT_AMBULATORY_CARE_PROVIDER_SITE_OTHER): Payer: Medicare Other | Admitting: Physician Assistant

## 2018-12-07 ENCOUNTER — Other Ambulatory Visit: Payer: Self-pay | Admitting: Physician Assistant

## 2018-12-07 ENCOUNTER — Telehealth: Payer: Self-pay | Admitting: Internal Medicine

## 2018-12-07 ENCOUNTER — Other Ambulatory Visit
Admission: RE | Admit: 2018-12-07 | Discharge: 2018-12-07 | Disposition: A | Discharge status: Home / Self Care | Payer: Medicare Other | Source: Ambulatory Visit | Attending: Cardiovascular Disease | Admitting: Cardiovascular Disease

## 2018-12-07 ENCOUNTER — Encounter: Payer: Self-pay | Admitting: Physician Assistant

## 2018-12-07 ENCOUNTER — Other Ambulatory Visit: Payer: Self-pay

## 2018-12-07 VITALS — BP 118/86 | HR 125 | Ht 63.0 in | Wt 248.0 lb

## 2018-12-07 DIAGNOSIS — I4891 Unspecified atrial fibrillation: Secondary | ICD-10-CM

## 2018-12-07 DIAGNOSIS — I428 Other cardiomyopathies: Secondary | ICD-10-CM

## 2018-12-07 DIAGNOSIS — I4819 Other persistent atrial fibrillation: Secondary | ICD-10-CM

## 2018-12-07 DIAGNOSIS — Z20828 Contact with and (suspected) exposure to other viral communicable diseases: Secondary | ICD-10-CM | POA: Insufficient documentation

## 2018-12-07 DIAGNOSIS — Z01812 Encounter for preprocedural laboratory examination: Secondary | ICD-10-CM | POA: Diagnosis not present

## 2018-12-07 DIAGNOSIS — I519 Heart disease, unspecified: Secondary | ICD-10-CM

## 2018-12-07 DIAGNOSIS — I10 Essential (primary) hypertension: Secondary | ICD-10-CM

## 2018-12-07 DIAGNOSIS — G4733 Obstructive sleep apnea (adult) (pediatric): Secondary | ICD-10-CM

## 2018-12-07 LAB — SARS CORONAVIRUS 2 BY RT PCR (HOSPITAL ORDER, PERFORMED IN ~~LOC~~ HOSPITAL LAB): SARS Coronavirus 2: NEGATIVE

## 2018-12-07 NOTE — Telephone Encounter (Signed)
STAT if HR is under 50 or over 120 (normal HR is 60-100 beats per minute)  1) What is your heart rate? 121  2) Do you have a log of your heart rate readings (document readings)? No log, but have been jumping this morning and last night from 87-137  3) Do you have any other symptoms? Very tired, sob, (yesterday) foot was swollen

## 2018-12-07 NOTE — Progress Notes (Signed)
Orders for DCCV

## 2018-12-07 NOTE — Patient Instructions (Addendum)
Medication Instructions:  - Your physician has recommended you make the following change in your medication:   1) Increase amiodarone 200 mg: - take 2 tablets (400 mg) by mouth TWICE daily x 1 week, then - take 1 tablet (200 mg) by mouth TWICE daily x 1 week, then - take 1 tablet (200 mg) by mouth ONCE daily  If you need a refill on your cardiac medications before your next appointment, please call your pharmacy.   Lab work: - Pre-procedure COVID swab today (rapid) You will need to get back in your car and drive around to the [facility] to have this done today  If you have labs (blood work) drawn today and your tests are completely normal, you will receive your results only by: Marland Kitchen MyChart Message (if you have MyChart) OR . A paper copy in the mail If you have any lab test that is abnormal or we need to change your treatment, we will call you to review the results.  Testing/Procedures: - Your physician has recommended that you have a Cardioversion (DCCV). Electrical Cardioversion uses a jolt of electricity to your heart either through paddles or wired patches attached to your chest. This is a controlled, usually prescheduled, procedure. Defibrillation is done under light anesthesia in the hospital, and you usually go home the day of the procedure. This is done to get your heart back into a normal rhythm. You are not awake for the procedure. Please see the instruction sheet given to you today.  -You are scheduled for a Cardioversion on Friday 12/08/18 with Dr. Rockey Situ.  - Please arrive at the [place] [facility] at 6:30 a.m. on the day of your procedure.  DIET INSTRUCTIONS:  Nothing to eat or drink after midnight except your medications with a              sip of water.         1) Labs:  Pre-procedure COVID swab today  2) Medications:  YOU MAY TAKE ALL of your medications with a small amount of water in the morning unless listed below: - HOLD lasix (furosemide) in the  morning  3) Must have a responsible person to drive you home.  4) Bring a current list of your medications and current insurance cards.    If you have any questions after you get home, please call the office at [phone]   Follow-Up: At [facility], you and your health needs are our priority.  As part of our continuing mission to provide you with exceptional heart care, we have created designated Provider Care Teams.  These Care Teams include your primary Cardiologist (physician) and Advanced Practice Providers (APPs -  Physician Assistants and Nurse Practitioners) who all work together to provide you with the care you need, when you need it. . In 2-3 weeks with Dr. Caryl Comes APP  Any Other Special Instructions Will Be Listed Below (If Applicable). - N/A

## 2018-12-07 NOTE — Telephone Encounter (Signed)
Spoke with patient and she states that yesterday her heart rates have been irregular. Last week she had some problems with severe pain in right foot and she was given prednisone. She completed the taper and right foot is now swollen. She has not had any ankle swelling in some time. She has noticed new symptoms of shortness of breath, increased fatigue, and pulse ox heart rates have been very irregular. Overall she has not felt well regardless of heart rates and was concerned. Reached out to scheduling and they were able to add her on at 45 today with Ryan in our office. She was agreeable with this appointment and had no further questions at this time.

## 2018-12-07 NOTE — Progress Notes (Signed)
Cardiology Office Note    Date:  12/07/2018   ID:  Stacey [name], DOB March 02, 1951, MRN [id number]  PCP:  Idelle Crouch, MD  Cardiologist:  Kathlyn Sacramento, MD  Electrophysiologist:  Virl Axe, MD   Chief Complaint: Shortness of breath/irregular heart rate  History of Present Illness:   Stacey [name] is a 68 y.o. 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 fib status post multiple cardioversions with intolerance to Tikosyn secondary to prolonged QT and torsades to points followed by the A. 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 fib.  Patient was admitted in 2015 with A. fib with RVR with echo at that time showing an EF of 55 to 60%, mildly dilated left atrium, and normal RV.  Cardiac cath showed nonobstructive CAD.  Over the years, the patient has undergone multiple cardioversions and been evaluated by several different EP physicians as well as the A. fib clinic.  More recently, she was seen in 08/2017 and noted to be in A. fib with RVR with exertional dyspnea.  Echo at that time showed a newly reduced EF of 20 to 25%, diffuse hypokinesis, mild mitral regurgitation, mildly dilated left atrium, grossly normal RV systolic function.  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  She was hospitalized in 09/2017 for initiation of Tikosyn, reverted to sinus rhythm, though developed torsades to points and a prolonged QT.  With washing out of Tikosyn she had no recurrent torsades the point and patient was initiated on amiodarone.  She did see EP with consideration for catheter ablation though at that time elected to continue amiodarone.  Follow-up echo in 11/2017 demonstrated improvement in LV systolic function with an EF of 50 to 52%, grade 1 diastolic dysfunction, mildly dilated  left atrium, normal RV cavity size and systolic function, mildly dilated right atrium.  She was most recently evaluated by Dr. Caryl Comes in 04/2018 and denied any interval A. fib of which she was aware.  Patient contacted our office this morning noting irregular heart rates at home by her pulse ox over the past day as well as some lower extremity swelling, shortness of breath, and increased fatigue.  In this setting, she was added on as a working this morning.  Patient was recently diagnosed with presumed gout involving the right ankle and was prescribed 2 separate courses of prednisone with resolution of right ankle pain.  In this setting, the patient developed increased shortness of breath and tachypalpitations on 12/06/2018 with heart rate readings ranging from the 80s to 130s bpm by her pulse ox at home.  She also notes increased dyspnea on exertion.  Weight has been stable.  She has stable two-pillow orthopnea.  Her right lower extremity swelling has significantly improved and is now back to baseline.  Symptoms are consistent with her prior known episodes of A. fib with RVR.  She reports compliance with all medications including amiodarone 200 mg daily, Coreg 12.5 mg twice daily, Xarelto 20 mg daily, and Entresto 41/59 mg twice daily.  She denies any falls since she was last seen.  No BRBPR or melena.  Labs: 10/2018 -potassium 4.4, serum creatinine 1.4 (baseline approximately 1.0-1.4) 09/2018 -AST/ALT normal, Hgb 13.0, PLT 236, TSH normal   Past Medical History:  Diagnosis Date   Anxiety    Arthritis    "knees" (09/26/2017)  Chronic diastolic CHF (congestive heart failure) (HCC)    a. Dx 08/2013 in setting of rapid afib;  b. 08/2013 Echo: EF 55-60%, mildly dil LA, nl RV.   Drug-induced torsades de pointes 10/06/2017   GERD (gastroesophageal reflux disease)    High cholesterol    History of blood transfusion 1975   "related to ectopic pregnancy   Hypertension    Non-obstructive CAD    a.  10/2013 Myoview: anteroseptal, apical, septal mild ischemia, nl LV fxn;  b. Cath: LAD 6238m LCX 20ost, RCA 2838m.   Obesity    OSA on CPAP    Paroxysmal atrial fibrillation (HCC)    a. Dx 08/2013. CHA2DS2VASc = 3 (diast chf, htn, female) -->Xarelto initiated; b. 09/2013 s/p DCCV;  c. 10/2013 recurrent afib->Flecainide added->S/P DCCV;  d. 11/2013 Recurrent afib while off of flecainide (2/2 abnl nuc study)-->Flecainide resumed after nl cath-->DCCV;  e. Recurrent Afib 01/2014-->Flecainide increased to 150mg  BID.   Pneumonia    "1-2 times" (09/26/2017)    Past Surgical History:  Procedure Laterality Date   BREAST BIOPSY Right 2007   stereo-neg   CARDIAC CATHETERIZATION  10/2013   [facility]   CARDIOVERSION N/A 08/29/2017   Procedure: CARDIOVERSION;  Surgeon: Iran OuchArida, Muhammad A, MD;  Location: [facility] ORS;  Service: Cardiovascular;  Laterality: N/A;   CARDIOVERSION     "I've had a total of 4" (09/26/2017)   ECTOPIC PREGNANCY SURGERY  1975   TONSILLECTOMY      Current Medications: No outpatient medications have been marked as taking for the 12/07/18 encounter (Office Visit) with Sondra Bargesunn, Efton Thomley M, PA-C.    Allergies:   Levaquin [levofloxacin in d5w], Metoprolol, Tikosyn [dofetilide], and Ace inhibitors   Social History   Socioeconomic History   Marital status: Married    Spouse name: Not on file   Number of children: Not on file   Years of education: Not on file   Highest education level: Not on file  Occupational History   Not on file  Social Needs   Financial resource strain: Not on file   Food insecurity    Worry: Not on file    Inability: Not on file   Transportation needs    Medical: Not on file    Non-medical: Not on file  Tobacco Use   Smoking status: Never Smoker   Smokeless tobacco: Never Used  Substance and Sexual Activity   Alcohol use: Yes    Alcohol/week: 7.0 standard drinks    Types: 7 Glasses of wine per week    Comment: 1 glass of wine/day.   Drug use: Never     Sexual activity: Not Currently  Lifestyle   Physical activity    Days per week: Not on file    Minutes per session: Not on file   Stress: Not on file  Relationships   Social connections    Talks on phone: Not on file    Gets together: Not on file    Attends religious service: Not on file    Active member of club or organization: Not on file    Attends meetings of clubs or organizations: Not on file    Relationship status: Not on file  Other Topics Concern   Not on file  Social History Narrative   [place]  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      Family History:  The patient's family history includes Breast cancer (age of onset: 9755) in her mother; CAD in her father; Heart attack in  her father; Hypertension in her mother.  ROS:   Review of Systems  Constitutional: Positive for malaise/fatigue. Negative for chills, diaphoresis, fever and weight loss.  HENT: Negative for congestion.   Eyes: Negative for discharge and redness.  Respiratory: Positive for shortness of breath. Negative for cough, hemoptysis, sputum production and wheezing.   Cardiovascular: Positive for palpitations and leg swelling. Negative for chest pain, orthopnea, claudication and PND.  Gastrointestinal: Negative for abdominal pain, blood in stool, heartburn, melena, nausea and vomiting.  Genitourinary: Negative for hematuria.  Musculoskeletal: Positive for joint pain. Negative for falls and myalgias.  Skin: Negative for rash.  Neurological: Positive for weakness. Negative for dizziness, tingling, tremors, sensory change, speech change, focal weakness and loss of consciousness.  Endo/Heme/Allergies: Does not bruise/bleed easily.  Psychiatric/Behavioral: Negative for substance abuse. The patient is not nervous/anxious.   All other systems reviewed and are negative.    EKGs/Labs/Other Studies Reviewed:    Studies reviewed were summarized above. The additional studies were reviewed today:  2D Echo  11/2017: - Procedure narrative: Transthoracic echocardiography. Image   quality was suboptimal. The study was technically difficult, as a   result of poor acoustic windows. Intravenous contrast (Definity)   was administered. - Left ventricle: Systolic function was normal. The estimated   ejection fraction was in the range of 50% to 55%. Doppler   parameters are consistent with abnormal left ventricular   relaxation (grade 1 diastolic dysfunction). - Left atrium: The atrium was mildly dilated. - Right ventricle: The cavity size was normal. Systolic function   was normal. - Right atrium: The atrium was mildly dilated.  Impressions:  - LVEF has improved significantly since prior echo on 09/13/17.   EKG:  EKG is ordered today.  The EKG ordered today demonstrates A. fib with RVR, 125 bpm, low voltage QRS, poor R wave progression along the precordial leads, nonspecific ST-T changes  Recent Labs: 12/15/2017: ALT 14 01/24/2018: TSH 11.900  Recent Lipid Panel    Component Value Date/Time   CHOL 194 08/28/2013 0352   TRIG 122 08/28/2013 0352   HDL 56 08/28/2013 0352   VLDL 24 08/28/2013 0352   LDLCALC 114 (H) 08/28/2013 0352    PHYSICAL EXAM:    VS:  BP 118/86    Pulse (!) 125    Ht 5\' 3"  (1.6 m)    Wt 248 lb (112.5 kg)    BMI 43.93 kg/m   BMI: Body mass index is 43.93 kg/m.  Physical Exam  Constitutional: She is oriented to person, place, and time. She appears well-developed and well-nourished.  HENT:  Head: Normocephalic and atraumatic.  Eyes: Right eye exhibits no discharge. Left eye exhibits no discharge.  Neck: Normal range of motion. No JVD present.  Cardiovascular: S1 normal, S2 normal and normal heart sounds. An irregularly irregular rhythm present. Tachycardia present. Exam reveals no distant heart sounds, no friction rub, no midsystolic click and no opening snap.  No murmur heard. Pulses:      Posterior tibial pulses are 2+ on the right side and 2+ on the left side.    Pulmonary/Chest: Effort normal and breath sounds normal. No respiratory distress. She has no decreased breath sounds. She has no wheezes. She has no rales. She exhibits no tenderness.  Abdominal: Soft. She exhibits no distension. There is no abdominal tenderness.  Musculoskeletal:        General: No edema.  Neurological: She is alert and oriented to person, place, and time.  Skin: Skin is warm and  dry. No cyanosis. Nails show no clubbing.  Psychiatric: She has a normal mood and affect. Her speech is normal and behavior is normal. Judgment and thought content normal.    Wt Readings from Last 3 Encounters:  12/07/18 248 lb (112.5 kg)  05/11/18 242 lb 8 oz (110 kg)  12/15/17 236 lb (107 kg)     ASSESSMENT & PLAN:   1. Persistent A. fib with RVR: Likely in the setting of recent presumed gout flare involving the right ankle which was treated with 2 separate tapers of prednisone.  She reports compliance with anticoagulation and denies missing any doses.  We will reload with amiodarone 400 mg twice daily for 1 week followed by 200 mg twice daily for 1 week followed by 20 mg daily thereafter.  Recent labs demonstrated potassium at goal with normal liver and thyroid function as outlined above.  We did discuss titration of Coreg to 25 mg twice daily the patient is somewhat hesitant to do this.  I also discussed the potential of transitioning her from carvedilol to metoprolol for added rate control however the patient reports intolerance to metoprolol with adverse effect of itching, nausea, vomiting, rash, and fatigue.  In this setting, we will continue her on current dose of Coreg 12.5 mg twice daily.  Patient will be scheduled for cardioversion on 12/08/2018.   She will COVID tested today, this was reviewed with PAT.  She continues to prefer intermittent cardioversion as needed and declines revisiting EP for consideration of catheter ablation at this time.  2. Chronic combined CHF: Patient has  subsequently demonstrated normalization of LV systolic function with her LV dysfunction felt to be tachycardia mediated.  Volume is difficult to assess on physical exam given body habitus though she does not appear grossly volume overloaded at this time.  However, should she remain in A. fib with RVR for an extended period of time I would certainly have concerns that she could quickly become volume overloaded.  In this setting, we are attempting to expedite cardioversion as soon as possible as outlined above.  She will continue Lasix 40 mg daily.  3. Hypertension: Blood pressures well controlled today.  Continue current medications as outlined above.  4. Obesity/OSA: Weight loss is advised.  Compliance with CPAP recommended.  Disposition: F/u with Dr. Kirke CorinArida or an APP in 1 week and EP as directed.    Medication Adjustments/Labs and Tests Ordered: Current medicines are reviewed at length with the patient today.  Concerns regarding medicines are outlined above. Medication changes, Labs and Tests ordered today are summarized above and listed in the Patient Instructions accessible in Encounters.   Signed, Eula Listenyan Suan Pyeatt, PA-C 12/07/2018 11:55 AM     [facility] -  [address] [place], [place] [address] [phone]

## 2018-12-07 NOTE — Telephone Encounter (Signed)
Will await appointment later this morning.

## 2018-12-08 ENCOUNTER — Ambulatory Visit
Admission: RE | Admit: 2018-12-08 | Discharge: 2018-12-08 | Disposition: A | Discharge status: Home / Self Care | Payer: Medicare Other | Attending: Cardiovascular Disease | Admitting: Cardiovascular Disease

## 2018-12-08 ENCOUNTER — Ambulatory Visit: Payer: Medicare Other | Admitting: Certified Registered Nurse Anesthetist

## 2018-12-08 ENCOUNTER — Encounter
Admission: RE | Disposition: A | Discharge status: Home / Self Care | Payer: Medicare Other | Source: Home / Self Care | Attending: Cardiovascular Disease

## 2018-12-08 ENCOUNTER — Other Ambulatory Visit: Payer: Self-pay

## 2018-12-08 ENCOUNTER — Encounter: Payer: Self-pay | Admitting: *Deleted

## 2018-12-08 DIAGNOSIS — I2510 Atherosclerotic heart disease of native coronary artery without angina pectoris: Secondary | ICD-10-CM | POA: Diagnosis not present

## 2018-12-08 DIAGNOSIS — M1990 Unspecified osteoarthritis, unspecified site: Secondary | ICD-10-CM | POA: Diagnosis not present

## 2018-12-08 DIAGNOSIS — F329 Major depressive disorder, single episode, unspecified: Secondary | ICD-10-CM | POA: Diagnosis not present

## 2018-12-08 DIAGNOSIS — I4819 Other persistent atrial fibrillation: Secondary | ICD-10-CM | POA: Insufficient documentation

## 2018-12-08 DIAGNOSIS — F419 Anxiety disorder, unspecified: Secondary | ICD-10-CM | POA: Diagnosis not present

## 2018-12-08 DIAGNOSIS — E7800 Pure hypercholesterolemia, unspecified: Secondary | ICD-10-CM | POA: Diagnosis not present

## 2018-12-08 DIAGNOSIS — Z6841 Body Mass Index (BMI) 40.0 and over, adult: Secondary | ICD-10-CM | POA: Insufficient documentation

## 2018-12-08 DIAGNOSIS — I5042 Chronic combined systolic (congestive) and diastolic (congestive) heart failure: Secondary | ICD-10-CM | POA: Diagnosis not present

## 2018-12-08 DIAGNOSIS — Z7901 Long term (current) use of anticoagulants: Secondary | ICD-10-CM | POA: Diagnosis not present

## 2018-12-08 DIAGNOSIS — K219 Gastro-esophageal reflux disease without esophagitis: Secondary | ICD-10-CM | POA: Insufficient documentation

## 2018-12-08 DIAGNOSIS — G4733 Obstructive sleep apnea (adult) (pediatric): Secondary | ICD-10-CM | POA: Insufficient documentation

## 2018-12-08 DIAGNOSIS — I110 Hypertensive heart disease with heart failure: Secondary | ICD-10-CM | POA: Insufficient documentation

## 2018-12-08 DIAGNOSIS — E669 Obesity, unspecified: Secondary | ICD-10-CM | POA: Diagnosis not present

## 2018-12-08 HISTORY — PX: CARDIOVERSION: SHX1299

## 2018-12-08 SURGERY — CARDIOVERSION
Anesthesia: General

## 2018-12-08 MED ORDER — SODIUM CHLORIDE 0.9 % IV SOLN
INTRAVENOUS | Status: DC
  Administered 2018-12-08: 08:00:00 via INTRAVENOUS

## 2018-12-08 MED ORDER — PROPOFOL 10 MG/ML IV BOLUS
INTRAVENOUS | Status: DC | PRN
  Administered 2018-12-08: 30 mg via INTRAVENOUS
  Administered 2018-12-08 (×2): 20 mg via INTRAVENOUS
  Administered 2018-12-08 (×2): 30 mg via INTRAVENOUS

## 2018-12-08 NOTE — CV Procedure (Signed)
Cardioversion procedure note For atrial fibrillation, persistent.  Procedure Details:  Consent: Risks of procedure as well as the alternatives and risks of each were explained to the (patient/caregiver). Consent for procedure obtained.  Time Out: Verified patient identification, verified procedure, site/side was marked, verified correct patient position, special equipment/implants available, medications/allergies/relevent history reviewed, required imaging and test results available. Performed  Patient placed on cardiac monitor, pulse oximetry, supplemental oxygen as necessary.  Sedation given: propofol IV, Dr. Ola Spurr Pacer pads placed anterior and posterior chest.   Cardioverted 2 time(s).  Cardioverted at  150J, 200J. Synchronized biphasic Converted to NSR   Evaluation: Findings: Post procedure EKG shows: NSR rate in the 76O-11 Complications: None Patient did tolerate procedure well.  Time Spent Directly with the Patient:  98 minutes   Esmond Plants, M.D., Ph.D.

## 2018-12-08 NOTE — Anesthesia Post-op Follow-up Note (Signed)
Anesthesia QCDR form completed.        

## 2018-12-08 NOTE — Transfer of Care (Signed)
Immediate Anesthesia Transfer of Care Note  Patient: Stacey [name]  Procedure(s) Performed: CARDIOVERSION (N/A )  Patient Location: PACU  Anesthesia Type:General  Level of Consciousness: awake, alert  and oriented  Airway & Oxygen Therapy: Patient Spontanous Breathing and Patient connected to nasal cannula oxygen  Post-op Assessment: Report given to RN and Post -op Vital signs reviewed and stable  Post vital signs: Reviewed and stable  Last Vitals:  Vitals Value Taken Time  BP 93/57 12/08/18 0818  Temp 36.7 C 12/08/18 0818  Pulse 64 12/08/18 0818  Resp 20 12/08/18 0818  SpO2 96 % 12/08/18 0818    Last Pain:  Vitals:   12/08/18 0818  TempSrc: Temporal  PainSc:          Complications: No apparent anesthesia complications

## 2018-12-08 NOTE — Anesthesia Postprocedure Evaluation (Signed)
Anesthesia Post Note  Patient: Stacey [name]  Procedure(s) Performed: CARDIOVERSION (N/A )  Patient location during evaluation: PACU Anesthesia Type: General Level of consciousness: awake and alert Pain management: pain level controlled Vital Signs Assessment: post-procedure vital signs reviewed and stable Respiratory status: spontaneous breathing, nonlabored ventilation and respiratory function stable Cardiovascular status: blood pressure returned to baseline and stable Postop Assessment: no apparent nausea or vomiting Anesthetic complications: no     Last Vitals:  Vitals:   12/08/18 0905 12/08/18 0910  BP: 131/73 (!) 167/89  Pulse: 65 66  Resp: 16 18  Temp:    SpO2: 95%     Last Pain:  Vitals:   12/08/18 0910  TempSrc:   PainSc: 0-No pain                 Durenda Hurt

## 2018-12-08 NOTE — Anesthesia Preprocedure Evaluation (Signed)
Anesthesia Evaluation  Patient identified by MRN, date of birth, ID band Patient awake    Reviewed: Allergy & Precautions, H&P , NPO status , Patient's Chart, lab work & pertinent test results  Airway Mallampati: I  TM Distance: >3 FB Neck ROM: full    Dental  (+) Chipped   Pulmonary sleep apnea ,           Cardiovascular hypertension, + CAD and +CHF (HFpEF)  + dysrhythmias Atrial Fibrillation      Neuro/Psych PSYCHIATRIC DISORDERS Anxiety Depression negative neurological ROS     GI/Hepatic Neg liver ROS, GERD  Controlled,  Endo/Other  Morbid obesity  Renal/GU negative Renal ROS  negative genitourinary   Musculoskeletal   Abdominal   Peds  Hematology negative hematology ROS (+)   Anesthesia Other Findings Past Medical History: No date: Anxiety No date: Arthritis     Comment:  "knees" (09/26/2017) No date: Chronic diastolic CHF (congestive heart failure) (HCC)     Comment:  a. Dx 08/2013 in setting of rapid afib;  b. 08/2013 Echo:               EF 55-60%, mildly dil LA, nl RV. 10/06/2017: Drug-induced torsades de pointes No date: GERD (gastroesophageal reflux disease) No date: High cholesterol 1975: History of blood transfusion     Comment:  "related to ectopic pregnancy No date: Hypertension No date: Non-obstructive CAD     Comment:  a. 10/2013 Myoview: anteroseptal, apical, septal mild               ischemia, nl LV fxn;  b. Cath: LAD 21m LCX 20ost, RCA               52m. No date: Obesity No date: OSA on CPAP No date: Paroxysmal atrial fibrillation (HCC)     Comment:  a. Dx 08/2013. CHA2DS2VASc = 3 (diast chf, htn, female)               -->Xarelto initiated; b. 09/2013 s/p DCCV;  c. 10/2013               recurrent afib->Flecainide added->S/P DCCV;  d. 11/2013               Recurrent afib while off of flecainide (2/2 abnl nuc               study)-->Flecainide resumed after nl cath-->DCCV;  e.   Recurrent Afib 01/2014-->Flecainide increased to 150mg                BID. No date: Pneumonia     Comment:  "1-2 times" (09/26/2017)  Past Surgical History: 2007: BREAST BIOPSY; Right     Comment:  stereo-neg 10/2013: CARDIAC CATHETERIZATION     Comment:  [facility] 08/29/2017: CARDIOVERSION; N/A     Comment:  Procedure: CARDIOVERSION;  Surgeon: Iran Ouch,               MD;  Location: [facility] ORS;  Service: Cardiovascular;                Laterality: N/A; No date: CARDIOVERSION     Comment:  "I've had a total of 4" (09/26/2017) 1975: ECTOPIC PREGNANCY SURGERY No date: TONSILLECTOMY  BMI    Body Mass Index: 43.93 kg/m      Reproductive/Obstetrics negative OB ROS                             Anesthesia Physical Anesthesia  Plan  ASA: III  Anesthesia Plan: General   Post-op Pain Management:    Induction:   PONV Risk Score and Plan:   Airway Management Planned: Nasal Cannula and Natural Airway  Additional Equipment:   Intra-op Plan:   Post-operative Plan:   Informed Consent: I have reviewed the patients History and Physical, chart, labs and discussed the procedure including the risks, benefits and alternatives for the proposed anesthesia with the patient or authorized representative who has indicated his/her understanding and acceptance.     Dental Advisory Given  Plan Discussed with: Anesthesiologist and CRNA  Anesthesia Plan Comments:         Anesthesia Quick Evaluation

## 2018-12-08 NOTE — H&P (Signed)
H&P Addendum, pre-cardioversion  Patient was seen and evaluated prior to -cardioversion procedure Symptoms, prior testing details again confirmed with the patient Patient examined, no significant change from prior exam Lab work reviewed in detail personally by myself Patient understands risk and benefit of the procedure, willing to proceed  Signed, Tim Gollan, MD, Ph.D [facility]  

## 2018-12-08 NOTE — Anesthesia Procedure Notes (Signed)
Date/Time: 12/08/2018 8:05 AM Performed by: Johnna Acosta, CRNA Pre-anesthesia Checklist: Patient identified, Emergency Drugs available, Suction available, Patient being monitored and Timeout performed Patient Re-evaluated:Patient Re-evaluated prior to induction Oxygen Delivery Method: Nasal cannula Preoxygenation: Pre-oxygenation with 100% oxygen Induction Type: IV induction

## 2018-12-19 ENCOUNTER — Ambulatory Visit (INDEPENDENT_AMBULATORY_CARE_PROVIDER_SITE_OTHER): Payer: Medicare Other | Admitting: Internal Medicine

## 2018-12-19 ENCOUNTER — Other Ambulatory Visit: Payer: Self-pay

## 2018-12-19 ENCOUNTER — Encounter: Payer: Self-pay | Admitting: Internal Medicine

## 2018-12-19 VITALS — BP 144/62 | HR 71 | Ht 63.0 in | Wt 240.0 lb

## 2018-12-19 DIAGNOSIS — Z79899 Other long term (current) drug therapy: Secondary | ICD-10-CM

## 2018-12-19 DIAGNOSIS — I4819 Other persistent atrial fibrillation: Secondary | ICD-10-CM | POA: Diagnosis not present

## 2018-12-19 DIAGNOSIS — I428 Other cardiomyopathies: Secondary | ICD-10-CM

## 2018-12-19 DIAGNOSIS — I519 Heart disease, unspecified: Secondary | ICD-10-CM | POA: Diagnosis not present

## 2018-12-19 NOTE — Progress Notes (Signed)
Patient Care Team: Idelle Crouch, MD as PCP - General (Internal Medicine) Wellington Hampshire, MD as PCP - Cardiology (Cardiology) Deboraha Sprang, MD as PCP - Electrophysiology (Cardiology)   HPI  Stacey [name] is a 68 y.o. 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    Hospitalized 6/19 for initiation of dofetilide, reverted to sinus but developed TdP an dQT prolongation. It was washed out without recurrent TdP and amiodarone initiated.  anticoagulation with Xarelto.  Problems with bruising.  No significant bleeding.  She saw Dr. Greggory Brandy 6/19 for consideration of catheter ablation and at that point elected to continue amiodarone.  Seen 8/20 with recurrent atrial fibrillation underwent DCCV  AMiodarone was uptitrated       DATE TEST EF   7/15 LHC  55-60 % No obs CAD  5/19 Echo   20.25 %   8/19 Echo  50-55%    Date Cr TSH  LFTs Hgb  6/19 1.13 2.75 16 13.1(5/19)  10/19  11.9    12/19 1.2 8.15 18        Records and Results Reviewed outpt labs and notes   Past Medical History:  Diagnosis Date   Anxiety    Arthritis    "knees" (09/26/2017)   Chronic diastolic CHF (congestive heart failure) ([facility])    a. Dx 08/2013 in setting of rapid afib;  b. 08/2013 Echo: EF 55-60%, mildly dil LA, nl RV.   Drug-induced torsades de pointes 10/06/2017   GERD (gastroesophageal reflux disease)    High cholesterol    History of blood transfusion 1975   "related to ectopic pregnancy   Hypertension    Non-obstructive CAD    a. 10/2013 Myoview: anteroseptal, apical, septal mild ischemia, nl LV fxn;  b. Cath: LAD 49m LCX 20ost, RCA 57m.   Obesity    OSA on CPAP    Paroxysmal atrial fibrillation ([place])    a. Dx 08/2013. CHA2DS2VASc = 3 (diast chf, htn, female) -->Xarelto initiated; b. 09/2013 s/p DCCV;  c. 10/2013 recurrent afib->Flecainide added->S/P DCCV;  d. 11/2013 Recurrent afib while off of flecainide (2/2 abnl nuc  study)-->Flecainide resumed after nl cath-->DCCV;  e. Recurrent Afib 01/2014-->Flecainide increased to 150mg  BID.   Pneumonia    "1-2 times" (09/26/2017)    Past Surgical History:  Procedure Laterality Date   BREAST BIOPSY Right 2007   stereo-neg   CARDIAC CATHETERIZATION  10/2013   [facility]   CARDIOVERSION N/A 08/29/2017   Procedure: CARDIOVERSION;  Surgeon: Wellington Hampshire, MD;  Location: [facility] ORS;  Service: Cardiovascular;  Laterality: N/A;   CARDIOVERSION     "I've had a total of 4" (09/26/2017)   CARDIOVERSION N/A 12/08/2018   Procedure: CARDIOVERSION;  Surgeon: Minna Merritts, MD;  Location: [facility] ORS;  Service: Cardiovascular;  Laterality: N/A;   ECTOPIC PREGNANCY SURGERY  1975   TONSILLECTOMY      Current Meds  Medication Sig   amiodarone (PACERONE) 200 MG tablet Take 1 tablet (200 mg total) by mouth daily. (Patient taking differently: Take 400 mg by mouth See admin instructions. Take 400 mg twice daily for 7 days, then 200 mg twice daily for 7 days, then 200 mg daily)   carvedilol (COREG) 12.5 MG tablet TAKE 1 TABLET(12.5 MG) BY MOUTH TWICE DAILY WITH A MEAL (Patient taking differently: Take 12.5 mg by mouth 2 (two) times daily with a meal. )   diclofenac sodium (VOLTAREN) 1 % GEL Apply 1 application topically  3 (three) times daily as needed (knee pain).   esomeprazole (NEXIUM) 40 MG capsule TAKE 1 CAPSULE BY MOUTH EVERY MORNING (Patient taking differently: Take 40 mg by mouth daily. )   furosemide (LASIX) 40 MG tablet TAKE 1 TABLET BY MOUTH EVERY DAY (Patient taking differently: Take 40 mg by mouth daily. )   HYDROcodone-acetaminophen (NORCO) 10-325 MG tablet Take 1 tablet by mouth every 6 (six) hours as needed for moderate pain or severe pain.    levothyroxine (SYNTHROID) 100 MCG tablet Take 100 mcg by mouth daily.    sacubitril-valsartan (ENTRESTO) 49-51 MG Take 1 tablet by mouth 2 (two) times daily.   venlafaxine (EFFEXOR) 75 MG tablet Take 75 mg by mouth 2 (two)  times daily.   XARELTO 20 MG TABS tablet TAKE 1 TABLET(20 MG) BY MOUTH DAILY WITH SUPPER (Patient taking differently: Take 20 mg by mouth daily with supper. )   zolpidem (AMBIEN) 10 MG tablet Take 10 mg by mouth at bedtime.     Allergies  Allergen Reactions   Levaquin [Levofloxacin In D5w] Other (See Comments)    Severe dizziness    Metoprolol Itching, Nausea And Vomiting, Rash and Other (See Comments)    Dizziness   Tikosyn [Dofetilide] Other (See Comments)    Developed Torsades on Tikosyn 09/28/17   Ace Inhibitors Cough      Review of Systems negative except from HPI and PMH  Physical Exam  BP (!) 144/62 (BP Location: Left Arm, Patient Position: Sitting, Cuff Size: Normal)    Pulse 71    Ht 5\' 3"  (1.6 m)    Wt 240 lb (108.9 kg)    SpO2 96%    BMI 42.51 kg/m  Well developed and Morbidly obese in no acute distress HENT normal Neck supple with JVP-flat Clear Regular rate and rhythm, no murmur Abd-soft with active BS No Clubbing cyanosis   edema Skin-warm and dry A & Oriented  Grossly normal sensory and motor function  ECG sinus @ 71   Assessment and  Plan  Cardiomyopathy-presumed nonischemic-rate related interval near normalization  CHF chronic systolic>> diastolic  Atrial fibrillation-persistent  High Risk Medication Surveillance-- Amiodarone therapy  Hypothyroidism  Iatrogenic -- treated   Treated sleep apnea   Maintaining sinus rhythm   Euvolemic continue current meds  On Anticoagulation; some bruising.  She will look into the cost of alternative DOAC's.  We will await lab work from Dr. Judithann Sheen to check her CBC.  We spent more than 50% of our >25 min visit in face to face counseling regarding the above    Current medicines are reviewed at length with the patient today .  The patient does not  have concerns regarding medicines.

## 2018-12-19 NOTE — Patient Instructions (Signed)
Medication Instructions:  - Your physician recommends that you continue on your current medications as directed. Please refer to the Current Medication list given to you today.  If you need a refill on your cardiac medications before your next appointment, please call your pharmacy.   Lab work: - none ordered  If you have labs (blood work) drawn today and your tests are completely normal, you will receive your results only by: Marland Kitchen MyChart Message (if you have MyChart) OR . A paper copy in the mail If you have any lab test that is abnormal or we need to change your treatment, we will call you to review the results.  Testing/Procedures: - none ordered  Follow-Up: At [facility], you and your health needs are our priority.  As part of our continuing mission to provide you with exceptional heart care, we have created designated Provider Care Teams.  These Care Teams include your primary Cardiologist (physician) and Advanced Practice Providers (APPs -  Physician Assistants and Nurse Practitioners) who all work together to provide you with the care you need, when you need it. . You will need a follow up appointment in 6 months (February) with Dr. Caryl Comes. Marland Kitchen Please call our office 2 months in advance to schedule this appointment. (Call in early December)    Any Other Special Instructions Will Be Listed Below (If Applicable). - N/A

## 2018-12-19 NOTE — Progress Notes (Signed)
Patient Care Team: Marguarite Arbour, MD as PCP - General (Internal Medicine) Iran Ouch, MD as PCP - Cardiology (Cardiology) Duke Salvia, MD as PCP - Electrophysiology (Cardiology)   HPI  Stacey [name] is a 68 y.o. 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    Hospitalized 6/19 for initiation of dofetilide, reverted to sinus but developed TdP an dQT prolongation. It was washed out without recurrent TdP and amiodarone initiated.  anticoagulation with Xarelto.  Bruising without bleeding.  Patient denies symptoms of GI intolerance, sun sensitivity, neurological symptoms attributable to amiodarone.     She saw Dr. Fawn Kirk 6/19 for consideration of catheter ablation and at that point elected to continue amiodarone.  Seen 8/20 with recurrent atrial fibrillation underwent DCCV  Amiodarone was uptitrated   Feels much better in sinus rhythm; turned her ankle so some R foot edema Dyspnea at pre AFib baseline No chest pain No syncope      DATE TEST EF   7/15 LHC  55-60 % No obs CAD  5/19 Echo   20.25 %   8/19 Echo  50-55%    Date Cr K TSH  LFTs Hgb  6/19 1.13  2.75 16 13.1(5/19)  10/19   11.9    12/19 1.2  8.15 18   7/20 1.4 4.4 1.83  13.0        Records and Results Reviewed outpt labs and notes   Past Medical History:  Diagnosis Date  . Anxiety   . Arthritis    "knees" (09/26/2017)  . Chronic diastolic CHF (congestive heart failure) ([facility])    a. Dx 08/2013 in setting of rapid afib;  b. 08/2013 Echo: EF 55-60%, mildly dil LA, nl RV.  Marland Kitchen Drug-induced torsades de pointes 10/06/2017  . GERD (gastroesophageal reflux disease)   . High cholesterol   . History of blood transfusion 1975   "related to ectopic pregnancy  . Hypertension   . Non-obstructive CAD    a. 10/2013 Myoview: anteroseptal, apical, septal mild ischemia, nl LV fxn;  b. Cath: LAD 52m LCX 20ost, RCA 29m.  . Obesity   . OSA on CPAP   . Paroxysmal  atrial fibrillation ([facility])    a. Dx 08/2013. CHA2DS2VASc = 3 (diast chf, htn, female) -->Xarelto initiated; b. 09/2013 s/p DCCV;  c. 10/2013 recurrent afib->Flecainide added->S/P DCCV;  d. 11/2013 Recurrent afib while off of flecainide (2/2 abnl nuc study)-->Flecainide resumed after nl cath-->DCCV;  e. Recurrent Afib 01/2014-->Flecainide increased to 150mg  BID.  Marland Kitchen Pneumonia    "1-2 times" (09/26/2017)    Past Surgical History:  Procedure Laterality Date  . BREAST BIOPSY Right 2007   stereo-neg  . CARDIAC CATHETERIZATION  10/2013   [facility]  . CARDIOVERSION N/A 08/29/2017   Procedure: CARDIOVERSION;  Surgeon: Iran Ouch, MD;  Location: [facility] OR[facility];  Service: Cardiovascular;  Laterality: N/A;  . CARDIOVERSION     "I've had a total of 4" (09/26/2017)  . CARDIOVERSION N/A 12/08/2018   Procedure: CARDIOVERSION;  Surgeon: Antonieta Iba, MD;  Location: [facility] OR[facility];  Service: Cardiovascular;  Laterality: N/A;  . ECTOPIC PREGNANCY SURGERY  1975  . TONSILLECTOMY      Current Meds  Medication Sig  . amiodarone (PACERONE) 200 MG tablet Take 1 tablet (200 mg total) by mouth daily. (Patient taking differently: Take 400 mg by mouth See admin instructions. Take 400 mg twice daily for 7 days, then 200 mg twice daily for 7 days,  then 200 mg daily)  . carvedilol (COREG) 12.5 MG tablet TAKE 1 TABLET(12.5 MG) BY MOUTH TWICE DAILY WITH A MEAL (Patient taking differently: Take 12.5 mg by mouth 2 (two) times daily with a meal. )  . diclofenac sodium (VOLTAREN) 1 % GEL Apply 1 application topically 3 (three) times daily as needed (knee pain).  Marland Kitchen esomeprazole (NEXIUM) 40 MG capsule TAKE 1 CAPSULE BY MOUTH EVERY MORNING (Patient taking differently: Take 40 mg by mouth daily. )  . furosemide (LASIX) 40 MG tablet TAKE 1 TABLET BY MOUTH EVERY DAY (Patient taking differently: Take 40 mg by mouth daily. )  . HYDROcodone-acetaminophen (NORCO) 10-325 MG tablet Take 1 tablet by mouth every 6 (six) hours as needed for moderate pain or  severe pain.   Marland Kitchen levothyroxine (SYNTHROID) 100 MCG tablet Take 100 mcg by mouth daily.   . sacubitril-valsartan (ENTRESTO) 49-51 MG Take 1 tablet by mouth 2 (two) times daily.  Marland Kitchen venlafaxine (EFFEXOR) 75 MG tablet Take 75 mg by mouth 2 (two) times daily.  Alveda Reasons 20 MG TABS tablet TAKE 1 TABLET(20 MG) BY MOUTH DAILY WITH SUPPER (Patient taking differently: Take 20 mg by mouth daily with supper. )  . zolpidem (AMBIEN) 10 MG tablet Take 10 mg by mouth at bedtime.     Allergies  Allergen Reactions  . Levaquin [Levofloxacin In D5w] Other (See Comments)    Severe dizziness   . Metoprolol Itching, Nausea And Vomiting, Rash and Other (See Comments)    Dizziness  . Tikosyn [Dofetilide] Other (See Comments)    Developed Torsades on Tikosyn 09/28/17  . Ace Inhibitors Cough      Review of Systems negative except from HPI and PMH  Physical Exam  BP (!) 144/62 (BP Location: Left Arm, Patient Position: Sitting, Cuff Size: Normal)   Pulse 71   Ht 5\' 3"  (1.6 m)   Wt 240 lb (108.9 kg)   SpO2 96%   BMI 42.51 kg/m  Well developed and Morbidly obese in no acute distress HENT normal Neck supple   Clear Regular rate and rhythm, no  murmur Abd-soft with active BS No Clubbing cyanosis  R>L 1+  edema Skin-warm and dry A & Oriented  Grossly normal sensory and motor function  ECG sinus @ 69 14/09/46   Assessment and  Plan  Cardiomyopathy-presumed nonischemic-rate related interval near normalization  CHF chronic systolic>> diastolic  Atrial fibrillation-persistent  High Risk Medication Surveillance-- Amiodarone therapy  Hypothyroidism  Iatrogenic -- treated   Treated sleep apnea  Macxrocytosis   Back in sinus rhythm; reviewed with her that recurrent afib is certain, and that serial DCCV is a reasonable strategy without the need to rebolus amiodarone unless the frequency becomes great  Euvolemic continue current meds  On Anticoagulation;  No bleeding issues x bruising  Labs  show interval macrocytosis without anemia  Made pt aware and will forward to Dr Doy Hutching for his direction       Current medicines are reviewed at length with the patient today .  The patient does not  have concerns regarding medicines.

## 2018-12-28 ENCOUNTER — Ambulatory Visit: Payer: Medicare Other | Admitting: Nurse Practitioner

## 2019-01-01 ENCOUNTER — Other Ambulatory Visit: Payer: Self-pay | Admitting: Internal Medicine

## 2019-02-10 ENCOUNTER — Other Ambulatory Visit: Payer: Self-pay | Admitting: Internal Medicine

## 2019-02-12 NOTE — Telephone Encounter (Signed)
Xarelto 20mg  refill request received. Pt is 68 years old, weight-108.9kg, Crea-1.40 on 11/13/18 via [facility] from [place], last seen by Dr.Klein on 12/19/2018, Diagnosis-Afib, CrCl-67.32ml/min; Dose is appropriate based on dosing criteria. Will send in refill to requested pharmacy.

## 2019-02-13 ENCOUNTER — Telehealth: Payer: Self-pay

## 2019-02-13 MED ORDER — FUROSEMIDE 40 MG PO TABS: 40.0000 mg | ORAL_TABLET | Freq: Every day | ORAL | 0 refills | Status: DC

## 2019-02-13 NOTE — Telephone Encounter (Signed)
Requested Prescriptions   Signed Prescriptions Disp Refills  . furosemide (LASIX) 40 MG tablet 30 tablet 0    Sig: Take 1 tablet (40 mg total) by mouth daily.    Authorizing Provider: Kathlyn Sacramento A    Ordering User: Raelene Bott, Tyeler Goedken L

## 2019-04-02 ENCOUNTER — Other Ambulatory Visit: Payer: Self-pay | Admitting: *Deleted

## 2019-04-02 MED ORDER — FUROSEMIDE 40 MG PO TABS: 40.0000 mg | ORAL_TABLET | Freq: Every day | ORAL | 0 refills | Status: DC

## 2019-04-10 ENCOUNTER — Other Ambulatory Visit: Payer: Self-pay | Admitting: Internal Medicine

## 2019-04-21 ENCOUNTER — Other Ambulatory Visit: Payer: Self-pay | Admitting: Internal Medicine

## 2019-05-07 ENCOUNTER — Other Ambulatory Visit: Payer: Self-pay

## 2019-05-07 MED ORDER — FUROSEMIDE 40 MG PO TABS: 40.0000 mg | ORAL_TABLET | Freq: Every day | ORAL | 0 refills | Status: DC

## 2019-05-09 ENCOUNTER — Telehealth: Payer: Self-pay

## 2019-05-09 NOTE — Telephone Encounter (Signed)
Per fax from [facility], Xarelto is no longer on formulary for [place] [employer] [place]. No alternatives recommended. Last filled on 05/03/2019 for a temporary 30 day supply. Formulary drug's can be found on plan's website per fax.

## 2019-05-10 NOTE — Telephone Encounter (Signed)
Brandy, CMA printed off the patient's preferred drug list for coagulation therapy.  Eliquis (30 day supply) is Tier 3 Pradaxa (30 day supply) is Tier 4 Warfarin (30 day supply) is Tier 1   Will forward to Dr. Graciela Husbands to review- assuming the recommendation will be to offer the patient a switch to Eliquis 5 mg BID.

## 2019-05-11 NOTE — Telephone Encounter (Signed)
Agree with  apixoban 5 bid  Heather Thanks SK

## 2019-05-18 MED ORDER — APIXABAN 5 MG PO TABS: 5.0000 mg | ORAL_TABLET | Freq: Two times a day (BID) | ORAL | 6 refills | Status: DC

## 2019-05-18 NOTE — Addendum Note (Signed)
Addended by: Sherri Rad C on: 05/18/2019 12:47 PM   Modules accepted: Orders

## 2019-05-18 NOTE — Telephone Encounter (Addendum)
I spoke with the patient. She was unaware that her insurance was not covering Xarelto this year. I advised her that we looked at the website: [email or web address] as instructed by the fax we received from her plan dated 05/04/19. She is aware that Eliquis was on her list of preferred drugs and we can switch her to that if she is agreeable.  The patient voices understanding and is agreeable.   I advised she could finish out her current supply of Xarelto and then start Eliquis 5 mg BID the next day after taking her last dose of Xarelto. She voices understanding of this plan.

## 2019-05-18 NOTE — Telephone Encounter (Signed)
I attempted to call the patient to advise her we had received notification from her insurance company that Xarelto would not be covered for her this year.  No answer- I left a detailed message on her identified voice mail as to why I was calling and Dr. Odessa Fleming recommendations to change her to eliquis 5 mg BID if she was agreeable.  I asked that she call back to discuss this further prior to a new RX being sent to her pharmacy.

## 2019-06-11 ENCOUNTER — Other Ambulatory Visit: Payer: Self-pay

## 2019-06-11 MED ORDER — FUROSEMIDE 40 MG PO TABS: 40.0000 mg | ORAL_TABLET | Freq: Every day | ORAL | 0 refills | Status: DC

## 2019-06-21 ENCOUNTER — Encounter: Payer: Self-pay | Admitting: Cardiovascular Disease

## 2019-06-21 ENCOUNTER — Ambulatory Visit (INDEPENDENT_AMBULATORY_CARE_PROVIDER_SITE_OTHER): Payer: Medicare Other | Admitting: Cardiovascular Disease

## 2019-06-21 ENCOUNTER — Other Ambulatory Visit: Payer: Self-pay

## 2019-06-21 VITALS — BP 110/60 | HR 74 | Ht 63.0 in | Wt 241.8 lb

## 2019-06-21 DIAGNOSIS — I10 Essential (primary) hypertension: Secondary | ICD-10-CM | POA: Diagnosis not present

## 2019-06-21 DIAGNOSIS — I4819 Other persistent atrial fibrillation: Secondary | ICD-10-CM | POA: Diagnosis not present

## 2019-06-21 DIAGNOSIS — Z79899 Other long term (current) drug therapy: Secondary | ICD-10-CM

## 2019-06-21 DIAGNOSIS — I5022 Chronic systolic (congestive) heart failure: Secondary | ICD-10-CM | POA: Diagnosis not present

## 2019-06-21 NOTE — Progress Notes (Signed)
Cardiology Office Note   Date:  06/21/2019   ID:  Stacey [name], DOB 09-08-1950, MRN [id number]  PCP:  Marguarite Arbour, MD  Cardiologist:   [name], MD   Chief Complaint  Patient presents with  . office visit    6 month F/U; Meds verbally reviewed with patient.      History of Present Illness: Stacey [name] is a 69 y.o. female who presents for a follow-up visit regarding persistent atrial fibrillation and chronic diastolic heart failure. She has known history of  HTN and depression.   Cardiac cath in 2015 showed mild nonobstructive coronary artery disease. She uses a CPAP regularly for sleep apnea. She failed multiple antiarrhythmic medications including flecainide.  Dofetilide was associated with QT prolongation and had to be discontinued.  She has been maintaining sinus rhythm with amiodarone.  Most recent cardioversion was July of last year but overall she has been doing well with no recent chest pain, palpitations or shortness of breath.  She takes her medications regularly. She did have decreased ejection fraction in the setting of A. fib with RVR but subsequently her EF improved to 50 to 55%.   Past Medical History:  Diagnosis Date  . Anxiety   . Arthritis    "knees" (09/26/2017)  . Chronic diastolic CHF (congestive heart failure) ([facility])    a. Dx 08/2013 in setting of rapid afib;  b. 08/2013 Echo: EF 55-60%, mildly dil LA, nl RV.  Marland Kitchen Drug-induced torsades de pointes 10/06/2017  . GERD (gastroesophageal reflux disease)   . High cholesterol   . History of blood transfusion 1975   "related to ectopic pregnancy  . Hypertension   . Non-obstructive CAD    a. 10/2013 Myoview: anteroseptal, apical, septal mild ischemia, nl LV fxn;  b. Cath: LAD 23m LCX 20ost, RCA 31m.  . Obesity   . OSA on CPAP   . Paroxysmal atrial fibrillation ([facility])    a. Dx 08/2013. CHA2DS2VASc = 3 (diast chf, htn, female) -->Xarelto initiated; b. 09/2013 s/p DCCV;  c. 10/2013 recurrent  afib->Flecainide added->S/P DCCV;  d. 11/2013 Recurrent afib while off of flecainide (2/2 abnl nuc study)-->Flecainide resumed after nl cath-->DCCV;  e. Recurrent Afib 01/2014-->Flecainide increased to 150mg  BID.  Pneumonia    "1-2 times" (09/26/2017)    Past Surgical History:  Procedure Laterality Date  . BREAST BIOPSY Right 2007   stereo-neg  . CARDIAC CATHETERIZATION  10/2013   [facility]  . CARDIOVERSION N/A 08/29/2017   Procedure: CARDIOVERSION;  Surgeon: 10/29/2017, MD;  Location: [facility] ORS;  Service: Cardiovascular;  Laterality: N/A;  . CARDIOVERSION     "I've had a total of 4" (09/26/2017)  . CARDIOVERSION N/A 12/08/2018   Procedure: CARDIOVERSION;  Surgeon: 12/10/2018, MD;  Location: [facility];  Service: Cardiovascular;  Laterality: N/A;  . ECTOPIC PREGNANCY SURGERY  1975  . TONSILLECTOMY       Current Outpatient Medications  Medication Sig Dispense Refill  . amiodarone (PACERONE) 200 MG tablet TAKE 1 TABLET(200 MG) BY MOUTH DAILY 30 tablet 10  . apixaban (ELIQUIS) 5 MG TABS tablet Take 1 tablet (5 mg total) by mouth 2 (two) times daily. 60 tablet 6  . carvedilol (COREG) 12.5 MG tablet TAKE 1 TABLET(12.5 MG) BY MOUTH TWICE DAILY WITH A MEAL 180 tablet 2  . diclofenac sodium (VOLTAREN) 1 % GEL Apply 1 application topically 3 (three) times daily as needed (knee pain).    Antonieta Iba ENTRESTO 49-51 MG TAKE 1 TABLET BY  MOUTH TWICE DAILY 180 tablet 2  . esomeprazole (NEXIUM) 40 MG capsule TAKE 1 CAPSULE BY MOUTH EVERY MORNING (Patient taking differently: Take 40 mg by mouth daily. ) 30 capsule 3  . furosemide (LASIX) 40 MG tablet Take 1 tablet (40 mg total) by mouth daily. Please schedule office visit for further refills. Thank you! 2ND attempt. 30 tablet 0  . HYDROcodone-acetaminophen (NORCO) 10-325 MG tablet Take 1 tablet by mouth every 6 (six) hours as needed for moderate pain or severe pain.   0  . levothyroxine (SYNTHROID) 100 MCG tablet Take 100 mcg by mouth daily.     Marland Kitchen venlafaxine  (EFFEXOR) 75 MG tablet Take 75 mg by mouth 2 (two) times daily.    Marland Kitchen zolpidem (AMBIEN) 10 MG tablet Take 10 mg by mouth at bedtime.      No current facility-administered medications for this visit.    Allergies:   Levaquin [levofloxacin in d5w], Metoprolol, Tikosyn [dofetilide], and Ace inhibitors    Social History:  The patient  reports that she has never smoked. She has never used smokeless tobacco. She reports current alcohol use of about 7.0 standard drinks of alcohol per week. She reports that she does not use drugs.   Family History:  The patient's family history includes Breast cancer (age of onset: 28) in her mother; CAD in her father; Heart attack in her father; Hypertension in her mother.    ROS:  Please see the history of present illness.   Otherwise, review of systems are positive for none.   All other systems are reviewed and negative.    PHYSICAL EXAM: VS:  BP 110/60 (BP Location: Left Arm, Patient Position: Sitting, Cuff Size: Large)   Pulse 74   Ht 5\' 3"  (1.6 m)   Wt 241 lb 12 oz (109.7 kg)   SpO2 92%   BMI 42.82 kg/m  , BMI Body mass index is 42.82 kg/m. GEN: Well nourished, well developed, in no acute distress  HEENT: normal  Neck: no JVD, carotid bruits, or masses Cardiac: Regular rate and rhythm; no  rubs, or gallops,no edema . 1/6 systolic ejection murmur in the aortic area Respiratory:  clear to auscultation bilaterally, normal work of breathing GI: soft, nontender, nondistended, + BS MS: no deformity or atrophy  Skin: warm and dry, no rash Neuro:  Strength and sensation are intact Psych: euthymic mood, full affect   EKG:  EKG is ordered today. The ekg ordered today demonstrates normal sinus rhythm with low voltage and nonspecific T wave changes.  QTC is mildly prolonged at 488 ms.  Recent Labs: No results found for requested labs within last 8760 hours.    Lipid Panel    Component Value Date/Time   CHOL 194 08/28/2013 0352   TRIG 122 08/28/2013  0352   HDL 56 08/28/2013 0352   VLDL 24 08/28/2013 0352   LDLCALC 114 (H) 08/28/2013 0352      Wt Readings from Last 3 Encounters:  06/21/19 241 lb 12 oz (109.7 kg)  12/19/18 240 lb (108.9 kg)  12/08/18 248 lb 0.3 oz (112.5 kg)         ASSESSMENT AND PLAN:  1.  Persistent atrial fibrillation: Maintaining in sinus rhythm with amiodarone 200 mg once daily.  She is tolerating anticoagulation with Eliquis with no bleeding side effects.  She reports less bruising with Eliquis and Xarelto which she was on before.    2. Chronic systolic/diastolic heart failure: Low EF in the past in the setting  of atrial fibrillation improved to low normal at 50 to 55%.  Continue treatment with carvedilol and Entresto.  She appears to be euvolemic.  3. Obstructive sleep apnea: Doing well with CPAP.  4.  Essential hypertension: Blood pressure is controlled.  5.  Monitoring of high risk medication: She is on amiodarone.  I reviewed her labs in October which showed normal TSH and liver function.  She has no pulmonary symptoms.   Disposition:   FU with me in 12 months  Signed,  [name], MD  06/21/2019 3:14 PM    [facility]

## 2019-06-21 NOTE — Patient Instructions (Signed)
Medication Instructions:  Your physician recommends that you continue on your current medications as directed. Please refer to the Current Medication list given to you today.  *If you need a refill on your cardiac medications before your next appointment, please call your pharmacy*  Lab Work: None ordered If you have labs (blood work) drawn today and your tests are completely normal, you will receive your results only by: . MyChart Message (if you have MyChart) OR . A paper copy in the mail If you have any lab test that is abnormal or we need to change your treatment, we will call you to review the results.  Testing/Procedures: None ordered  Follow-Up: At [facility], you and your health needs are our priority.  As part of our continuing mission to provide you with exceptional heart care, we have created designated Provider Care Teams.  These Care Teams include your primary Cardiologist (physician) and Advanced Practice Providers (APPs -  Physician Assistants and Nurse Practitioners) who all work together to provide you with the care you need, when you need it.  Your next appointment:   12 month(s)  The format for your next appointment:   In Person  Provider:    You may see Muhammad Arida, MD or one of the following Advanced Practice Providers on your designated Care Team:    Christopher Berge, NP  Ryan Dunn, PA-C  Jacquelyn Visser, PA-C   Other Instructions N/A  

## 2019-06-22 NOTE — Addendum Note (Signed)
Addended by: Thayer Headings, Lamiyah Schlotter L on: 06/22/2019 04:13 PM   Modules accepted: Orders

## 2019-07-09 ENCOUNTER — Other Ambulatory Visit: Payer: Self-pay | Admitting: Cardiovascular Disease

## 2019-09-18 ENCOUNTER — Other Ambulatory Visit: Payer: Self-pay | Admitting: Internal Medicine

## 2019-09-18 DIAGNOSIS — Z1231 Encounter for screening mammogram for malignant neoplasm of breast: Secondary | ICD-10-CM

## 2019-10-01 ENCOUNTER — Encounter
Admission: EM | Disposition: A | Discharge status: Skilled Nursing Facility | Payer: Self-pay | Source: Home / Self Care | Attending: Hospitalist

## 2019-10-01 ENCOUNTER — Emergency Department: Payer: Medicare Other

## 2019-10-01 ENCOUNTER — Inpatient Hospital Stay: Payer: Medicare Other | Admitting: Anesthesiology

## 2019-10-01 ENCOUNTER — Other Ambulatory Visit: Payer: Self-pay

## 2019-10-01 ENCOUNTER — Inpatient Hospital Stay: Payer: Medicare Other

## 2019-10-01 ENCOUNTER — Inpatient Hospital Stay
Admission: EM | Admit: 2019-10-01 | Discharge: 2019-10-04 | DRG: 493 | Disposition: A | Discharge status: Skilled Nursing Facility | Payer: Medicare Other | Attending: Hospitalist | Admitting: Hospitalist

## 2019-10-01 DIAGNOSIS — N178 Other acute kidney failure: Secondary | ICD-10-CM | POA: Diagnosis not present

## 2019-10-01 DIAGNOSIS — I5032 Chronic diastolic (congestive) heart failure: Secondary | ICD-10-CM | POA: Diagnosis present

## 2019-10-01 DIAGNOSIS — I10 Essential (primary) hypertension: Secondary | ICD-10-CM | POA: Diagnosis not present

## 2019-10-01 DIAGNOSIS — W010XXA Fall on same level from slipping, tripping and stumbling without subsequent striking against object, initial encounter: Secondary | ICD-10-CM | POA: Diagnosis present

## 2019-10-01 DIAGNOSIS — Z79899 Other long term (current) drug therapy: Secondary | ICD-10-CM

## 2019-10-01 DIAGNOSIS — I420 Dilated cardiomyopathy: Secondary | ICD-10-CM | POA: Diagnosis present

## 2019-10-01 DIAGNOSIS — I130 Hypertensive heart and chronic kidney disease with heart failure and stage 1 through stage 4 chronic kidney disease, or unspecified chronic kidney disease: Secondary | ICD-10-CM | POA: Diagnosis present

## 2019-10-01 DIAGNOSIS — N1831 Chronic kidney disease, stage 3a: Secondary | ICD-10-CM | POA: Diagnosis present

## 2019-10-01 DIAGNOSIS — Z7289 Other problems related to lifestyle: Secondary | ICD-10-CM

## 2019-10-01 DIAGNOSIS — D631 Anemia in chronic kidney disease: Secondary | ICD-10-CM | POA: Diagnosis present

## 2019-10-01 DIAGNOSIS — S82831A Other fracture of upper and lower end of right fibula, initial encounter for closed fracture: Secondary | ICD-10-CM | POA: Diagnosis not present

## 2019-10-01 DIAGNOSIS — K219 Gastro-esophageal reflux disease without esophagitis: Secondary | ICD-10-CM | POA: Diagnosis present

## 2019-10-01 DIAGNOSIS — Z7901 Long term (current) use of anticoagulants: Secondary | ICD-10-CM | POA: Diagnosis not present

## 2019-10-01 DIAGNOSIS — G4730 Sleep apnea, unspecified: Secondary | ICD-10-CM | POA: Diagnosis not present

## 2019-10-01 DIAGNOSIS — G4733 Obstructive sleep apnea (adult) (pediatric): Secondary | ICD-10-CM | POA: Diagnosis present

## 2019-10-01 DIAGNOSIS — I4891 Unspecified atrial fibrillation: Secondary | ICD-10-CM | POA: Diagnosis not present

## 2019-10-01 DIAGNOSIS — M170 Bilateral primary osteoarthritis of knee: Secondary | ICD-10-CM | POA: Diagnosis present

## 2019-10-01 DIAGNOSIS — Z419 Encounter for procedure for purposes other than remedying health state, unspecified: Secondary | ICD-10-CM

## 2019-10-01 DIAGNOSIS — N179 Acute kidney failure, unspecified: Secondary | ICD-10-CM | POA: Diagnosis present

## 2019-10-01 DIAGNOSIS — F419 Anxiety disorder, unspecified: Secondary | ICD-10-CM | POA: Diagnosis present

## 2019-10-01 DIAGNOSIS — S9301XA Subluxation of right ankle joint, initial encounter: Secondary | ICD-10-CM | POA: Diagnosis present

## 2019-10-01 DIAGNOSIS — E039 Hypothyroidism, unspecified: Secondary | ICD-10-CM | POA: Diagnosis present

## 2019-10-01 DIAGNOSIS — E876 Hypokalemia: Secondary | ICD-10-CM | POA: Diagnosis present

## 2019-10-01 DIAGNOSIS — I2510 Atherosclerotic heart disease of native coronary artery without angina pectoris: Secondary | ICD-10-CM | POA: Diagnosis present

## 2019-10-01 DIAGNOSIS — D649 Anemia, unspecified: Secondary | ICD-10-CM | POA: Diagnosis present

## 2019-10-01 DIAGNOSIS — Z20822 Contact with and (suspected) exposure to covid-19: Secondary | ICD-10-CM | POA: Diagnosis present

## 2019-10-01 DIAGNOSIS — Z881 Allergy status to other antibiotic agents status: Secondary | ICD-10-CM

## 2019-10-01 DIAGNOSIS — Z01810 Encounter for preprocedural cardiovascular examination: Secondary | ICD-10-CM

## 2019-10-01 DIAGNOSIS — I4819 Other persistent atrial fibrillation: Secondary | ICD-10-CM | POA: Diagnosis present

## 2019-10-01 DIAGNOSIS — Z79890 Hormone replacement therapy (postmenopausal): Secondary | ICD-10-CM | POA: Diagnosis not present

## 2019-10-01 DIAGNOSIS — S82431A Displaced oblique fracture of shaft of right fibula, initial encounter for closed fracture: Principal | ICD-10-CM | POA: Diagnosis present

## 2019-10-01 DIAGNOSIS — F329 Major depressive disorder, single episode, unspecified: Secondary | ICD-10-CM | POA: Diagnosis present

## 2019-10-01 DIAGNOSIS — N183 Chronic kidney disease, stage 3 unspecified: Secondary | ICD-10-CM | POA: Diagnosis present

## 2019-10-01 DIAGNOSIS — Z23 Encounter for immunization: Secondary | ICD-10-CM

## 2019-10-01 DIAGNOSIS — Z6841 Body Mass Index (BMI) 40.0 and over, adult: Secondary | ICD-10-CM | POA: Diagnosis not present

## 2019-10-01 DIAGNOSIS — Z8249 Family history of ischemic heart disease and other diseases of the circulatory system: Secondary | ICD-10-CM

## 2019-10-01 DIAGNOSIS — I480 Paroxysmal atrial fibrillation: Secondary | ICD-10-CM

## 2019-10-01 DIAGNOSIS — Z888 Allergy status to other drugs, medicaments and biological substances status: Secondary | ICD-10-CM

## 2019-10-01 HISTORY — PX: ORIF ANKLE FRACTURE: SHX5408

## 2019-10-01 LAB — CBC WITH DIFFERENTIAL/PLATELET
Abs Immature Granulocytes: 0.02 10*3/uL (ref 0.00–0.07)
Basophils Absolute: 0 10*3/uL (ref 0.0–0.1)
Basophils Relative: 1 %
Eosinophils Absolute: 0.2 10*3/uL (ref 0.0–0.5)
Eosinophils Relative: 3 %
HCT: 31.5 % — ABNORMAL LOW (ref 36.0–46.0)
Hemoglobin: 9.9 g/dL — ABNORMAL LOW (ref 12.0–15.0)
Immature Granulocytes: 0 %
Lymphocytes Relative: 18 %
Lymphs Abs: 1.2 10*3/uL (ref 0.7–4.0)
MCH: 30.3 pg (ref 26.0–34.0)
MCHC: 31.4 g/dL (ref 30.0–36.0)
MCV: 96.3 fL (ref 80.0–100.0)
Monocytes Absolute: 0.5 10*3/uL (ref 0.1–1.0)
Monocytes Relative: 8 %
Neutro Abs: 4.4 10*3/uL (ref 1.7–7.7)
Neutrophils Relative %: 70 %
Platelets: 215 10*3/uL (ref 150–400)
RBC: 3.27 MIL/uL — ABNORMAL LOW (ref 3.87–5.11)
RDW: 16.1 % — ABNORMAL HIGH (ref 11.5–15.5)
WBC: 6.3 10*3/uL (ref 4.0–10.5)
nRBC: 0 % (ref 0.0–0.2)

## 2019-10-01 LAB — PROTIME-INR
INR: 1.4 — ABNORMAL HIGH (ref 0.8–1.2)
Prothrombin Time: 16.9 seconds — ABNORMAL HIGH (ref 11.4–15.2)

## 2019-10-01 LAB — BASIC METABOLIC PANEL
Anion gap: 13 (ref 5–15)
BUN: 23 mg/dL (ref 8–23)
CO2: 25 mmol/L (ref 22–32)
Calcium: 8.4 mg/dL — ABNORMAL LOW (ref 8.9–10.3)
Chloride: 104 mmol/L (ref 98–111)
Creatinine, Ser: 1.56 mg/dL — ABNORMAL HIGH (ref 0.44–1.00)
GFR calc Af Amer: 39 mL/min — ABNORMAL LOW (ref >60)
GFR calc non Af Amer: 34 mL/min — ABNORMAL LOW (ref >60)
Glucose, Bld: 96 mg/dL (ref 70–99)
Potassium: 3.3 mmol/L — ABNORMAL LOW (ref 3.5–5.1)
Sodium: 142 mmol/L (ref 135–145)

## 2019-10-01 LAB — TYPE AND SCREEN
ABO/RH(D): A POS
Antibody Screen: NEGATIVE

## 2019-10-01 LAB — SARS CORONAVIRUS 2 BY RT PCR (HOSPITAL ORDER, PERFORMED IN ~~LOC~~ HOSPITAL LAB): SARS Coronavirus 2: NEGATIVE

## 2019-10-01 LAB — BRAIN NATRIURETIC PEPTIDE: B Natriuretic Peptide: 328.1 pg/mL — ABNORMAL HIGH (ref 0.0–100.0)

## 2019-10-01 LAB — APTT: aPTT: 32 seconds (ref 24–36)

## 2019-10-01 LAB — MAGNESIUM: Magnesium: 1.6 mg/dL — ABNORMAL LOW (ref 1.7–2.4)

## 2019-10-01 SURGERY — OPEN REDUCTION INTERNAL FIXATION (ORIF) ANKLE FRACTURE
Anesthesia: General | Site: Ankle | Laterality: Right

## 2019-10-01 MED ORDER — ZOLPIDEM TARTRATE 5 MG PO TABS
5.0000 mg | ORAL_TABLET | Freq: Every evening | ORAL | Status: DC | PRN
  Administered 2019-10-02 – 2019-10-03 (×3): 5 mg via ORAL
  Filled 2019-10-01 (×3): qty 1

## 2019-10-01 MED ORDER — TETANUS-DIPHTH-ACELL PERTUSSIS 5-2.5-18.5 LF-MCG/0.5 IM SUSP
0.5000 mL | Freq: Once | INTRAMUSCULAR | Status: AC
  Administered 2019-10-01: 0.5 mL via INTRAMUSCULAR
  Filled 2019-10-01: qty 0.5

## 2019-10-01 MED ORDER — ROCURONIUM BROMIDE 10 MG/ML (PF) SYRINGE
PREFILLED_SYRINGE | INTRAVENOUS | Status: AC
  Filled 2019-10-01: qty 10

## 2019-10-01 MED ORDER — ONDANSETRON HCL 4 MG/2ML IJ SOLN
4.0000 mg | Freq: Once | INTRAMUSCULAR | Status: AC
  Administered 2019-10-01: 4 mg via INTRAVENOUS
  Filled 2019-10-01: qty 2

## 2019-10-01 MED ORDER — ONDANSETRON HCL 4 MG/2ML IJ SOLN: 4.0000 mg | Freq: Four times a day (QID) | INTRAMUSCULAR | Status: DC | PRN

## 2019-10-01 MED ORDER — MORPHINE SULFATE (PF) 2 MG/ML IV SOLN
1.0000 mg | INTRAVENOUS | Status: DC | PRN
  Administered 2019-10-01: 1 mg via INTRAVENOUS
  Filled 2019-10-01: qty 1

## 2019-10-01 MED ORDER — ROCURONIUM BROMIDE 100 MG/10ML IV SOLN
INTRAVENOUS | Status: DC | PRN
  Administered 2019-10-01: 30 mg via INTRAVENOUS
  Administered 2019-10-01: 10 mg via INTRAVENOUS

## 2019-10-01 MED ORDER — ACETAMINOPHEN 325 MG PO TABS: 650.0000 mg | ORAL_TABLET | Freq: Four times a day (QID) | ORAL | Status: DC | PRN

## 2019-10-01 MED ORDER — ONDANSETRON HCL 4 MG PO TABS: 4.0000 mg | ORAL_TABLET | Freq: Four times a day (QID) | ORAL | Status: DC | PRN

## 2019-10-01 MED ORDER — MAGNESIUM SULFATE 2 GM/50ML IV SOLN
2.0000 g | Freq: Once | INTRAVENOUS | Status: AC
  Administered 2019-10-01: 2 g via INTRAVENOUS
  Filled 2019-10-01: qty 50

## 2019-10-01 MED ORDER — AMIODARONE HCL 200 MG PO TABS
200.0000 mg | ORAL_TABLET | Freq: Every day | ORAL | Status: DC
  Administered 2019-10-01 – 2019-10-04 (×3): 200 mg via ORAL
  Filled 2019-10-01 (×4): qty 1

## 2019-10-01 MED ORDER — CEFAZOLIN SODIUM-DEXTROSE 2-4 GM/100ML-% IV SOLN
INTRAVENOUS | Status: AC
  Filled 2019-10-01: qty 100

## 2019-10-01 MED ORDER — SUGAMMADEX SODIUM 200 MG/2ML IV SOLN
INTRAVENOUS | Status: DC | PRN
  Administered 2019-10-01: 200 mg via INTRAVENOUS

## 2019-10-01 MED ORDER — LIDOCAINE HCL (PF) 2 % IJ SOLN
INTRAMUSCULAR | Status: AC
  Filled 2019-10-01: qty 5

## 2019-10-01 MED ORDER — FENTANYL CITRATE (PF) 100 MCG/2ML IJ SOLN
INTRAMUSCULAR | Status: AC
  Administered 2019-10-01: 25 ug via INTRAVENOUS
  Filled 2019-10-01: qty 2

## 2019-10-01 MED ORDER — MORPHINE SULFATE (PF) 2 MG/ML IV SOLN
2.0000 mg | INTRAVENOUS | Status: DC | PRN
  Administered 2019-10-01: 2 mg via INTRAVENOUS
  Filled 2019-10-01: qty 1

## 2019-10-01 MED ORDER — OXYCODONE HCL 5 MG PO TABS
5.0000 mg | ORAL_TABLET | ORAL | Status: DC | PRN
  Administered 2019-10-01: 10 mg via ORAL
  Administered 2019-10-01: 5 mg via ORAL
  Administered 2019-10-02 – 2019-10-03 (×6): 10 mg via ORAL
  Administered 2019-10-03 (×2): 5 mg via ORAL
  Administered 2019-10-04 (×2): 10 mg via ORAL
  Filled 2019-10-01: qty 2
  Filled 2019-10-01: qty 1
  Filled 2019-10-01: qty 2
  Filled 2019-10-01: qty 1
  Filled 2019-10-01 (×2): qty 2
  Filled 2019-10-01: qty 1
  Filled 2019-10-01 (×5): qty 2

## 2019-10-01 MED ORDER — FENTANYL CITRATE (PF) 100 MCG/2ML IJ SOLN
INTRAMUSCULAR | Status: AC
  Filled 2019-10-01: qty 2

## 2019-10-01 MED ORDER — EPHEDRINE 5 MG/ML INJ
INTRAVENOUS | Status: AC
  Filled 2019-10-01: qty 10

## 2019-10-01 MED ORDER — ACETAMINOPHEN 10 MG/ML IV SOLN
INTRAVENOUS | Status: DC | PRN
  Administered 2019-10-01: 1000 mg via INTRAVENOUS

## 2019-10-01 MED ORDER — FENTANYL CITRATE (PF) 100 MCG/2ML IJ SOLN
100.0000 ug | Freq: Once | INTRAMUSCULAR | Status: AC
  Administered 2019-10-01: 100 ug via INTRAVENOUS

## 2019-10-01 MED ORDER — MORPHINE SULFATE (PF) 4 MG/ML IV SOLN
4.0000 mg | Freq: Once | INTRAVENOUS | Status: DC
  Filled 2019-10-01: qty 1

## 2019-10-01 MED ORDER — MORPHINE SULFATE (PF) 4 MG/ML IV SOLN
4.0000 mg | Freq: Once | INTRAVENOUS | Status: AC
  Administered 2019-10-01: 4 mg via INTRAVENOUS

## 2019-10-01 MED ORDER — EPHEDRINE SULFATE 50 MG/ML IJ SOLN
INTRAMUSCULAR | Status: DC | PRN
  Administered 2019-10-01 (×4): 10 mg via INTRAVENOUS

## 2019-10-01 MED ORDER — MAGNESIUM SULFATE 2 GM/50ML IV SOLN: 2.0000 g | Freq: Once | INTRAVENOUS | Status: DC

## 2019-10-01 MED ORDER — FENTANYL CITRATE (PF) 100 MCG/2ML IJ SOLN
100.0000 ug | Freq: Once | INTRAMUSCULAR | Status: AC
  Administered 2019-10-01: 100 ug via INTRAVENOUS
  Filled 2019-10-01: qty 2

## 2019-10-01 MED ORDER — MIDAZOLAM HCL 2 MG/2ML IJ SOLN
INTRAMUSCULAR | Status: DC | PRN
  Administered 2019-10-01: 2 mg via INTRAVENOUS

## 2019-10-01 MED ORDER — DOCUSATE SODIUM 100 MG PO CAPS
100.0000 mg | ORAL_CAPSULE | Freq: Two times a day (BID) | ORAL | Status: DC
  Administered 2019-10-01 – 2019-10-04 (×4): 100 mg via ORAL
  Filled 2019-10-01 (×5): qty 1

## 2019-10-01 MED ORDER — APIXABAN 5 MG PO TABS: 5.0000 mg | ORAL_TABLET | Freq: Two times a day (BID) | ORAL | Status: DC

## 2019-10-01 MED ORDER — LIDOCAINE HCL (CARDIAC) PF 100 MG/5ML IV SOSY
PREFILLED_SYRINGE | INTRAVENOUS | Status: DC | PRN
  Administered 2019-10-01: 80 mg via INTRAVENOUS

## 2019-10-01 MED ORDER — ACETAMINOPHEN 500 MG PO TABS
1000.0000 mg | ORAL_TABLET | Freq: Four times a day (QID) | ORAL | Status: AC
  Administered 2019-10-01 – 2019-10-02 (×4): 1000 mg via ORAL
  Filled 2019-10-01 (×4): qty 2

## 2019-10-01 MED ORDER — SUCCINYLCHOLINE CHLORIDE 20 MG/ML IJ SOLN
INTRAMUSCULAR | Status: DC | PRN
  Administered 2019-10-01: 120 mg via INTRAVENOUS

## 2019-10-01 MED ORDER — MEPERIDINE HCL 50 MG/ML IJ SOLN: 6.2500 mg | INTRAMUSCULAR | Status: DC | PRN

## 2019-10-01 MED ORDER — METHOCARBAMOL 500 MG PO TABS
500.0000 mg | ORAL_TABLET | Freq: Three times a day (TID) | ORAL | Status: DC | PRN
  Filled 2019-10-01: qty 1

## 2019-10-01 MED ORDER — POTASSIUM CHLORIDE CRYS ER 20 MEQ PO TBCR
40.0000 meq | EXTENDED_RELEASE_TABLET | Freq: Once | ORAL | Status: AC
  Administered 2019-10-01: 40 meq via ORAL
  Filled 2019-10-01: qty 2

## 2019-10-01 MED ORDER — DEXAMETHASONE SODIUM PHOSPHATE 10 MG/ML IJ SOLN
INTRAMUSCULAR | Status: DC | PRN
  Administered 2019-10-01: 5 mg via INTRAVENOUS

## 2019-10-01 MED ORDER — PROPOFOL 10 MG/ML IV BOLUS
INTRAVENOUS | Status: AC
  Filled 2019-10-01: qty 20

## 2019-10-01 MED ORDER — OXYCODONE-ACETAMINOPHEN 5-325 MG PO TABS: 1.0000 | ORAL_TABLET | ORAL | Status: DC | PRN

## 2019-10-01 MED ORDER — FENTANYL CITRATE (PF) 100 MCG/2ML IJ SOLN
25.0000 ug | INTRAMUSCULAR | Status: AC | PRN
  Administered 2019-10-01 (×4): 25 ug via INTRAVENOUS

## 2019-10-01 MED ORDER — ACETAMINOPHEN 325 MG PO TABS
325.0000 mg | ORAL_TABLET | Freq: Four times a day (QID) | ORAL | Status: DC | PRN
  Administered 2019-10-02 – 2019-10-04 (×3): 650 mg via ORAL
  Filled 2019-10-01 (×3): qty 2

## 2019-10-01 MED ORDER — APIXABAN 5 MG PO TABS
5.0000 mg | ORAL_TABLET | Freq: Two times a day (BID) | ORAL | Status: DC
  Administered 2019-10-02 – 2019-10-04 (×5): 5 mg via ORAL
  Filled 2019-10-01 (×5): qty 1

## 2019-10-01 MED ORDER — PROPOFOL 10 MG/ML IV BOLUS
INTRAVENOUS | Status: DC | PRN
  Administered 2019-10-01: 200 mg via INTRAVENOUS

## 2019-10-01 MED ORDER — DEXTROSE 5 % IV SOLN
3.0000 g | Freq: Four times a day (QID) | INTRAVENOUS | Status: AC
  Administered 2019-10-01 – 2019-10-02 (×3): 3 g via INTRAVENOUS
  Filled 2019-10-01 (×3): qty 3

## 2019-10-01 MED ORDER — HYDRALAZINE HCL 20 MG/ML IJ SOLN
5.0000 mg | INTRAMUSCULAR | Status: DC | PRN
  Filled 2019-10-01: qty 0.25

## 2019-10-01 MED ORDER — OXYCODONE HCL 5 MG PO TABS
ORAL_TABLET | ORAL | Status: AC
  Filled 2019-10-01: qty 1

## 2019-10-01 MED ORDER — SODIUM CHLORIDE 0.9 % IV SOLN: INTRAVENOUS | Status: DC

## 2019-10-01 MED ORDER — POTASSIUM CHLORIDE CRYS ER 20 MEQ PO TBCR
30.0000 meq | EXTENDED_RELEASE_TABLET | Freq: Once | ORAL | Status: AC
  Administered 2019-10-01: 30 meq via ORAL
  Filled 2019-10-01: qty 2

## 2019-10-01 MED ORDER — CHLORHEXIDINE GLUCONATE 0.12 % MT SOLN
OROMUCOSAL | Status: AC
  Administered 2019-10-01: 15 mL via OROMUCOSAL
  Filled 2019-10-01: qty 15

## 2019-10-01 MED ORDER — BISACODYL 10 MG RE SUPP
10.0000 mg | Freq: Every day | RECTAL | Status: DC | PRN
  Administered 2019-10-03: 10 mg via RECTAL
  Filled 2019-10-01: qty 1

## 2019-10-01 MED ORDER — OXYCODONE HCL 5 MG/5ML PO SOLN: 5.0000 mg | Freq: Once | ORAL | Status: AC | PRN

## 2019-10-01 MED ORDER — FLEET ENEMA 7-19 GM/118ML RE ENEM: 1.0000 | ENEMA | Freq: Once | RECTAL | Status: DC | PRN

## 2019-10-01 MED ORDER — FENTANYL CITRATE (PF) 100 MCG/2ML IJ SOLN
INTRAMUSCULAR | Status: DC | PRN
  Administered 2019-10-01: 100 ug via INTRAVENOUS
  Administered 2019-10-01 (×3): 50 ug via INTRAVENOUS

## 2019-10-01 MED ORDER — MIDAZOLAM HCL 2 MG/2ML IJ SOLN
INTRAMUSCULAR | Status: AC
  Filled 2019-10-01: qty 2

## 2019-10-01 MED ORDER — SUCCINYLCHOLINE CHLORIDE 200 MG/10ML IV SOSY
PREFILLED_SYRINGE | INTRAVENOUS | Status: AC
  Filled 2019-10-01: qty 10

## 2019-10-01 MED ORDER — ACETAMINOPHEN 10 MG/ML IV SOLN
INTRAVENOUS | Status: AC
  Filled 2019-10-01: qty 100

## 2019-10-01 MED ORDER — SODIUM CHLORIDE FLUSH 0.9 % IV SOLN
INTRAVENOUS | Status: AC
  Filled 2019-10-01: qty 10

## 2019-10-01 MED ORDER — SENNOSIDES-DOCUSATE SODIUM 8.6-50 MG PO TABS: 1.0000 | ORAL_TABLET | Freq: Every evening | ORAL | Status: DC | PRN

## 2019-10-01 MED ORDER — DEXTROSE 5 % IV SOLN
3.0000 g | Freq: Once | INTRAVENOUS | Status: AC
  Administered 2019-10-01: 3 g via INTRAVENOUS
  Filled 2019-10-01: qty 3000
  Filled 2019-10-01: qty 3

## 2019-10-01 MED ORDER — OXYCODONE HCL 5 MG PO TABS
5.0000 mg | ORAL_TABLET | Freq: Once | ORAL | Status: AC | PRN
  Administered 2019-10-01: 5 mg via ORAL

## 2019-10-01 MED ORDER — ALBUTEROL SULFATE HFA 108 (90 BASE) MCG/ACT IN AERS
2.0000 | INHALATION_SPRAY | RESPIRATORY_TRACT | Status: DC | PRN
  Filled 2019-10-01: qty 6.7

## 2019-10-01 MED ORDER — CHLORHEXIDINE GLUCONATE 0.12 % MT SOLN: 15.0000 mL | Freq: Once | OROMUCOSAL | Status: AC

## 2019-10-01 MED ORDER — ONDANSETRON HCL 4 MG/2ML IJ SOLN
INTRAMUSCULAR | Status: AC
  Filled 2019-10-01: qty 2

## 2019-10-01 MED ORDER — LACTATED RINGERS IV SOLN: INTRAVENOUS | Status: DC

## 2019-10-01 MED ORDER — MAGNESIUM HYDROXIDE 400 MG/5ML PO SUSP
30.0000 mL | Freq: Every day | ORAL | Status: DC | PRN
  Filled 2019-10-01: qty 30

## 2019-10-01 MED ORDER — DEXAMETHASONE SODIUM PHOSPHATE 10 MG/ML IJ SOLN
INTRAMUSCULAR | Status: AC
  Filled 2019-10-01: qty 1

## 2019-10-01 SURGICAL SUPPLY — 59 items
BIT DRILL 2.5X2.75 QC CALB (BIT) ×2 IMPLANT
BIT DRILL 3.5X5.5 QC CALB (BIT) ×2 IMPLANT
BIT DRILL CALIBRATED 2.7 (BIT) ×1 IMPLANT
BIT DRILL CALIBRATED 2.7MM (BIT) ×1
BLADE SURG SZ10 CARB STEEL (BLADE) ×6 IMPLANT
BNDG COHESIVE 4X5 TAN STRL (GAUZE/BANDAGES/DRESSINGS) ×3 IMPLANT
BNDG ELASTIC 4X5.8 VLCR STR LF (GAUZE/BANDAGES/DRESSINGS) ×6 IMPLANT
BNDG ELASTIC 6X5.8 VLCR STR LF (GAUZE/BANDAGES/DRESSINGS) ×3 IMPLANT
BNDG ESMARK 6X12 TAN STRL LF (GAUZE/BANDAGES/DRESSINGS) ×3 IMPLANT
BNDG PLASTER FAST 4X5 WHT LF (CAST SUPPLIES) ×4 IMPLANT
CANISTER SUCT 1200ML W/VALVE (MISCELLANEOUS) ×3 IMPLANT
CHLORAPREP W/TINT 26 (MISCELLANEOUS) ×4 IMPLANT
COVER WAND RF STERILE (DRAPES) ×3 IMPLANT
CUFF TOURN SGL QUICK 24 (TOURNIQUET CUFF) ×2
CUFF TOURN SGL QUICK 30 (TOURNIQUET CUFF)
CUFF TRNQT CYL 24X4X16.5-23 (TOURNIQUET CUFF) IMPLANT
CUFF TRNQT CYL 30X4X21-28X (TOURNIQUET CUFF) IMPLANT
DRAPE C-ARM XRAY 36X54 (DRAPES) ×3 IMPLANT
DRAPE C-ARMOR (DRAPES) ×3 IMPLANT
DRAPE INCISE IOBAN 66X45 STRL (DRAPES) ×3 IMPLANT
DRAPE SPLIT 6X30 W/TAPE (DRAPES) ×3 IMPLANT
DRAPE U-SHAPE 47X51 STRL (DRAPES) ×1 IMPLANT
ELECT CAUTERY BLADE 6.4 (BLADE) ×3 IMPLANT
ELECT REM PT RETURN 9FT ADLT (ELECTROSURGICAL) ×3
ELECTRODE REM PT RTRN 9FT ADLT (ELECTROSURGICAL) ×1 IMPLANT
GAUZE SPONGE 4X4 12PLY STRL (GAUZE/BANDAGES/DRESSINGS) ×3 IMPLANT
GAUZE XEROFORM 1X8 LF (GAUZE/BANDAGES/DRESSINGS) ×3 IMPLANT
GLOVE BIO SURGEON STRL SZ8 (GLOVE) ×6 IMPLANT
GLOVE INDICATOR 8.0 STRL GRN (GLOVE) ×3 IMPLANT
GOWN STRL REUS W/ TWL LRG LVL3 (GOWN DISPOSABLE) ×1 IMPLANT
GOWN STRL REUS W/ TWL XL LVL3 (GOWN DISPOSABLE) ×1 IMPLANT
GOWN STRL REUS W/TWL LRG LVL3 (GOWN DISPOSABLE) ×2
GOWN STRL REUS W/TWL XL LVL3 (GOWN DISPOSABLE) ×2
HEMOVAC 400ML (MISCELLANEOUS)
K-WIRE ACE 1.6X6 (WIRE) ×3
KIT DRAIN HEMOVAC JP 7FR 400ML (MISCELLANEOUS) ×1 IMPLANT
KIT TURNOVER KIT A (KITS) ×3 IMPLANT
KWIRE ACE 1.6X6 (WIRE) IMPLANT
LABEL OR SOLS (LABEL) ×1 IMPLANT
NS IRRIG 1000ML POUR BTL (IV SOLUTION) ×3 IMPLANT
PACK EXTREMITY (MISCELLANEOUS) ×3 IMPLANT
PAD ABD DERMACEA PRESS 5X9 (GAUZE/BANDAGES/DRESSINGS) ×6 IMPLANT
PAD CAST CTTN 4X4 STRL (SOFTGOODS) ×2 IMPLANT
PAD PREP 24X41 OB/GYN DISP (PERSONAL CARE ITEMS) ×3 IMPLANT
PADDING CAST COTTON 4X4 STRL (SOFTGOODS) ×4
PLATE LOCK 7H 92 BILAT FIB (Plate) ×2 IMPLANT
SCREW ACE CAN 4.0 22M (Screw) ×2 IMPLANT
SCREW LOCK CORT STAR 3.5X12 (Screw) ×4 IMPLANT
SCREW NON LOCKING LP 3.5 14MM (Screw) ×2 IMPLANT
SCREW NON LOCKING LP 3.5 16MM (Screw) ×4 IMPLANT
SPLINT CAST 1 STEP 4X30 (MISCELLANEOUS) ×2 IMPLANT
SPLINT CAST 1 STEP 5X30 WHT (MISCELLANEOUS) ×2 IMPLANT
SPONGE LAP 18X18 RF (DISPOSABLE) ×3 IMPLANT
STAPLER SKIN PROX 35W (STAPLE) ×3 IMPLANT
STOCKINETTE IMPERV 14X48 (MISCELLANEOUS) ×3 IMPLANT
SUT VIC AB 0 CT1 36 (SUTURE) ×3 IMPLANT
SUT VIC AB 2-0 SH 27 (SUTURE) ×4
SUT VIC AB 2-0 SH 27XBRD (SUTURE) ×2 IMPLANT
SYR 10ML LL (SYRINGE) ×3 IMPLANT

## 2019-10-01 NOTE — Anesthesia Procedure Notes (Signed)
Procedure Name: Intubation Date/Time: 10/01/2019 11:53 AM Performed by: Jaye Beagle, CRNA Pre-anesthesia Checklist: Patient identified, Emergency Drugs available, Suction available, Patient being monitored and Timeout performed Patient Re-evaluated:Patient Re-evaluated prior to induction Oxygen Delivery Method: Circle system utilized Preoxygenation: Pre-oxygenation with 100% oxygen Induction Type: IV induction Ventilation: Mask ventilation without difficulty Laryngoscope Size: McGraph and 3 Grade View: Grade I Tube type: Oral Tube size: 7.0 mm Number of attempts: 1 Airway Equipment and Method: Stylet Placement Confirmation: ETT inserted through vocal cords under direct vision and breath sounds checked- equal and bilateral Secured at: 21 cm Tube secured with: Tape Dental Injury: Teeth and Oropharynx as per pre-operative assessment

## 2019-10-01 NOTE — Transfer of Care (Signed)
Immediate Anesthesia Transfer of Care Note  Patient: Stacey [name]  Procedure(s) Performed: OPEN REDUCTION INTERNAL FIXATION (ORIF) ANKLE FRACTURE (Right Ankle)  Patient Location: PACU  Anesthesia Type:General  Level of Consciousness: drowsy  Airway & Oxygen Therapy: Patient Spontanous Breathing and Patient connected to face mask oxygen  Post-op Assessment: Report given to RN  Post vital signs: stable  Last Vitals:  Vitals Value Taken Time  BP 119/40 10/01/19 1347  Temp    Pulse 65 10/01/19 1348  Resp    SpO2 97 % 10/01/19 1348  Vitals shown include unvalidated device data.  Last Pain:  Vitals:   10/01/19 1103  TempSrc: Oral  PainSc: 0-No pain         Complications: No apparent anesthesia complications

## 2019-10-01 NOTE — Consult Note (Signed)
ORTHOPAEDIC CONSULTATION  REQUESTING PHYSICIAN: Sharman Cheek, MD  Chief Complaint:   Right ankle pain.  History of Present Illness: Stacey [name] is a 69 y.o. female with multiple medical problems including chronic congestive heart failure, Eliquis dependent chronic atrial fibrillation requiring several episodes of cardioversion over the past 5 years, hypertension, coronary artery disease, obstructive sleep apnea, and obesity who lives independently with her husband.  Apparently, the patient was in her usual state of health last night when she turned in her living room and felt her knee buckle, causing her to fall and injure her right ankle.  She was unable to bear weight on the ankle.  She was brought to the emergency room where x-rays demonstrated a displaced right ankle fracture.  The patient denies any associated injury.  She did not strike her head or lose consciousness.  The patient also denies any lightheadedness, dizziness, chest pain, shortness of breath, or other symptoms which may have precipitated her fall.  Past Medical History:  Diagnosis Date  . Anxiety   . Arthritis    "knees" (09/26/2017)  . Chronic diastolic CHF (congestive heart failure) (HCC)    a. Dx 08/2013 in setting of rapid afib;  b. 08/2013 Echo: EF 55-60%, mildly dil LA, nl RV.  Marland Kitchen Drug-induced torsades de pointes 10/06/2017  . GERD (gastroesophageal reflux disease)   . High cholesterol   . History of blood transfusion 1975   "related to ectopic pregnancy  . Hypertension   . Non-obstructive CAD    a. 10/2013 Myoview: anteroseptal, apical, septal mild ischemia, nl LV fxn;  b. Cath: LAD 58m LCX 20ost, RCA 76m.  . Obesity   . OSA on CPAP   . Paroxysmal atrial fibrillation (HCC)    a. Dx 08/2013. CHA2DS2VASc = 3 (diast chf, htn, female) -->Xarelto initiated; b. 09/2013 s/p DCCV;  c. 10/2013 recurrent afib->Flecainide added->S/P DCCV;  d. 11/2013 Recurrent  afib while off of flecainide (2/2 abnl nuc study)-->Flecainide resumed after nl cath-->DCCV;  e. Recurrent Afib 01/2014-->Flecainide increased to 150mg  BID.  Pneumonia    "1-2 times" (09/26/2017)   Past Surgical History:  Procedure Laterality Date  . BREAST BIOPSY Right 2007   stereo-neg  . CARDIAC CATHETERIZATION  10/2013   [facility]  . CARDIOVERSION N/A 08/29/2017   Procedure: CARDIOVERSION;  Surgeon: 10/29/2017, MD;  Location: [facility] ORS;  Service: Cardiovascular;  Laterality: N/A;  . CARDIOVERSION     "I've had a total of 4" (09/26/2017)  . CARDIOVERSION N/A 12/08/2018   Procedure: CARDIOVERSION;  Surgeon: 12/10/2018, MD;  Location: [facility] ORS;  Service: Cardiovascular;  Laterality: N/A;  . ECTOPIC PREGNANCY SURGERY  1975  . TONSILLECTOMY     Social History   Socioeconomic History  . Marital status: Married    Spouse name: Not on file  . Number of children: Not on file  . Years of education: Not on file  . Highest education level: Not on file  Occupational History  . Not on file  Tobacco Use  . Smoking status: Never Smoker  . Smokeless tobacco: Never Used  Substance and Sexual Activity  . Alcohol use: Yes    Alcohol/week: 7.0 standard drinks    Types: 7 Glasses of wine per week    Comment: 1 glass of wine/day.  . Drug use: Never  . Sexual activity: Not Currently  Other Topics Concern  . Not on file  Social History Narrative   Lives in [place].  Works as [employer].  Social Determinants of Health   Financial Resource Strain:   . Difficulty of Paying Living Expenses:   Food Insecurity:   . Worried About Programme researcher, broadcasting/film/video in the Last Year:   . Barista in the Last Year:   Transportation Needs:   . Freight forwarder (Medical):   Marland Kitchen Lack of Transportation (Non-Medical):   Physical Activity:   . Days of Exercise per Week:   . Minutes of Exercise per Session:   Stress:   . Feeling of Stress :   Social Connections:   . Frequency of  Communication with Friends and Family:   . Frequency of Social Gatherings with Friends and Family:   . Attends Religious Services:   . Active Member of Clubs or Organizations:   . Attends Banker Meetings:   Marland Kitchen Marital Status:    Family History  Problem Relation Age of Onset  . CAD Father        s/p cabg - ? h/o afib.  Marland Kitchen Heart attack Father   . Hypertension Mother   . Breast cancer Mother 45   Allergies  Allergen Reactions  . Levaquin [Levofloxacin In D5w] Other (See Comments)    Severe dizziness   . Metoprolol Itching, Nausea And Vomiting, Rash and Other (See Comments)    Dizziness  . Tikosyn [Dofetilide] Other (See Comments)    Developed Torsades on Tikosyn 09/28/17  . Ace Inhibitors Cough   Prior to Admission medications   Medication Sig Start Date End Date Taking? Authorizing Provider  amiodarone (PACERONE) 200 MG tablet TAKE 1 TABLET(200 MG) BY MOUTH DAILY 01/02/19   Duke Salvia, MD  apixaban (ELIQUIS) 5 MG TABS tablet Take 1 tablet (5 mg total) by mouth 2 (two) times daily. 05/18/19   Duke Salvia, MD  carvedilol (COREG) 12.5 MG tablet TAKE 1 TABLET(12.5 MG) BY MOUTH TWICE DAILY WITH A MEAL 04/10/19   Duke Salvia, MD  diclofenac sodium (VOLTAREN) 1 % GEL Apply 1 application topically 3 (three) times daily as needed (knee pain).    [provider]  ENTRESTO 49-51 MG TAKE 1 TABLET BY MOUTH TWICE DAILY 04/23/19   Duke Salvia, MD  esomeprazole (NEXIUM) 40 MG capsule TAKE 1 CAPSULE BY MOUTH EVERY MORNING Patient taking differently: Take 40 mg by mouth daily.  12/15/17   Duke Salvia, MD  furosemide (LASIX) 40 MG tablet Take 1 tablet (40 mg total) by mouth daily. 07/09/19   Iran Ouch, MD  HYDROcodone-acetaminophen (NORCO) 10-325 MG tablet Take 1 tablet by mouth every 6 (six) hours as needed for moderate pain or severe pain.  08/27/16   [provider]  levothyroxine (SYNTHROID) 100 MCG tablet Take 100 mcg by mouth daily.  10/02/18    [provider]  venlafaxine (EFFEXOR) 75 MG tablet Take 75 mg by mouth 2 (two) times daily.    [provider]  zolpidem (AMBIEN) 10 MG tablet Take 10 mg by mouth at bedtime.     [provider]   DG Ankle 2 Views Right  Result Date: 10/01/2019 CLINICAL DATA:  Right ankle fracture reduction EXAM: RIGHT ANKLE - 2 VIEW COMPARISON:  Earlier today FINDINGS: Distal diaphysis fracture of the fibula and medial soft tissue injury with lateral and posterior malalignment at the ankle. Posterior displacement with marked anterior joint space widening is worsened from prior study. A splint has been placed. IMPRESSION: Worsening alignment with interval marked posterior displacement of the ankle. Electronically  Signed   By: Monte Fantasia M.D.   On: 10/01/2019 05:48   DG Ankle Complete Right  Result Date: 10/01/2019 CLINICAL DATA:  Right ankle pain, swelling, and ecchymosis after fall. EXAM: RIGHT ANKLE - COMPLETE 3+ VIEW COMPARISON:  None. FINDINGS: Displaced distal fibular fracture at and just above the level of the ankle mortise. There is disruption of the ankle mortise with widening of the medial clear space and probable avulsion injury from the medial malleolus. No definite posterior tibial tubercle fracture. There is generalized soft tissue edema. Ankle joint effusion. Plantar calcaneal spur. IMPRESSION: Displaced distal fibular fracture with probable avulsion injury from the medial malleolus. Disruption of the ankle mortise with widening of the medial clear space. No definite posterior malleolar fracture. Electronically Signed   By: Keith Rake M.D.   On: 10/01/2019 03:32    Positive ROS: All other systems have been reviewed and were otherwise negative with the exception of those mentioned in the HPI and as above.  Physical Exam: General:  Alert, no acute distress Psychiatric:  Patient is competent for consent with normal mood and affect   Cardiovascular:  No pedal  edema Respiratory:  No wheezing, non-labored breathing GI:  Abdomen is soft and non-tender Skin:  No lesions in the area of chief complaint Neurologic:  Sensation intact distally Lymphatic:  No axillary or cervical lymphadenopathy  Orthopedic Exam:  Orthopedic examination is limited to the right lower extremity and foot.  The patient's foot is in a posterior splint with sugar tong supplement.  The splint appears to be in good position as does the ankle.  Skin is intact at the proximal and distal margins of the cast, other than a small superficial laceration in the pre-patellar region which is without any drainage or surrounding erythema.  She is able dorsiflex and plantarflex her toes.  Sensations intact to light touch to all distributions.  She has good capillary refill to her toes.  X-rays:  AP, lateral, and mortise views of the right ankle are available for review and have been reviewed by myself.  These films demonstrate a laterally displaced distal fibular fracture with medial widening, consistent with deltoid ligament disruption.  There is no posterior malleolar disruption.  No significant degenerative changes of the ankle joint are noted.  No lytic lesions or other acute bony abnormalities are identified.  Assessment: Unstable distal fibular fracture, right ankle.  Plan: The treatment options have been discussed with the patient, including both surgical and nonsurgical choices.  The patient would like to proceed with surgical intervention to include an open reduction and internal fixation of the displaced right distal fibular fracture.  The procedure has been discussed in detail with the patient, as have the potential risks (including bleeding, infection, nerve and/or blood vessel injury, persistent or recurrent pain, stiffness, development of degenerative joint disease, malunion and/or nonunion, need for further surgery, blood clots, strokes, heart attacks and/or arrhythmias, etc.) and  benefits.  The patient states her understanding and wishes to proceed.  A formal written consent has been obtained.  Thank you for asking me to participate in the care of this most delightful woman.  I will be happy to follow her with you.    Pascal Lux, MD  Beeper #:  [phone]  10/01/2019 7:50 AM

## 2019-10-01 NOTE — ED Triage Notes (Signed)
Patient arrives from home via [facility].  Pt states her right knee gave out causing her to fall and hurt her right ankle.  Swelling and bruising noted of right ankle.  Pain is 8/10 at this time.

## 2019-10-01 NOTE — Op Note (Signed)
10/01/2019  1:58 PM  Patient:   Stacey [name]  Pre-Op Diagnosis:   Closed unstable displaced distal fibular fracture, right ankle.  Post-Op Diagnosis:   Same.  Procedure:   Open reduction and internal fixation of displaced distal fibular fracture, right ankle.  Surgeon:   Maryagnes Amos, MD  Assistant:   Nyra Jabs, PA-S  Anesthesia:   GET  Findings:   As above.  Complications:   None  EBL:   10 cc  Fluids:   800 cc crystalloid  UOP:   None  TT:   67 min at 250 mmHg  Drains:   None  Closure:   Staples  Implants:   Biomet ALPS 7-hole composite locking plate and screws  Brief Clinical Note:   The patient is a 69 year old female who sustained above-noted injury last evening when she apparently lost her balance and fell on her living room floor. She was brought to the emergency room where x-rays demonstrated the above-noted injury. Her ankle was reduced and splinted by the ER provider. The patient has been cleared medically and presents at this time for definitive management of his/her injury.  Procedure:   The patient was brought into the operating room and lain in the supine position. After adequate general endotracheal intubation and anesthesia was obtained, the right foot and lower leg were prepped with ChloraPrep solution, then draped sterilely. Preoperative antibiotics were administered. A timeout was performed to verify the appropriate surgical site before the limb was exsanguinated with an Esmarch and the calf tourniquet inflated to 250 mmHg. Laterally, an 10-12 cm incision was made over the lateral aspect of the distal fibula. The incision was carried down through the subcutaneous tissues to expose the fracture site.   The fracture hematoma was debrided before the fracture was reduced and temporarily secured using a bone clamp. A 4.0 mm lag screw was placed in an anterior to posterior direction perpendicular to the fracture. A 7-hole Biomet composite locking plate  was contoured using the appropriate plate benders before it was applied over the lateral aspect of the distal fibula. After verifying its position fluoroscopically, it was secured using a 3.5 mm nonlocking cortical screw proximal to the fracture. Again the plate's position was adjusted slightly based on AP and lateral projections before it was secured using additional bicortical screws proximally and multiple locking screws distally. The adequacy of fracture reduction and hardware position was verified fluoroscopically in AP and lateral projections and found to be excellent.  The wound was copiously irrigated with sterile saline solution before the periosteal layer was closed using #0 Vicryl interrupted sutures. The subcutaneous tissues were closed in two layers using 2-0 Vicryl interrupted sutures before the skin was closed using staples. A total of 20 cc of 0.5% plain Sensorcaine was injected in and around the incision sites to help with postoperative analgesia. A sterile bulky dressing was applied to the wound before the patient was placed into a posterior splint with a sugar tong supplement, maintaining the ankle in neutral dorsiflexion. The patient was then awakened, extubated, and returned to the recovery room in satisfactory condition after tolerating the procedure well.

## 2019-10-01 NOTE — ED Notes (Signed)
Pt's ankle re splinted at this time with EDP Scotty Court and RN Vernona Rieger. Surgeon at bedside and approves of splint.  Surgeon going over consent and procedure. Pt signing consent at this time.

## 2019-10-01 NOTE — ED Notes (Signed)
Pt dressed into hospital gown. Pt given warm blanket. OR at bedside transporting pt up to OR.  Instructed OR to let RN to call for report if she has any questions.  Paperwork and consent with pt.

## 2019-10-01 NOTE — Consult Note (Signed)
Cardiology Consultation:   Patient ID: Stacey [name] MRN: [id number]; DOB: January 26, 1951  Admit date: 10/01/2019 Date of Consult: 10/01/2019  Primary Care Provider: Marguarite Arbour, MD Primary Cardiologist: Stacey Bears, MD  Primary Electrophysiologist:  Stacey Manges, MD    Patient Profile:   Stacey [name] is a 69 y.o. 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 Joice [name].  History of Present Illness:   Stacey [name] is a 69 year old female with PMH as above. She was admitted and diagnosed with A. fib with RVR in 2015.  Echo at that time showed EF 55-60% with mildly dilated left atrium.  Cardiac cath 2015 showed nonobstructive CAD.  She has undergone many cardioversions and been evaluated by several different EP physicians, as well as the A. fib clinic.  In 08/2017, echo showed EF 20 to 25% with diffuse hypokinesis, mild MR, mild LAE.  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  She was hospitalized 09/2017 for initiation of Tikosyn and reverted to NSR, but developed torsades the pointe and a prolonged QT.  She was without recurrent torsades with washout of Tikosyn.  She was started on amiodarone.  She was seen by EP with consideration of catheter ablation though elected to continue on amiodarone.  Echo 11/2017 showed improvement in LVSF with EF 50 to 55%, G1 DD, mild LAE/ RAE.  She was evaluated by Stacey [name] 04/2018 and denied any recurrence of atrial fibrillation to her knowledge.    In 11/2018, she contacted the office with report of irregular heart rate at home by her pulse ox, LEE, SOB, and increased fatigue. 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 She noted stable two pillow orthopnea. EKG showed Afib with RVR. She was scheduled for and underwent DCCV 12/08/2018, as she continued to prefer intermittent cardioversion and defer  consideration of catheter ablation at that time.  She was seen by EP 12/14/2018 with up titration of her amiodarone.  She was last seen in the office 06/21/2019 by her primary cardiologist, Stacey [name].  At that time, she was maintaining NSR with QTC mildly prolonged at 488 ms. She was euvolemic on exam and doing well on her CPAP. She reported medication compliance.   Today, 10/01/19, she presents to [facility] after a mechanical fall and for cardiac preoperative evaluation prior to ankle surgery.  She reports that she was going to get a second remote control for her TV late Sunday night when she turned and her right knee gave out, causing her to fall and hurt her right ankle.  She reports that she fell onto carpet and did not hit any furniture on the way down; however, she hurt her right ankle and knee.  She did not hit her head. At time of presentation, she reported pain 8/10.  She denied any chest pain, racing heart rate, presyncope, or LOC associated with the fall.  She reported stable heart failure symptoms and actual improvement in her lower extremity edema with her Lasix, noting that she no longer got any lower extremity edema as long as she took this medication.  She also  reported stable orthopnea and breathing. She reported medication compliance and was without s/sx of bleeding other than those associated with the fall. Last dose of Elqiuis was 9PM.  In the ED, xray showed acute displaced R fibular fracture. Repeat Xray showed posterior subluxation and instability with recommendation by orthopedics for admission. Findings were discussed with Dr. Joice [name], who recommended 3 sided splint and surgery later same day with cardiology consulted  for preoperative evaluation for closed displaced oblique fracture of shaft of the R fibula with surgery to tentatively take place later this afternoon 10/01/19.   Heart Pathway Score:     Past Medical History:  Diagnosis Date   Anxiety    Arthritis    "knees" (09/26/2017)   Chronic diastolic CHF (congestive heart failure) (HCC)    a. Dx 08/2013 in setting of rapid afib;  b. 08/2013 Echo: EF 55-60%, mildly dil LA, nl RV.   Drug-induced torsades de pointes 10/06/2017   GERD (gastroesophageal reflux disease)    High cholesterol    History of blood transfusion 1975   "related to ectopic pregnancy   Hypertension    Non-obstructive CAD    a. 10/2013 Myoview: anteroseptal, apical, septal mild ischemia, nl LV fxn;  b. Cath: LAD 44m LCX 20ost, RCA 37m.   Obesity    OSA on CPAP    Paroxysmal atrial fibrillation (HCC)    a. Dx 08/2013. CHA2DS2VASc = 3 (diast chf, htn, female) -->Xarelto initiated; b. 09/2013 s/p DCCV;  c. 10/2013 recurrent afib->Flecainide added->S/P DCCV;  d. 11/2013 Recurrent afib while off of flecainide (2/2 abnl nuc study)-->Flecainide resumed after nl cath-->DCCV;  e. Recurrent Afib 01/2014-->Flecainide increased to 150mg  BID.   Pneumonia    "1-2 times" (09/26/2017)    Past Surgical History:  Procedure Laterality Date   BREAST BIOPSY Right 2007   stereo-neg   CARDIAC CATHETERIZATION  10/2013   [facility]   CARDIOVERSION N/A 08/29/2017   Procedure: CARDIOVERSION;  Surgeon: 10/29/2017, MD;  Location: [facility] ORS;  Service: Cardiovascular;  Laterality: N/A;   CARDIOVERSION     "I've had a total of 4" (09/26/2017)   CARDIOVERSION N/A 12/08/2018   Procedure: CARDIOVERSION;  Surgeon: 12/10/2018, MD;  Location: [facility] ORS;  Service: Cardiovascular;  Laterality: N/A;   ECTOPIC PREGNANCY SURGERY  1975   TONSILLECTOMY       Home Medications:  Prior to Admission medications   Medication Sig Start Date End Date Taking? Authorizing Provider  amiodarone (PACERONE) 200 MG  tablet TAKE 1 TABLET(200 MG) BY MOUTH DAILY 01/02/19  Yes 03/04/19, MD  apixaban (ELIQUIS) 5 MG TABS tablet Take 1 tablet (5 mg total) by mouth 2 (two) times daily. 05/18/19  Yes 05/20/19, MD  carvedilol (COREG) 12.5 MG tablet TAKE 1 TABLET(12.5 MG) BY MOUTH TWICE DAILY WITH A MEAL 04/10/19  Yes 04/12/19, MD  diclofenac sodium (VOLTAREN) 1 % GEL Apply 1 application topically 3 (three) times daily as needed (knee pain).   Yes [provider]  esomeprazole (NEXIUM) 40 MG capsule TAKE 1 CAPSULE BY MOUTH EVERY MORNING Patient taking differently: Take 40 mg by mouth daily.  12/15/17  Yes 12/17/17, MD  furosemide (LASIX) 40 MG tablet Take 1 tablet (40 mg total) by mouth daily. 07/09/19  Yes 07/11/19, MD  HYDROcodone-acetaminophen (NORCO) 10-325 MG tablet Take 1 tablet by mouth every 6 (six) hours as needed for moderate pain or severe pain.  08/27/16  Yes [provider]  levothyroxine (  SYNTHROID) 100 MCG tablet Take 100 mcg by mouth daily.  10/02/18  Yes [provider]  rosuvastatin (CRESTOR) 10 MG tablet Take 10 mg by mouth at bedtime. 09/29/19  Yes [provider]  zolpidem (AMBIEN) 10 MG tablet Take 10 mg by mouth at bedtime.    Yes [provider]  ADVAIR HFA 230-21 MCG/ACT inhaler SMARTSIG:2 Puff(s) Via Inhaler Every 12 Hours 07/16/19   [provider]  venlafaxine (EFFEXOR) 75 MG tablet Take 75 mg by mouth 2 (two) times daily.    [provider]    Inpatient Medications: Scheduled Meds:  potassium chloride  30 mEq Oral Once   Continuous Infusions:   ceFAZolin (ANCEF) IV     PRN Meds: acetaminophen, hydrALAZINE, methocarbamol, morphine injection, oxyCODONE-acetaminophen  Allergies:    Allergies  Allergen Reactions   Levaquin [Levofloxacin In D5w] Other (See Comments)    Severe dizziness    Metoprolol Itching, Nausea And Vomiting, Rash and Other (See Comments)    Dizziness   Tikosyn  [Dofetilide] Other (See Comments)    Developed Torsades on Tikosyn 09/28/17   Ace Inhibitors Cough    Social History:   Social History   Socioeconomic History   Marital status: Married    Spouse name: Not on file   Number of children: Not on file   Years of education: Not on file   Highest education level: Not on file  Occupational History   Not on file  Tobacco Use   Smoking status: Never Smoker   Smokeless tobacco: Never Used  Substance and Sexual Activity   Alcohol use: Yes    Alcohol/week: 7.0 standard drinks    Types: 7 Glasses of wine per week    Comment: 1 glass of wine/day.   Drug use: Never   Sexual activity: Not Currently  Other Topics Concern   Not on file  Social History Narrative   Lives in [place].  Works as Training and development officer.    Social Determinants of Health   Financial Resource Strain:    Difficulty of Paying Living Expenses:   Food Insecurity:    Worried About Charity fundraiser in the Last Year:    Arboriculturist in the Last Year:   Transportation Needs:    Film/video editor (Medical):    Lack of Transportation (Non-Medical):   Physical Activity:    Days of Exercise per Week:    Minutes of Exercise per Session:   Stress:    Feeling of Stress :   Social Connections:    Frequency of Communication with Friends and Family:    Frequency of Social Gatherings with Friends and Family:    Attends Religious Services:    Active Member of Clubs or Organizations:    Attends Music therapist:    Marital Status:   Intimate Partner Violence:    Fear of Current or Ex-Partner:    Emotionally Abused:    Physically Abused:    Sexually Abused:     Family History:    Family History  Problem Relation Age of Onset   CAD Father        s/p cabg - ? h/o afib.   Heart attack Father    Hypertension Mother    Breast cancer Mother 78     ROS:  Please see the history of present illness.  Review of Systems    Respiratory: Negative for cough, hemoptysis, sputum production, shortness of breath and wheezing.   Cardiovascular:  Positive for orthopnea. Negative for chest pain, palpitations, claudication, leg swelling and PND.       Stable orthopnea  Gastrointestinal: Negative for abdominal pain, blood in stool, melena, nausea and vomiting.  Genitourinary: Negative for hematuria.  Musculoskeletal: Positive for falls.  Neurological: Negative for dizziness, focal weakness and loss of consciousness.  All other systems reviewed and are negative.   All other ROS reviewed and negative.     Physical Exam/Data:   Vitals:   10/01/19 0330 10/01/19 0540 10/01/19 0700 10/01/19 0815  BP: (!) 121/53 120/65 137/60   Pulse: (!) 59 (!) 58 60 64  Resp:  18 14 18   Temp:      TempSrc:      SpO2: 100% 95% 95% 91%  Weight:      Height:       No intake or output data in the 24 hours ending 10/01/19 0827 Last 3 Weights 10/01/2019 06/21/2019 12/19/2018  Weight (lbs) 239 lb 241 lb 12 oz 240 lb  Weight (kg) 108.41 kg 109.657 kg 108.863 kg     Body mass index is 42.34 kg/m.  General:  Well nourished, well developed, in no acute distress. HEENT: normal Neck: JVD difficult to assess 2/2 body habitus but no JVD visualized Vascular: No carotid bruits; radial pulses 2+ bilaterally Cardiac:  normal S1, S2; RRR; 1/6 systolic murmur in RUSB Lungs:  clear to auscultation bilaterally, no wheezing, rhonchi or rales  Abd: soft, nontender, no hepatomegaly  Ext: no edema. RLE wrapped and splinted with R knee showing small wound without bleeding from fall Musculoskeletal:  No deformities, BUE and BLE strength normal and equal Skin: warm and dry  Neuro:  No focal abnormalities noted Psych:  Normal affect   EKG:  The EKG was personally reviewed and demonstrates: SB, 59bpm, low voltage, nonspecific TWI as seen on previous tracings, QRS 166 ms by EKG computer calculation but not by hand calculation, prolonged QTc 12/21/2018  Telemetry:   Telemetry was personally reviewed and demonstrates:  NSR 50-60s  Relevant CV Studies:  Echo 12/06/2017 Procedure narrative: Transthoracic echocardiography. Image  quality was suboptimal. The study was technically difficult, as a  result of poor acoustic windows. Intravenous contrast (Definity)  was administered.  - Left ventricle: Systolic function was normal. The estimated  ejection fraction was in the range of 50% to 55%. Doppler  parameters are consistent with abnormal left ventricular  relaxation (grade 1 diastolic dysfunction).  - Left atrium: The atrium was mildly dilated.  - Right ventricle: The cavity size was normal. Systolic function  was normal.  - Right atrium: The atrium was mildly dilated.  Impressions:  - LVEF has improved significantly since prior echo on 09/13/17.   2015 Cath See scan in EMR Nonobstructive CAD  Laboratory Data:  High Sensitivity Troponin:  No results for input(s): TROPONINIHS in the last 720 hours.   Cardiac EnzymesNo results for input(s): TROPONINI in the last 168 hours. No results for input(s): TROPIPOC in the last 168 hours.  Chemistry Recent Labs  Lab 10/01/19 0702  NA 142  K 3.3*  CL 104  CO2 25  GLUCOSE 96  BUN 23  CREATININE 1.56*  CALCIUM 8.4*  GFRNONAA 34*  GFRAA 39*  ANIONGAP 13    No results for input(s): PROT, ALBUMIN, AST, ALT, ALKPHOS, BILITOT in the last 168 hours. Hematology Recent Labs  Lab 10/01/19 0702  WBC 6.3  RBC 3.27*  HGB 9.9*  HCT 31.5*  MCV 96.3  MCH 30.3  MCHC 31.4  RDW 16.1*  PLT 215   BNP Recent Labs  Lab 10/01/19 0702  BNP 328.1*    DDimer No results for input(s): DDIMER in the last 168 hours.   Radiology/Studies:  DG Ankle 2 Views Right  Result Date: 10/01/2019 CLINICAL DATA:  Post reduction right ankle fracture. EXAM: RIGHT ANKLE - 2 VIEW COMPARISON:  10/01/2019 at 5 a.m. FINDINGS: Alignment is improved from the earlier exam, most evident along lateral view, where the  posterior subluxation of the talus in relation to distal tibia has been reduced. Lateral talar subluxation as and mildly improved, with 4-5 mm of lateral subluxation persisting. There is 4 mm of lateral 6 mm of posterior displacement the distal fibular fracture. IMPRESSION: 1. Improved alignment of the ankle joint distal fibular fracture following closed reduction. Electronically Signed   By: Amie Portland M.D.   On: 10/01/2019 08:14   DG Ankle 2 Views Right  Result Date: 10/01/2019 CLINICAL DATA:  Right ankle fracture reduction EXAM: RIGHT ANKLE - 2 VIEW COMPARISON:  Earlier today FINDINGS: Distal diaphysis fracture of the fibula and medial soft tissue injury with lateral and posterior malalignment at the ankle. Posterior displacement with marked anterior joint space widening is worsened from prior study. A splint has been placed. IMPRESSION: Worsening alignment with interval marked posterior displacement of the ankle. Electronically Signed   By: Marnee Spring M.D.   On: 10/01/2019 05:48   DG Ankle Complete Right  Result Date: 10/01/2019 CLINICAL DATA:  Right ankle pain, swelling, and ecchymosis after fall. EXAM: RIGHT ANKLE - COMPLETE 3+ VIEW COMPARISON:  None. FINDINGS: Displaced distal fibular fracture at and just above the level of the ankle mortise. There is disruption of the ankle mortise with widening of the medial clear space and probable avulsion injury from the medial malleolus. No definite posterior tibial tubercle fracture. There is generalized soft tissue edema. Ankle joint effusion. Plantar calcaneal spur. IMPRESSION: Displaced distal fibular fracture with probable avulsion injury from the medial malleolus. Disruption of the ankle mortise with widening of the medial clear space. No definite posterior malleolar fracture. Electronically Signed   By: Narda Rutherford M.D.   On: 10/01/2019 03:32   DG Chest Portable 1 View  Result Date: 10/01/2019 CLINICAL DATA:  Right ankle fracture EXAM:  PORTABLE CHEST 1 VIEW COMPARISON:  11/15/2013 FINDINGS: Cardiac silhouette is normal in size. No mediastinal or hilar masses. Clear lungs. No pleural effusion or pneumothorax. Skeletal structures are grossly intact IMPRESSION: No acute cardiopulmonary disease. Electronically Signed   By: Amie Portland M.D.   On: 10/01/2019 08:15    Assessment and Plan:   Preoperative cardiovascular evaluation, ORIF of R ankle --No active cardiac conditions.  No current CP or s/sx of decompensated HF. Appears well compensated and euvolemic on exam.  She is maintaining NSR with rate well controlled. --Clinical risk factors include h/o chronic diastolic heart failure with EF low normal 50 to 55% as an echo copied above.   --Most recent labs show creatinine 1.56 with BUN 23 but not above 2 mg/dL. --Functional capacity is at least moderate / at least 4 METs on interview today. --Surgery specific risk is intermediate 1-5%, given an orthopedic procedure / ankle surgery. --EKG as above without acute ST/T changes. --Given calculated RCRI 1 / MACE of 1%, no further cardiac testing indicated, and patient may proceed to surgery without further cardiac testing. RCRI calculated Class II risk or 0.9% of major cardiac event. Last dose of Eliquis was at 9PM on 09/30/19. Given her  renal function with Cr 1.56 on most recent labs, would recommend holding anticoagulation for at least 48 hours if procedure was associated with high risk of bleeding. Will defer to orthopedic surgeon regarding anticoagulation and risk of bleeding associated with this specific procedure (ORIF of R ankle), given the patient is at least at moderate risk of bleeding given her last dose of anticoagulation was 9PM last night and within 24 hours of planned surgery. Restart of anticoagulation per orthopedics and when felt safe to do so.  Recommend close monitoring of renal function.  PAF --Maintaining NSR. Continue amiodarone with monitoring of TSH, liver function, and  pulmonary symptoms.  She denies any pulmonary symptoms today. Continue to hold Eliquis in preparation for surgery as above and with restart per orthopedics when felt safe to do so. No reported s/sx of bleeding on OAC. She reports compliance with last dose 9PM yesterday.   Chronic HFpEF --No s/sx of decompensated heart failure.  Euvolemic on exam.  She reports improvement in lower extremity edema with her Lasix and stable symptoms as above in HPI.  Most recent echo as above with low normal EF 50 to 55% and G1 DD.  Continue carvedilol and Entresto as renal function allows and with consideration of latest BMET.  OSA --Continue CPAP.  Essential hypertension --BP mildly elevated in the setting of pain.  Continue to monitor.  Continue current medical management and titrate as needed for BP support.  Hypokalemia, hypomagnesemia  --Replete K with goal 4.0 and Mg with goal 2.0. Repeat BMET to monitor.   Anemia --Hgb 9.9 with HCT 31.5. Recommend further workup per IM given previous Hgb of 12.2 and on anticoagulation to rule out bleed if needed. Daily CBC. Hold OAC given upcoming procedure as above.   AOCKD --Close monitoring of renal function. Cr appears slightly elevated from that of baseline. Caution with contrast and nephrotoxins. Renally dose medications as needed.   For questions or updates, please contact [facility] Please consult [email or web address] for contact info under     Signed, Lennon Alstrom, PA-C  10/01/2019 8:27 AM

## 2019-10-01 NOTE — H&P (Addendum)
History and Physical    Stacey [name] ZOX:[id number] DOB: 12-Jun-1950 DOA: 10/01/2019  Referring MD/NP/PA:   PCP: Marguarite Arbour, MD   Patient coming from:  The patient is coming from home.  At baseline, pt is independent for most of ADL.        Chief Complaint: fall and right ankle pain  HPI: Stacey [name] is a 69 y.o. female with medical history significant of hypertension, hyperlipidemia, GERD, hypothyroidism, anxiety, PAF on Eliquis, OSA on CPAP, CAD, drug-induced torsade de pointes, morbid obesity, dCHF, who presents with fall and right ankle pain.  Patient states that she fell when she was turning to change direction in her home, and her knee buckled at about 12:30 AM. She developed pain in the right ankle, which is constant, sharp, 8 out of 10 severity, nonradiating.  She is unable to bear weight.  She strongly denies any head or neck injury.  She refused doing CT head or neck.  No headache or neck pain.  Does not have chest pain, shortness breath, cough, fever or chills.  She has some mild nausea, but no vomiting, diarrhea, abdominal pain, symptoms of UTI or unilateral numbness or tingling his extremities.  Patient states that her last dose of Eliquis was last night at 9 PM.  ED Course: pt was found to have WBC 6.3, INR 1.4, PTT 32, pending COVID-19 PCR, slightly worsening renal function, potassium 3.3, temperature normal, blood pressure 96/43, heart rate 58, oxygen saturation 95% on room air.  X-ray of her right ankle showed displaced distal fibula fracture. Pt is admitted to MedSurg bed as inpatient.  Orthopedic surgeon, Dr. Joice Lofts is consulted. Dr. Kirke Corin of card is consulted for presurgical clearance.   Review of Systems:   General: no fevers, chills, no body weight gain, has fatigue HEENT: no blurry vision, hearing changes or sore throat Respiratory: no dyspnea, coughing, wheezing CV: no chest pain, no palpitations GI: has nausea, no vomiting, abdominal pain, diarrhea,  constipation GU: no dysuria, burning on urination, increased urinary frequency, hematuria  Ext: has trace leg edema Neuro: no unilateral weakness, numbness, or tingling, no vision change or hearing loss. Has fall. Skin: no rash, no skin tear. MSK: has right ankle pain. Heme: No easy bruising.  Travel history: No recent long distant travel.  Allergy:  Allergies  Allergen Reactions  . Levaquin [Levofloxacin In D5w] Other (See Comments)    Severe dizziness   . Metoprolol Itching, Nausea And Vomiting, Rash and Other (See Comments)    Dizziness  . Tikosyn [Dofetilide] Other (See Comments)    Developed Torsades on Tikosyn 09/28/17  . Ace Inhibitors Cough    Past Medical History:  Diagnosis Date  . Anxiety   . Arthritis    "knees" (09/26/2017)  . Chronic diastolic CHF (congestive heart failure) (HCC)    a. Dx 08/2013 in setting of rapid afib;  b. 08/2013 Echo: EF 55-60%, mildly dil LA, nl RV.  Marland Kitchen Drug-induced torsades de pointes 10/06/2017  . GERD (gastroesophageal reflux disease)   . High cholesterol   . History of blood transfusion 1975   "related to ectopic pregnancy  . Hypertension   . Non-obstructive CAD    a. 10/2013 Myoview: anteroseptal, apical, septal mild ischemia, nl LV fxn;  b. Cath: LAD 21m LCX 20ost, RCA 63m.  . Obesity   . OSA on CPAP   . Paroxysmal atrial fibrillation (HCC)    a. Dx 08/2013. CHA2DS2VASc = 3 (diast chf, htn, female) -->Xarelto initiated; b. 09/2013 s/p  DCCV;  c. 10/2013 recurrent afib->Flecainide added->S/P DCCV;  d. 11/2013 Recurrent afib while off of flecainide (2/2 abnl nuc study)-->Flecainide resumed after nl cath-->DCCV;  e. Recurrent Afib 01/2014-->Flecainide increased to 150mg  BID.  Marland Kitchen Pneumonia    "1-2 times" (09/26/2017)    Past Surgical History:  Procedure Laterality Date  . BREAST BIOPSY Right 2007   stereo-neg  . CARDIAC CATHETERIZATION  10/2013   [facility]  . CARDIOVERSION N/A 08/29/2017   Procedure: CARDIOVERSION;  Surgeon: Wellington Hampshire, MD;   Location: [facility] ORS;  Service: Cardiovascular;  Laterality: N/A;  . CARDIOVERSION     "I've had a total of 4" (09/26/2017)  . CARDIOVERSION N/A 12/08/2018   Procedure: CARDIOVERSION;  Surgeon: Minna Merritts, MD;  Location: [facility] ORS;  Service: Cardiovascular;  Laterality: N/A;  . [place]  . TONSILLECTOMY      Social History:  reports that she has never smoked. She has never used smokeless tobacco. She reports current alcohol use of about 7.0 standard drinks of alcohol per week. She reports that she does not use drugs.  Family History:  Family History  Problem Relation Age of Onset  . CAD Father        s/p cabg - ? h/o afib.  Marland Kitchen Heart attack Father   . Hypertension Mother   . Breast cancer Mother 77     Prior to Admission medications   Medication Sig Start Date End Date Taking? Authorizing Provider  amiodarone (PACERONE) 200 MG tablet TAKE 1 TABLET(200 MG) BY MOUTH DAILY 01/02/19   Deboraha Sprang, MD  apixaban (ELIQUIS) 5 MG TABS tablet Take 1 tablet (5 mg total) by mouth 2 (two) times daily. 05/18/19   Deboraha Sprang, MD  carvedilol (COREG) 12.5 MG tablet TAKE 1 TABLET(12.5 MG) BY MOUTH TWICE DAILY WITH A MEAL 04/10/19   Deboraha Sprang, MD  diclofenac sodium (VOLTAREN) 1 % GEL Apply 1 application topically 3 (three) times daily as needed (knee pain).    [provider]  ENTRESTO 49-51 MG TAKE 1 TABLET BY MOUTH TWICE DAILY 04/23/19   Deboraha Sprang, MD  esomeprazole (NEXIUM) 40 MG capsule TAKE 1 CAPSULE BY MOUTH EVERY MORNING Patient taking differently: Take 40 mg by mouth daily.  12/15/17   Deboraha Sprang, MD  furosemide (LASIX) 40 MG tablet Take 1 tablet (40 mg total) by mouth daily. 07/09/19   Wellington Hampshire, MD  HYDROcodone-acetaminophen (NORCO) 10-325 MG tablet Take 1 tablet by mouth every 6 (six) hours as needed for moderate pain or severe pain.  08/27/16   [provider]  levothyroxine (SYNTHROID) 100 MCG tablet Take 100 mcg by mouth  daily.  10/02/18   [provider]  venlafaxine (EFFEXOR) 75 MG tablet Take 75 mg by mouth 2 (two) times daily.    [provider]  zolpidem (AMBIEN) 10 MG tablet Take 10 mg by mouth at bedtime.     [provider]    Physical Exam: Vitals:   10/01/19 0540 10/01/19 0700 10/01/19 0815 10/01/19 0911  BP: 120/65 137/60  (!) 136/57  Pulse: (!) 58 60 64 64  Resp: 18 14 18 19   Temp:      TempSrc:      SpO2: 95% 95% 91% 97%  Weight:      Height:       General: Not in acute distress HEENT:       Eyes: PERRL, EOMI, no scleral icterus.  ENT: No discharge from the ears and nose, no pharynx injection, no tonsillar enlargement.        Neck: No JVD, no bruit, no mass felt. Heme: No neck lymph node enlargement. Cardiac: S1/S2, RRR, No murmurs, No gallops or rubs. Respiratory:  No rales, wheezing, rhonchi or rubs. GI: Soft, nondistended, nontender, no rebound pain, no organomegaly, BS present. GU: No hematuria Ext: has trace leg edema bilaterally. 1+DP/PT pulse bilaterally. Musculoskeletal: has tenderness in right ankle. Skin: No rashes.  Neuro: Alert, oriented X3, cranial nerves II-XII grossly intact, moves all extremities. Psych: Patient is not psychotic, no suicidal or hemocidal ideation.  Labs on Admission: I have personally reviewed following labs and imaging studies  CBC: Recent Labs  Lab 10/01/19 0702  WBC 6.3  NEUTROABS 4.4  HGB 9.9*  HCT 31.5*  MCV 96.3  PLT 215   Basic Metabolic Panel: Recent Labs  Lab 10/01/19 0702  NA 142  K 3.3*  CL 104  CO2 25  GLUCOSE 96  BUN 23  CREATININE 1.56*  CALCIUM 8.4*  MG 1.6*   GFR: Estimated Creatinine Clearance: 41.3 mL/min (A) (by C-G formula based on SCr of 1.56 mg/dL (H)). Liver Function Tests: No results for input(s): AST, ALT, ALKPHOS, BILITOT, PROT, ALBUMIN in the last 168 hours. No results for input(s): LIPASE, AMYLASE in the last 168 hours. No results for input(s): AMMONIA in the last  168 hours. Coagulation Profile: Recent Labs  Lab 10/01/19 0702  INR 1.4*   Cardiac Enzymes: No results for input(s): CKTOTAL, CKMB, CKMBINDEX, TROPONINI in the last 168 hours. BNP (last 3 results) No results for input(s): PROBNP in the last 8760 hours. HbA1C: No results for input(s): HGBA1C in the last 72 hours. CBG: No results for input(s): GLUCAP in the last 168 hours. Lipid Profile: No results for input(s): CHOL, HDL, LDLCALC, TRIG, CHOLHDL, LDLDIRECT in the last 72 hours. Thyroid Function Tests: No results for input(s): TSH, T4TOTAL, FREET4, T3FREE, THYROIDAB in the last 72 hours. Anemia Panel: No results for input(s): VITAMINB12, FOLATE, FERRITIN, TIBC, IRON, RETICCTPCT in the last 72 hours. Urine analysis: No results found for: COLORURINE, APPEARANCEUR, LABSPEC, PHURINE, GLUCOSEU, HGBUR, BILIRUBINUR, KETONESUR, PROTEINUR, UROBILINOGEN, NITRITE, LEUKOCYTESUR Sepsis Labs: @LABRCNTIP (procalcitonin:4,lacticidven:4) ) Recent Results (from the past 240 hour(s))  SARS Coronavirus 2 by RT PCR (hospital order, performed in [facility] hospital lab) Nasopharyngeal Nasopharyngeal Swab     Status: None   Collection Time: 10/01/19  7:02 AM   Specimen: Nasopharyngeal Swab  Result Value Ref Range Status   SARS Coronavirus 2 NEGATIVE NEGATIVE Final    Comment: (NOTE) SARS-CoV-2 target nucleic acids are NOT DETECTED. The SARS-CoV-2 RNA is generally detectable in upper and lower respiratory specimens during the acute phase of infection. The lowest concentration of SARS-CoV-2 viral copies this assay can detect is 250 copies / mL. A negative result does not preclude SARS-CoV-2 infection and should not be used as the sole basis for treatment or other patient management decisions.  A negative result may occur with improper specimen collection / handling, submission of specimen other than nasopharyngeal swab, presence of viral mutation(s) within the areas targeted by this assay, and  inadequate number of viral copies (<250 copies / mL). A negative result must be combined with clinical observations, patient history, and epidemiological information. Fact Sheet for Patients:   BoilerBrush.com.cy Fact Sheet for Healthcare Providers: https://pope.com/ This test is not yet approved or cleared  by the [place] FDA and has been authorized for detection and/or diagnosis of SARS-CoV-2  by FDA under an Emergency Use Authorization (EUA).  This EUA will remain in effect (meaning this test can be used) for the duration of the COVID-19 declaration under Section 564(b)(1) of the Act, 21 U.S.C. section 360bbb-3(b)(1), unless the authorization is terminated or revoked sooner. Performed at [facility], [address], [place], [place] [address]      Radiological Exams on Admission: DG Ankle 2 Views Right  Result Date: 10/01/2019 CLINICAL DATA:  Post reduction right ankle fracture. EXAM: RIGHT ANKLE - 2 VIEW COMPARISON:  10/01/2019 at 5 a.m. FINDINGS: Alignment is improved from the earlier exam, most evident along lateral view, where the posterior subluxation of the talus in relation to distal tibia has been reduced. Lateral talar subluxation as and mildly improved, with 4-5 mm of lateral subluxation persisting. There is 4 mm of lateral 6 mm of posterior displacement the distal fibular fracture. IMPRESSION: 1. Improved alignment of the ankle joint distal fibular fracture following closed reduction. Electronically Signed   By: Amie Portland M.D.   On: 10/01/2019 08:14   DG Ankle 2 Views Right  Result Date: 10/01/2019 CLINICAL DATA:  Right ankle fracture reduction EXAM: RIGHT ANKLE - 2 VIEW COMPARISON:  Earlier today FINDINGS: Distal diaphysis fracture of the fibula and medial soft tissue injury with lateral and posterior malalignment at the ankle. Posterior displacement with marked anterior joint space widening is worsened from  prior study. A splint has been placed. IMPRESSION: Worsening alignment with interval marked posterior displacement of the ankle. Electronically Signed   By: Marnee Spring M.D.   On: 10/01/2019 05:48   DG Ankle Complete Right  Result Date: 10/01/2019 CLINICAL DATA:  Right ankle pain, swelling, and ecchymosis after fall. EXAM: RIGHT ANKLE - COMPLETE 3+ VIEW COMPARISON:  None. FINDINGS: Displaced distal fibular fracture at and just above the level of the ankle mortise. There is disruption of the ankle mortise with widening of the medial clear space and probable avulsion injury from the medial malleolus. No definite posterior tibial tubercle fracture. There is generalized soft tissue edema. Ankle joint effusion. Plantar calcaneal spur. IMPRESSION: Displaced distal fibular fracture with probable avulsion injury from the medial malleolus. Disruption of the ankle mortise with widening of the medial clear space. No definite posterior malleolar fracture. Electronically Signed   By: Narda Rutherford M.D.   On: 10/01/2019 03:32   DG Chest Portable 1 View  Result Date: 10/01/2019 CLINICAL DATA:  Right ankle fracture EXAM: PORTABLE CHEST 1 VIEW COMPARISON:  11/15/2013 FINDINGS: Cardiac silhouette is normal in size. No mediastinal or hilar masses. Clear lungs. No pleural effusion or pneumothorax. Skeletal structures are grossly intact IMPRESSION: No acute cardiopulmonary disease. Electronically Signed   By: Amie Portland M.D.   On: 10/01/2019 08:15     EKG: Independently reviewed.  Seem to be sinus rhythm, QTC 588, low voltage, nonspecific T wave change  Assessment/Plan Principal Problem:   Closed fracture of right distal fibula Active Problems:   Chronic diastolic CHF (congestive heart failure) (HCC)   GERD (gastroesophageal reflux disease)   Atrial fibrillation (HCC)   Coronary artery disease   Sleep apnea   Essential hypertension   Hypothyroidism   Normocytic anemia   CKD (chronic kidney disease),  stage IIIa   Morbid obesity with BMI of 40.0-44.9, adult (HCC)   Closed fracture of right distal fibula: As evidenced by x-ray. Patient has moderate pain now. No neurovascular compromise. Orthopedic surgeon, Dr. Joice Lofts was consulted.  Given her significant cardiac history, cardiology, Dr. Kirke Corin  is also consulted for presurgical clearance.  - will admit to Med-surg bed as inpt - Pain control: morphine prn and percocet - When necessary Zofran for nausea - Robaxin for muscle spasm - type and cross - INR/PTT - PT/OT when able to  Chronic diastolic CHF (congestive heart failure) (HCC): 2D echo on 12/06/2017 showed EF of 50-50% with grade 1 diastolic dysfunction.  Patient has trace leg edema, but no shortness of breath.  CHF is compensated. -Continue home dose Lasix -Check BNP -->328  GERD (gastroesophageal reflux disease) -protonix  Atrial fibrillation (HCC): -Hold Eliquis now -->restart in AM -continue amiodarone and Coreg  Coronary artery disease: no CP -on crestor   Sleep apnea -CPAP  HTN:  -Continue home medications: Coreg -Patient is also on Lasix -hydralazine prn  Hypothyroidism: TSH 1.482 on 07/11/2019 -Synthroid  Normocytic anemia: hgb 10.5 on 07/11/2019 --> 9.9 today. -Follow-up with CBC  CKD (chronic kidney disease), stage IIIa: Baseline creatinine 1.2-1.4.  Her creatinine is 1.56, BUN 23, slightly worsening than baseline. -Follow-up by BMP  Hypokalemia: K= 3.3 on admission. - Repleted - Check Mg level  Addendum: Mg 1.6 -will give 2 g magnesium sulfate IV  Morbid obesity with BMI of 40.0-44.9, adult (HCC) -Diet and exercise.   -Encouraged to lose weight.         DVT ppx: SCD -->start Eliquis in AM Code Status: Full code Family Communication: not done, no family member is at bed side.   Disposition Plan:  Anticipate discharge back to previous environment Consults called:  Dr. Joice Lofts of ortho and Dr. Kirke Corin of Card Admission status: Med-surg bed as inpt         Status is: Inpatient  Remains inpatient appropriate because:Inpatient level of care appropriate due to severity of illness   Dispo: The patient is from: Home              Anticipated d/c is to: to be determined              Anticipated d/c date is: 2 days              Patient currently is not medically stable to d/c.           Date of Service 10/01/2019    Lorretta Harp [facility]   If 7PM-7AM, please contact night-coverage [email or web address] 10/01/2019, 9:18 AM

## 2019-10-01 NOTE — Anesthesia Postprocedure Evaluation (Signed)
Anesthesia Post Note  Patient: Tanyah Debruyne  Procedure(s) Performed: OPEN REDUCTION INTERNAL FIXATION (ORIF) ANKLE FRACTURE (Right Ankle)  Patient location during evaluation: PACU Anesthesia Type: General Level of consciousness: awake and alert Pain management: pain level controlled Vital Signs Assessment: post-procedure vital signs reviewed and stable Respiratory status: spontaneous breathing, nonlabored ventilation, respiratory function stable and patient connected to nasal cannula oxygen Cardiovascular status: blood pressure returned to baseline and stable Postop Assessment: no apparent nausea or vomiting Anesthetic complications: no     Last Vitals:  Vitals:   10/01/19 1417 10/01/19 1429  BP: 122/62 132/63  Pulse: 65 63  Resp: 20 17  Temp:    SpO2: 92% 94%    Last Pain:  Vitals:   10/01/19 1417  TempSrc:   PainSc: 0-No pain                 Cleda Mccreedy Chaise Passarella

## 2019-10-01 NOTE — Progress Notes (Signed)
Pt's home cpap was checked and no frayed cords were noted. The pt's husband said it is in good working order. Call into biomed.

## 2019-10-01 NOTE — Progress Notes (Signed)
OT Cancellation Note  Patient Details Name: Stacey [name] MRN: [id number] DOB: [birth year]   Cancelled Treatment:    Reason Eval/Treat Not Completed: Patient at procedure or test/ unavailable  OT consult received and chart reviewed. Pt off floor to OR at this time as part of cardiac w/u prior to ankle sx. Will f/u at later date/time as able for OT evaluation. Thank you.  Rejeana Brock, MS, OTR/L ascom [phone] 10/01/19, 11:05 AM

## 2019-10-01 NOTE — Anesthesia Preprocedure Evaluation (Signed)
Anesthesia Evaluation  Patient identified by MRN, date of birth, ID band Patient awake    Reviewed: Allergy & Precautions, NPO status , Patient's Chart, lab work & pertinent test results  History of Anesthesia Complications Negative for: history of anesthetic complications  Airway Mallampati: III  TM Distance: >3 FB Neck ROM: Full    Dental no notable dental hx.    Pulmonary sleep apnea and Continuous Positive Airway Pressure Ventilation , neg COPD,    breath sounds clear to auscultation- rhonchi (-) wheezing      Cardiovascular hypertension, Pt. on medications + CAD and +CHF  (-) Past MI, (-) Cardiac Stents and (-) CABG + dysrhythmias Atrial Fibrillation  Rhythm:Regular Rate:Normal - Systolic murmurs and - Diastolic murmurs Echo 12/06/17: - Left ventricle: Systolic function was normal. The estimated  ejection fraction was in the range of 50% to 55%. Doppler  parameters are consistent with abnormal left ventricular  relaxation (grade 1 diastolic dysfunction).  - Left atrium: The atrium was mildly dilated.  - Right ventricle: The cavity size was normal. Systolic function  was normal.  - Right atrium: The atrium was mildly dilated.    Neuro/Psych neg Seizures PSYCHIATRIC DISORDERS Anxiety Depression negative neurological ROS     GI/Hepatic   Endo/Other    Renal/GU      Musculoskeletal   Abdominal (+) + obese,   Peds  Hematology   Anesthesia Other Findings Past Medical History: No date: Anxiety No date: Arthritis     Comment:  "knees" (09/26/2017) No date: Chronic diastolic CHF (congestive heart failure) (HCC)     Comment:  a. Dx 08/2013 in setting of rapid afib;  b. 08/2013 Echo:               EF 55-60%, mildly dil LA, nl RV. 10/06/2017: Drug-induced torsades de pointes No date: GERD (gastroesophageal reflux disease) No date: High cholesterol 1975: History of blood transfusion     Comment:  "related to  ectopic pregnancy No date: Hypertension No date: Non-obstructive CAD     Comment:  a. 10/2013 Myoview: anteroseptal, apical, septal mild               ischemia, nl LV fxn;  b. Cath: LAD 82m LCX 20ost, RCA               66m. No date: Obesity No date: OSA on CPAP No date: Paroxysmal atrial fibrillation (HCC)     Comment:  a. Dx 08/2013. CHA2DS2VASc = 3 (diast chf, htn, female)               -->Xarelto initiated; b. 09/2013 s/p DCCV;  c. 10/2013               recurrent afib->Flecainide added->S/P DCCV;  d. 11/2013               Recurrent afib while off of flecainide (2/2 abnl nuc               study)-->Flecainide resumed after nl cath-->DCCV;  e.               Recurrent Afib 01/2014-->Flecainide increased to 150mg                BID. No date: Pneumonia     Comment:  "1-2 times" (09/26/2017)   Reproductive/Obstetrics                             Anesthesia Physical Anesthesia Plan  ASA: III  Anesthesia Plan: General   Post-op Pain Management:    Induction: Intravenous  PONV Risk Score and Plan: 2 and Ondansetron and Dexamethasone  Airway Management Planned: Oral ETT  Additional Equipment:   Intra-op Plan:   Post-operative Plan: Extubation in OR  Informed Consent: I have reviewed the patients History and Physical, chart, labs and discussed the procedure including the risks, benefits and alternatives for the proposed anesthesia with the patient or authorized representative who has indicated his/her understanding and acceptance.     Dental advisory given  Plan Discussed with: CRNA and Anesthesiologist  Anesthesia Plan Comments: (Evaluated by cardiology and cleared to proceed without any further cardiac testing)        Anesthesia Quick Evaluation

## 2019-10-01 NOTE — ED Provider Notes (Signed)
[facility] Emergency Department Provider Note  ____________________________________________  Time seen: Approximately 5:41 AM  I have reviewed the triage vital signs and the nursing notes.   HISTORY  Chief Complaint Ankle Pain    HPI Stacey [name] is a 69 y.o. female with a history of CHF, GERD, hypertension, OSA, atrial fibrillation on Eliquis who was in her usual state of health when she was turning to change direction in her home when her knee felt like it buckled causing her to fall awkwardly over her right ankle.  Immediate onset of pain, severe, nonradiating, worse with movement, unable to bear weight.  No alleviating factors.  No other injuries.  No head injury or loss of consciousness.  No chest pain or shortness of breath.      Past Medical History:  Diagnosis Date  . Anxiety   . Arthritis    "knees" (09/26/2017)  . Chronic diastolic CHF (congestive heart failure) (HCC)    a. Dx 08/2013 in setting of rapid afib;  b. 08/2013 Echo: EF 55-60%, mildly dil LA, nl RV.  Marland Kitchen Drug-induced torsades de pointes 10/06/2017  . GERD (gastroesophageal reflux disease)   . High cholesterol   . History of blood transfusion 1975   "related to ectopic pregnancy  . Hypertension   . Non-obstructive CAD    a. 10/2013 Myoview: anteroseptal, apical, septal mild ischemia, nl LV fxn;  b. Cath: LAD 53m LCX 20ost, RCA 54m.  . Obesity   . OSA on CPAP   . Paroxysmal atrial fibrillation (HCC)    a. Dx 08/2013. CHA2DS2VASc = 3 (diast chf, htn, female) -->Xarelto initiated; b. 09/2013 s/p DCCV;  c. 10/2013 recurrent afib->Flecainide added->S/P DCCV;  d. 11/2013 Recurrent afib while off of flecainide (2/2 abnl nuc study)-->Flecainide resumed after nl cath-->DCCV;  e. Recurrent Afib 01/2014-->Flecainide increased to 150mg  BID.  Pneumonia    "1-2 times" (09/26/2017)     Patient Active Problem List   Diagnosis Date Noted  . Drug-induced torsades de pointes 10/06/2017  . Persistent  atrial fibrillation (HCC) 09/26/2017  . Essential hypertension 09/16/2014  . Sleep apnea 12/20/2013  . Coronary artery disease   . Chronic diastolic CHF (congestive heart failure) (HCC)   . Obesity   . GERD (gastroesophageal reflux disease)   . Hypertensive cardiovascular disease   . Depression   . Presumed Obstructive Sleep Apnea   . Atrial fibrillation [facility])      Past Surgical History:  Procedure Laterality Date  . BREAST BIOPSY Right 2007   stereo-neg  . CARDIAC CATHETERIZATION  10/2013   [facility]  . CARDIOVERSION N/A 08/29/2017   Procedure: CARDIOVERSION;  Surgeon: 10/29/2017, MD;  Location: [facility] ORS;  Service: Cardiovascular;  Laterality: N/A;  . CARDIOVERSION     "I've had a total of 4" (09/26/2017)  . CARDIOVERSION N/A 12/08/2018   Procedure: CARDIOVERSION;  Surgeon: 12/10/2018, MD;  Location: [facility] ORS;  Service: Cardiovascular;  Laterality: N/A;  . ECTOPIC PREGNANCY SURGERY  1975  . TONSILLECTOMY       Prior to Admission medications   Medication Sig Start Date End Date Taking? Authorizing Provider  amiodarone (PACERONE) 200 MG tablet TAKE 1 TABLET(200 MG) BY MOUTH DAILY 01/02/19   03/04/19, MD  apixaban (ELIQUIS) 5 MG TABS tablet Take 1 tablet (5 mg total) by mouth 2 (two) times daily. 05/18/19   05/20/19, MD  carvedilol (COREG) 12.5 MG tablet TAKE 1 TABLET(12.5 MG) BY MOUTH TWICE DAILY WITH A MEAL  04/10/19   Duke Salvia, MD  diclofenac sodium (VOLTAREN) 1 % GEL Apply 1 application topically 3 (three) times daily as needed (knee pain).    [provider]  ENTRESTO 49-51 MG TAKE 1 TABLET BY MOUTH TWICE DAILY 04/23/19   Duke Salvia, MD  esomeprazole (NEXIUM) 40 MG capsule TAKE 1 CAPSULE BY MOUTH EVERY MORNING Patient taking differently: Take 40 mg by mouth daily.  12/15/17   Duke Salvia, MD  furosemide (LASIX) 40 MG tablet Take 1 tablet (40 mg total) by mouth daily. 07/09/19   Iran Ouch, MD  HYDROcodone-acetaminophen (NORCO)  10-325 MG tablet Take 1 tablet by mouth every 6 (six) hours as needed for moderate pain or severe pain.  08/27/16   [provider]  levothyroxine (SYNTHROID) 100 MCG tablet Take 100 mcg by mouth daily.  10/02/18   [provider]  venlafaxine (EFFEXOR) 75 MG tablet Take 75 mg by mouth 2 (two) times daily.    [provider]  zolpidem (AMBIEN) 10 MG tablet Take 10 mg by mouth at bedtime.     [provider]     Allergies Levaquin [levofloxacin in d5w], Metoprolol, Tikosyn [dofetilide], and Ace inhibitors   Family History  Problem Relation Age of Onset  . CAD Father        s/p cabg - ? h/o afib.  Marland Kitchen Heart attack Father   . Hypertension Mother   . Breast cancer Mother 8    Social History Social History   Tobacco Use  . Smoking status: Never Smoker  . Smokeless tobacco: Never Used  Substance Use Topics  . Alcohol use: Yes    Alcohol/week: 7.0 standard drinks    Types: 7 Glasses of wine per week    Comment: 1 glass of wine/day.  . Drug use: Never    Review of Systems  Constitutional:   No fever or chills.  ENT:   No sore throat. No rhinorrhea. Cardiovascular:   No chest pain or syncope. Respiratory:   No dyspnea or cough. Gastrointestinal:   Negative for abdominal pain, vomiting and diarrhea.  Musculoskeletal:   Right ankle pain and swelling as above All other systems reviewed and are negative except as documented above in ROS and HPI.  ____________________________________________   PHYSICAL EXAM:  VITAL SIGNS: ED Triage Vitals  Enc Vitals Group     BP 10/01/19 0300 (!) 96/43     Pulse Rate 10/01/19 0300 60     Resp 10/01/19 0302 20     Temp 10/01/19 0302 98.2 F (36.8 C)     Temp Source 10/01/19 0302 Oral     SpO2 10/01/19 0300 97 %     Weight 10/01/19 0303 239 lb (108.4 kg)     Height 10/01/19 0303 5\' 3"  (1.6 m)     Head Circumference --      Peak Flow --      Pain Score 10/01/19 0303 8     Pain Loc --      Pain Edu? --       Excl. in GC? --     Vital signs reviewed, nursing assessments reviewed.   Constitutional:   Alert and oriented. Non-toxic appearance. Eyes:   Conjunctivae are normal. EOMI. PERRL. ENT      Head:   Normocephalic and atraumatic.      Nose:   Wearing a mask.      Mouth/Throat:   Wearing a mask.      Neck:  No meningismus. Full ROM. Hematological/Lymphatic/Immunilogical:   No cervical lymphadenopathy. Cardiovascular:   RRR. Symmetric bilateral radial and DP pulses.  No murmurs. Cap refill less than 2 seconds. Respiratory:   Normal respiratory effort without tachypnea/retractions. Breath sounds are clear and equal bilaterally. No wheezes/rales/rhonchi. Gastrointestinal:   Soft and nontender. Non distended. There is no CVA tenderness.  No rebound, rigidity, or guarding. Musculoskeletal: Obvious right ankle deformity with ecchymosis.  Tenderness over bilateral malleoli.  No crepitus or laceration.  Distal perfusion and tendon function intact.  No tenderness about the knee or proximal fibula. Neurologic:   Normal speech and language.  Motor grossly intact. No acute focal neurologic deficits are appreciated.  Skin:    Skin is warm, dry and intact. No rash noted.  No petechiae, purpura, or bullae.  ____________________________________________    LABS (pertinent positives/negatives) (all labs ordered are listed, but only abnormal results are displayed) Labs Reviewed  CBC WITH DIFFERENTIAL/PLATELET - Abnormal; Notable for the following components:      Result Value   RBC 3.27 (*)    Hemoglobin 9.9 (*)    HCT 31.5 (*)    RDW 16.1 (*)    All other components within normal limits  SARS CORONAVIRUS 2 BY RT PCR (HOSPITAL ORDER, PERFORMED IN [facility])  BASIC METABOLIC PANEL  PROTIME-INR  APTT  TYPE AND SCREEN   ____________________________________________   EKG Interpreted by me Sinus rhythm rate of 59, normal axis and intervals.  Poor R wave progression.  Normal  ST segments and T waves.   ____________________________________________    RADIOLOGY  DG Ankle 2 Views Right  Result Date: 10/01/2019 CLINICAL DATA:  Right ankle fracture reduction EXAM: RIGHT ANKLE - 2 VIEW COMPARISON:  Earlier today FINDINGS: Distal diaphysis fracture of the fibula and medial soft tissue injury with lateral and posterior malalignment at the ankle. Posterior displacement with marked anterior joint space widening is worsened from prior study. A splint has been placed. IMPRESSION: Worsening alignment with interval marked posterior displacement of the ankle. Electronically Signed   By: Marnee Spring M.D.   On: 10/01/2019 05:48   DG Ankle Complete Right  Result Date: 10/01/2019 CLINICAL DATA:  Right ankle pain, swelling, and ecchymosis after fall. EXAM: RIGHT ANKLE - COMPLETE 3+ VIEW COMPARISON:  None. FINDINGS: Displaced distal fibular fracture at and just above the level of the ankle mortise. There is disruption of the ankle mortise with widening of the medial clear space and probable avulsion injury from the medial malleolus. No definite posterior tibial tubercle fracture. There is generalized soft tissue edema. Ankle joint effusion. Plantar calcaneal spur. IMPRESSION: Displaced distal fibular fracture with probable avulsion injury from the medial malleolus. Disruption of the ankle mortise with widening of the medial clear space. No definite posterior malleolar fracture. Electronically Signed   By: Narda Rutherford M.D.   On: 10/01/2019 03:32    ____________________________________________   PROCEDURES .Ortho Injury Treatment  Date/Time: 10/01/2019 5:46 AM Performed by: Sharman Cheek, MD Authorized by: Sharman Cheek, MD   Consent:    Consent obtained:  Verbal   Consent given by:  Patient   Risks discussed:  Stiffness and restricted joint movement   Alternatives discussed:  No treatmentInjury location: ankle Location details: right ankle Injury type:  fracture Fracture type: lateral malleolus Pre-procedure neurovascular assessment: neurovascularly intact Pre-procedure distal perfusion: normal Pre-procedure neurological function: normal Pre-procedure range of motion: reduced  Anesthesia: Local anesthesia used: no  Patient sedated: NoManipulation performed: no Immobilization: splint Splint type: sugar  tong and short leg Supplies used: cotton padding,  elastic bandage and Ortho-Glass Post-procedure neurovascular assessment: post-procedure neurovascularly intact Post-procedure distal perfusion: normal Post-procedure neurological function: normal Post-procedure range of motion: unchanged Patient tolerance: patient tolerated the procedure well with no immediate complications     ____________________________________________    CLINICAL IMPRESSION / ASSESSMENT AND PLAN / ED COURSE  Medications ordered in the ED: Medications  ceFAZolin (ANCEF) 3 g in dextrose 5 % 50 mL IVPB (has no administration in time range)  Tdap (BOOSTRIX) injection 0.5 mL (0.5 mLs Intramuscular Given 10/01/19 0333)  morphine 4 MG/ML injection 4 mg (4 mg Intravenous Given 10/01/19 0333)  fentaNYL (SUBLIMAZE) injection 100 mcg (100 mcg Intravenous Given 10/01/19 0428)  ondansetron (ZOFRAN) injection 4 mg (4 mg Intravenous Given 10/01/19 0459)    Pertinent labs & imaging results that were available during my care of the patient were reviewed by me and considered in my medical decision making (see chart for details).  Stacey [name] was evaluated in Emergency Department on 10/01/2019 for the symptoms described in the history of present illness. She was evaluated in the context of the global COVID-19 pandemic, which necessitated consideration that the patient might be at risk for infection with the SARS-CoV-2 virus that causes COVID-19. Institutional protocols and algorithms that pertain to the evaluation of patients at risk for COVID-19 are in a state of rapid change based  on information released by regulatory bodies including the CDC and federal and state organizations. These policies and algorithms were followed during the patient's care in the ED.   Patient presents with clinically apparent right ankle fracture.  Will obtain x-ray.  Give morphine 4 mg intramuscular for initial fracture pain management.  Clinical Course as of Sep 30 716  Mon Oct 01, 2019  0330 X-ray ankle interpreted by me, shows acute displaced fibular fracture, unstable.  There is lateral translation of the   [PS]  0355 Radiology report reviewed, confirming findings of unstable fibular fracture.  Discussed with orthopedics Dr. Roland Rack who recommends 3 sided splint, close clinic follow-up, walker for ambulation assistance.   [PS]  0449 Splint applied. Pt and daughter at bedside report concern that pt's home environment will be challenging for her to care for self, insufficient support available to maintain ADLs. Will consult PT and SW   [PS]  0543 Repeat x-ray interpreted by me, shows minimal improvement and lateral translation of the mortise on the tibia.  There is new posterior subluxation.  Given ankle joint instability from the injury, unlikely that alignment can be maintained in a more anatomic position.  We will keep splint in place.   [PS]  [id number] Repeat xray d/w Dr. Roland Rack, who recommends admission given very unstable ankle. Will try to repeat reduction and splinting, obtain covid screening.    [PS]    Clinical Course User Index [PS] Carrie Mew, MD     ----------------------------------------- 7:50 AM on 10/01/2019 -----------------------------------------  Repeat reduction and splinting performed.  Will repeat x-ray.  ____________________________________________   FINAL CLINICAL IMPRESSION(S) / ED DIAGNOSES    Final diagnoses:  Closed displaced oblique fracture of shaft of right fibula, initial encounter     ED Discharge Orders    None      Portions of this  note were generated with dragon dictation software. Dictation errors may occur despite best attempts at proofreading.   Carrie Mew, MD 10/01/19 [phone]

## 2019-10-02 LAB — BASIC METABOLIC PANEL
Anion gap: 9 (ref 5–15)
BUN: 23 mg/dL (ref 8–23)
CO2: 23 mmol/L (ref 22–32)
Calcium: 8 mg/dL — ABNORMAL LOW (ref 8.9–10.3)
Chloride: 108 mmol/L (ref 98–111)
Creatinine, Ser: 1.32 mg/dL — ABNORMAL HIGH (ref 0.44–1.00)
GFR calc Af Amer: 48 mL/min — ABNORMAL LOW (ref >60)
GFR calc non Af Amer: 42 mL/min — ABNORMAL LOW (ref >60)
Glucose, Bld: 148 mg/dL — ABNORMAL HIGH (ref 70–99)
Potassium: 4.4 mmol/L (ref 3.5–5.1)
Sodium: 140 mmol/L (ref 135–145)

## 2019-10-02 LAB — CBC
HCT: 30.9 % — ABNORMAL LOW (ref 36.0–46.0)
Hemoglobin: 9.5 g/dL — ABNORMAL LOW (ref 12.0–15.0)
MCH: 29.9 pg (ref 26.0–34.0)
MCHC: 30.7 g/dL (ref 30.0–36.0)
MCV: 97.2 fL (ref 80.0–100.0)
Platelets: 197 10*3/uL (ref 150–400)
RBC: 3.18 MIL/uL — ABNORMAL LOW (ref 3.87–5.11)
RDW: 16.2 % — ABNORMAL HIGH (ref 11.5–15.5)
WBC: 7.6 10*3/uL (ref 4.0–10.5)
nRBC: 0 % (ref 0.0–0.2)

## 2019-10-02 LAB — HIV ANTIBODY (ROUTINE TESTING W REFLEX): HIV Screen 4th Generation wRfx: NONREACTIVE

## 2019-10-02 MED ORDER — PANTOPRAZOLE SODIUM 40 MG PO TBEC
40.0000 mg | DELAYED_RELEASE_TABLET | Freq: Every day | ORAL | Status: DC
  Administered 2019-10-02 – 2019-10-04 (×3): 40 mg via ORAL
  Filled 2019-10-02 (×3): qty 1

## 2019-10-02 MED ORDER — FUROSEMIDE 40 MG PO TABS
40.0000 mg | ORAL_TABLET | Freq: Every day | ORAL | Status: DC
  Administered 2019-10-02: 40 mg via ORAL
  Filled 2019-10-02: qty 1

## 2019-10-02 MED ORDER — VENLAFAXINE HCL 37.5 MG PO TABS
75.0000 mg | ORAL_TABLET | Freq: Two times a day (BID) | ORAL | Status: DC
  Administered 2019-10-04: 75 mg via ORAL
  Filled 2019-10-02 (×6): qty 2

## 2019-10-02 MED ORDER — ROSUVASTATIN CALCIUM 10 MG PO TABS
10.0000 mg | ORAL_TABLET | Freq: Every day | ORAL | Status: DC
  Administered 2019-10-02 – 2019-10-03 (×2): 10 mg via ORAL
  Filled 2019-10-02 (×2): qty 1

## 2019-10-02 MED ORDER — AMIODARONE HCL 200 MG PO TABS: 200.0000 mg | ORAL_TABLET | Freq: Every day | ORAL | Status: DC

## 2019-10-02 MED ORDER — ZOLPIDEM TARTRATE 5 MG PO TABS: 5.0000 mg | ORAL_TABLET | Freq: Every day | ORAL | Status: DC

## 2019-10-02 MED ORDER — CARVEDILOL 12.5 MG PO TABS
12.5000 mg | ORAL_TABLET | Freq: Two times a day (BID) | ORAL | Status: DC
  Administered 2019-10-02: 12.5 mg via ORAL
  Filled 2019-10-02: qty 1
  Filled 2019-10-02: qty 4

## 2019-10-02 MED ORDER — LEVOTHYROXINE SODIUM 100 MCG PO TABS
100.0000 ug | ORAL_TABLET | Freq: Every day | ORAL | Status: DC
  Administered 2019-10-02 – 2019-10-04 (×3): 100 ug via ORAL
  Filled 2019-10-02 (×3): qty 1

## 2019-10-02 MED ORDER — OXYCODONE HCL 5 MG PO TABS: 5.0000 mg | ORAL_TABLET | ORAL | 0 refills | Status: DC | PRN

## 2019-10-02 NOTE — Progress Notes (Signed)
Physical Therapy Treatment Patient Details Name: Stacey [name] MRN: [id number] DOB: 12/04/1950 Today's Date: 10/02/2019    History of Present Illness 69 y/o female s/p fall at home with R fibular fracture; ORIF 6/7. PMH includes: HTN, HLD, and GERD.    PT Comments    Pt continues to be very hesitant to attempt a lot of standing mobility and requests to again be in the bed post session but she ultimately did show good effort with the limited amount of standing/unweighting that she was able to manage at EOB.  She showed good awareness of trying to keep weight off R heel, but simply does not have the upper body strength and general endurance to tolerate much standing activity.  Pt with increased strength/AROM/quality of motion with exercises this afternoon.   Follow Up Recommendations  SNF     Equipment Recommendations  Rolling walker with 5" wheels;3in1 (PT)    Recommendations for Other Services       Precautions / Restrictions Precautions Precautions: Fall Restrictions Weight Bearing Restrictions: Yes RLE Weight Bearing: Non weight bearing    Mobility  Bed Mobility Overal bed mobility: Modified Independent             General bed mobility comments: Pt better able to get LEs off EOB and get to sitting, some minimal incresed time to get LEs back into bed  Transfers Overall transfer level: Needs assistance Equipment used: Rolling walker (2 wheeled) Transfers: Sit to/from Stand Sit to Stand: Min guard         General transfer comment: with bed at standard height and plenty of cuing for UE use and set up she was able to slowly and with heavy effort attain standing w/o assist.  She was able to keep R heel off the ground during the transition  Ambulation/Gait             General Gait Details: Still unable to unweight enough with UEs on walker to hop, but did manage some minimal L foot unweighting while R foot was of ground and/or only minimally resting on the heel.   ~3 small side shift heel-toe unweighting at [employer]   Stairs             Wheelchair Mobility    Modified Rankin (Stroke Patients Only)       Balance Overall balance assessment: Needs assistance Sitting-balance support: No upper extremity supported Sitting balance-Leahy Scale: Good Sitting balance - Comments: Good/confident sitting EOB balanace, some R foot pain in gravity dependent position     Standing balance-Leahy Scale: Poor Standing balance comment: Pt able to manage static standing at EOB reasonably well with some struggle to keep R foot off floor.  However fatigues very quickly with any UE use to unweight and not confident with repeated UE/tricep pushes on walker                            Cognition Arousal/Alertness: Awake/alert Behavior During Therapy: WFL for tasks assessed/performed Overall Cognitive Status: Within Functional Limits for tasks assessed                                        Exercises General Exercises - Lower Extremity Ankle Circles/Pumps: AROM;10 reps Quad Sets: Strengthening;10 reps Heel Slides: AROM;10 reps Hip ABduction/ADduction: Strengthening;10 reps Straight Leg Raises: AROM;10 reps Other Exercises Other Exercises: OT facilitates pt  participation in 1 set x10 reps modified chair/tricep push ups to increase strength for bearing weight through UEs with RW to increase ability to tolerate fxl mobility w/o being able to bear wt to R LE. Pt with good understanding and req's only min cues for technique and pace.    General Comments        Pertinent Vitals/Pain Pain Assessment: 0-10 Pain Score: 6  Pain Location: R leg Pain Descriptors / Indicators: Grimacing;Guarding;Aching Pain Intervention(s): Limited activity within patient's tolerance;Monitored during session;Patient requesting pain meds-RN notified    Home Living Family/patient expects to be discharged to:: Skilled nursing facility Living Arrangements:  Spouse/significant other Available Help at Discharge: Family   Home Access: Stairs to enter Entrance Stairs-Rails: Can reach both Home Layout: 1/2 bath on main level;Bed/bath upstairs;Two level Home Equipment: None      Prior Function Level of Independence: Independent      Comments: Pt with some chronic R>L knee OA pain but able to run errands, etc   PT Goals (current goals can now be found in the care plan section) Acute Rehab PT Goals Patient Stated Goal: get back to walking well Progress towards PT goals: Progressing toward goals    Frequency    BID      PT Plan Current plan remains appropriate    Co-evaluation              AM-PAC PT "6 Clicks" Mobility   Outcome Measure  Help needed turning from your back to your side while in a flat bed without using bedrails?: A Little Help needed moving from lying on your back to sitting on the side of a flat bed without using bedrails?: A Little Help needed moving to and from a bed to a chair (including a wheelchair)?: A Lot Help needed standing up from a chair using your arms (e.g., wheelchair or bedside chair)?: A Lot Help needed to walk in hospital room?: Total Help needed climbing 3-5 steps with a railing? : Total 6 Click Score: 12    End of Session Equipment Utilized During Treatment: Gait belt Activity Tolerance: Patient limited by pain;Patient limited by fatigue Patient left: with bed alarm set;with call bell/phone within reach;with family/visitor present   PT Visit Diagnosis: Muscle weakness (generalized) (M62.81);Difficulty in walking, not elsewhere classified (R26.2)     Time: 6659-9357 PT Time Calculation (min) (ACUTE ONLY): 30 min  Charges:  $Therapeutic Exercise: 8-22 mins $Therapeutic Activity: 8-22 mins                     Malachi Pro, DPT 10/02/2019, 5:56 PM

## 2019-10-02 NOTE — TOC Initial Note (Signed)
Transition of Care [facility]) - Initial/Assessment Note    Patient Details  Name: Stacey [name] MRN: [id number] Date of Birth: [birth year]  Transition of [facility]) CM/SW Contact:    Barrie Dunker, RN Phone Number: 10/02/2019, 12:15 PM  Clinical Narrative:                  Spoke with the patient to discuss DC plan and needs, she lives at home with her husband and is agreeable to go to SNF, she prefers [facility] or [facility] [employer] [facility], I completed the [employer] and PASSR and sent a bed search, will review once obtainaed       Patient Goals and CMS Choice        Expected Discharge Plan and Services                                                Prior Living Arrangements/Services                       Activities of Daily Living Home Assistive Devices/Equipment: CPAP, Eyeglasses ADL Screening (condition at time of admission) Patient's cognitive ability adequate to safely complete daily activities?: Yes Is the patient deaf or have difficulty hearing?: No Does the patient have difficulty seeing, even when wearing glasses/contacts?: No Does the patient have difficulty concentrating, remembering, or making decisions?: No Patient able to express need for assistance with ADLs?: Yes Does the patient have difficulty dressing or bathing?: No Independently performs ADLs?: Yes (appropriate for developmental age) Does the patient have difficulty walking or climbing stairs?: Yes Weakness of Legs: Right Weakness of Arms/Hands: None  Permission Sought/Granted                  Emotional Assessment              Admission diagnosis:  Chronic diastolic heart failure (HCC) [I50.32] Surgery, elective [Z41.9] Paroxysmal atrial fibrillation (HCC) [I48.0] Closed fracture of right distal fibula [S82.831A] Closed displaced oblique fracture of shaft of right fibula, initial encounter [S82.431A] On continuous oral anticoagulation [Z79.01] Patient Active Problem List    Diagnosis Date Noted  . Hypothyroidism 10/01/2019  . Normocytic anemia 10/01/2019  . CKD (chronic kidney disease), stage IIIa 10/01/2019  . Closed fracture of right distal fibula 10/01/2019  . Morbid obesity with BMI of 40.0-44.9, adult (HCC) 10/01/2019  . Preop cardiovascular exam   . Drug-induced torsades de pointes 10/06/2017  . Persistent atrial fibrillation (HCC) 09/26/2017  . Essential hypertension 09/16/2014  . Sleep apnea 12/20/2013  . Coronary artery disease   . Chronic diastolic CHF (congestive heart failure) (HCC)   . Obesity   . GERD (gastroesophageal reflux disease)   . Hypertensive cardiovascular disease   . Depression   . Presumed Obstructive Sleep Apnea   . Atrial fibrillation (HCC)    PCP:  Marguarite Arbour, MD Pharmacy:   [id number] - [place], [place] - [address] AT [facility] OF [address] [place] [address] Phone: [phone] Fax: [phone]     Social Determinants of Health (SDOH) Interventions    Readmission Risk Interventions No flowsheet data found.

## 2019-10-02 NOTE — Plan of Care (Signed)
  Problem: Education: Goal: Knowledge of General Education information will improve Description: Including pain rating scale, medication(s)/side effects and non-pharmacologic comfort measures Outcome: Progressing   Problem: Clinical Measurements: Goal: Ability to maintain clinical measurements within normal limits will improve Outcome: Progressing Goal: Will remain free from infection Outcome: Progressing Goal: Diagnostic test results will improve Outcome: Progressing Goal: Respiratory complications will improve Outcome: Progressing Goal: Cardiovascular complication will be avoided Outcome: Progressing   Problem: Activity: Goal: Risk for activity intolerance will decrease Outcome: Progressing   Problem: Pain Managment: Goal: General experience of comfort will improve Outcome: Progressing   Problem: Safety: Goal: Ability to remain free from injury will improve Outcome: Progressing   

## 2019-10-02 NOTE — Progress Notes (Signed)
Subjective: 1 Day Post-Op Procedure(s) (LRB): OPEN REDUCTION INTERNAL FIXATION (ORIF) ANKLE FRACTURE (Right) Patient reports pain as mild.   Patient is well, and has had no acute complaints or problems Plan is to go Skilled nursing facility after hospital stay. Negative for chest pain and shortness of breath Fever: no Gastrointestinal:Negative for nausea and vomiting  Objective: Vital signs in last 24 hours: Temp:  [98 F (36.7 C)-100 F (37.8 C)] 98.8 F (37.1 C) (06/08 0736) Pulse Rate:  [58-68] 58 (06/08 0736) Resp:  [15-21] 16 (06/08 0736) BP: (103-148)/(40-68) 131/55 (06/08 0736) SpO2:  [92 %-100 %] 99 % (06/08 0736)  Intake/Output from previous day:  Intake/Output Summary (Last 24 hours) at 10/02/2019 0959 Last data filed at 10/02/2019 0600 Gross per 24 hour  Intake 1000 ml  Output 310 ml  Net 690 ml    Intake/Output this shift: No intake/output data recorded.  Labs: Recent Labs    10/01/19 0702 10/02/19 0410  HGB 9.9* 9.5*   Recent Labs    10/01/19 0702 10/02/19 0410  WBC 6.3 7.6  RBC 3.27* 3.18*  HCT 31.5* 30.9*  PLT 215 197   Recent Labs    10/01/19 0702 10/02/19 0410  NA 142 140  K 3.3* 4.4  CL 104 108  CO2 25 23  BUN 23 23  CREATININE 1.56* 1.32*  GLUCOSE 96 148*  CALCIUM 8.4* 8.0*   Recent Labs    10/01/19 0702  INR 1.4*     EXAM General - Patient is Alert, Appropriate and Oriented Extremity - ABD soft  Intact to light touch to the dorsal and plantar aspect of the toes. Cap refill intact to all digits. Able to flex and extend toes without pain Dressing/Incision - Short leg splint intact to the right lower extremity. Reports no issues with the splint. Motor Function - intact, moving foot and toes well on exam.   Past Medical History:  Diagnosis Date   Anxiety    Arthritis    "knees" (09/26/2017)   Chronic diastolic CHF (congestive heart failure) (HCC)    a. Dx 08/2013 in setting of rapid afib;  b. 08/2013 Echo: EF 55-60%,  mildly dil LA, nl RV.   Drug-induced torsades de pointes 10/06/2017   GERD (gastroesophageal reflux disease)    High cholesterol    History of blood transfusion 1975   "related to ectopic pregnancy   Hypertension    Non-obstructive CAD    a. 10/2013 Myoview: anteroseptal, apical, septal mild ischemia, nl LV fxn;  b. Cath: LAD 66m LCX 20ost, RCA 22m.   Obesity    OSA on CPAP    Paroxysmal atrial fibrillation (HCC)    a. Dx 08/2013. CHA2DS2VASc = 3 (diast chf, htn, female) -->Xarelto initiated; b. 09/2013 s/p DCCV;  c. 10/2013 recurrent afib->Flecainide added->S/P DCCV;  d. 11/2013 Recurrent afib while off of flecainide (2/2 abnl nuc study)-->Flecainide resumed after nl cath-->DCCV;  e. Recurrent Afib 01/2014-->Flecainide increased to 150mg  BID.   Pneumonia    "1-2 times" (09/26/2017)    Assessment/Plan: 1 Day Post-Op Procedure(s) (LRB): OPEN REDUCTION INTERNAL FIXATION (ORIF) ANKLE FRACTURE (Right) Principal Problem:   Closed fracture of right distal fibula Active Problems:   Chronic diastolic CHF (congestive heart failure) (HCC)   GERD (gastroesophageal reflux disease)   Atrial fibrillation (HCC)   Coronary artery disease   Sleep apnea   Essential hypertension   Hypothyroidism   Normocytic anemia   CKD (chronic kidney disease), stage IIIa   Morbid obesity with BMI of 40.0-44.9,  adult [facility])   Preop cardiovascular exam  Estimated body mass index is 42.34 kg/m as calculated from the following:   Height as of this encounter: 5\' 3"  (1.6 m).   Weight as of this encounter: 108.4 kg. Advance diet Up with therapy   Labs reviewed this morning. Hg 9.5 this morning. Up with therapy, patient with little support at home, does have stairs.  May need SNF upon discharge. Patient has been undergoing gel injections, will plan to do injections for the patient while she is admitted.  DVT Prophylaxis - Eliquis Non-weightbearing to the right leg.  Raquel Jazilyn Siegenthaler, PA-C [facility]  Orthopaedic Surgery 10/02/2019, 9:59 AM

## 2019-10-02 NOTE — Procedures (Addendum)
-  primary osteoarthritis of the bilateral knees The patient would benefit from a visco supplementation injections  After discussion of the risks and benefits of visco supplementation injection, the patient expressed understanding of the risks and benefits and agreed to proceed.  The patient`s left knee was cleaned and prepped with ChloroPrep.  The skin was anesthetized with ethyl chloride, 3 mL of Lidocaine was injected with a 22 gauge needle for anesthesia of the tissues, and 2 mL of Orthovisc was injected into the left knee joint via inferolateral approach.  A Bandaid was then applied to the injection site. The patient tolerated the injection well.    The patient`s right knee was cleaned and prepped with ChloroPrep.  The skin was anesthetized with ethyl chloride, 3 mL of Lidocaine was injected with a 22 gauge needle for anesthesia of the tissues, and 2 mL of Orthovisc was injected into the right knee joint via inferolateral approach.  A Mepilex dressing was then applied to the injection site and site of a tibial abrasion. The patient tolerated the injection well.

## 2019-10-02 NOTE — Evaluation (Signed)
Physical Therapy Evaluation Patient Details Name: Stacey [name] MRN: [id number] DOB: 24-Apr-1951 Today's Date: 10/02/2019   History of Present Illness  69 y/o female s/p fall at home with R fibular fracture; ORIF 6/7.  Clinical Impression  Pt was willing to participate with PT and motivated to do what she could.  She was able to do some bed mobility and with great effort get to standing, however she was very functionally limited with ability to do anything involving dynamic standing tasks, unweighting; in short she did not have the UE strength/confidence to be able to take even a small hopping step while maintaining NWBing on the R.  Pt will not able to safely return home when she is ready for d/c from the hospital - she will require short term rehab to work back to a more functional and safe status.     Follow Up Recommendations SNF    Equipment Recommendations  Rolling walker with 5" wheels;3in1 (PT)(TBD at rehab facility)    Recommendations for Other Services       Precautions / Restrictions Precautions Precautions: None Restrictions RLE Weight Bearing: Non weight bearing      Mobility  Bed Mobility Overal bed mobility: Modified Independent             General bed mobility comments: Pt was able to manage R LE in/out of bed, some extra time and light self assist from UEs  Transfers Overall transfer level: Needs assistance Equipment used: Rolling walker (2 wheeled) Transfers: Sit to/from Stand Sit to Stand: Min assist         General transfer comment: Heavy cuing and encouragement, pt placing heel on ground (despite cuing) but did not appear to shift much weight through it during transition.  She struggled but did a bulk of the lifting effort w/o assist  Ambulation/Gait             General Gait Details: Pt could not manage to [place] with UEs well enough to do any hopping/stepping or even turning in place to get to recliner.  She manage 2 very labored and unsure  heel-toe side shuffles but no steps or transfers to recliner.   Stairs            Wheelchair Mobility    Modified Rankin (Stroke Patients Only)       Balance Overall balance assessment: Needs assistance Sitting-balance support: No upper extremity supported Sitting balance-Leahy Scale: Good Sitting balance - Comments: Good/confident sitting EOB balanace, some R foot pain in gravity dependent position   Standing balance support: Bilateral upper extremity supported Standing balance-Leahy Scale: Poor Standing balance comment: Pt able to maintain static standing relatively well, but lacks the UE strength/confidence to do any dynamic tasks while maintaining NWBing                             Pertinent Vitals/Pain Pain Assessment: 0-10 Pain Score: 6  Pain Location: R leg    Home Living Family/patient expects to be discharged to:: Skilled nursing facility Living Arrangements: Spouse/significant other Available Help at Discharge: Family   Home Access: Stairs to enter Entrance Stairs-Rails: Can reach both Entrance Stairs-Number of Steps: 6 Home Layout: 1/2 bath on main level;Bed/bath upstairs;Two level Home Equipment: None      Prior Function Level of Independence: Independent         Comments: Pt with some chronic R>L knee OA pain but able to run errands, etc  Hand Dominance        Extremity/Trunk Assessment   Upper Extremity Assessment Upper Extremity Assessment: Generalized weakness    Lower Extremity Assessment Lower Extremity Assessment: (limited R AROM again gravity, L grossly 4-/5)       Communication   Communication: No difficulties  Cognition Arousal/Alertness: Awake/alert Behavior During Therapy: WFL for tasks assessed/performed Overall Cognitive Status: Within Functional Limits for tasks assessed                                        General Comments      Exercises     Assessment/Plan    PT Assessment  Patient needs continued PT services  PT Problem List Decreased strength;Decreased range of motion;Decreased activity tolerance;Decreased balance;Decreased mobility;Decreased coordination;Decreased knowledge of use of DME;Decreased safety awareness;Pain       PT Treatment Interventions DME instruction;Gait training;Stair training;Functional mobility training;Therapeutic activities;Therapeutic exercise;Balance training;Neuromuscular re-education;Patient/family education    PT Goals (Current goals can be found in the Care Plan section)  Acute Rehab PT Goals Patient Stated Goal: get back to walking well PT Goal Formulation: With patient Time For Goal Achievement: 10/16/19 Potential to Achieve Goals: Good    Frequency BID   Barriers to discharge        Co-evaluation               AM-PAC PT "6 Clicks" Mobility  Outcome Measure Help needed turning from your back to your side while in a flat bed without using bedrails?: A Little Help needed moving from lying on your back to sitting on the side of a flat bed without using bedrails?: A Little Help needed moving to and from a bed to a chair (including a wheelchair)?: A Lot Help needed standing up from a chair using your arms (e.g., wheelchair or bedside chair)?: A Lot Help needed to walk in hospital room?: Total Help needed climbing 3-5 steps with a railing? : Total 6 Click Score: 12    End of Session Equipment Utilized During Treatment: Gait belt Activity Tolerance: Patient limited by pain;Patient limited by fatigue Patient left: with bed alarm set;with call bell/phone within reach Nurse Communication: Mobility status PT Visit Diagnosis: Muscle weakness (generalized) (M62.81);Difficulty in walking, not elsewhere classified (R26.2)    Time: 4196-2229 PT Time Calculation (min) (ACUTE ONLY): 41 min   Charges:   PT Evaluation $PT Eval Low Complexity: 1 Low PT Treatments $Therapeutic Activity: 8-22 mins        Kreg Shropshire, DPT 10/02/2019, 9:19 AM

## 2019-10-02 NOTE — NC FL2 (Signed)
[place] LEVEL OF CARE SCREENING TOOL     IDENTIFICATION  Patient Name: Stacey [name] Birthdate: October 02, 1950 Sex: female Admission Date (Current Location): 10/01/2019  [place] and [place] Number:  Engineering geologist and Address:  [facility], [address] [place], [place] [address]      Provider Number: [id number]  Attending Physician Name and Address:  Enzo Bi, MD  Relative Name and Phone Number:  Jeremy Johann [phone]    Current Level of Care: Hospital Recommended Level of Care: [place] Prior Approval Number:    Date Approved/Denied:   PASRR Number: [id number]  Discharge Plan: SNF    Current Diagnoses: Patient Active Problem List   Diagnosis Date Noted  . Hypothyroidism 10/01/2019  . Normocytic anemia 10/01/2019  . CKD (chronic kidney disease), stage IIIa 10/01/2019  . Closed fracture of right distal fibula 10/01/2019  . Morbid obesity with BMI of 40.0-44.9, adult ([place]) 10/01/2019  . Preop cardiovascular exam   . Drug-induced torsades de pointes 10/06/2017  . Persistent atrial fibrillation ([place]) 09/26/2017  . Essential hypertension 09/16/2014  . Sleep apnea 12/20/2013  . Coronary artery disease   . Chronic diastolic CHF (congestive heart failure) ([place])   . Obesity   . GERD (gastroesophageal reflux disease)   . Hypertensive cardiovascular disease   . Depression   . Presumed Obstructive Sleep Apnea   . Atrial fibrillation (HCC)     Orientation RESPIRATION BLADDER Height & Weight     Self, Time, Situation, Place  Normal Continent Weight: 108.4 kg Height:  5\' 3"  (160 cm)  BEHAVIORAL SYMPTOMS/MOOD NEUROLOGICAL BOWEL NUTRITION STATUS      Continent Diet(heart health carb modified)  AMBULATORY STATUS COMMUNICATION OF NEEDS Skin   Extensive Assist Verbally Surgical wounds                       Personal Care Assistance Level of Assistance  Bathing, Dressing Bathing Assistance: Limited  assistance   Dressing Assistance: Limited assistance     Functional Limitations Info             SPECIAL CARE FACTORS FREQUENCY  PT (By licensed PT), OT (By licensed OT)     PT Frequency: 5 times per week OT Frequency: 3 times per week            Contractures Contractures Info: Not present    Additional Factors Info                  Current Medications (10/02/2019):  This is the current hospital active medication list Current Facility-Administered Medications  Medication Dose Route Frequency Provider Last Rate Last Admin  . acetaminophen (TYLENOL) tablet 1,000 mg  1,000 mg Oral Q6H Poggi, Marshall Cork, MD   1,000 mg at 10/02/19 0520  . acetaminophen (TYLENOL) tablet 325-650 mg  325-650 mg Oral Q6H PRN Poggi, Marshall Cork, MD      . albuterol (VENTOLIN HFA) 108 (90 Base) MCG/ACT inhaler 2 puff  2 puff Inhalation Q4H PRN Poggi, Marshall Cork, MD      . amiodarone (PACERONE) tablet 200 mg  200 mg Oral Daily Kathlyn Sacramento A, MD   200 mg at 10/01/19 2051  . apixaban (ELIQUIS) tablet 5 mg  5 mg Oral BID Ivor Costa, MD   5 mg at 10/02/19 0850  . bisacodyl (DULCOLAX) suppository 10 mg  10 mg Rectal Daily PRN Poggi, Marshall Cork, MD      . carvedilol (COREG) tablet 12.5  mg  12.5 mg Oral BID WC Lorretta Harp, MD   12.5 mg at 10/02/19 0850  . docusate sodium (COLACE) capsule 100 mg  100 mg Oral BID Poggi, Excell Seltzer, MD   100 mg at 10/01/19 2200  . hydrALAZINE (APRESOLINE) injection 5 mg  5 mg Intravenous Q2H PRN Poggi, Excell Seltzer, MD      . levothyroxine (SYNTHROID) tablet 100 mcg  100 mcg Oral Daily Lorretta Harp, MD   100 mcg at 10/02/19 0814  . magnesium hydroxide (MILK OF MAGNESIA) suspension 30 mL  30 mL Oral Daily PRN Poggi, Excell Seltzer, MD      . methocarbamol (ROBAXIN) tablet 500 mg  500 mg Oral Q8H PRN Poggi, Excell Seltzer, MD      . morphine 2 MG/ML injection 2-4 mg  2-4 mg Intravenous Q3H PRN Poggi, Excell Seltzer, MD   2 mg at 10/01/19 2119  . ondansetron (ZOFRAN) tablet 4 mg  4 mg Oral Q6H PRN Poggi, Excell Seltzer, MD       Or   . ondansetron (ZOFRAN) injection 4 mg  4 mg Intravenous Q6H PRN Poggi, Excell Seltzer, MD      . oxyCODONE (Oxy IR/ROXICODONE) immediate release tablet 5-10 mg  5-10 mg Oral Q4H PRN Poggi, Excell Seltzer, MD   10 mg at 10/02/19 1033  . pantoprazole (PROTONIX) EC tablet 40 mg  40 mg Oral Daily Lorretta Harp, MD   40 mg at 10/02/19 0850  . rosuvastatin (CRESTOR) tablet 10 mg  10 mg Oral q1800 Lorretta Harp, MD      . senna-docusate (Senokot-S) tablet 1 tablet  1 tablet Oral QHS PRN Poggi, Excell Seltzer, MD      . sodium phosphate (FLEET) 7-19 GM/118ML enema 1 enema  1 enema Rectal Once PRN Poggi, Excell Seltzer, MD      . venlafaxine [facility]) tablet 75 mg  75 mg Oral BID Lorretta Harp, MD      . zolpidem (AMBIEN) tablet 5 mg  5 mg Oral QHS Lorretta Harp, MD      . zolpidem (AMBIEN) tablet 5 mg  5 mg Oral QHS PRN Manuela Schwartz, NP   5 mg at 10/02/19 0024     Discharge Medications: Please see discharge summary for a list of discharge medications.  Relevant Imaging Results:  Relevant Lab Results:   Additional Information SS# [id number]  Barrie Dunker, RN

## 2019-10-02 NOTE — Evaluation (Signed)
Occupational Therapy Evaluation Patient Details Name: Stacey [name] MRN: [id number] DOB: Jul 14, 1950 Today's Date: 10/02/2019    History of Present Illness 69 y/o female s/p fall at home with R fibular fracture; ORIF 6/7. PMH includes: HTN, HLD, and GERD.   Clinical Impression   Pt was seen for OT evaluation this date. Prior to hospital admission, pt was Indep with all self care ADLs and ADL mobility with no AD. Pt lives in two story home with BR and full BA on second floor, but has a bonus room and 1/2 bath on first floor she can use after rehab. Currently pt demonstrates impairments as described below (See OT problem list) which functionally limit her ability to perform ADL/self-care tasks. Pt currently requires MOD A with LB ADLs, MIN A with ADL transfers and is unable to hop with use of RW for fxl mobility.  Pt would benefit from skilled OT to address noted impairments and functional limitations (see below for any additional details) in order to maximize safety and independence while minimizing falls risk and caregiver burden. Upon hospital discharge, recommend STR to maximize pt safety and return to PLOF.     Follow Up Recommendations  SNF    Equipment Recommendations  3 in 1 bedside commode;Tub/shower seat    Recommendations for Other Services       Precautions / Restrictions Precautions Precautions: None Restrictions Weight Bearing Restrictions: Yes RLE Weight Bearing: Non weight bearing      Mobility Bed Mobility Overal bed mobility: Modified Independent             General bed mobility comments: extended time, but able to manage R LE in/OOB  Transfers Overall transfer level: Needs assistance               General transfer comment: NT on OT assessment as pt with increased pain and requesting oxycodone, Per PT note, MIN A on sit<>stand with RW    Balance Overall balance assessment: Needs assistance Sitting-balance support: No upper extremity  supported Sitting balance-Leahy Scale: Good         Standing balance comment: NT                           ADL either performed or assessed with clinical judgement   ADL Overall ADL's : Needs assistance/impaired Eating/Feeding: Independent   Grooming: Wash/dry hands;Wash/dry face;Oral care;Independent   Upper Body Bathing: Set up;Sitting   Lower Body Bathing: Moderate assistance;Sitting/lateral leans   Upper Body Dressing : Set up;Sitting   Lower Body Dressing: Moderate assistance;Sit to/from stand   Toilet Transfer: Minimal assistance;Stand-pivot;BSC           Functional mobility during ADLs: (NT< unsuccessful hopping wiht PT on assessment this date.)       Vision Patient Visual Report: No change from baseline       Perception     Praxis      Pertinent Vitals/Pain Pain Assessment: 0-10 Pain Score: 8  Pain Location: R leg Pain Descriptors / Indicators: Grimacing;Guarding;Aching Pain Intervention(s): Limited activity within patient's tolerance;Monitored during session;Patient requesting pain meds-RN notified     Hand Dominance     Extremity/Trunk Assessment Upper Extremity Assessment Upper Extremity Assessment: Generalized weakness   Lower Extremity Assessment Lower Extremity Assessment: Defer to PT evaluation;RLE deficits/detail RLE: Unable to fully assess due to pain;Unable to fully assess due to immobilization(ankle splinted)       Communication Communication Communication: No difficulties   Cognition Arousal/Alertness: Awake/alert Behavior  During Therapy: WFL for tasks assessed/performed Overall Cognitive Status: Within Functional Limits for tasks assessed                                     General Comments       Exercises Other Exercises Other Exercises: OT facilitates pt participation in 1 set x10 reps modified chair/tricep push ups to increase strength for bearing weight through UEs with RW to increase ability  to tolerate fxl mobility w/o being able to bear wt to R LE. Pt with good understanding and req's only min cues for technique and pace.   Shoulder Instructions      Home Living Family/patient expects to be discharged to:: Skilled nursing facility Living Arrangements: Spouse/significant other Available Help at Discharge: Family   Home Access: Stairs to enter Entrance Stairs-Number of Steps: 6 Entrance Stairs-Rails: Can reach both Home Layout: 1/2 bath on main level;Bed/bath upstairs;Two level               Home Equipment: None          Prior Functioning/Environment Level of Independence: Independent        Comments: Pt with some chronic R>L knee OA pain but able to run errands, etc        OT Problem List: Decreased strength;Decreased activity tolerance;Impaired balance (sitting and/or standing);Obesity;Pain      OT Treatment/Interventions: Self-care/ADL training;Therapeutic exercise;Energy conservation;DME and/or AE instruction;Therapeutic activities;Balance training;Patient/family education    OT Goals(Current goals can be found in the care plan section) Acute Rehab OT Goals Patient Stated Goal: get back to walking well OT Goal Formulation: With patient Time For Goal Achievement: 10/16/19 Potential to Achieve Goals: Good  OT Frequency: Min 2X/week   Barriers to D/C:            Co-evaluation              AM-PAC OT "6 Clicks" Daily Activity     Outcome Measure Help from another person eating meals?: None Help from another person taking care of personal grooming?: None Help from another person toileting, which includes using toliet, bedpan, or urinal?: A Lot Help from another person bathing (including washing, rinsing, drying)?: A Lot Help from another person to put on and taking off regular upper body clothing?: A Little Help from another person to put on and taking off regular lower body clothing?: A Lot 6 Click Score: 17   End of Session Equipment  Utilized During Treatment: Gait belt;Rolling walker Nurse Communication: Mobility status  Activity Tolerance: Patient tolerated treatment well Patient left: in bed;with call bell/phone within reach  OT Visit Diagnosis: Unsteadiness on feet (R26.81);Muscle weakness (generalized) (M62.81);History of falling (Z91.81)                Time: 5462-7035 OT Time Calculation (min): 38 min Charges:  OT General Charges $OT Visit: 1 Visit OT Evaluation $OT Eval Moderate Complexity: 1 Mod OT Treatments $Self Care/Home Management : 8-22 mins $Therapeutic Activity: 8-22 mins  Rejeana Brock, MS, OTR/L ascom [phone] 10/02/19, 4:46 PM

## 2019-10-02 NOTE — Progress Notes (Signed)
PROGRESS NOTE    Stacey [name]  WCB:[id number] DOB: June 24, 1950 DOA: 10/01/2019 PCP: Idelle Crouch, MD    Assessment & Plan:   Principal Problem:   Closed fracture of right distal fibula Active Problems:   Chronic diastolic CHF (congestive heart failure) (HCC)   GERD (gastroesophageal reflux disease)   Atrial fibrillation (HCC)   Coronary artery disease   Sleep apnea   Essential hypertension   Hypothyroidism   Normocytic anemia   CKD (chronic kidney disease), stage IIIa   Morbid obesity with BMI of 40.0-44.9, adult ([place])   Preop cardiovascular exam    Stacey [name] is a 69 y.o. Caucasian female with medical history significant of hypertension, hyperlipidemia, GERD, hypothyroidism, anxiety, PAF on Eliquis, OSA on CPAP, CAD, drug-induced torsade de pointes, morbid obesity, dCHF, who presented with fall and right ankle pain.   Closed fracture of right distal fibula s/p ORIF on 10/01/19 As evidenced by x-ray. No neurovascular compromise. Orthopedic surgeon, Dr. Roland Rack was consulted.  Given her significant cardiac history, cardiology, Dr. Fletcher Anon was also consulted for presurgical clearance. PLAN: - Pain control: morphine prn and percocet - When necessary Zofran for nausea - Robaxin for muscle spasm - PT/OT when able to  AKI --Cr 1.56 on presentation.  Baseline ~1. --d/c MIVF and encourage PO hydration --Hold home Lasix  Chronic diastolic CHF (congestive heart failure) ([place]): 2D echo on 12/06/2017 showed EF of 50-50% with grade 1 diastolic dysfunction.  Patient has trace leg edema, but no shortness of breath.  CHF is compensated. -Hold home dose Lasix  GERD (gastroesophageal reflux disease) -protonix  Atrial fibrillation (HCC) on Eliquis -resume Eliquis  -continue amiodarone and Coreg  Coronary artery disease: no CP -on crestor   Sleep apnea -CPAP  HTN:  -Continue home medications: Coreg --Hold home lasix -hydralazine prn  Hypothyroidism: TSH 1.482 on  07/11/2019 -Synthroid  Normocytic anemia: hgb 10.5 on 07/11/2019 --> 9.9  -Follow-up with CBC  Hypokalemia and hypomag --replete PRN   Morbid obesity with BMI of 40.0-44.9, adult ([place]) -Diet and exercise.  -Encouraged to lose weight.    DVT prophylaxis: OH:YWVPXTG Code Status: Full code  Family Communication:  Status is: inpatient Dispo:   The patient is from: home Anticipated d/c is to: SNF rehab Anticipated d/c date is: when bed available Patient currently is medically stable to d/c.   Subjective and Interval History:  Pt reported ankle pain controlled with pain meds.  Complained of mild sore throat.  No fever, dyspnea, chest pain, abdominal pain, N/V/D, dysuria.  No BM yet.   Objective: Vitals:   10/01/19 2338 10/02/19 0736 10/02/19 1144 10/02/19 1628  BP: (!) 114/50 (!) 131/55 (!) 122/48 (!) 96/41  Pulse: 64 (!) 58 60 (!) 57  Resp: 20 16 18 18   Temp: 98.6 F (37 C) 98.8 F (37.1 C) 98.3 F (36.8 C) 98.3 F (36.8 C)  TempSrc: Oral     SpO2: 100% 99% 95% 98%  Weight:      Height:        Intake/Output Summary (Last 24 hours) at 10/02/2019 2140 Last data filed at 10/02/2019 1830 Gross per 24 hour  Intake 1150 ml  Output --  Net 1150 ml   Filed Weights   10/01/19 0303  Weight: 108.4 kg    Examination:   Constitutional: NAD, AAOx3 HEENT: conjunctivae and lids normal, EOMI CV: RRR no M,R,G. Distal pulses +2.  No cyanosis.   RESP: CTA B/L, normal respiratory effort  GI: +BS, NTND Extremities: No effusions, edema,  or tenderness LLE.  Right foot in ACE wrap. SKIN: warm, dry and intact Neuro: II - XII grossly intact.  Sensation intact Psych: Normal mood and affect.  Appropriate judgement and reason   Data Reviewed: I have personally reviewed following labs and imaging studies  CBC: Recent Labs  Lab 10/01/19 0702 10/02/19 0410  WBC 6.3 7.6  NEUTROABS 4.4  --   HGB 9.9* 9.5*  HCT 31.5* 30.9*  MCV 96.3 97.2  PLT 215 197   Basic Metabolic  Panel: Recent Labs  Lab 10/01/19 0702 10/02/19 0410  NA 142 140  K 3.3* 4.4  CL 104 108  CO2 25 23  GLUCOSE 96 148*  BUN 23 23  CREATININE 1.56* 1.32*  CALCIUM 8.4* 8.0*  MG 1.6*  --    GFR: Estimated Creatinine Clearance: 48.8 mL/min (A) (by C-G formula based on SCr of 1.32 mg/dL (H)). Liver Function Tests: No results for input(s): AST, ALT, ALKPHOS, BILITOT, PROT, ALBUMIN in the last 168 hours. No results for input(s): LIPASE, AMYLASE in the last 168 hours. No results for input(s): AMMONIA in the last 168 hours. Coagulation Profile: Recent Labs  Lab 10/01/19 0702  INR 1.4*   Cardiac Enzymes: No results for input(s): CKTOTAL, CKMB, CKMBINDEX, TROPONINI in the last 168 hours. BNP (last 3 results) No results for input(s): PROBNP in the last 8760 hours. HbA1C: No results for input(s): HGBA1C in the last 72 hours. CBG: No results for input(s): GLUCAP in the last 168 hours. Lipid Profile: No results for input(s): CHOL, HDL, LDLCALC, TRIG, CHOLHDL, LDLDIRECT in the last 72 hours. Thyroid Function Tests: No results for input(s): TSH, T4TOTAL, FREET4, T3FREE, THYROIDAB in the last 72 hours. Anemia Panel: No results for input(s): VITAMINB12, FOLATE, FERRITIN, TIBC, IRON, RETICCTPCT in the last 72 hours. Sepsis Labs: No results for input(s): PROCALCITON, LATICACIDVEN in the last 168 hours.  Recent Results (from the past 240 hour(s))  SARS Coronavirus 2 by RT PCR (hospital order, performed in [facility] hospital lab) Nasopharyngeal Nasopharyngeal Swab     Status: None   Collection Time: 10/01/19  7:02 AM   Specimen: Nasopharyngeal Swab  Result Value Ref Range Status   SARS Coronavirus 2 NEGATIVE NEGATIVE Final    Comment: (NOTE) SARS-CoV-2 target nucleic acids are NOT DETECTED. The SARS-CoV-2 RNA is generally detectable in upper and lower respiratory specimens during the acute phase of infection. The lowest concentration of SARS-CoV-2 viral copies this assay can detect is  250 copies / mL. A negative result does not preclude SARS-CoV-2 infection and should not be used as the sole basis for treatment or other patient management decisions.  A negative result may occur with improper specimen collection / handling, submission of specimen other than nasopharyngeal swab, presence of viral mutation(s) within the areas targeted by this assay, and inadequate number of viral copies (<250 copies / mL). A negative result must be combined with clinical observations, patient history, and epidemiological information. Fact Sheet for Patients:   BoilerBrush.com.cy Fact Sheet for Healthcare Providers: https://pope.com/ This test is not yet approved or cleared  by the [place] FDA and has been authorized for detection and/or diagnosis of SARS-CoV-2 by FDA under an Emergency Use Authorization (EUA).  This EUA will remain in effect (meaning this test can be used) for the duration of the COVID-19 declaration under Section 564(b)(1) of the Act, 21 U.S.C. section 360bbb-3(b)(1), unless the authorization is terminated or revoked sooner. Performed at [facility], [address] [place], [place] [address]  Radiology Studies: DG Ankle 2 Views Right  Result Date: 10/01/2019 CLINICAL DATA:  Known right fibular fracture EXAM: RIGHT ANKLE - 2 VIEW; DG C-ARM 1-60 MIN COMPARISON:  Films from earlier in the same day. FLUOROSCOPY TIME:  Fluoroscopy Time:  13 seconds Radiation Exposure Index (if provided by the fluoroscopic device): Not available Number of Acquired Spot Images: 4 FINDINGS: Fixation sideplate is now seen along the distal fibula with multiple fixation screws. Fracture fragments are in anatomic alignment. Reduction of the talar displacement is noted. IMPRESSION: ORIF of right distal fibular fracture Electronically Signed   By: Alcide Clever M.D.   On: 10/01/2019 13:26   DG Ankle 2 Views Right  Result Date:  10/01/2019 CLINICAL DATA:  Post reduction right ankle fracture. EXAM: RIGHT ANKLE - 2 VIEW COMPARISON:  10/01/2019 at 5 a.m. FINDINGS: Alignment is improved from the earlier exam, most evident along lateral view, where the posterior subluxation of the talus in relation to distal tibia has been reduced. Lateral talar subluxation as and mildly improved, with 4-5 mm of lateral subluxation persisting. There is 4 mm of lateral 6 mm of posterior displacement the distal fibular fracture. IMPRESSION: 1. Improved alignment of the ankle joint distal fibular fracture following closed reduction. Electronically Signed   By: Amie Portland M.D.   On: 10/01/2019 08:14   DG Ankle 2 Views Right  Result Date: 10/01/2019 CLINICAL DATA:  Right ankle fracture reduction EXAM: RIGHT ANKLE - 2 VIEW COMPARISON:  Earlier today FINDINGS: Distal diaphysis fracture of the fibula and medial soft tissue injury with lateral and posterior malalignment at the ankle. Posterior displacement with marked anterior joint space widening is worsened from prior study. A splint has been placed. IMPRESSION: Worsening alignment with interval marked posterior displacement of the ankle. Electronically Signed   By: Marnee Spring M.D.   On: 10/01/2019 05:48   DG Ankle Complete Right  Result Date: 10/01/2019 CLINICAL DATA:  Right ankle pain, swelling, and ecchymosis after fall. EXAM: RIGHT ANKLE - COMPLETE 3+ VIEW COMPARISON:  None. FINDINGS: Displaced distal fibular fracture at and just above the level of the ankle mortise. There is disruption of the ankle mortise with widening of the medial clear space and probable avulsion injury from the medial malleolus. No definite posterior tibial tubercle fracture. There is generalized soft tissue edema. Ankle joint effusion. Plantar calcaneal spur. IMPRESSION: Displaced distal fibular fracture with probable avulsion injury from the medial malleolus. Disruption of the ankle mortise with widening of the medial clear  space. No definite posterior malleolar fracture. Electronically Signed   By: Narda Rutherford M.D.   On: 10/01/2019 03:32   DG Chest Portable 1 View  Result Date: 10/01/2019 CLINICAL DATA:  Right ankle fracture EXAM: PORTABLE CHEST 1 VIEW COMPARISON:  11/15/2013 FINDINGS: Cardiac silhouette is normal in size. No mediastinal or hilar masses. Clear lungs. No pleural effusion or pneumothorax. Skeletal structures are grossly intact IMPRESSION: No acute cardiopulmonary disease. Electronically Signed   By: Amie Portland M.D.   On: 10/01/2019 08:15   DG C-Arm 1-60 Min  Result Date: 10/01/2019 CLINICAL DATA:  Known right fibular fracture EXAM: RIGHT ANKLE - 2 VIEW; DG C-ARM 1-60 MIN COMPARISON:  Films from earlier in the same day. FLUOROSCOPY TIME:  Fluoroscopy Time:  13 seconds Radiation Exposure Index (if provided by the fluoroscopic device): Not available Number of Acquired Spot Images: 4 FINDINGS: Fixation sideplate is now seen along the distal fibula with multiple fixation screws. Fracture fragments are in anatomic alignment. Reduction of  the talar displacement is noted. IMPRESSION: ORIF of right distal fibular fracture Electronically Signed   By: Alcide Clever M.D.   On: 10/01/2019 13:26     Scheduled Meds:  amiodarone  200 mg Oral Daily   apixaban  5 mg Oral BID   carvedilol  12.5 mg Oral BID WC   docusate sodium  100 mg Oral BID   levothyroxine  100 mcg Oral Daily   pantoprazole  40 mg Oral Daily   rosuvastatin  10 mg Oral q1800   venlafaxine  75 mg Oral BID   Continuous Infusions:   LOS: 1 day     Darlin Priestly, MD [facility] If 7PM-7AM, please contact night-coverage 10/02/2019, 9:40 PM

## 2019-10-03 DIAGNOSIS — I480 Paroxysmal atrial fibrillation: Secondary | ICD-10-CM

## 2019-10-03 LAB — CBC
HCT: 28.7 % — ABNORMAL LOW (ref 36.0–46.0)
Hemoglobin: 8.8 g/dL — ABNORMAL LOW (ref 12.0–15.0)
MCH: 29.6 pg (ref 26.0–34.0)
MCHC: 30.7 g/dL (ref 30.0–36.0)
MCV: 96.6 fL (ref 80.0–100.0)
Platelets: 170 10*3/uL (ref 150–400)
RBC: 2.97 MIL/uL — ABNORMAL LOW (ref 3.87–5.11)
RDW: 16.9 % — ABNORMAL HIGH (ref 11.5–15.5)
WBC: 6 10*3/uL (ref 4.0–10.5)
nRBC: 0 % (ref 0.0–0.2)

## 2019-10-03 LAB — BASIC METABOLIC PANEL
Anion gap: 8 (ref 5–15)
BUN: 26 mg/dL — ABNORMAL HIGH (ref 8–23)
CO2: 25 mmol/L (ref 22–32)
Calcium: 8.3 mg/dL — ABNORMAL LOW (ref 8.9–10.3)
Chloride: 108 mmol/L (ref 98–111)
Creatinine, Ser: 1.46 mg/dL — ABNORMAL HIGH (ref 0.44–1.00)
GFR calc Af Amer: 43 mL/min — ABNORMAL LOW (ref >60)
GFR calc non Af Amer: 37 mL/min — ABNORMAL LOW (ref >60)
Glucose, Bld: 108 mg/dL — ABNORMAL HIGH (ref 70–99)
Potassium: 4.2 mmol/L (ref 3.5–5.1)
Sodium: 141 mmol/L (ref 135–145)

## 2019-10-03 LAB — SARS CORONAVIRUS 2 BY RT PCR (HOSPITAL ORDER, PERFORMED IN ~~LOC~~ HOSPITAL LAB): SARS Coronavirus 2: NEGATIVE

## 2019-10-03 LAB — MAGNESIUM: Magnesium: 2.1 mg/dL (ref 1.7–2.4)

## 2019-10-03 MED ORDER — CARVEDILOL 3.125 MG PO TABS
6.2500 mg | ORAL_TABLET | Freq: Two times a day (BID) | ORAL | Status: DC
  Administered 2019-10-03 (×2): 6.25 mg via ORAL
  Filled 2019-10-03 (×2): qty 2

## 2019-10-03 NOTE — Progress Notes (Signed)
Subjective: 2 Days Post-Op Procedure(s) (LRB): OPEN REDUCTION INTERNAL FIXATION (ORIF) ANKLE FRACTURE (Right) Patient reports pain as mild.   Patient is well, and has had no acute complaints or problems Plan is to go Skilled nursing facility after hospital stay. Negative for chest pain and shortness of breath Fever: no Gastrointestinal:Negative for nausea and vomiting Underwent gel injections yesterday for bilateral knees, will need third injection next week.  Objective: Vital signs in last 24 hours: Temp:  [97.8 F (36.6 C)-98.3 F (36.8 C)] 98.3 F (36.8 C) (06/09 0725) Pulse Rate:  [56-60] 60 (06/09 0725) Resp:  [16-18] 17 (06/09 0725) BP: (96-125)/(41-59) 125/59 (06/09 0725) SpO2:  [95 %-100 %] 100 % (06/09 0725)  Intake/Output from previous day:  Intake/Output Summary (Last 24 hours) at 10/03/2019 0752 Last data filed at 10/02/2019 1830 Gross per 24 hour  Intake 750 ml  Output --  Net 750 ml    Intake/Output this shift: No intake/output data recorded.  Labs: Recent Labs    10/01/19 0702 10/02/19 0410 10/03/19 0518  HGB 9.9* 9.5* 8.8*   Recent Labs    10/02/19 0410 10/03/19 0518  WBC 7.6 6.0  RBC 3.18* 2.97*  HCT 30.9* 28.7*  PLT 197 170   Recent Labs    10/02/19 0410 10/03/19 0518  NA 140 141  K 4.4 4.2  CL 108 108  CO2 23 25  BUN 23 26*  CREATININE 1.32* 1.46*  GLUCOSE 148* 108*  CALCIUM 8.0* 8.3*   Recent Labs    10/01/19 0702  INR 1.4*     EXAM General - Patient is Alert, Appropriate and Oriented Extremity - ABD soft  Intact to light touch to the dorsal and plantar aspect of the toes. Cap refill intact to all digits. Able to flex and extend toes without pain Dressing/Incision - Short leg splint intact to the right lower extremity. Reports no issues with the splint. Motor Function - intact, moving foot and toes well on exam.   Past Medical History:  Diagnosis Date  . Anxiety   . Arthritis    "knees" (09/26/2017)  . Chronic  diastolic CHF (congestive heart failure) (HCC)    a. Dx 08/2013 in setting of rapid afib;  b. 08/2013 Echo: EF 55-60%, mildly dil LA, nl RV.  Marland Kitchen Drug-induced torsades de pointes 10/06/2017  . GERD (gastroesophageal reflux disease)   . High cholesterol   . History of blood transfusion 1975   "related to ectopic pregnancy  . Hypertension   . Non-obstructive CAD    a. 10/2013 Myoview: anteroseptal, apical, septal mild ischemia, nl LV fxn;  b. Cath: LAD 91m LCX 20ost, RCA 40m.  . Obesity   . OSA on CPAP   . Paroxysmal atrial fibrillation (HCC)    a. Dx 08/2013. CHA2DS2VASc = 3 (diast chf, htn, female) -->Xarelto initiated; b. 09/2013 s/p DCCV;  c. 10/2013 recurrent afib->Flecainide added->S/P DCCV;  d. 11/2013 Recurrent afib while off of flecainide (2/2 abnl nuc study)-->Flecainide resumed after nl cath-->DCCV;  e. Recurrent Afib 01/2014-->Flecainide increased to 150mg  BID.  Pneumonia    "1-2 times" (09/26/2017)    Assessment/Plan: 2 Days Post-Op Procedure(s) (LRB): OPEN REDUCTION INTERNAL FIXATION (ORIF) ANKLE FRACTURE (Right) Principal Problem:   Closed fracture of right distal fibula Active Problems:   Chronic diastolic CHF (congestive heart failure) (HCC)   GERD (gastroesophageal reflux disease)   Atrial fibrillation (HCC)   Coronary artery disease   Sleep apnea   Essential hypertension   Hypothyroidism   Normocytic anemia  CKD (chronic kidney disease), stage IIIa   Morbid obesity with BMI of 40.0-44.9, adult (HCC)   Preop cardiovascular exam  Estimated body mass index is 42.34 kg/m as calculated from the following:   Height as of this encounter: 5\' 3"  (1.6 m).   Weight as of this encounter: 108.4 kg. Advance diet Up with therapy   Labs reviewed this morning. Hg 8.8 this morning. Up with therapy, Plan is for d/c to SNF.  Will need to stay additional night, plan for d/c to SNF tomorrow. Will discharge home on Eliquis. Plan for follow-up with [facility] in 10-14 days for  staple removal and repeat x-rays.  DVT Prophylaxis - Eliquis Non-weightbearing to the right leg.  Raquel Hollye Pritt, PA-C [facility] Orthopaedic Surgery 10/03/2019, 7:52 AM

## 2019-10-03 NOTE — Progress Notes (Signed)
Physical Therapy Treatment Patient Details Name: Stacey [name] MRN: [id number] DOB: 1950-07-05 Today's Date: 10/03/2019    History of Present Illness 69 y/o female s/p fall at home with R fibular fracture; ORIF 6/7. PMH includes: HTN, HLD, and GERD.    PT Comments    Pt was long sitting in bed with RLE elevated upon arriving. She requested to use bedpan but therapist was able to convince her to trial stand pivot to [facility]. She reports 4/10 pain and has had pain meds prior to session. Se was able to exit L side of bed without physical assistance(HOB was elevated), stood to RW with min assist on first trial but was able to progress to CGA only. Pt able to maintain NWB RLE with standing activity by the end of session. Session progressed to static standing then to hopping 5 x on LLE. Pt did fatigue quickly and does reports " throbbing" in RLE after standing activity. Once returned to supine, pt performed there ex handout and tolerated well. Pt is progressing well towards PT goals. Will benefit from SNF at DC to address strength, balance, and safe functional mobility. She was in bed, supine, with call bell in reach, RLE elevated, ice pack applied, and RN aware of pt's abilities. Acute PT will continue to follow per POC.     Follow Up Recommendations  SNF     Equipment Recommendations  Rolling walker with 5" wheels;3in1 (PT)    Recommendations for Other Services       Precautions / Restrictions Precautions Precautions: Fall Restrictions Weight Bearing Restrictions: Yes RLE Weight Bearing: Non weight bearing    Mobility  Bed Mobility Overal bed mobility: Modified Independent             General bed mobility comments: HOB elevated. pt required no physical assistance or vcs to achieve EOB sitting or returning to supine at conclsuion of session  Transfers Overall transfer level: Needs assistance Equipment used: Rolling walker (2 wheeled) Transfers: Sit to/from Stand Sit to Stand: Min  guard;From elevated surface         General transfer comment: Pt was able to safely STS from elevated bed height with vcs for fwd wt shift and handplacement. therapist had pt rest RLE on therapist foot to adhere to proper NWB. Pt unable to perform standing without minimal wt bearing through RLE however on next 4 trials was able to perform and maintain NWB. Pt is progressing well  Ambulation/Gait Ambulation/Gait assistance: Min guard Gait Distance (Feet): 3 Feet Assistive device: Rolling walker (2 wheeled) Gait Pattern/deviations: (" hop to" "pivot ") Gait velocity: decreased   General Gait Details: pt was able to stand "hop" mostly pivot on LLE to/from BSC >EOB with vcs for technique and sequencing. she was able to adhere to proper NWB but unable to go further distances 2/2 to pain/fatigeu   Stairs             Wheelchair Mobility    Modified Rankin (Stroke Patients Only)       Balance Overall balance assessment: Needs assistance Sitting-balance support: No upper extremity supported Sitting balance-Leahy Scale: Good Sitting balance - Comments: Good/confident sitting EOB balanace, some R foot pain in gravity dependent position   Standing balance support: Bilateral upper extremity supported Standing balance-Leahy Scale: Fair Standing balance comment: improved static standing balance with BUE support on RW while maintaining proper NWB  Cognition Arousal/Alertness: Awake/alert Behavior During Therapy: WFL for tasks assessed/performed Overall Cognitive Status: Within Functional Limits for tasks assessed                                 General Comments: Pt is A and O x 4 and agreeable and motivated for PT session      Exercises General Exercises - Lower Extremity Ankle Circles/Pumps: Left;AROM;10 reps Quad Sets: Strengthening;10 reps Short Arc Quad: AROM;Both;10 reps Hip ABduction/ADduction: Strengthening;10  reps Straight Leg Raises: AROM;10 reps    General Comments        Pertinent Vitals/Pain Pain Assessment: 0-10 Pain Score: 4  Pain Location: R leg Pain Descriptors / Indicators: Grimacing;Guarding;Aching Pain Intervention(s): Limited activity within patient's tolerance;Monitored during session;Premedicated before session;Repositioned;Ice applied    Home Living                      Prior Function            PT Goals (current goals can now be found in the care plan section) Acute Rehab PT Goals Patient Stated Goal: get back to walking well Progress towards PT goals: Progressing toward goals    Frequency    BID      PT Plan Current plan remains appropriate    Co-evaluation              AM-PAC PT "6 Clicks" Mobility   Outcome Measure  Help needed turning from your back to your side while in a flat bed without using bedrails?: A Little Help needed moving from lying on your back to sitting on the side of a flat bed without using bedrails?: A Little Help needed moving to and from a bed to a chair (including a wheelchair)?: A Lot Help needed standing up from a chair using your arms (e.g., wheelchair or bedside chair)?: A Lot Help needed to walk in hospital room?: Total Help needed climbing 3-5 steps with a railing? : Total 6 Click Score: 12    End of Session Equipment Utilized During Treatment: Gait belt Activity Tolerance: Patient limited by pain;Patient limited by fatigue Patient left: with bed alarm set;with call bell/phone within reach;with family/visitor present Nurse Communication: Mobility status PT Visit Diagnosis: Muscle weakness (generalized) (M62.81);Difficulty in walking, not elsewhere classified (R26.2)     Time: 1521-1600 PT Time Calculation (min) (ACUTE ONLY): 39 min  Charges:  $Therapeutic Exercise: 8-22 mins $Therapeutic Activity: 23-37 mins                     Jetta Lout PTA 10/03/19, 4:55 PM

## 2019-10-03 NOTE — TOC Progression Note (Signed)
Transition of Care [facility]) - Progression Note    Patient Details  Name: Stacey [name] MRN: [id number] Date of Birth: 24-Feb-1951  Transition of Care [facility]) CM/SW Contact  Barrie Dunker, RN Phone Number: 10/03/2019, 10:12 AM  Clinical Narrative:     Spoke with the patient and reviewed the bed offers, she asked me to send a request to [facility] and [facility] in [place], I sent out the request and will review once obtaibed       Expected Discharge Plan and Services                                                 Social Determinants of Health (SDOH) Interventions    Readmission Risk Interventions No flowsheet data found.

## 2019-10-03 NOTE — Progress Notes (Signed)
Progress Note  Patient Name: Brizza Nathanson Date of Encounter: 10/03/2019  [facility] Cardiologist: Kathlyn Sacramento, MD   Subjective   Doing okay, tired from working with physical therapy.  Inpatient Medications    Scheduled Meds: . amiodarone  200 mg Oral Daily  . apixaban  5 mg Oral BID  . carvedilol  6.25 mg Oral BID WC  . docusate sodium  100 mg Oral BID  . levothyroxine  100 mcg Oral Daily  . pantoprazole  40 mg Oral Daily  . rosuvastatin  10 mg Oral q1800  . venlafaxine  75 mg Oral BID   Continuous Infusions:  PRN Meds: acetaminophen, albuterol, bisacodyl, hydrALAZINE, magnesium hydroxide, methocarbamol, morphine injection, ondansetron **OR** ondansetron (ZOFRAN) IV, oxyCODONE, senna-docusate, sodium phosphate, zolpidem   Vital Signs    Vitals:   10/02/19 1144 10/02/19 1628 10/02/19 2336 10/03/19 0725  BP: (!) 122/48 (!) 96/41 (!) 103/44 (!) 125/59  Pulse: 60 (!) 57 (!) 56 60  Resp: 18 18 16 17   Temp: 98.3 F (36.8 C) 98.3 F (36.8 C) 97.8 F (36.6 C) 98.3 F (36.8 C)  TempSrc:   Oral   SpO2: 95% 98% 98% 100%  Weight:      Height:        Intake/Output Summary (Last 24 hours) at 10/03/2019 1321 Last data filed at 10/03/2019 0935 Gross per 24 hour  Intake 990 ml  Output --  Net 990 ml   Last 3 Weights 10/01/2019 06/21/2019 12/19/2018  Weight (lbs) 239 lb 241 lb 12 oz 240 lb  Weight (kg) 108.41 kg 109.657 kg 108.863 kg      Telemetry    Patient currently off telemetry  ECG    No new tracing noted  Physical Exam   GEN: No acute distress.   Neck: No JVD Cardiac: RRR, no murmurs, rubs, or gallops.  Respiratory: Clear to auscultation bilaterally. GI: Soft, nontender, non-distended  MS:  Right ankle in dressing. Neuro:  Nonfocal  Psych: Normal affect   Labs    High Sensitivity Troponin:  No results for input(s): TROPONINIHS in the last 720 hours.    Chemistry Recent Labs  Lab 10/01/19 0702 10/02/19 0410 10/03/19 0518  NA 142 140 141   K 3.3* 4.4 4.2  CL 104 108 108  CO2 25 23 25   GLUCOSE 96 148* 108*  BUN 23 23 26*  CREATININE 1.56* 1.32* 1.46*  CALCIUM 8.4* 8.0* 8.3*  GFRNONAA 34* 42* 37*  GFRAA 39* 48* 43*  ANIONGAP 13 9 8      Hematology Recent Labs  Lab 10/01/19 0702 10/02/19 0410 10/03/19 0518  WBC 6.3 7.6 6.0  RBC 3.27* 3.18* 2.97*  HGB 9.9* 9.5* 8.8*  HCT 31.5* 30.9* 28.7*  MCV 96.3 97.2 96.6  MCH 30.3 29.9 29.6  MCHC 31.4 30.7 30.7  RDW 16.1* 16.2* 16.9*  PLT 215 197 170    BNP Recent Labs  Lab 10/01/19 0702  BNP 328.1*     DDimer No results for input(s): DDIMER in the last 168 hours.   Radiology    No results found.  Cardiac Studies   Echo 11/2017 EF 50 to 55%  Patient Profile     69 y.o. female with history of paroxysmal atrial fibrillation, OSA, hypertension, prolonged QT torsades, nonobstructive CAD presenting with fall and ankle fracture status post procedure, originally seen for preop eval.  Assessment & Plan    1.  Paroxysmal atrial fibrillation -Currently in sinus rhythm -Continue Eliquis, amiodarone, beta-blocker  2.  History of hypertension -Blood pressures have been low since procedure -Decrease Coreg to 6.25 twice daily -Holding Entresto for now due to low blood pressures.  Restart when blood pressure permits  3.  Ankle fracture -Status post ORIF -Management as per surgical team  4.  History of heart failure reduced ejection fraction -Last EF 50 to 55% -On Coreg 6.25 twice daily -Holding Entresto due to low blood pressures      Signed, Debbe Odea, MD  10/03/2019, 1:21 PM

## 2019-10-03 NOTE — TOC Progression Note (Signed)
Transition of Care [facility]) - Progression Note    Patient Details  Name: Stacey [name] MRN: [id number] Date of Birth: [birth year]  [facility]) CM/SW Contact  Barrie Dunker, RN Phone Number: 10/03/2019, 2:10 PM  Clinical Narrative:    Spoke with the patient and reviewed the bed offers, she has accepted the offer from [facility], I notified French Ana with [facility] of there bed acceptance        Expected Discharge Plan and Services                                                 Social Determinants of Health (SDOH) Interventions    Readmission Risk Interventions No flowsheet data found.

## 2019-10-03 NOTE — Progress Notes (Signed)
PT Cancellation Note  Patient Details Name: Stacey [name] MRN: [id number] DOB: 11-22-1950   Cancelled Treatment:     PT attempt. Pt just received suppository and requesting holding PT this AM. Therapist will return later this afternoon.     Rushie Chestnut 10/03/2019, 12:31 PM

## 2019-10-03 NOTE — Progress Notes (Signed)
PROGRESS NOTE    Stacey [name]  CXK:[id number] DOB: Sep 10, 1950 DOA: 10/01/2019 PCP: Idelle Crouch, MD    Assessment & Plan:   Principal Problem:   Closed fracture of right distal fibula Active Problems:   Chronic diastolic CHF (congestive heart failure) (HCC)   GERD (gastroesophageal reflux disease)   Atrial fibrillation (HCC)   Coronary artery disease   Sleep apnea   Essential hypertension   Hypothyroidism   Normocytic anemia   CKD (chronic kidney disease), stage IIIa   Morbid obesity with BMI of 40.0-44.9, adult ([place])   Preop cardiovascular exam    Stacey [name] is a 69 y.o. Caucasian female with medical history significant of hypertension, hyperlipidemia, GERD, hypothyroidism, anxiety, PAF on Eliquis, OSA on CPAP, CAD, drug-induced torsade de pointes, morbid obesity, dCHF, who presented with fall and right ankle pain.   Closed fracture of right distal fibula s/p ORIF on 10/01/19 As evidenced by x-ray. No neurovascular compromise. Orthopedic surgeon, Dr. Roland Rack was consulted.  Given her significant cardiac history, cardiology, Dr. Fletcher Anon was also consulted for presurgical clearance. PLAN: - Pain control: morphine prn and percocet - When necessary Zofran for nausea - Robaxin PRN for muscle spasm --Plan for follow-up with [place] in 10-14 days for staple removal and repeat x-rays. --Non-weightbearing to the right leg.  AKI --Cr 1.56 on presentation.  Baseline ~1. --d/c MIVF and encourage PO hydration --Hold home Lasix  Chronic diastolic CHF (congestive heart failure) ([place]): 2D echo on 12/06/2017 showed EF of 50-50% with grade 1 diastolic dysfunction.  Patient has trace leg edema, but no shortness of breath.  CHF is compensated. -Hold home dose Lasix -On Coreg 6.25 twice daily -Holding Entresto due to low blood pressures  GERD (gastroesophageal reflux disease) -protonix  Atrial fibrillation (HCC) on Eliquis -continue Eliquis  -continue amiodarone and  Coreg  Coronary artery disease: no CP -on crestor   Sleep apnea -CPAP  HTN:  -Continue home medications: Coreg decreased to 6.25 BID by cards -Holding Entresto for now due to low blood pressures.  Restart when blood pressure permits --Hold home lasix  Hypothyroidism: TSH 1.482 on 07/11/2019 -Synthroid  Normocytic anemia: hgb 10.5 on 07/11/2019 --> 9.9  -Follow-up with CBC  Hypokalemia and hypomag --replete PRN   Morbid obesity with BMI of 40.0-44.9, adult (HCC) -Diet and exercise.  -Encouraged to lose weight.    DVT prophylaxis: HF:WYOVZCH Code Status: Full code  Family Communication:  Status is: inpatient Dispo:   The patient is from: home Anticipated d/c is to: SNF rehab Anticipated d/c date is: when bed available Patient currently is medically stable to d/c.   Subjective and Interval History:  Pt reported pain controlled.  No fever, dyspnea, chest pain, abdominal pain, N/V/D.  Normal PO intake and having BM.   Objective: Vitals:   10/02/19 1144 10/02/19 1628 10/02/19 2336 10/03/19 0725  BP: (!) 122/48 (!) 96/41 (!) 103/44 (!) 125/59  Pulse: 60 (!) 57 (!) 56 60  Resp: 18 18 16 17   Temp: 98.3 F (36.8 C) 98.3 F (36.8 C) 97.8 F (36.6 C) 98.3 F (36.8 C)  TempSrc:   Oral   SpO2: 95% 98% 98% 100%  Weight:      Height:        Intake/Output Summary (Last 24 hours) at 10/03/2019 1711 Last data filed at 10/03/2019 0935 Gross per 24 hour  Intake 870 ml  Output --  Net 870 ml   Filed Weights   10/01/19 0303  Weight: 108.4 kg  Examination:   Constitutional: NAD, AAOx3 HEENT: conjunctivae and lids normal, EOMI CV: RRR no M,R,G. Distal pulses +2.  No cyanosis.   RESP: CTA B/L, normal respiratory effort  GI: +BS, NTND Extremities: No effusions, edema, or tenderness LLE.  Right foot in ACE wrap. SKIN: warm, dry and intact Neuro: II - XII grossly intact.  Sensation intact Psych: Normal mood and affect.  Appropriate judgement and  reason   Data Reviewed: I have personally reviewed following labs and imaging studies  CBC: Recent Labs  Lab 10/01/19 0702 10/02/19 0410 10/03/19 0518  WBC 6.3 7.6 6.0  NEUTROABS 4.4  --   --   HGB 9.9* 9.5* 8.8*  HCT 31.5* 30.9* 28.7*  MCV 96.3 97.2 96.6  PLT 215 197 170   Basic Metabolic Panel: Recent Labs  Lab 10/01/19 0702 10/02/19 0410 10/03/19 0518  NA 142 140 141  K 3.3* 4.4 4.2  CL 104 108 108  CO2 25 23 25   GLUCOSE 96 148* 108*  BUN 23 23 26*  CREATININE 1.56* 1.32* 1.46*  CALCIUM 8.4* 8.0* 8.3*  MG 1.6*  --  2.1   GFR: Estimated Creatinine Clearance: 44.2 mL/min (A) (by C-G formula based on SCr of 1.46 mg/dL (H)). Liver Function Tests: No results for input(s): AST, ALT, ALKPHOS, BILITOT, PROT, ALBUMIN in the last 168 hours. No results for input(s): LIPASE, AMYLASE in the last 168 hours. No results for input(s): AMMONIA in the last 168 hours. Coagulation Profile: Recent Labs  Lab 10/01/19 0702  INR 1.4*   Cardiac Enzymes: No results for input(s): CKTOTAL, CKMB, CKMBINDEX, TROPONINI in the last 168 hours. BNP (last 3 results) No results for input(s): PROBNP in the last 8760 hours. HbA1C: No results for input(s): HGBA1C in the last 72 hours. CBG: No results for input(s): GLUCAP in the last 168 hours. Lipid Profile: No results for input(s): CHOL, HDL, LDLCALC, TRIG, CHOLHDL, LDLDIRECT in the last 72 hours. Thyroid Function Tests: No results for input(s): TSH, T4TOTAL, FREET4, T3FREE, THYROIDAB in the last 72 hours. Anemia Panel: No results for input(s): VITAMINB12, FOLATE, FERRITIN, TIBC, IRON, RETICCTPCT in the last 72 hours. Sepsis Labs: No results for input(s): PROCALCITON, LATICACIDVEN in the last 168 hours.  Recent Results (from the past 240 hour(s))  SARS Coronavirus 2 by RT PCR (hospital order, performed in [facility] hospital lab) Nasopharyngeal Nasopharyngeal Swab     Status: None   Collection Time: 10/01/19  7:02 AM   Specimen:  Nasopharyngeal Swab  Result Value Ref Range Status   SARS Coronavirus 2 NEGATIVE NEGATIVE Final    Comment: (NOTE) SARS-CoV-2 target nucleic acids are NOT DETECTED. The SARS-CoV-2 RNA is generally detectable in upper and lower respiratory specimens during the acute phase of infection. The lowest concentration of SARS-CoV-2 viral copies this assay can detect is 250 copies / mL. A negative result does not preclude SARS-CoV-2 infection and should not be used as the sole basis for treatment or other patient management decisions.  A negative result may occur with improper specimen collection / handling, submission of specimen other than nasopharyngeal swab, presence of viral mutation(s) within the areas targeted by this assay, and inadequate number of viral copies (<250 copies / mL). A negative result must be combined with clinical observations, patient history, and epidemiological information. Fact Sheet for Patients:   12/01/19 Fact Sheet for Healthcare Providers: BoilerBrush.com.cy This test is not yet approved or cleared  by the https://pope.com/ FDA and has been authorized for detection and/or diagnosis of SARS-CoV-2  by FDA under an Emergency Use Authorization (EUA).  This EUA will remain in effect (meaning this test can be used) for the duration of the COVID-19 declaration under Section 564(b)(1) of the Act, 21 U.S.C. section 360bbb-3(b)(1), unless the authorization is terminated or revoked sooner. Performed at [facility], [address] [place], [place] [address]   SARS Coronavirus 2 by RT PCR (hospital order, performed in [facility] hospital lab) Nasopharyngeal Nasopharyngeal Swab     Status: None   Collection Time: 10/03/19  3:12 PM   Specimen: Nasopharyngeal Swab  Result Value Ref Range Status   SARS Coronavirus 2 NEGATIVE NEGATIVE Final    Comment: (NOTE) SARS-CoV-2 target nucleic acids are NOT DETECTED. The  SARS-CoV-2 RNA is generally detectable in upper and lower respiratory specimens during the acute phase of infection. The lowest concentration of SARS-CoV-2 viral copies this assay can detect is 250 copies / mL. A negative result does not preclude SARS-CoV-2 infection and should not be used as the sole basis for treatment or other patient management decisions.  A negative result may occur with improper specimen collection / handling, submission of specimen other than nasopharyngeal swab, presence of viral mutation(s) within the areas targeted by this assay, and inadequate number of viral copies (<250 copies / mL). A negative result must be combined with clinical observations, patient history, and epidemiological information. Fact Sheet for Patients:   BoilerBrush.com.cy Fact Sheet for Healthcare Providers: https://pope.com/ This test is not yet approved or cleared  by the [place] FDA and has been authorized for detection and/or diagnosis of SARS-CoV-2 by FDA under an Emergency Use Authorization (EUA).  This EUA will remain in effect (meaning this test can be used) for the duration of the COVID-19 declaration under Section 564(b)(1) of the Act, 21 U.S.C. section 360bbb-3(b)(1), unless the authorization is terminated or revoked sooner. Performed at [facility], [address], [place], [place] [address]       Radiology Studies: No results found.   Scheduled Meds: . amiodarone  200 mg Oral Daily  . apixaban  5 mg Oral BID  . carvedilol  6.25 mg Oral BID WC  . docusate sodium  100 mg Oral BID  . levothyroxine  100 mcg Oral Daily  . pantoprazole  40 mg Oral Daily  . rosuvastatin  10 mg Oral q1800  . venlafaxine  75 mg Oral BID   Continuous Infusions:   LOS: 2 days     Darlin Priestly, MD [facility] If 7PM-7AM, please contact night-coverage 10/03/2019, 5:11 PM

## 2019-10-04 DIAGNOSIS — Z6841 Body Mass Index (BMI) 40.0 and over, adult: Secondary | ICD-10-CM

## 2019-10-04 LAB — CBC
HCT: 29.8 % — ABNORMAL LOW (ref 36.0–46.0)
Hemoglobin: 9.3 g/dL — ABNORMAL LOW (ref 12.0–15.0)
MCH: 30.2 pg (ref 26.0–34.0)
MCHC: 31.2 g/dL (ref 30.0–36.0)
MCV: 96.8 fL (ref 80.0–100.0)
Platelets: 185 10*3/uL (ref 150–400)
RBC: 3.08 MIL/uL — ABNORMAL LOW (ref 3.87–5.11)
RDW: 17 % — ABNORMAL HIGH (ref 11.5–15.5)
WBC: 7.3 10*3/uL (ref 4.0–10.5)
nRBC: 0 % (ref 0.0–0.2)

## 2019-10-04 LAB — BASIC METABOLIC PANEL
Anion gap: 8 (ref 5–15)
BUN: 30 mg/dL — ABNORMAL HIGH (ref 8–23)
CO2: 24 mmol/L (ref 22–32)
Calcium: 9 mg/dL (ref 8.9–10.3)
Chloride: 108 mmol/L (ref 98–111)
Creatinine, Ser: 1.26 mg/dL — ABNORMAL HIGH (ref 0.44–1.00)
GFR calc Af Amer: 51 mL/min — ABNORMAL LOW (ref >60)
GFR calc non Af Amer: 44 mL/min — ABNORMAL LOW (ref >60)
Glucose, Bld: 98 mg/dL (ref 70–99)
Potassium: 4.7 mmol/L (ref 3.5–5.1)
Sodium: 140 mmol/L (ref 135–145)

## 2019-10-04 LAB — MAGNESIUM: Magnesium: 2.1 mg/dL (ref 1.7–2.4)

## 2019-10-04 MED ORDER — CARVEDILOL 12.5 MG PO TABS: 12.5000 mg | ORAL_TABLET | Freq: Two times a day (BID) | ORAL | Status: DC

## 2019-10-04 MED ORDER — ZOLPIDEM TARTRATE 10 MG PO TABS: 10.0000 mg | ORAL_TABLET | Freq: Every evening | ORAL | 0 refills | Status: DC | PRN

## 2019-10-04 MED ORDER — DOCUSATE SODIUM 100 MG PO CAPS: 100.0000 mg | ORAL_CAPSULE | Freq: Two times a day (BID) | ORAL | 0 refills | Status: DC

## 2019-10-04 MED ORDER — ACETAMINOPHEN 325 MG PO TABS: 325.0000 mg | ORAL_TABLET | Freq: Four times a day (QID) | ORAL | Status: DC | PRN

## 2019-10-04 MED ORDER — FUROSEMIDE 40 MG PO TABS: ORAL_TABLET | ORAL | 5 refills | Status: DC

## 2019-10-04 MED ORDER — DICLOFENAC SODIUM 1 % TD GEL
1.0000 "application " | Freq: Three times a day (TID) | TRANSDERMAL | Status: DC | PRN
  Administered 2019-10-04: 1 via TOPICAL
  Filled 2019-10-04: qty 100

## 2019-10-04 NOTE — Plan of Care (Signed)

## 2019-10-04 NOTE — TOC Progression Note (Signed)
Transition of Care [facility]) - Progression Note    Patient Details  Name: Stacey [name] MRN: [id number] Date of Birth: 1951/02/07  [facility] CM/SW Contact  Barrie Dunker, RN Phone Number: 10/04/2019, 2:55 PM  Clinical Narrative:    Patient ready to DC bedside nurse to call report to [facility], will go to [facility] I called [facility] they will be here at 5 PM to transport, The bedside nurse is aware        Expected Discharge Plan and Services           Expected Discharge Date: 10/04/19                                     Social Determinants of Health (SDOH) Interventions    Readmission Risk Interventions No flowsheet data found.

## 2019-10-04 NOTE — Discharge Summary (Signed)
Physician Discharge Summary   Stacey [name]  female DOB: 23-Aug-1950  WUJ:[id number]  PCP: Marguarite Arbour, MD  Admit date: 10/01/2019 Discharge date: 10/04/2019  Admitted From: home Disposition:  SNF CODE STATUS: Full code  Discharge Instructions    No wound care   Complete by: As directed        Hospital Course:  For full details, please see H&P, progress notes, consult notes and ancillary notes.  Briefly,  Stacey [name] a 69 y.o.Caucasian femalewith medical history significant ofhypertension, hyperlipidemia, GERD, hypothyroidism, anxiety, PAF on Eliquis, OSA on CPAP, CAD, drug-induced torsade de pointes, morbid obesity,dCHF, who presented with fall and right ankle pain.   Closed fracture of right distal fibula s/p ORIF on 10/01/19 As evidenced by x-ray. No neurovascular compromise. Orthopedic surgeon, Dr.Poggiwas consulted.Given her significant cardiac history, cardiology, cardiology was also consulted for presurgical clearance.  Pt tolerated the surgery well, with minimum pain afterwards.  Pt will follow-up with [facility] in 10-14 days for staple removal and repeat x-rays.  Non-weightbearing to the right leg.  AKI, POA, improved Cr 1.56 on presentation.  Baseline ~1.  Cr improved some with MIVF.  Cr 1.26 on the day of discharge.  Home Lasix held pending outpatient followup.  Home Entresto held during hospitalization but resumed after discharge, per cardiology rec.  Chronic diastolic CHF (congestive heart failure) (HCC), compensated 2D echo on 12/06/2017 showed EF of 50-50% with grade 1 diastolic dysfunction. Patient has trace leg edema, but no shortness of breath. Pt continued on home Coreg.  Home Entresto held due to low blood pressures and AKI but resumed at discharge per cards rec.  Home Lasix held pending outpatient followup.  GERD (gastroesophageal reflux disease) Continued protonix  Atrial fibrillation (HCC) on Eliquis continued Eliquis,  amiodarone and Coreg  Coronary artery disease:no CP Continued crestor  Sleep apnea Continued CPAP  HTN Pt continued on home Coreg.  Home Entresto held due to low blood pressures and AKI but resumed at discharge per cards rec.  Home Lasix held pending outpatient followup.  Hypothyroidism: TSH 1.482 on 07/11/2019 Continued Synthroid  Normocytic anemia Hgb stable in 9's.  Hypokalemia and hypomag Repleted PRN  Morbid obesity with BMI of 40.0-44.9, adult (HCC) Diet and exercise.Encouraged to lose weight.   Discharge Diagnoses:  Principal Problem:   Closed fracture of right distal fibula Active Problems:   Chronic diastolic CHF (congestive heart failure) (HCC)   GERD (gastroesophageal reflux disease)   Atrial fibrillation (HCC)   Coronary artery disease   Sleep apnea   Essential hypertension   Hypothyroidism   Normocytic anemia   CKD (chronic kidney disease), stage IIIa   Morbid obesity with BMI of 40.0-44.9, adult [facility])   Preop cardiovascular exam    Discharge Instructions:  Allergies as of 10/04/2019      Reactions   Levaquin [levofloxacin In D5w] Other (See Comments)   Severe dizziness   Metoprolol Itching, Nausea And Vomiting, Rash, Other (See Comments)   Dizziness   Tikosyn [dofetilide] Other (See Comments)   Developed Torsades on Tikosyn 09/28/17   Ace Inhibitors Cough      Medication List    STOP taking these medications   HYDROcodone-acetaminophen 10-325 MG tablet Commonly known as: NORCO     TAKE these medications   acetaminophen 325 MG tablet Commonly known as: TYLENOL Take 1-2 tablets (325-650 mg total) by mouth every 6 (six) hours as needed for mild pain (pain score 1-3 or temp > 100.5).   amiodarone 200 MG tablet  Commonly known as: PACERONE TAKE 1 TABLET(200 MG) BY MOUTH DAILY   apixaban 5 MG Tabs tablet Commonly known as: ELIQUIS Take 1 tablet (5 mg total) by mouth 2 (two) times daily.   carvedilol 12.5 MG tablet Commonly  known as: COREG TAKE 1 TABLET(12.5 MG) BY MOUTH TWICE DAILY WITH A MEAL   diclofenac sodium 1 % Gel Commonly known as: VOLTAREN Apply 1 application topically 3 (three) times daily as needed (knee pain).   docusate sodium 100 MG capsule Commonly known as: COLACE Take 1 capsule (100 mg total) by mouth 2 (two) times daily.   esomeprazole 40 MG capsule Commonly known as: NEXIUM TAKE 1 CAPSULE BY MOUTH EVERY MORNING What changed: when to take this   furosemide 40 MG tablet Commonly known as: LASIX Hold until PCP followup due to AKI. What changed:   how much to take  how to take this  when to take this  additional instructions   levothyroxine 100 MCG tablet Commonly known as: SYNTHROID Take 100 mcg by mouth daily.   oxyCODONE 5 MG immediate release tablet Commonly known as: Oxy IR/ROXICODONE Take 1-2 tablets (5-10 mg total) by mouth every 4 (four) hours as needed for moderate pain.   rosuvastatin 10 MG tablet Commonly known as: CRESTOR Take 10 mg by mouth at bedtime.   sacubitril-valsartan 49-51 MG Commonly known as: ENTRESTO Take 1 tablet by mouth 2 (two) times daily.   venlafaxine 75 MG tablet Commonly known as: EFFEXOR Take 75 mg by mouth 2 (two) times daily.   zolpidem 10 MG tablet Commonly known as: AMBIEN Take 1 tablet (10 mg total) by mouth at bedtime as needed for sleep. What changed:   when to take this  reasons to take this        Contact information for follow-up providers    Poggi, Marshall Cork, MD. Call today.   Specialty: Orthopedic Surgery Contact information: [place] [address] [phone]        Idelle Crouch, MD In 3 days.   Specialty: Internal Medicine Why: As needed Contact information: [address] [facility] [place] [address] [phone]        Lattie Corns, PA-C Follow up in 10 day(s).   Specialty: Physician Assistant Why: for staple  removal Contact information: [place] [address] [phone]            Contact information for after-discharge care    Destination    [facility] .   Service: Skilled Nursing Contact information: [address] [place] [phone]                  Allergies  Allergen Reactions  . Levaquin [Levofloxacin In D5w] Other (See Comments)    Severe dizziness   . Metoprolol Itching, Nausea And Vomiting, Rash and Other (See Comments)    Dizziness  . Tikosyn [Dofetilide] Other (See Comments)    Developed Torsades on Tikosyn 09/28/17  . Ace Inhibitors Cough     The results of significant diagnostics from this hospitalization (including imaging, microbiology, ancillary and laboratory) are listed below for reference.   Consultations:   Procedures/Studies: DG Ankle 2 Views Right  Result Date: 10/01/2019 CLINICAL DATA:  Known right fibular fracture EXAM: RIGHT ANKLE - 2 VIEW; DG C-ARM 1-60 MIN COMPARISON:  Films from earlier in the same day. FLUOROSCOPY TIME:  Fluoroscopy Time:  13 seconds  Radiation Exposure Index (if provided by the fluoroscopic device): Not available Number of Acquired Spot Images: 4 FINDINGS: Fixation sideplate is now seen along the distal fibula with multiple fixation screws. Fracture fragments are in anatomic alignment. Reduction of the talar displacement is noted. IMPRESSION: ORIF of right distal fibular fracture Electronically Signed   By: Alcide Clever M.D.   On: 10/01/2019 13:26   DG Ankle 2 Views Right  Result Date: 10/01/2019 CLINICAL DATA:  Post reduction right ankle fracture. EXAM: RIGHT ANKLE - 2 VIEW COMPARISON:  10/01/2019 at 5 a.m. FINDINGS: Alignment is improved from the earlier exam, most evident along lateral view, where the posterior subluxation of the talus in relation to distal tibia has been reduced. Lateral talar subluxation as and mildly  improved, with 4-5 mm of lateral subluxation persisting. There is 4 mm of lateral 6 mm of posterior displacement the distal fibular fracture. IMPRESSION: 1. Improved alignment of the ankle joint distal fibular fracture following closed reduction. Electronically Signed   By: Amie Portland M.D.   On: 10/01/2019 08:14   DG Ankle 2 Views Right  Result Date: 10/01/2019 CLINICAL DATA:  Right ankle fracture reduction EXAM: RIGHT ANKLE - 2 VIEW COMPARISON:  Earlier today FINDINGS: Distal diaphysis fracture of the fibula and medial soft tissue injury with lateral and posterior malalignment at the ankle. Posterior displacement with marked anterior joint space widening is worsened from prior study. A splint has been placed. IMPRESSION: Worsening alignment with interval marked posterior displacement of the ankle. Electronically Signed   By: Marnee Spring M.D.   On: 10/01/2019 05:48   DG Ankle Complete Right  Result Date: 10/01/2019 CLINICAL DATA:  Right ankle pain, swelling, and ecchymosis after fall. EXAM: RIGHT ANKLE - COMPLETE 3+ VIEW COMPARISON:  None. FINDINGS: Displaced distal fibular fracture at and just above the level of the ankle mortise. There is disruption of the ankle mortise with widening of the medial clear space and probable avulsion injury from the medial malleolus. No definite posterior tibial tubercle fracture. There is generalized soft tissue edema. Ankle joint effusion. Plantar calcaneal spur. IMPRESSION: Displaced distal fibular fracture with probable avulsion injury from the medial malleolus. Disruption of the ankle mortise with widening of the medial clear space. No definite posterior malleolar fracture. Electronically Signed   By: Narda Rutherford M.D.   On: 10/01/2019 03:32   DG Chest Portable 1 View  Result Date: 10/01/2019 CLINICAL DATA:  Right ankle fracture EXAM: PORTABLE CHEST 1 VIEW COMPARISON:  11/15/2013 FINDINGS: Cardiac silhouette is normal in size. No mediastinal or hilar masses.  Clear lungs. No pleural effusion or pneumothorax. Skeletal structures are grossly intact IMPRESSION: No acute cardiopulmonary disease. Electronically Signed   By: Amie Portland M.D.   On: 10/01/2019 08:15   DG C-Arm 1-60 Min  Result Date: 10/01/2019 CLINICAL DATA:  Known right fibular fracture EXAM: RIGHT ANKLE - 2 VIEW; DG C-ARM 1-60 MIN COMPARISON:  Films from earlier in the same day. FLUOROSCOPY TIME:  Fluoroscopy Time:  13 seconds Radiation Exposure Index (if provided by the fluoroscopic device): Not available Number of Acquired Spot Images: 4 FINDINGS: Fixation sideplate is now seen along the distal fibula with multiple fixation screws. Fracture fragments are in anatomic alignment. Reduction of the talar displacement is noted. IMPRESSION: ORIF of right distal fibular fracture Electronically Signed   By: Alcide Clever M.D.   On: 10/01/2019 13:26      Labs: BNP (last 3 results) Recent Labs    10/01/19 0702  BNP  328.1*   Basic Metabolic Panel: Recent Labs  Lab 10/01/19 0702 10/02/19 0410 10/03/19 0518 10/04/19 0427  NA 142 140 141 140  K 3.3* 4.4 4.2 4.7  CL 104 108 108 108  CO2 25 23 25 24   GLUCOSE 96 148* 108* 98  BUN 23 23 26* 30*  CREATININE 1.56* 1.32* 1.46* 1.26*  CALCIUM 8.4* 8.0* 8.3* 9.0  MG 1.6*  --  2.1 2.1   Liver Function Tests: No results for input(s): AST, ALT, ALKPHOS, BILITOT, PROT, ALBUMIN in the last 168 hours. No results for input(s): LIPASE, AMYLASE in the last 168 hours. No results for input(s): AMMONIA in the last 168 hours. CBC: Recent Labs  Lab 10/01/19 0702 10/02/19 0410 10/03/19 0518 10/04/19 0427  WBC 6.3 7.6 6.0 7.3  NEUTROABS 4.4  --   --   --   HGB 9.9* 9.5* 8.8* 9.3*  HCT 31.5* 30.9* 28.7* 29.8*  MCV 96.3 97.2 96.6 96.8  PLT 215 197 170 185   Cardiac Enzymes: No results for input(s): CKTOTAL, CKMB, CKMBINDEX, TROPONINI in the last 168 hours. BNP: Invalid input(s): POCBNP CBG: No results for input(s): GLUCAP in the last 168  hours. D-Dimer No results for input(s): DDIMER in the last 72 hours. Hgb A1c No results for input(s): HGBA1C in the last 72 hours. Lipid Profile No results for input(s): CHOL, HDL, LDLCALC, TRIG, CHOLHDL, LDLDIRECT in the last 72 hours. Thyroid function studies No results for input(s): TSH, T4TOTAL, T3FREE, THYROIDAB in the last 72 hours.  Invalid input(s): FREET3 Anemia work up No results for input(s): VITAMINB12, FOLATE, FERRITIN, TIBC, IRON, RETICCTPCT in the last 72 hours. Urinalysis No results found for: COLORURINE, APPEARANCEUR, LABSPEC, PHURINE, GLUCOSEU, HGBUR, BILIRUBINUR, KETONESUR, PROTEINUR, UROBILINOGEN, NITRITE, LEUKOCYTESUR Sepsis Labs Invalid input(s): PROCALCITONIN,  WBC,  LACTICIDVEN Microbiology Recent Results (from the past 240 hour(s))  SARS Coronavirus 2 by RT PCR (hospital order, performed in [facility] hospital lab) Nasopharyngeal Nasopharyngeal Swab     Status: None   Collection Time: 10/01/19  7:02 AM   Specimen: Nasopharyngeal Swab  Result Value Ref Range Status   SARS Coronavirus 2 NEGATIVE NEGATIVE Final    Comment: (NOTE) SARS-CoV-2 target nucleic acids are NOT DETECTED. The SARS-CoV-2 RNA is generally detectable in upper and lower respiratory specimens during the acute phase of infection. The lowest concentration of SARS-CoV-2 viral copies this assay can detect is 250 copies / mL. A negative result does not preclude SARS-CoV-2 infection and should not be used as the sole basis for treatment or other patient management decisions.  A negative result may occur with improper specimen collection / handling, submission of specimen other than nasopharyngeal swab, presence of viral mutation(s) within the areas targeted by this assay, and inadequate number of viral copies (<250 copies / mL). A negative result must be combined with clinical observations, patient history, and epidemiological information. Fact Sheet for Patients:    12/01/19 Fact Sheet for Healthcare Providers: BoilerBrush.com.cy This test is not yet approved or cleared  by the https://pope.com/ FDA and has been authorized for detection and/or diagnosis of SARS-CoV-2 by FDA under an Emergency Use Authorization (EUA).  This EUA will remain in effect (meaning this test can be used) for the duration of the COVID-19 declaration under Section 564(b)(1) of the Act, 21 U.S.C. section 360bbb-3(b)(1), unless the authorization is terminated or revoked sooner. Performed at [facility], [address] [place], [place]   SARS Coronavirus 2 by RT PCR (hospital order, performed in [facility]  hospital lab) Nasopharyngeal Nasopharyngeal Swab     Status: None   Collection Time: 10/03/19  3:12 PM   Specimen: Nasopharyngeal Swab  Result Value Ref Range Status   SARS Coronavirus 2 NEGATIVE NEGATIVE Final    Comment: (NOTE) SARS-CoV-2 target nucleic acids are NOT DETECTED. The SARS-CoV-2 RNA is generally detectable in upper and lower respiratory specimens during the acute phase of infection. The lowest concentration of SARS-CoV-2 viral copies this assay can detect is 250 copies / mL. A negative result does not preclude SARS-CoV-2 infection and should not be used as the sole basis for treatment or other patient management decisions.  A negative result may occur with improper specimen collection / handling, submission of specimen other than nasopharyngeal swab, presence of viral mutation(s) within the areas targeted by this assay, and inadequate number of viral copies (<250 copies / mL). A negative result must be combined with clinical observations, patient history, and epidemiological information. Fact Sheet for Patients:   BoilerBrush.com.cy Fact Sheet for Healthcare Providers: https://pope.com/ This test is not yet approved or cleared  by the  [place] FDA and has been authorized for detection and/or diagnosis of SARS-CoV-2 by FDA under an Emergency Use Authorization (EUA).  This EUA will remain in effect (meaning this test can be used) for the duration of the COVID-19 declaration under Section 564(b)(1) of the Act, 21 U.S.C. section 360bbb-3(b)(1), unless the authorization is terminated or revoked sooner. Performed at [facility], [address] [place], [place] [address]      Total time spend on discharging this patient, including the last patient exam, discussing the hospital stay, instructions for ongoing care as it relates to all pertinent caregivers, as well as preparing the medical discharge records, prescriptions, and/or referrals as applicable, is 35 minutes.    Darlin Priestly, MD  [facility] 10/04/2019, 1:01 PM  If 7PM-7AM, please contact night-coverage

## 2019-10-04 NOTE — Progress Notes (Signed)
Occupational Therapy Treatment Patient Details Name: Stacey [name] MRN: [id number] DOB: 04/22/1951 Today's Date: 10/04/2019    History of present illness 69 y/o female s/p fall at home with R fibular fracture; ORIF 6/7. PMH includes: HTN, HLD, and GERD.   OT comments  Pt seen for OT tx this dat to f/u re: safety with self care ADLs/ADL mobility. She demos overall improved fxl activity tolerance. Pt tolerates sit to stand x1 trial with MIN A with RW and SPS x2 trials with RW with MIN A w/ MIN verbal cues to maintain NWB to R LE. Pt requires MOD/MAX for LB bathing, MIN/MOD A for LB dressing using sit to stand technique and setup for peri care after voiding using sit/lateral lean method. Pt to chair at end of session with LEs elevated and all needs within reach. OT notifies front desk that pt requesting pain medication after treatment as this Thereasa Parkin was unable to contact RN by phone. Overall, as pt was Indep at baseline and is still requiring assist with most aspects of self care and transfers, anticipate SNF is still most prudent d/c recommendation.    Follow Up Recommendations  SNF    Equipment Recommendations  3 in 1 bedside commode;Tub/shower seat    Recommendations for Other Services      Precautions / Restrictions Precautions Precautions: Fall Restrictions Weight Bearing Restrictions: Yes RLE Weight Bearing: Non weight bearing       Mobility Bed Mobility Overal bed mobility: Modified Independent                Transfers Overall transfer level: Needs assistance Equipment used: Rolling walker (2 wheeled) Transfers: Sit to/from [employer] Sit to Stand: Min assist Stand pivot transfers: Min assist            Balance Overall balance assessment: Needs assistance Sitting-balance support: No upper extremity supported Sitting balance-Leahy Scale: Good     Standing balance support: Bilateral upper extremity supported Standing balance-Leahy Scale:  Fair Standing balance comment: rqequires B UE support                           ADL either performed or assessed with clinical judgement   ADL Overall ADL's : Needs assistance/impaired         Upper Body Bathing: Set up;Sitting Upper Body Bathing Details (indicate cue type and reason): OT obtains ADL items for UB bathing d/t pt with limited tolerance for fxl mobility, but pt able to perform task once items provided. Lower Body Bathing: Moderate assistance;Maximal assistance;Sit to/from stand Lower Body Bathing Details (indicate cue type and reason): In standing, pt uses RW to sustain static stand, but then requires MOD/MAX A to complete peri care d/t requring use of UEs to sustain balance. Upper Body Dressing : Set up;Sitting   Lower Body Dressing: Minimal assistance;Moderate assistance;Sit to/from stand Lower Body Dressing Details (indicate cue type and reason): pt has loose fitting elastic waist shorts that she is able to thread with MOD I in sitting and req's MIN/MOD A to manage over hips in standing, does demo some good standing balance to alternate R hand off walker to adjust in front. Toilet Transfer: Minimal assistance;Stand-pivot;BSC;RW   Toileting- Clothing Manipulation and Hygiene: Set up;Sitting/lateral lean               Vision Baseline Vision/History: Wears glasses Wears Glasses: At all times Patient Visual Report: No change from baseline     Perception  Praxis      Cognition Arousal/Alertness: Awake/alert Behavior During Therapy: WFL for tasks assessed/performed Overall Cognitive Status: Within Functional Limits for tasks assessed                                          Exercises Other Exercises Other Exercises: OT facilitates pt participatin in seated and standing bathing tasks (setup sitting, MOD/MAX standing) as well as UB/LB dressing tasks (setup UB, MIN/MOD LB with sit to/from stand). Pt tolerates well. Toilets during  session as well with MIN A to pivot and setup to lean for peri care following voiding. Pt then pivots a second time to recliner with all necessities within reach for a total of 3 stands during session. Pt tolerates well. Education provided re: use of reacher PRN for LBDsg on R LE. Good reception.   Shoulder Instructions       General Comments      Pertinent Vitals/ Pain       Pain Assessment: 0-10 Pain Score: 6  Pain Location: R leg Pain Descriptors / Indicators: Grimacing;Guarding;Aching Pain Intervention(s): Limited activity within patient's tolerance;Monitored during session;Patient requesting pain meds-RN notified  Home Living                                          Prior Functioning/Environment              Frequency  Min 2X/week        Progress Toward Goals  OT Goals(current goals can now be found in the care plan section)  Progress towards OT goals: Progressing toward goals  Acute Rehab OT Goals Patient Stated Goal: get back to walking well OT Goal Formulation: With patient Time For Goal Achievement: 10/16/19 Potential to Achieve Goals: Good  Plan Discharge plan remains appropriate;Frequency remains appropriate    Co-evaluation                 AM-PAC OT "6 Clicks" Daily Activity     Outcome Measure   Help from another person eating meals?: None Help from another person taking care of personal grooming?: None Help from another person toileting, which includes using toliet, bedpan, or urinal?: A Lot Help from another person bathing (including washing, rinsing, drying)?: A Lot Help from another person to put on and taking off regular upper body clothing?: A Little Help from another person to put on and taking off regular lower body clothing?: A Lot 6 Click Score: 17    End of Session Equipment Utilized During Treatment: Gait belt;Rolling walker  OT Visit Diagnosis: Unsteadiness on feet (R26.81);Muscle weakness (generalized)  (M62.81);History of falling (Z91.81)   Activity Tolerance Patient tolerated treatment well   Patient Left in bed;with call bell/phone within reach   Nurse Communication Mobility status        Time: 3295-1884 OT Time Calculation (min): 69 min  Charges: OT Treatments $Self Care/Home Management : 23-37 mins $Therapeutic Activity: 23-37 mins  Gerrianne Scale, MS, OTR/L ascom [phone] 10/04/19, 11:47 AM

## 2019-10-04 NOTE — Care Management Important Message (Signed)
Important Message  Patient Details  Name: Stacey [name] MRN: [id number] Date of Birth: [birth year]   Medicare Important Message Given:  Yes     Johnell Comings 10/04/2019, 1:42 PM

## 2019-10-04 NOTE — TOC Progression Note (Signed)
Transition of Care [facility]) - Progression Note    Patient Details  Name: Stacey [name] MRN: [id number] Date of Birth: 03-29-1951  Transition of Care [facility]) CM/SW Contact  Barrie Dunker, RN Phone Number: 10/04/2019, 1:56 PM  Clinical Narrative:    Sherron Monday with [facility] at [facility], I provided information that the patient has been vaccinated and her husband will bring the vaccine card to the facility, She will call me in a few min with a bed number       Expected Discharge Plan and Services           Expected Discharge Date: 10/04/19                                     Social Determinants of Health (SDOH) Interventions    Readmission Risk Interventions No flowsheet data found.

## 2019-10-04 NOTE — Progress Notes (Addendum)
Progress Note  Patient Name: Stacey [name] Date of Encounter: 10/04/2019  Primary Cardiologist: [name], MD   Subjective   S/p ORIF 10/01/19 with resumption of Eliquis. Pain well controlled. Reports only itching with her RLE wrapped. Completed PT today and doing well. Plan for SNF later today.   No reported CP or SOB.   Inpatient Medications    Scheduled Meds: . amiodarone  200 mg Oral Daily  . apixaban  5 mg Oral BID  . carvedilol  12.5 mg Oral BID WC  . docusate sodium  100 mg Oral BID  . levothyroxine  100 mcg Oral Daily  . pantoprazole  40 mg Oral Daily  . rosuvastatin  10 mg Oral q1800  . venlafaxine  75 mg Oral BID   Continuous Infusions:  PRN Meds: acetaminophen, albuterol, bisacodyl, hydrALAZINE, magnesium hydroxide, methocarbamol, morphine injection, ondansetron **OR** ondansetron (ZOFRAN) IV, oxyCODONE, senna-docusate, sodium phosphate, zolpidem   Vital Signs    Vitals:   10/03/19 0725 10/03/19 1740 10/03/19 2257 10/04/19 0731  BP: (!) 125/59 (!) 129/55 (!) 115/51 (!) 141/55  Pulse: 60 69 67 82  Resp: 17 16 16 18   Temp: 98.3 F (36.8 C) 98 F (36.7 C) 98.6 F (37 C) 99.6 F (37.6 C)  TempSrc:  Oral Oral Oral  SpO2: 100% 95% 93% 94%  Weight:      Height:        Intake/Output Summary (Last 24 hours) at 10/04/2019 0902 Last data filed at 10/03/2019 0935 Gross per 24 hour  Intake 360 ml  Output --  Net 360 ml   Last 3 Weights 10/01/2019 06/21/2019 12/19/2018  Weight (lbs) 239 lb 241 lb 12 oz 240 lb  Weight (kg) 108.41 kg 109.657 kg 108.863 kg      Telemetry    Not on tele - Personally Reviewed  ECG    No new tracings - Personally Reviewed  Physical Exam   GEN: No acute distress.   Neck: No JVD Cardiac: RRR, 1/6 systolic murmur RUSB, rubs, or gallops.  Respiratory: Clear to auscultation bilaterally. GI: Soft, nontender, non-distended  MS: No edema; No deformity. RLE s/p ORIF Neuro:  Nonfocal  Psych: Normal affect   Labs    High  Sensitivity Troponin:  No results for input(s): TROPONINIHS in the last 720 hours.    Chemistry Recent Labs  Lab 10/02/19 0410 10/03/19 0518 10/04/19 0427  NA 140 141 140  K 4.4 4.2 4.7  CL 108 108 108  CO2 23 25 24   GLUCOSE 148* 108* 98  BUN 23 26* 30*  CREATININE 1.32* 1.46* 1.26*  CALCIUM 8.0* 8.3* 9.0  GFRNONAA 42* 37* 44*  GFRAA 48* 43* 51*  ANIONGAP 9 8 8      Hematology Recent Labs  Lab 10/02/19 0410 10/03/19 0518 10/04/19 0427  WBC 7.6 6.0 7.3  RBC 3.18* 2.97* 3.08*  HGB 9.5* 8.8* 9.3*  HCT 30.9* 28.7* 29.8*  MCV 97.2 96.6 96.8  MCH 29.9 29.6 30.2  MCHC 30.7 30.7 31.2  RDW 16.2* 16.9* 17.0*  PLT 197 170 185    BNP Recent Labs  Lab 10/01/19 0702  BNP 328.1*     DDimer No results for input(s): DDIMER in the last 168 hours.   Radiology    No results found.  Cardiac Studies   Echo 12/06/2017 Procedure narrative: Transthoracic echocardiography. Image  quality was suboptimal. The study was technically difficult, as a  result of poor acoustic windows. Intravenous contrast (Definity)  was administered.  - Left  ventricle: Systolic function was normal. The estimated  ejection fraction was in the range of 50% to 55%. Doppler  parameters are consistent with abnormal left ventricular  relaxation (grade 1 diastolic dysfunction).  - Left atrium: The atrium was mildly dilated.  - Right ventricle: The cavity size was normal. Systolic function  was normal.  - Right atrium: The atrium was mildly dilated.  Impressions:  - LVEF has improved significantly since prior echo on 09/13/17.   2015 Cath See scan in EMR Nonobstructive CAD  Patient Profile     69 y.o. 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  Assessment & Plan    Preoperative cardiovascular evaluation, ORIF of R ankle --Asked to evaluate from cardiac standpoint prior to surgery. Proceeded to surgery without further cardiac workup. Now s/p ORIF with resumption of Eliquis, amiodarone, and BB.  She is maintaining NSR with rate well controlled.  PAF --Maintaining NSR. Continue amiodarone with monitoring of TSH, liver function, and pulmonary symptoms. High risk of return to Afib. BB decreased yesterday due to low BP. Elevated SBP/HR this AM with SBP 140s and HR 80s-90s. Increased BB back to PTA dosing with Coreg 12.5mg  BID. Continue with Eliquis 5mg  BID, already restarted after ORIF.  Chronic HFpEF --No s/sx of decompensated heart failure.  Euvolemic on exam.  EF 50 to 55% and G1DD. Increased carvedilol back to PTA dosing after reduced yesterday 2/2 soft BP. Continue Coreg 12.5mg  BID. Resume PTA Entresto 49-51mg  (held at this time) before discharge if BP and renal function allows.   OSA --Continue CPAP.  Essential hypertension --Soft BP yesterday with PTA BB dose reduced. Will increase back to PTA dosing this AM for elevated BP and HR 80-90s. Resume Entresto 49-51mg  before discharge if BP and renal function allows.  Anemia --Daily CBC. Hgb 9.3 and at baseline for this admission.   AOCKD --Close monitoring of renal function.  Renally dose medications as needed. Cr improved from 10/03/19.  For questions or updates, please contact [facility] Please consult [email or web address] for contact info under        Signed, Arvil Chaco, PA-C  10/04/2019, 9:02 AM

## 2019-10-04 NOTE — Progress Notes (Signed)
  Subjective: 3 Days Post-Op Procedure(s) (LRB): OPEN REDUCTION INTERNAL FIXATION (ORIF) ANKLE FRACTURE (Right) Patient reports pain as well-controlled.   Patient is well, and has had no acute complaints or problems Plan is to go Skilled nursing facility later today. Negative for chest pain and shortness of breath Fever: no Gastrointestinal: negative for nausea and vomiting.   Patient has had a bowel movement.  Objective: Vital signs in last 24 hours: Temp:  [98 F (36.7 C)-99.6 F (37.6 C)] 99.6 F (37.6 C) (06/10 0731) Pulse Rate:  [67-82] 82 (06/10 0731) Resp:  [16-18] 18 (06/10 0731) BP: (115-141)/(51-55) 141/55 (06/10 0731) SpO2:  [93 %-95 %] 94 % (06/10 0731)  Intake/Output from previous day:  Intake/Output Summary (Last 24 hours) at 10/04/2019 0833 Last data filed at 10/03/2019 0935 Gross per 24 hour  Intake 360 ml  Output --  Net 360 ml    Intake/Output this shift: No intake/output data recorded.  Labs: Recent Labs    10/02/19 0410 10/03/19 0518 10/04/19 0427  HGB 9.5* 8.8* 9.3*   Recent Labs    10/03/19 0518 10/04/19 0427  WBC 6.0 7.3  RBC 2.97* 3.08*  HCT 28.7* 29.8*  PLT 170 185   Recent Labs    10/03/19 0518 10/04/19 0427  NA 141 140  K 4.2 4.7  CL 108 108  CO2 25 24  BUN 26* 30*  CREATININE 1.46* 1.26*  GLUCOSE 108* 98  CALCIUM 8.3* 9.0   No results for input(s): LABPT, INR in the last 72 hours.   EXAM General - Patient is Alert, Appropriate and Oriented Extremity - Neurovascular intact Compartment soft   Dressing/Incision -post-operative splint remains in place  Motor Function - intact, moving foot and toes well on exam.     Assessment/Plan: 3 Days Post-Op Procedure(s) (LRB): OPEN REDUCTION INTERNAL FIXATION (ORIF) ANKLE FRACTURE (Right) Principal Problem:   Closed fracture of right distal fibula Active Problems:   Chronic diastolic CHF (congestive heart failure) (HCC)   GERD (gastroesophageal reflux disease)   Atrial  fibrillation (HCC)   Coronary artery disease   Sleep apnea   Essential hypertension   Hypothyroidism   Normocytic anemia   CKD (chronic kidney disease), stage IIIa   Morbid obesity with BMI of 40.0-44.9, adult (HCC)   Preop cardiovascular exam  Estimated body mass index is 42.34 kg/m as calculated from the following:   Height as of this encounter: 5\' 3"  (1.6 m).   Weight as of this encounter: 108.4 kg. Advance diet Up with therapy Discharge to SNF pending medical clearance Discharge home on Eliquis  Proximal dressing over abrasion changed. Follow-up on Tuesday at [facility] for bilateral knee injections. Patient is to follow-up in 10-14 days for staple removal with 01-19-1979.    DVT Prophylaxis - Eloquis Non weight-bearing as tolerated to right leg  Horris Latino, PA-C [facility] Orth[facility] 10/04/2019, 8:33 AM

## 2019-10-11 ENCOUNTER — Emergency Department: Payer: Medicare Other

## 2019-10-11 ENCOUNTER — Inpatient Hospital Stay
Admission: EM | Admit: 2019-10-11 | Discharge: 2019-10-15 | DRG: 291 | Disposition: A | Discharge status: Skilled Nursing Facility | Payer: Medicare Other | Attending: Internal Medicine | Admitting: Internal Medicine

## 2019-10-11 ENCOUNTER — Inpatient Hospital Stay (HOSPITAL_COMMUNITY)
Admit: 2019-10-11 | Discharge: 2019-10-11 | Disposition: A | Discharge status: Home / Self Care | Payer: Medicare Other | Attending: Family Medicine | Admitting: Family Medicine

## 2019-10-11 ENCOUNTER — Other Ambulatory Visit: Payer: Self-pay

## 2019-10-11 DIAGNOSIS — B9620 Unspecified Escherichia coli [E. coli] as the cause of diseases classified elsewhere: Secondary | ICD-10-CM | POA: Diagnosis present

## 2019-10-11 DIAGNOSIS — I5031 Acute diastolic (congestive) heart failure: Secondary | ICD-10-CM | POA: Diagnosis not present

## 2019-10-11 DIAGNOSIS — I5033 Acute on chronic diastolic (congestive) heart failure: Secondary | ICD-10-CM | POA: Diagnosis present

## 2019-10-11 DIAGNOSIS — R7989 Other specified abnormal findings of blood chemistry: Secondary | ICD-10-CM | POA: Diagnosis not present

## 2019-10-11 DIAGNOSIS — Z20822 Contact with and (suspected) exposure to covid-19: Secondary | ICD-10-CM | POA: Diagnosis present

## 2019-10-11 DIAGNOSIS — N1831 Chronic kidney disease, stage 3a: Secondary | ICD-10-CM | POA: Diagnosis present

## 2019-10-11 DIAGNOSIS — E876 Hypokalemia: Secondary | ICD-10-CM | POA: Diagnosis present

## 2019-10-11 DIAGNOSIS — R7881 Bacteremia: Secondary | ICD-10-CM | POA: Diagnosis present

## 2019-10-11 DIAGNOSIS — I10 Essential (primary) hypertension: Secondary | ICD-10-CM | POA: Diagnosis not present

## 2019-10-11 DIAGNOSIS — I480 Paroxysmal atrial fibrillation: Secondary | ICD-10-CM | POA: Diagnosis present

## 2019-10-11 DIAGNOSIS — G4733 Obstructive sleep apnea (adult) (pediatric): Secondary | ICD-10-CM | POA: Diagnosis present

## 2019-10-11 DIAGNOSIS — I130 Hypertensive heart and chronic kidney disease with heart failure and stage 1 through stage 4 chronic kidney disease, or unspecified chronic kidney disease: Principal | ICD-10-CM | POA: Diagnosis present

## 2019-10-11 DIAGNOSIS — E872 Acidosis: Secondary | ICD-10-CM | POA: Diagnosis present

## 2019-10-11 DIAGNOSIS — I509 Heart failure, unspecified: Secondary | ICD-10-CM | POA: Diagnosis present

## 2019-10-11 DIAGNOSIS — N179 Acute kidney failure, unspecified: Secondary | ICD-10-CM | POA: Diagnosis not present

## 2019-10-11 DIAGNOSIS — R945 Abnormal results of liver function studies: Secondary | ICD-10-CM | POA: Diagnosis present

## 2019-10-11 DIAGNOSIS — E785 Hyperlipidemia, unspecified: Secondary | ICD-10-CM | POA: Diagnosis present

## 2019-10-11 DIAGNOSIS — E039 Hypothyroidism, unspecified: Secondary | ICD-10-CM

## 2019-10-11 DIAGNOSIS — K219 Gastro-esophageal reflux disease without esophagitis: Secondary | ICD-10-CM | POA: Diagnosis present

## 2019-10-11 DIAGNOSIS — M170 Bilateral primary osteoarthritis of knee: Secondary | ICD-10-CM | POA: Diagnosis present

## 2019-10-11 DIAGNOSIS — R001 Bradycardia, unspecified: Secondary | ICD-10-CM | POA: Diagnosis present

## 2019-10-11 DIAGNOSIS — Z8701 Personal history of pneumonia (recurrent): Secondary | ICD-10-CM | POA: Diagnosis not present

## 2019-10-11 DIAGNOSIS — F419 Anxiety disorder, unspecified: Secondary | ICD-10-CM | POA: Diagnosis present

## 2019-10-11 DIAGNOSIS — I700 Atherosclerosis of aorta: Secondary | ICD-10-CM | POA: Diagnosis present

## 2019-10-11 DIAGNOSIS — J9601 Acute respiratory failure with hypoxia: Secondary | ICD-10-CM | POA: Diagnosis present

## 2019-10-11 DIAGNOSIS — Z888 Allergy status to other drugs, medicaments and biological substances status: Secondary | ICD-10-CM | POA: Diagnosis not present

## 2019-10-11 DIAGNOSIS — S82891A Other fracture of right lower leg, initial encounter for closed fracture: Secondary | ICD-10-CM | POA: Diagnosis present

## 2019-10-11 DIAGNOSIS — I4891 Unspecified atrial fibrillation: Secondary | ICD-10-CM

## 2019-10-11 DIAGNOSIS — K8020 Calculus of gallbladder without cholecystitis without obstruction: Secondary | ICD-10-CM

## 2019-10-11 DIAGNOSIS — Z6841 Body Mass Index (BMI) 40.0 and over, adult: Secondary | ICD-10-CM | POA: Diagnosis not present

## 2019-10-11 DIAGNOSIS — Z79890 Hormone replacement therapy (postmenopausal): Secondary | ICD-10-CM

## 2019-10-11 DIAGNOSIS — K7581 Nonalcoholic steatohepatitis (NASH): Secondary | ICD-10-CM | POA: Diagnosis present

## 2019-10-11 DIAGNOSIS — Z7901 Long term (current) use of anticoagulants: Secondary | ICD-10-CM

## 2019-10-11 DIAGNOSIS — Z803 Family history of malignant neoplasm of breast: Secondary | ICD-10-CM

## 2019-10-11 DIAGNOSIS — R0902 Hypoxemia: Secondary | ICD-10-CM

## 2019-10-11 DIAGNOSIS — I2510 Atherosclerotic heart disease of native coronary artery without angina pectoris: Secondary | ICD-10-CM | POA: Diagnosis present

## 2019-10-11 DIAGNOSIS — T502X5A Adverse effect of carbonic-anhydrase inhibitors, benzothiadiazides and other diuretics, initial encounter: Secondary | ICD-10-CM | POA: Diagnosis not present

## 2019-10-11 DIAGNOSIS — R0602 Shortness of breath: Secondary | ICD-10-CM

## 2019-10-11 DIAGNOSIS — Z79899 Other long term (current) drug therapy: Secondary | ICD-10-CM

## 2019-10-11 DIAGNOSIS — A419 Sepsis, unspecified organism: Secondary | ICD-10-CM

## 2019-10-11 DIAGNOSIS — N183 Chronic kidney disease, stage 3 unspecified: Secondary | ICD-10-CM | POA: Diagnosis present

## 2019-10-11 DIAGNOSIS — R0989 Other specified symptoms and signs involving the circulatory and respiratory systems: Secondary | ICD-10-CM | POA: Diagnosis not present

## 2019-10-11 DIAGNOSIS — Z8249 Family history of ischemic heart disease and other diseases of the circulatory system: Secondary | ICD-10-CM

## 2019-10-11 DIAGNOSIS — I4819 Other persistent atrial fibrillation: Secondary | ICD-10-CM | POA: Diagnosis not present

## 2019-10-11 LAB — COMPREHENSIVE METABOLIC PANEL
ALT: 113 U/L — ABNORMAL HIGH (ref 0–44)
AST: 243 U/L — ABNORMAL HIGH (ref 15–41)
Albumin: 3.4 g/dL — ABNORMAL LOW (ref 3.5–5.0)
Alkaline Phosphatase: 262 U/L — ABNORMAL HIGH (ref 38–126)
Anion gap: 12 (ref 5–15)
BUN: 19 mg/dL (ref 8–23)
CO2: 26 mmol/L (ref 22–32)
Calcium: 9.2 mg/dL (ref 8.9–10.3)
Chloride: 104 mmol/L (ref 98–111)
Creatinine, Ser: 1.3 mg/dL — ABNORMAL HIGH (ref 0.44–1.00)
GFR calc Af Amer: 49 mL/min — ABNORMAL LOW (ref >60)
GFR calc non Af Amer: 42 mL/min — ABNORMAL LOW (ref >60)
Glucose, Bld: 98 mg/dL (ref 70–99)
Potassium: 3.3 mmol/L — ABNORMAL LOW (ref 3.5–5.1)
Sodium: 142 mmol/L (ref 135–145)
Total Bilirubin: 3.1 mg/dL — ABNORMAL HIGH (ref 0.3–1.2)
Total Protein: 7.5 g/dL (ref 6.5–8.1)

## 2019-10-11 LAB — ECHOCARDIOGRAM COMPLETE
Height: 63 in
Weight: 3760 oz

## 2019-10-11 LAB — BLOOD CULTURE ID PANEL (REFLEXED)

## 2019-10-11 LAB — CBC WITH DIFFERENTIAL/PLATELET
Abs Immature Granulocytes: 0.01 10*3/uL (ref 0.00–0.07)
Basophils Absolute: 0 10*3/uL (ref 0.0–0.1)
Basophils Relative: 0 %
Eosinophils Absolute: 0 10*3/uL (ref 0.0–0.5)
Eosinophils Relative: 1 %
HCT: 33 % — ABNORMAL LOW (ref 36.0–46.0)
Hemoglobin: 10.2 g/dL — ABNORMAL LOW (ref 12.0–15.0)
Immature Granulocytes: 0 %
Lymphocytes Relative: 1 %
Lymphs Abs: 0.1 10*3/uL — ABNORMAL LOW (ref 0.7–4.0)
MCH: 29.1 pg (ref 26.0–34.0)
MCHC: 30.9 g/dL (ref 30.0–36.0)
MCV: 94.3 fL (ref 80.0–100.0)
Monocytes Absolute: 0.1 10*3/uL (ref 0.1–1.0)
Monocytes Relative: 1 %
Neutro Abs: 4.2 10*3/uL (ref 1.7–7.7)
Neutrophils Relative %: 97 %
Platelets: 284 10*3/uL (ref 150–400)
RBC: 3.5 MIL/uL — ABNORMAL LOW (ref 3.87–5.11)
RDW: 15.6 % — ABNORMAL HIGH (ref 11.5–15.5)
WBC: 4.3 10*3/uL (ref 4.0–10.5)
nRBC: 0 % (ref 0.0–0.2)

## 2019-10-11 LAB — URINE CULTURE

## 2019-10-11 LAB — LACTIC ACID, PLASMA
Lactic Acid, Venous: 2.2 mmol/L (ref 0.5–1.9)
Lactic Acid, Venous: 2.4 mmol/L (ref 0.5–1.9)
Lactic Acid, Venous: 1.2 mmol/L (ref 0.5–1.9)

## 2019-10-11 LAB — URINALYSIS, ROUTINE W REFLEX MICROSCOPIC
Bilirubin Urine: NEGATIVE
Glucose, UA: NEGATIVE mg/dL
Hgb urine dipstick: NEGATIVE
Ketones, ur: NEGATIVE mg/dL
Leukocytes,Ua: NEGATIVE
Nitrite: NEGATIVE
Protein, ur: NEGATIVE mg/dL
Specific Gravity, Urine: 1.008 (ref 1.005–1.030)
pH: 6 (ref 5.0–8.0)

## 2019-10-11 LAB — TSH: TSH: 0.53 u[IU]/mL (ref 0.350–4.500)

## 2019-10-11 LAB — BRAIN NATRIURETIC PEPTIDE: B Natriuretic Peptide: 1800.8 pg/mL — ABNORMAL HIGH (ref 0.0–100.0)

## 2019-10-11 LAB — HEPATITIS PANEL, ACUTE
HCV Ab: NONREACTIVE
Hep A IgM: NONREACTIVE
Hep B C IgM: NONREACTIVE
Hepatitis B Surface Ag: NONREACTIVE

## 2019-10-11 LAB — SARS CORONAVIRUS 2 BY RT PCR (HOSPITAL ORDER, PERFORMED IN ~~LOC~~ HOSPITAL LAB): SARS Coronavirus 2: NEGATIVE

## 2019-10-11 LAB — MAGNESIUM: Magnesium: 1.3 mg/dL — ABNORMAL LOW (ref 1.7–2.4)

## 2019-10-11 LAB — PROTIME-INR
INR: 1.5 — ABNORMAL HIGH (ref 0.8–1.2)
Prothrombin Time: 17.6 seconds — ABNORMAL HIGH (ref 11.4–15.2)

## 2019-10-11 LAB — PROCALCITONIN: Procalcitonin: 6.08 ng/mL

## 2019-10-11 MED ORDER — DOCUSATE SODIUM 100 MG PO CAPS
100.0000 mg | ORAL_CAPSULE | Freq: Two times a day (BID) | ORAL | Status: DC
  Administered 2019-10-11 – 2019-10-12 (×2): 100 mg via ORAL
  Filled 2019-10-11 (×2): qty 1

## 2019-10-11 MED ORDER — ENOXAPARIN SODIUM 40 MG/0.4ML ~~LOC~~ SOLN
40.0000 mg | SUBCUTANEOUS | Status: DC
Start: 2019-10-11 — End: 2019-10-11

## 2019-10-11 MED ORDER — BUMETANIDE 0.25 MG/ML IJ SOLN
1.0000 mg | Freq: Once | INTRAMUSCULAR | Status: AC
  Administered 2019-10-11: 1 mg via INTRAVENOUS
  Filled 2019-10-11 (×2): qty 4

## 2019-10-11 MED ORDER — VANCOMYCIN HCL 750 MG/150ML IV SOLN
750.0000 mg | Freq: Once | INTRAVENOUS | Status: AC
  Administered 2019-10-11: 750 mg via INTRAVENOUS
  Filled 2019-10-11: qty 150

## 2019-10-11 MED ORDER — HYDRALAZINE HCL 20 MG/ML IJ SOLN: 5.0000 mg | INTRAMUSCULAR | Status: DC | PRN

## 2019-10-11 MED ORDER — MAGNESIUM SULFATE 2 GM/50ML IV SOLN
2.0000 g | Freq: Once | INTRAVENOUS | Status: AC
  Administered 2019-10-11: 2 g via INTRAVENOUS
  Filled 2019-10-11: qty 50

## 2019-10-11 MED ORDER — ZOLPIDEM TARTRATE 5 MG PO TABS
5.0000 mg | ORAL_TABLET | Freq: Every evening | ORAL | Status: DC | PRN
  Administered 2019-10-11 – 2019-10-14 (×4): 5 mg via ORAL
  Filled 2019-10-11 (×4): qty 1

## 2019-10-11 MED ORDER — LEVOTHYROXINE SODIUM 100 MCG PO TABS
100.0000 ug | ORAL_TABLET | Freq: Every day | ORAL | Status: DC
  Administered 2019-10-11 – 2019-10-15 (×5): 100 ug via ORAL
  Filled 2019-10-11 (×3): qty 1
  Filled 2019-10-11: qty 2
  Filled 2019-10-11: qty 1

## 2019-10-11 MED ORDER — SODIUM CHLORIDE 0.9 % IV SOLN
2.0000 g | Freq: Once | INTRAVENOUS | Status: AC
  Administered 2019-10-11: 2 g via INTRAVENOUS
  Filled 2019-10-11: qty 2

## 2019-10-11 MED ORDER — OXYCODONE HCL 5 MG PO TABS
5.0000 mg | ORAL_TABLET | ORAL | Status: DC | PRN
  Administered 2019-10-11 – 2019-10-12 (×3): 10 mg via ORAL
  Administered 2019-10-12: 5 mg via ORAL
  Administered 2019-10-13 – 2019-10-15 (×10): 10 mg via ORAL
  Filled 2019-10-11: qty 1
  Filled 2019-10-11 (×3): qty 2
  Filled 2019-10-11 (×3): qty 1
  Filled 2019-10-11 (×5): qty 2
  Filled 2019-10-11: qty 1
  Filled 2019-10-11 (×3): qty 2

## 2019-10-11 MED ORDER — ONDANSETRON HCL 4 MG/2ML IJ SOLN: 4.0000 mg | Freq: Four times a day (QID) | INTRAMUSCULAR | Status: DC | PRN

## 2019-10-11 MED ORDER — AMIODARONE HCL 200 MG PO TABS
200.0000 mg | ORAL_TABLET | Freq: Every day | ORAL | Status: DC
  Filled 2019-10-11: qty 1

## 2019-10-11 MED ORDER — CARVEDILOL 6.25 MG PO TABS: 12.5000 mg | ORAL_TABLET | Freq: Two times a day (BID) | ORAL | Status: DC

## 2019-10-11 MED ORDER — SODIUM CHLORIDE 0.9% FLUSH
3.0000 mL | INTRAVENOUS | Status: DC | PRN
  Administered 2019-10-15: 3 mL via INTRAVENOUS

## 2019-10-11 MED ORDER — PANTOPRAZOLE SODIUM 40 MG PO TBEC
40.0000 mg | DELAYED_RELEASE_TABLET | Freq: Every day | ORAL | Status: DC
  Administered 2019-10-11 – 2019-10-15 (×5): 40 mg via ORAL
  Filled 2019-10-11 (×5): qty 1

## 2019-10-11 MED ORDER — SODIUM CHLORIDE 0.9% FLUSH
3.0000 mL | Freq: Two times a day (BID) | INTRAVENOUS | Status: DC
  Administered 2019-10-11 – 2019-10-14 (×8): 3 mL via INTRAVENOUS

## 2019-10-11 MED ORDER — APIXABAN 5 MG PO TABS
5.0000 mg | ORAL_TABLET | Freq: Two times a day (BID) | ORAL | Status: DC
  Administered 2019-10-11 – 2019-10-15 (×9): 5 mg via ORAL
  Filled 2019-10-11 (×9): qty 1

## 2019-10-11 MED ORDER — ALPRAZOLAM 0.5 MG PO TABS: 0.2500 mg | ORAL_TABLET | Freq: Two times a day (BID) | ORAL | Status: DC | PRN

## 2019-10-11 MED ORDER — ROSUVASTATIN CALCIUM 10 MG PO TABS
10.0000 mg | ORAL_TABLET | Freq: Every day | ORAL | Status: DC
  Administered 2019-10-11 – 2019-10-14 (×4): 10 mg via ORAL
  Filled 2019-10-11 (×5): qty 1

## 2019-10-11 MED ORDER — SODIUM CHLORIDE 0.9 % IV SOLN
250.0000 mL | INTRAVENOUS | Status: DC | PRN
  Administered 2019-10-11: 250 mL via INTRAVENOUS
  Administered 2019-10-14: 10 mL via INTRAVENOUS

## 2019-10-11 MED ORDER — ACETAMINOPHEN 325 MG PO TABS: 650.0000 mg | ORAL_TABLET | ORAL | Status: DC | PRN

## 2019-10-11 MED ORDER — DM-GUAIFENESIN ER 30-600 MG PO TB12: 1.0000 | ORAL_TABLET | Freq: Two times a day (BID) | ORAL | Status: DC | PRN

## 2019-10-11 MED ORDER — VANCOMYCIN HCL IN DEXTROSE 1-5 GM/200ML-% IV SOLN
1000.0000 mg | Freq: Once | INTRAVENOUS | Status: AC
  Administered 2019-10-11: 1000 mg via INTRAVENOUS
  Filled 2019-10-11: qty 200

## 2019-10-11 MED ORDER — ALBUTEROL SULFATE (2.5 MG/3ML) 0.083% IN NEBU: 2.5000 mg | INHALATION_SOLUTION | RESPIRATORY_TRACT | Status: DC | PRN

## 2019-10-11 MED ORDER — SODIUM CHLORIDE 0.9 % IV SOLN
1.0000 g | Freq: Two times a day (BID) | INTRAVENOUS | Status: DC
  Administered 2019-10-11 – 2019-10-13 (×5): 1 g via INTRAVENOUS
  Filled 2019-10-11 (×6): qty 1

## 2019-10-11 MED ORDER — POTASSIUM CHLORIDE CRYS ER 20 MEQ PO TBCR
40.0000 meq | EXTENDED_RELEASE_TABLET | Freq: Once | ORAL | Status: AC
  Administered 2019-10-11: 40 meq via ORAL
  Filled 2019-10-11: qty 2

## 2019-10-11 MED ORDER — POTASSIUM CHLORIDE CRYS ER 20 MEQ PO TBCR
40.0000 meq | EXTENDED_RELEASE_TABLET | ORAL | Status: DC
  Administered 2019-10-11: 40 meq via ORAL
  Filled 2019-10-11: qty 2

## 2019-10-11 MED ORDER — PERFLUTREN LIPID MICROSPHERE
1.0000 mL | INTRAVENOUS | Status: AC | PRN
  Administered 2019-10-11: 2 mL via INTRAVENOUS
  Filled 2019-10-11: qty 10

## 2019-10-11 MED ORDER — DICLOFENAC SODIUM 1 % EX GEL
1.0000 "application " | Freq: Four times a day (QID) | CUTANEOUS | Status: DC | PRN
  Filled 2019-10-11: qty 100

## 2019-10-11 MED ORDER — FUROSEMIDE 10 MG/ML IJ SOLN: 40.0000 mg | Freq: Every day | INTRAMUSCULAR | Status: DC

## 2019-10-11 MED ORDER — VENLAFAXINE HCL 37.5 MG PO TABS: 75.0000 mg | ORAL_TABLET | Freq: Two times a day (BID) | ORAL | Status: DC

## 2019-10-11 MED ORDER — ZOLPIDEM TARTRATE 5 MG PO TABS: 5.0000 mg | ORAL_TABLET | Freq: Every evening | ORAL | Status: DC | PRN

## 2019-10-11 MED ORDER — SACUBITRIL-VALSARTAN 49-51 MG PO TABS
1.0000 | ORAL_TABLET | Freq: Two times a day (BID) | ORAL | Status: DC
  Administered 2019-10-12 – 2019-10-15 (×7): 1 via ORAL
  Filled 2019-10-11 (×7): qty 1

## 2019-10-11 NOTE — Progress Notes (Signed)
cpap declined 

## 2019-10-11 NOTE — ED Notes (Signed)
Lab called to collect lactic as pt is difficult stick and IV won't draw back. Dietary called for pt's soup, states it is coming, dietary is running late.

## 2019-10-11 NOTE — H&P (Signed)
History and Physical    Shelva Hetzer JSE:[id number] DOB: [birth year] DOA: 10/11/2019  Referring MD/NP/PA:   PCP: Marguarite Arbour, MD   Patient coming from:  The patient is coming from home.  At baseline, pt is independent for most of ADL.        Chief Complaint: SOB  HPI: Bhavya Eschete is a 69 y.o. female with medical history significant of hypertension, hyperlipidemia, GERD, hypothyroidism, anxiety, PAF on Eliquis, OSA on CPAP, CAD, drug-induced torsade de pointes, morbid obesity, dCHF, who presents with SOB.  Patient was recently hospitalized from 6/7-6/10 due to right ankle fracture.  Patient is s/p of ORIF on 10/01/2019.  Patient states that her surgical site has been healing well.  She developed shortness of breath since yesterday, which has been progressively worsening.  Patient denies cough, fever or chills.  No chest pain.  Patient does not have nausea, vomiting, diarrhea, abdominal pain, symptoms of UTI or unilateral weakness.  Patient states that her oral Lasix has been on hold unil yesterday due to worsening renal function recently.  ED Course: pt was found to have BNP 18 00, lactic acid 2.2, INR 1.5, negative urinalysis, negative COVID-19 PCR, potassium 3.3, stable renal function, abnormal liver function (ALP 262, AST 243, A LT 113, total bilirubin 3.1). Temperature 99, blood pressure 111/58, heart rate 59, oxygen saturation 86%, which improved to 94 to 96% on 3 L nasal cannula oxygen, tachypnea.  Chest x-ray showed cardiomegaly and interstitial pulmonary edema.  Patient is admitted to progressive bed as inpatient.  # US-RUQ: Cholelithiasis without evidence of acute cholecystitis. Hepatic steatosis. Small right pleural effusion  Review of Systems:   General: no fevers, chills, no body weight gain, has fatigue HEENT: no blurry vision, hearing changes or sore throat Respiratory: has dyspnea, no coughing, wheezing CV: no chest pain, no palpitations GI: no nausea, vomiting,  abdominal pain, diarrhea, constipation GU: no dysuria, burning on urination, increased urinary frequency, hematuria  Ext: has trace leg edema Neuro: no unilateral weakness, numbness, or tingling, no vision change or hearing loss Skin: no rash, no skin tear. MSK: s/p of right ankle surgery Heme: No easy bruising.  Travel history: No recent long distant travel.  Allergy:  Allergies  Allergen Reactions  . Levaquin [Levofloxacin In D5w] Other (See Comments)    Severe dizziness   . Metoprolol Itching, Nausea And Vomiting, Rash and Other (See Comments)    Dizziness  . Tikosyn [Dofetilide] Other (See Comments)    Developed Torsades on Tikosyn 09/28/17  . Ace Inhibitors Cough    Past Medical History:  Diagnosis Date  . Anxiety   . Arthritis    "knees" (09/26/2017)  . Chronic diastolic CHF (congestive heart failure) (HCC)    a. Dx 08/2013 in setting of rapid afib;  b. 08/2013 Echo: EF 55-60%, mildly dil LA, nl RV.  Marland Kitchen Drug-induced torsades de pointes 10/06/2017  . GERD (gastroesophageal reflux disease)   . High cholesterol   . History of blood transfusion 1975   "related to ectopic pregnancy  . Hypertension   . Non-obstructive CAD    a. 10/2013 Myoview: anteroseptal, apical, septal mild ischemia, nl LV fxn;  b. Cath: LAD 47m LCX 20ost, RCA 29m.  . Obesity   . OSA on CPAP   . Paroxysmal atrial fibrillation (HCC)    a. Dx 08/2013. CHA2DS2VASc = 3 (diast chf, htn, female) -->Xarelto initiated; b. 09/2013 s/p DCCV;  c. 10/2013 recurrent afib->Flecainide added->S/P DCCV;  d. 11/2013 Recurrent afib while off of  flecainide (2/2 abnl nuc study)-->Flecainide resumed after nl cath-->DCCV;  e. Recurrent Afib 01/2014-->Flecainide increased to 150mg  BID.  Marland Kitchen Pneumonia    "1-2 times" (09/26/2017)    Past Surgical History:  Procedure Laterality Date  . BREAST BIOPSY Right 2007   stereo-neg  . CARDIAC CATHETERIZATION  10/2013   [facility]  . CARDIOVERSION N/A 08/29/2017   Procedure: CARDIOVERSION;  Surgeon: Iran Ouch, MD;  Location: [facility] ORS;  Service: Cardiovascular;  Laterality: N/A;  . CARDIOVERSION     "I've had a total of 4" (09/26/2017)  . CARDIOVERSION N/A 12/08/2018   Procedure: CARDIOVERSION;  Surgeon: Antonieta Iba, MD;  Location: [facility] ORS;  Service: Cardiovascular;  Laterality: N/A;  . ECTOPIC PREGNANCY SURGERY  1975  . ORIF ANKLE FRACTURE Right 10/01/2019   Procedure: OPEN REDUCTION INTERNAL FIXATION (ORIF) ANKLE FRACTURE;  Surgeon: Christena Flake, MD;  Location: [facility] ORS;  Service: Orthopedics;  Laterality: Right;  . TONSILLECTOMY      Social History:  reports that she has never smoked. She has never used smokeless tobacco. She reports current alcohol use of about 7.0 standard drinks of alcohol per week. She reports that she does not use drugs.  Family History:  Family History  Problem Relation Age of Onset  . CAD Father        s/p cabg - ? h/o afib.  Marland Kitchen Heart attack Father   . Hypertension Mother   . Breast cancer Mother 27     Prior to Admission medications   Medication Sig Start Date End Date Taking? Authorizing Provider  acetaminophen (TYLENOL) 325 MG tablet Take 1-2 tablets (325-650 mg total) by mouth every 6 (six) hours as needed for mild pain (pain score 1-3 or temp > 100.5). Patient taking differently: Take 325 mg by mouth every 6 (six) hours as needed for mild pain (pain score 1-3 or temp > 100.5).  10/04/19  Yes Darlin Priestly, MD  acetaminophen (TYLENOL) 325 MG tablet Take 650 mg by mouth every 8 (eight) hours.   Yes [provider]  albuterol (ACCUNEB) 0.63 MG/3ML nebulizer solution Take 1 ampule by nebulization every 4 (four) hours as needed for wheezing.   Yes [provider]  amiodarone (PACERONE) 200 MG tablet TAKE 1 TABLET(200 MG) BY MOUTH DAILY 01/02/19  Yes Duke Salvia, MD  apixaban (ELIQUIS) 5 MG TABS tablet Take 1 tablet (5 mg total) by mouth 2 (two) times daily. 05/18/19  Yes Duke Salvia, MD  carvedilol (COREG) 12.5 MG tablet TAKE 1  TABLET(12.5 MG) BY MOUTH TWICE DAILY WITH A MEAL 04/10/19  Yes Duke Salvia, MD  cefTRIAXone (ROCEPHIN) 1 g injection Inject 1 g into the muscle once.   Yes [provider]  diclofenac sodium (VOLTAREN) 1 % GEL Apply 1 application topically 3 (three) times daily as needed (knee pain).   Yes [provider]  docusate sodium (COLACE) 100 MG capsule Take 1 capsule (100 mg total) by mouth 2 (two) times daily. 10/04/19  Yes Darlin Priestly, MD  esomeprazole (NEXIUM) 40 MG capsule TAKE 1 CAPSULE BY MOUTH EVERY MORNING Patient taking differently: Take 40 mg by mouth daily.  12/15/17  Yes Duke Salvia, MD  ipratropium-albuterol (DUONEB) 0.5-2.5 (3) MG/3ML SOLN Take 3 mLs by nebulization every 6 (six) hours as needed.   Yes [provider]  levothyroxine (SYNTHROID) 100 MCG tablet Take 100 mcg by mouth daily.  10/02/18  Yes [provider]  ondansetron (ZOFRAN) 4 MG tablet Take 4 mg  by mouth 2 (two) times daily as needed for nausea or vomiting.   Yes [provider]  oxyCODONE (OXY IR/ROXICODONE) 5 MG immediate release tablet Take 1-2 tablets (5-10 mg total) by mouth every 4 (four) hours as needed for moderate pain. Patient taking differently: Take 5-10 mg by mouth every 4 (four) hours as needed for moderate pain or severe pain.  10/02/19  Yes Anson Oregon, PA-C  rosuvastatin (CRESTOR) 10 MG tablet Take 10 mg by mouth at bedtime. 09/29/19  Yes [provider]  sacubitril-valsartan (ENTRESTO) 49-51 MG Take 1 tablet by mouth 2 (two) times daily.   Yes [provider]  zolpidem (AMBIEN) 10 MG tablet Take 1 tablet (10 mg total) by mouth at bedtime as needed for sleep. Patient taking differently: Take 10 mg by mouth at bedtime.  10/04/19  Yes Darlin Priestly, MD  furosemide (LASIX) 40 MG tablet Hold until PCP followup due to AKI. Patient not taking: Reported on 10/11/2019 10/04/19   Darlin Priestly, MD    Physical Exam: Vitals:   10/11/19 1435 10/11/19 1535  10/11/19 1635 10/11/19 1735  BP:      Pulse: 63 (!) 57 (!) 52 (!) 56  Resp:  19 16   Temp:      TempSrc:      SpO2: 99% 99% 97% 97%  Weight:      Height:       General: Not in acute distress HEENT:       Eyes: PERRL, EOMI, no scleral icterus.       ENT: No discharge from the ears and nose, no pharynx injection, no tonsillar enlargement.        Neck: No JVD, no bruit, no mass felt. Heme: No neck lymph node enlargement. Cardiac: S1/S2, RRR, No murmurs, No gallops or rubs. Respiratory: No rales, wheezing, rhonchi or rubs. GI: Soft, nondistended, nontender, no rebound pain, no organomegaly, BS present. GU: No hematuria Ext: has trace leg edema bilaterally. 1+DP/PT pulse bilaterally. Musculoskeletal: s/p of right ankle surgery. Right ankle is wrapped up without any draining Skin: No rashes.  Neuro: Alert, oriented X3, cranial nerves II-XII grossly intact, moves all extremities normally. Psych: Patient is not psychotic, no suicidal or hemocidal ideation.  Labs on Admission: I have personally reviewed following labs and imaging studies  CBC: Recent Labs  Lab 10/11/19 0026  WBC 4.3  NEUTROABS 4.2  HGB 10.2*  HCT 33.0*  MCV 94.3  PLT 284   Basic Metabolic Panel: Recent Labs  Lab 10/11/19 0026 10/11/19 0318  NA 142  --   K 3.3*  --   CL 104  --   CO2 26  --   GLUCOSE 98  --   BUN 19  --   CREATININE 1.30*  --   CALCIUM 9.2  --   MG  --  1.3*   GFR: Estimated Creatinine Clearance: 49.1 mL/min (A) (by C-G formula based on SCr of 1.3 mg/dL (H)). Liver Function Tests: Recent Labs  Lab 10/11/19 0026  AST 243*  ALT 113*  ALKPHOS 262*  BILITOT 3.1*  PROT 7.5  ALBUMIN 3.4*   No results for input(s): LIPASE, AMYLASE in the last 168 hours. No results for input(s): AMMONIA in the last 168 hours. Coagulation Profile: Recent Labs  Lab 10/11/19 0026  INR 1.5*   Cardiac Enzymes: No results for input(s): CKTOTAL, CKMB, CKMBINDEX, TROPONINI in the last 168 hours. BNP  (last 3 results) No results for input(s): PROBNP in the last 8760 hours.  HbA1C: No results for input(s): HGBA1C in the last 72 hours. CBG: No results for input(s): GLUCAP in the last 168 hours. Lipid Profile: No results for input(s): CHOL, HDL, LDLCALC, TRIG, CHOLHDL, LDLDIRECT in the last 72 hours. Thyroid Function Tests: Recent Labs    10/11/19 0318  TSH 0.530   Anemia Panel: No results for input(s): VITAMINB12, FOLATE, FERRITIN, TIBC, IRON, RETICCTPCT in the last 72 hours. Urine analysis:    Component Value Date/Time   COLORURINE YELLOW (A) 10/11/2019 0026   APPEARANCEUR CLOUDY (A) 10/11/2019 0026   LABSPEC 1.008 10/11/2019 0026   PHURINE 6.0 10/11/2019 0026   GLUCOSEU NEGATIVE 10/11/2019 0026   HGBUR NEGATIVE 10/11/2019 0026   BILIRUBINUR NEGATIVE 10/11/2019 0026   KETONESUR NEGATIVE 10/11/2019 0026   PROTEINUR NEGATIVE 10/11/2019 0026   NITRITE NEGATIVE 10/11/2019 0026   LEUKOCYTESUR NEGATIVE 10/11/2019 0026   Sepsis Labs: (procalcitonin:4,lacticidven:4) ) Recent Results (from the past 240 hour(s))  SARS Coronavirus 2 by RT PCR (hospital order, performed in [facility] hospital lab) Nasopharyngeal Nasopharyngeal Swab     Status: None   Collection Time: 10/03/19  3:12 PM   Specimen: Nasopharyngeal Swab  Result Value Ref Range Status   SARS Coronavirus 2 NEGATIVE NEGATIVE Final    Comment: (NOTE) SARS-CoV-2 target nucleic acids are NOT DETECTED. The SARS-CoV-2 RNA is generally detectable in upper and lower respiratory specimens during the acute phase of infection. The lowest concentration of SARS-CoV-2 viral copies this assay can detect is 250 copies / mL. A negative result does not preclude SARS-CoV-2 infection and should not be used as the sole basis for treatment or other patient management decisions.  A negative result may occur with improper specimen collection / handling, submission of specimen other than nasopharyngeal swab, presence of viral  mutation(s) within the areas targeted by this assay, and inadequate number of viral copies (<250 copies / mL). A negative result must be combined with clinical observations, patient history, and epidemiological information. Fact Sheet for Patients:   BoilerBrush.com.cy Fact Sheet for Healthcare Providers: https://pope.com/ This test is not yet approved or cleared  by the [place] FDA and has been authorized for detection and/or diagnosis of SARS-CoV-2 by FDA under an Emergency Use Authorization (EUA).  This EUA will remain in effect (meaning this test can be used) for the duration of the COVID-19 declaration under Section 564(b)(1) of the Act, 21 U.S.C. section 360bbb-3(b)(1), unless the authorization is terminated or revoked sooner. Performed at [facility], [address] [place], [place] [address]   Blood Culture (routine x 2)     Status: None (Preliminary result)   Collection Time: 10/11/19 12:26 AM   Specimen: BLOOD LEFT HAND  Result Value Ref Range Status   Specimen Description BLOOD LEFT HAND  Final   Special Requests   Final    BOTTLES DRAWN AEROBIC AND ANAEROBIC Blood Culture adequate volume   Culture  Setup Time   Final    GRAM NEGATIVE RODS IN BOTH AEROBIC AND ANAEROBIC BOTTLES CRITICAL RESULT CALLED TO, READ BACK BY AND VERIFIED WITH: KAREN HAYES AT 1156 ON 10/11/2019 [facility]. Performed at [facility], [address] [place], [place] [address]    Culture GRAM NEGATIVE RODS  Final   Report Status PENDING  Incomplete  Blood Culture (routine x 2)     Status: None (Preliminary result)   Collection Time: 10/11/19 12:26 AM   Specimen: Left Antecubital; Blood  Result Value Ref Range Status   Specimen Description LEFT ANTECUBITAL  Final  Special Requests   Final    BOTTLES DRAWN AEROBIC AND ANAEROBIC Blood Culture adequate volume   Culture  Setup Time   Final    Organism ID to follow GRAM NEGATIVE  RODS IN BOTH AEROBIC AND ANAEROBIC BOTTLES CRITICAL RESULT CALLED TO, READ BACK BY AND VERIFIED WITH: KAREN HAYES AT 8144 ON 10/11/2019 [place]. Performed at [facility], [address] [place], [place] [address]    Culture GRAM NEGATIVE RODS  Final   Report Status PENDING  Incomplete  SARS Coronavirus 2 by RT PCR (hospital order, performed in [place] hospital lab) Nasopharyngeal     Status: None   Collection Time: 10/11/19 12:26 AM   Specimen: Nasopharyngeal  Result Value Ref Range Status   SARS Coronavirus 2 NEGATIVE NEGATIVE Final    Comment: (NOTE) SARS-CoV-2 target nucleic acids are NOT DETECTED.  The SARS-CoV-2 RNA is generally detectable in upper and lower respiratory specimens during the acute phase of infection. The lowest concentration of SARS-CoV-2 viral copies this assay can detect is 250 copies / mL. A negative result does not preclude SARS-CoV-2 infection and should not be used as the sole basis for treatment or other patient management decisions.  A negative result may occur with improper specimen collection / handling, submission of specimen other than nasopharyngeal swab, presence of viral mutation(s) within the areas targeted by this assay, and inadequate number of viral copies (<250 copies / mL). A negative result must be combined with clinical observations, patient history, and epidemiological information.  Fact Sheet for Patients:   StrictlyIdeas.no  Fact Sheet for Healthcare Providers: BankingDealers.co.za  This test is not yet approved or  cleared by the [place] FDA and has been authorized for detection and/or diagnosis of SARS-CoV-2 by FDA under an Emergency Use Authorization (EUA).  This EUA will remain in effect (meaning this test can be used) for the duration of the COVID-19 declaration under Section 564(b)(1) of the Act, 21 U.S.C. section 360bbb-3(b)(1), unless the authorization is  terminated or revoked sooner.  Performed at [facility], [address] [place], [place] [address]   Blood Culture ID Panel (Reflexed)     Status: Abnormal   Collection Time: 10/11/19 12:26 AM  Result Value Ref Range Status   Enterococcus species NOT DETECTED NOT DETECTED Final   Listeria monocytogenes NOT DETECTED NOT DETECTED Final   Staphylococcus species NOT DETECTED NOT DETECTED Final   Staphylococcus aureus (BCID) NOT DETECTED NOT DETECTED Final   Streptococcus species NOT DETECTED NOT DETECTED Final   Streptococcus agalactiae NOT DETECTED NOT DETECTED Final   Streptococcus pneumoniae NOT DETECTED NOT DETECTED Final   Streptococcus pyogenes NOT DETECTED NOT DETECTED Final   Acinetobacter baumannii NOT DETECTED NOT DETECTED Final   Enterobacteriaceae species DETECTED (A) NOT DETECTED Final    Comment: Enterobacteriaceae represent a large family of gram-negative bacteria, not a single organism. CRITICAL RESULT CALLED TO, READ BACK BY AND VERIFIED WITH: KAREN HAYES AT 0263 ON 10/11/2019 [place].    Enterobacter cloacae complex NOT DETECTED NOT DETECTED Final   Escherichia coli DETECTED (A) NOT DETECTED Final    Comment: CRITICAL RESULT CALLED TO, READ BACK BY AND VERIFIED WITH: KAREN HAYES AT 1156 ON 10/11/2019 [place].    Klebsiella oxytoca NOT DETECTED NOT DETECTED Final   Klebsiella pneumoniae NOT DETECTED NOT DETECTED Final   Proteus species NOT DETECTED NOT DETECTED Final   Serratia marcescens NOT DETECTED NOT DETECTED Final   Carbapenem resistance NOT DETECTED NOT DETECTED Final   Haemophilus  influenzae NOT DETECTED NOT DETECTED Final   Neisseria meningitidis NOT DETECTED NOT DETECTED Final   Pseudomonas aeruginosa NOT DETECTED NOT DETECTED Final   Candida albicans NOT DETECTED NOT DETECTED Final   Candida glabrata NOT DETECTED NOT DETECTED Final   Candida krusei NOT DETECTED NOT DETECTED Final   Candida parapsilosis NOT DETECTED NOT DETECTED Final   Candida  tropicalis NOT DETECTED NOT DETECTED Final    Comment: Performed at [facility], [address], [place], [place] [address]     Radiological Exams on Admission: DG Chest Port 1 View  Result Date: 10/11/2019 CLINICAL DATA:  Shortness of breath EXAM: PORTABLE CHEST 1 VIEW COMPARISON:  October 01, 2019 FINDINGS: There is mild cardiomegaly. Prominence of the central pulmonary vasculature is seen. There is mildly increased interstitial markings seen throughout both lungs. No large airspace consolidation or pleural effusion. No acute osseous abnormality. IMPRESSION: Cardiomegaly and interstitial edema. Electronically Signed   By: Jonna Clark M.D.   On: 10/11/2019 00:38   ECHOCARDIOGRAM COMPLETE  Result Date: 10/11/2019    ECHOCARDIOGRAM REPORT   Patient Name:   RADIAH LUBINSKI Date of Exam: 10/11/2019 Medical Rec #:  [id number]       Height:       63.0 in Accession #:    [id number]      Weight:       235.0 lb Date of Birth:  04-Mar-1951        BSA:          2.071 m Patient Age:    69 years        BP:           120/61 mmHg Patient Gender: F               HR:           60 bpm. Exam Location:  [facility] Procedure: 2D Echo, Color Doppler, Cardiac Doppler and Intracardiac            Opacification Agent Indications:     I50.31 CHF-Acute Diastolic  History:         Patient has prior history of Echocardiogram examinations.                  Non-obstructive CAD, Arrythmias:Paroxysmal afib; Risk                  Factors:Sleep Apnea, Hypertension and HCL.  Sonographer:     Humphrey Rolls RDCS (AE) Referring Phys:  [id number] Vernetta Honey MANSY Diagnosing Phys: Julien Nordmann MD  Sonographer Comments: Technically difficult study due to poor echo windows and no parasternal window. Image acquisition challenging due to patient body habitus. IMPRESSIONS  1. Left ventricular ejection fraction, by estimation, is 55 to 60%. The left ventricle has normal function. The left ventricle has no regional wall motion abnormalities. Left ventricular  diastolic parameters were normal.  2. Right ventricular systolic function is normal. The right ventricular size is normal. Tricuspid regurgitation signal is inadequate for assessing PA pressure.  3. Challenging image quality FINDINGS  Left Ventricle: Left ventricular ejection fraction, by estimation, is 55 to 60%. The left ventricle has normal function. The left ventricle has no regional wall motion abnormalities. Definity contrast agent was given IV to delineate the left ventricular  endocardial borders. The left ventricular internal cavity size was normal in size. There is no left ventricular hypertrophy. Left ventricular diastolic parameters were normal. Right Ventricle: The right ventricular size is normal. No increase in right ventricular wall  thickness. Right ventricular systolic function is normal. Tricuspid regurgitation signal is inadequate for assessing PA pressure. Left Atrium: Left atrial size was normal in size. Right Atrium: Right atrial size was normal in size. Pericardium: There is no evidence of pericardial effusion. Mitral Valve: The mitral valve is normal in structure. Normal mobility of the mitral valve leaflets. No evidence of mitral valve regurgitation. No evidence of mitral valve stenosis. MV peak gradient, 5.8 mmHg. The mean mitral valve gradient is 2.0 mmHg. Tricuspid Valve: The tricuspid valve is normal in structure. Tricuspid valve regurgitation is not demonstrated. No evidence of tricuspid stenosis. Aortic Valve: The aortic valve is normal in structure. Aortic valve regurgitation is not visualized. No aortic stenosis is present. Aortic valve mean gradient measures 9.0 mmHg. Aortic valve peak gradient measures 18.1 mmHg. Pulmonic Valve: The pulmonic valve was normal in structure. Pulmonic valve regurgitation is not visualized. No evidence of pulmonic stenosis. Aorta: The aortic root is normal in size and structure. Venous: The inferior vena cava is normal in size with greater than 50%  respiratory variability, suggesting right atrial pressure of 3 mmHg. IAS/Shunts: No atrial level shunt detected by color flow Doppler.   LV Volumes (MOD) LV vol d, MOD A4C: 128.0 ml Diastology LV vol s, MOD A4C: 64.2 ml  LV e' lateral:   7.83 cm/s LV SV MOD A4C:     128.0 ml LV E/e' lateral: 13.9                             LV e' medial:    8.27 cm/s                             LV E/e' medial:  13.2  AORTIC VALVE AV Vmax:           213.00 cm/s AV Vmean:          132.000 cm/s AV VTI:            0.414 m AV Peak Grad:      18.1 mmHg AV Mean Grad:      9.0 mmHg LVOT Vmax:         120.00 cm/s LVOT Vmean:        76.600 cm/s LVOT VTI:          0.253 m LVOT/AV VTI ratio: 0.61 MITRAL VALVE MV Area (PHT): 2.16 cm     SHUNTS MV Peak grad:  5.8 mmHg     Systemic VTI: 0.25 m MV Mean grad:  2.0 mmHg MV Vmax:       1.20 m/s MV Vmean:      60.3 cm/s MV Decel Time: 352 msec MV E velocity: 109.00 cm/s MV A velocity: 63.50 cm/s MV E/A ratio:  1.72 Julien Nordmann MD Electronically signed by Julien Nordmann MD Signature Date/Time: 10/11/2019/4:52:56 PM    Final    US ABDOMEN LIMITED RUQ  Result Date: 10/11/2019 CLINICAL DATA:  Elevated LFTs EXAM: ULTRASOUND ABDOMEN LIMITED RIGHT UPPER QUADRANT COMPARISON:  None. FINDINGS: Gallbladder: Layering calcified gallstones are present the largest measuring 1.8 cm. No gallbladder wall thickening. No sonographic Murphy sign noted by sonographer. Common bile duct: Diameter: 4.6 mm Liver: Increased echotexture seen throughout. No focal abnormality or biliary ductal dilatation. Portal vein is patent on color Doppler imaging with normal direction of blood flow towards the liver. Other: A small right pleural effusion is present. IMPRESSION: Cholelithiasis without evidence  of acute cholecystitis. Hepatic steatosis Small right pleural effusion Electronically Signed   By: Jonna Clark M.D.   On: 10/11/2019 02:47     EKG: Independently reviewed.  Sinus rhythm, QTC 608, low voltage, nonspecific T  wave change  Assessment/Plan Principal Problem:   Acute on chronic diastolic CHF (congestive heart failure) (HCC) Active Problems:   GERD (gastroesophageal reflux disease)   Atrial fibrillation (HCC)   Coronary artery disease   Essential hypertension   Hypothyroidism   CKD (chronic kidney disease), stage IIIa   HLD (hyperlipidemia)   Acute respiratory failure with hypoxia (HCC)   Abnormal LFTs   Hypokalemia   Hypomagnesemia   Elevated lactic acid level   Bacteremia due to Gram-negative bacteria  Acute respiratory failure with hypoxia due to acute on chronic diastolic CHF (congestive heart failure) (HCC): Patient has elevated BNP 1800 and chest x-ray showed interstitial pulmonary edema, clinically consistent with CHF exacerbation.  Patient received 1 mg of Bumex in the ED.   -Will admit to progressive unit as inpatient -Lasix 40 mg daily by IV (pt has bacteremia, at high risk of developing sepsis, will not give aggressive diuretics). -2d echo -Daily weights -strict I/O's -Low salt diet -Fluid restriction -Obtain REDs Vest reading  GERD (gastroesophageal reflux disease) -Protonix  Atrial fibrillation [facility]): Patient has bradycardia with heart rate 50s -Hold Coreg and amiodarone -Continue Eliquis  Coronary artery disease -Continue Crestor  Essential hypertension -Patient is on IV Lasix -Continue Entresto  Hypothyroidism -Synthroid  CKD (chronic kidney disease), stage IIIa: Stable -Follow-up with CBC  HLD (hyperlipidemia) -Crestor  Abnormal LFTs: US-RUQ showed cholelithiasis without evidence of acute cholecystitis. No nausea vomiting, abdominal pain. -Check hepatitis panel, HIV antibody -Avoid using Tylenol  Hypokalemia and Hypomagnesemia -Repleted both  Elevated lactic acid level: Lactic acid 2.2 --> 2.4.  Patient has bacteremia with 4/4 bottles E coli, but no fever or leukocytosis.  Does not meet criteria for sepsis.-We will trend lactic acid level -Will  not give IV fluid due to CHF exacerbation  Bacteremia due to Gram-negative bacteria: Source of infection is not clear.  Urinalysis negative.  Chest x-ray negative for infiltration. -Meropenem IV (patient received 1 dose of vancomycin and cefepime by ED) -Follow-up of blood culture and urine culture  S/of right ankle surgery: -pain control: Continue home as needed oxycodone        DVT ppx: on Eliquis Code Status: Full code Family Communication: not done, no family member is at bed side.   Disposition Plan:  Anticipate discharge back to previous environment Consults called:  none Admission status:   progressive unit  as inpt     Status is: Inpatient  Remains inpatient appropriate because:Inpatient level of care appropriate due to severity of illness.  Patient has multiple comorbidities, now presents with CHF exacerbation.  Patient has acute respiratory failure with new requirement of oxygen. Patient also has bacteremia, elevated lactic acid level, hypokalemia, hypomagnesemia, abnormal liver function.  Her presentation is highly complicated.  She is at high risk for deterioration. Patient will need to be treated in hospital for at least 2 days.   Dispo: The patient is from: Home              Anticipated d/c is to: Home              Anticipated d/c date is: 2 days              Patient currently is not medically stable to d/c.  Date of Service 10/11/2019    Lorretta Harp [facility]   If 7PM-7AM, please contact night-coverage [email or web address] 10/11/2019, 5:40 PM

## 2019-10-11 NOTE — Progress Notes (Signed)
*  PRELIMINARY RESULTS* Echocardiogram 2D Echocardiogram has been performed.  Stacey [name] 10/11/2019, 12:57 PM

## 2019-10-11 NOTE — ED Notes (Addendum)
Pt's soup not arrived, dietary called x2, dietary to send. Pt given sandwich tray and juice.

## 2019-10-11 NOTE — Progress Notes (Signed)
PHARMACY -  BRIEF ANTIBIOTIC NOTE   Pharmacy has received consult(s) for vancomycin/cefepime  from an ED provider.  The patient's profile has been reviewed for ht/wt/allergies/indication/available labs.    One time order(s) placed for vancomycin 1.75g IV load and cefepime 2g IV x 1  Further antibiotics/pharmacy consults should be ordered by admitting physician if indicated.                       Thank you,  Thomasene Ripple, PharmD, BCPS Clinical Pharmacist 10/11/2019  1:58 AM

## 2019-10-11 NOTE — ED Notes (Signed)
Pt resting comfortably with eyes closed, will continue monitor.

## 2019-10-11 NOTE — ED Notes (Signed)
Report attempted to 2 C, pt's nurse not available, secretary states RN will call back when available

## 2019-10-11 NOTE — ED Notes (Signed)
Updated pt's daughter in law, Noreene Larsson, with pt's permission

## 2019-10-11 NOTE — ED Notes (Signed)
Pt given soda and cracker and ice chips.

## 2019-10-11 NOTE — ED Notes (Signed)
US at bedside

## 2019-10-11 NOTE — ED Notes (Signed)
Pt states she has never used oxygen at home. Pt 100% on 3 L [place], O2 reduced to 2 Lnc, pt remains at 100% on 2 Lnc

## 2019-10-11 NOTE — ED Triage Notes (Signed)
Pt in with acute onset of shob that started today. Denies any cough or fever. Pt did have ankle repair surgery Monday  to right ankle due to fx. Pt has hx of chf, no co pain.

## 2019-10-11 NOTE — Progress Notes (Signed)
PHARMACY - PHYSICIAN COMMUNICATION CRITICAL VALUE ALERT - BLOOD CULTURE IDENTIFICATION (BCID)  Stacey [name] is an 69 y.o. female who presented to [facility] on 10/11/2019  Assessment:  4/4 bottles E.coli  Name of physician (or Provider) Contacted: Dr. Clyde Lundborg  Current antibiotics: none  Changes to prescribed antibiotics recommended: meropenem pending sensitivities  Results for orders placed or performed during the hospital encounter of 10/11/19  Blood Culture ID Panel (Reflexed) (Collected: 10/11/2019 12:26 AM)  Result Value Ref Range   Enterococcus species NOT DETECTED NOT DETECTED   Listeria monocytogenes NOT DETECTED NOT DETECTED   Staphylococcus species NOT DETECTED NOT DETECTED   Staphylococcus aureus (BCID) NOT DETECTED NOT DETECTED   Streptococcus species NOT DETECTED NOT DETECTED   Streptococcus agalactiae NOT DETECTED NOT DETECTED   Streptococcus pneumoniae NOT DETECTED NOT DETECTED   Streptococcus pyogenes NOT DETECTED NOT DETECTED   Acinetobacter baumannii NOT DETECTED NOT DETECTED   Enterobacteriaceae species DETECTED (A) NOT DETECTED   Enterobacter cloacae complex NOT DETECTED NOT DETECTED   Escherichia coli DETECTED (A) NOT DETECTED   Klebsiella oxytoca NOT DETECTED NOT DETECTED   Klebsiella pneumoniae NOT DETECTED NOT DETECTED   Proteus species NOT DETECTED NOT DETECTED   Serratia marcescens NOT DETECTED NOT DETECTED   Carbapenem resistance NOT DETECTED NOT DETECTED   Haemophilus influenzae NOT DETECTED NOT DETECTED   Neisseria meningitidis NOT DETECTED NOT DETECTED   Pseudomonas aeruginosa NOT DETECTED NOT DETECTED   Candida albicans NOT DETECTED NOT DETECTED   Candida glabrata NOT DETECTED NOT DETECTED   Candida krusei NOT DETECTED NOT DETECTED   Candida parapsilosis NOT DETECTED NOT DETECTED   Candida tropicalis NOT DETECTED NOT DETECTED    Pricilla Riffle, PharmD 10/11/2019  12:17 PM

## 2019-10-11 NOTE — ED Provider Notes (Signed)
[facility] Emergency Department Provider Note   ____________________________________________   First MD Initiated Contact with Patient 10/11/19 0010     (approximate)  I have reviewed the triage vital signs and the nursing notes.   HISTORY  Chief Complaint Shortness of breath   HPI Stacey [name] is a 69 y.o. female brought to the ED via EMS from [facility] with a chief complaint of shortness of breath.  Patient had right ankle surgery on 10/01/2019.  History of CHF, hypertension, PAF on Eliquis.  Does not wear oxygen at baseline.  EMS reports saturations of 90% on 4 L nasal cannula oxygen.  Patient reports some wheezing and cough; history of pneumonia.  Denies fever, chest pain, abdominal pain, nausea, vomiting or dizziness.    Past Medical History:  Diagnosis Date  . Anxiety   . Arthritis    "knees" (09/26/2017)  . Chronic diastolic CHF (congestive heart failure) ([place])    a. Dx 08/2013 in setting of rapid afib;  b. 08/2013 Echo: EF 55-60%, mildly dil LA, nl RV.  Marland Kitchen Drug-induced torsades de pointes 10/06/2017  . GERD (gastroesophageal reflux disease)   . High cholesterol   . History of blood transfusion 1975   "related to ectopic pregnancy  . Hypertension   . Non-obstructive CAD    a. 10/2013 Myoview: anteroseptal, apical, septal mild ischemia, nl LV fxn;  b. Cath: LAD 45m LCX 20ost, RCA 70m.  . Obesity   . OSA on CPAP   . Paroxysmal atrial fibrillation ([place])    a. Dx 08/2013. CHA2DS2VASc = 3 (diast chf, htn, female) -->Xarelto initiated; b. 09/2013 s/p DCCV;  c. 10/2013 recurrent afib->Flecainide added->S/P DCCV;  d. 11/2013 Recurrent afib while off of flecainide (2/2 abnl nuc study)-->Flecainide resumed after nl cath-->DCCV;  e. Recurrent Afib 01/2014-->Flecainide increased to 150mg  BID.  Marland Kitchen Pneumonia    "1-2 times" (09/26/2017)    Patient Active Problem List   Diagnosis Date Noted  . Acute CHF (congestive heart failure) ([place]) 10/11/2019  .  Hypothyroidism 10/01/2019  . Normocytic anemia 10/01/2019  . CKD (chronic kidney disease), stage IIIa 10/01/2019  . Closed fracture of right distal fibula 10/01/2019  . Morbid obesity with BMI of 40.0-44.9, adult ([place]) 10/01/2019  . Preop cardiovascular exam   . Drug-induced torsades de pointes 10/06/2017  . Persistent atrial fibrillation ([place]) 09/26/2017  . Essential hypertension 09/16/2014  . Sleep apnea 12/20/2013  . Coronary artery disease   . Chronic diastolic CHF (congestive heart failure) ([place])   . Obesity   . GERD (gastroesophageal reflux disease)   . Hypertensive cardiovascular disease   . Depression   . Presumed Obstructive Sleep Apnea   . Atrial fibrillation [facility])     Past Surgical History:  Procedure Laterality Date  . BREAST BIOPSY Right 2007   stereo-neg  . CARDIAC CATHETERIZATION  10/2013   [facility]  . CARDIOVERSION N/A 08/29/2017   Procedure: CARDIOVERSION;  Surgeon: Wellington Hampshire, MD;  Location: [facility] ORS;  Service: Cardiovascular;  Laterality: N/A;  . CARDIOVERSION     "I've had a total of 4" (09/26/2017)  . CARDIOVERSION N/A 12/08/2018   Procedure: CARDIOVERSION;  Surgeon: Minna Merritts, MD;  Location: [facility] ORS;  Service: Cardiovascular;  Laterality: N/A;  . [place]  . ORIF ANKLE FRACTURE Right 10/01/2019   Procedure: OPEN REDUCTION INTERNAL FIXATION (ORIF) ANKLE FRACTURE;  Surgeon: Corky Mull, MD;  Location: [facility] ORS;  Service: Orthopedics;  Laterality: Right;  . TONSILLECTOMY  Prior to Admission medications   Medication Sig Start Date End Date Taking? Authorizing Provider  acetaminophen (TYLENOL) 325 MG tablet Take 1-2 tablets (325-650 mg total) by mouth every 6 (six) hours as needed for mild pain (pain score 1-3 or temp > 100.5). 10/04/19   Darlin Priestly, MD  amiodarone (PACERONE) 200 MG tablet TAKE 1 TABLET(200 MG) BY MOUTH DAILY 01/02/19   Duke Salvia, MD  apixaban (ELIQUIS) 5 MG TABS tablet Take 1 tablet (5 mg total) by mouth 2  (two) times daily. 05/18/19   Duke Salvia, MD  carvedilol (COREG) 12.5 MG tablet TAKE 1 TABLET(12.5 MG) BY MOUTH TWICE DAILY WITH A MEAL 04/10/19   Duke Salvia, MD  diclofenac sodium (VOLTAREN) 1 % GEL Apply 1 application topically 3 (three) times daily as needed (knee pain).    [provider]  docusate sodium (COLACE) 100 MG capsule Take 1 capsule (100 mg total) by mouth 2 (two) times daily. 10/04/19   Darlin Priestly, MD  esomeprazole (NEXIUM) 40 MG capsule TAKE 1 CAPSULE BY MOUTH EVERY MORNING Patient taking differently: Take 40 mg by mouth daily.  12/15/17   Duke Salvia, MD  furosemide (LASIX) 40 MG tablet Hold until PCP followup due to AKI. 10/04/19   Darlin Priestly, MD  levothyroxine (SYNTHROID) 100 MCG tablet Take 100 mcg by mouth daily.  10/02/18   [provider]  oxyCODONE (OXY IR/ROXICODONE) 5 MG immediate release tablet Take 1-2 tablets (5-10 mg total) by mouth every 4 (four) hours as needed for moderate pain. 10/02/19   Anson Oregon, PA-C  rosuvastatin (CRESTOR) 10 MG tablet Take 10 mg by mouth at bedtime. 09/29/19   [provider]  sacubitril-valsartan (ENTRESTO) 49-51 MG Take 1 tablet by mouth 2 (two) times daily.    [provider]  venlafaxine (EFFEXOR) 75 MG tablet Take 75 mg by mouth 2 (two) times daily.    [provider]  zolpidem (AMBIEN) 10 MG tablet Take 1 tablet (10 mg total) by mouth at bedtime as needed for sleep. 10/04/19   Darlin Priestly, MD    Allergies Levaquin [levofloxacin in d5w], Metoprolol, Tikosyn [dofetilide], and Ace inhibitors  Family History  Problem Relation Age of Onset  . CAD Father        s/p cabg - ? h/o afib.  Marland Kitchen Heart attack Father   . Hypertension Mother   . Breast cancer Mother 44    Social History Social History   Tobacco Use  . Smoking status: Never Smoker  . Smokeless tobacco: Never Used  Vaping Use  . Vaping Use: Never used  Substance Use Topics  . Alcohol use: Yes    Alcohol/week: 7.0  standard drinks    Types: 7 Glasses of wine per week    Comment: 1 glass of wine/day.  . Drug use: Never    Review of Systems  Constitutional: No fever/chills Eyes: No visual changes. ENT: No sore throat. Cardiovascular: Denies chest pain. Respiratory: Positive for shortness of breath. Gastrointestinal: No abdominal pain.  No nausea, no vomiting.  No diarrhea.  No constipation. Genitourinary: Negative for dysuria. Musculoskeletal: Negative for back pain. Skin: Negative for rash. Neurological: Negative for headaches, focal weakness or numbness.   ____________________________________________   PHYSICAL EXAM:  VITAL SIGNS: ED Triage Vitals  Enc Vitals Group     BP      Pulse      Resp      Temp      Temp src  SpO2      Weight      Height      Head Circumference      Peak Flow      Pain Score      Pain Loc      Pain Edu?      Excl. in GC?     Constitutional: Alert and oriented.  Uncomfortable appearing and in mild acute distress. Eyes: Conjunctivae are normal. PERRL. EOMI. Head: Atraumatic. Nose: No congestion/rhinnorhea. Mouth/Throat: Mucous membranes are moist.  Neck: No stridor.   Cardiovascular: Normal rate, regular rhythm. Grossly normal heart sounds.  Good peripheral circulation. Respiratory: Increased respiratory effort.  No retractions. Lungs diminished bibasilarly. Gastrointestinal: Soft and nontender to light or deep palpation. No distention. No abdominal bruits. No CVA tenderness. Musculoskeletal: No lower extremity tenderness nor edema on the left.  Right lower leg in splint.  No joint effusions. Neurologic:  Normal speech and language. No gross focal neurologic deficits are appreciated.  Skin:  Skin is warm, dry and intact. No rash noted. Psychiatric: Mood and affect are normal. Speech and behavior are normal.  ____________________________________________   LABS (all labs ordered are listed, but only abnormal results are displayed)  Labs  Reviewed  LACTIC ACID, PLASMA - Abnormal; Notable for the following components:      Result Value   Lactic Acid, Venous 2.2 (*)    All other components within normal limits  COMPREHENSIVE METABOLIC PANEL - Abnormal; Notable for the following components:   Potassium 3.3 (*)    Creatinine, Ser 1.30 (*)    Albumin 3.4 (*)    AST 243 (*)    ALT 113 (*)    Alkaline Phosphatase 262 (*)    Total Bilirubin 3.1 (*)    GFR calc non Af Amer 42 (*)    GFR calc Af Amer 49 (*)    All other components within normal limits  CBC WITH DIFFERENTIAL/PLATELET - Abnormal; Notable for the following components:   RBC 3.50 (*)    Hemoglobin 10.2 (*)    HCT 33.0 (*)    RDW 15.6 (*)    Lymphs Abs 0.1 (*)    All other components within normal limits  PROTIME-INR - Abnormal; Notable for the following components:   Prothrombin Time 17.6 (*)    INR 1.5 (*)    All other components within normal limits  URINALYSIS, ROUTINE W REFLEX MICROSCOPIC - Abnormal; Notable for the following components:   Color, Urine YELLOW (*)    APPearance CLOUDY (*)    All other components within normal limits  BRAIN NATRIURETIC PEPTIDE - Abnormal; Notable for the following components:   B Natriuretic Peptide 1,800.8 (*)    All other components within normal limits  SARS CORONAVIRUS 2 BY RT PCR (HOSPITAL ORDER, PERFORMED IN [facility])  CULTURE, BLOOD (ROUTINE X 2)  CULTURE, BLOOD (ROUTINE X 2)  URINE CULTURE  PROCALCITONIN  LACTIC ACID, PLASMA   ____________________________________________  EKG  ED ECG REPORT I, Emmet Messer J, the attending physician, personally viewed and interpreted this ECG.   Date: 10/11/2019  EKG Time: 0011  Rate: 73  Rhythm: normal EKG, normal sinus rhythm  Axis: Normal  Intervals:none  ST&T Change: Nonspecific  ____________________________________________  RADIOLOGY  ED MD interpretation: Cardiomegaly and interstitial edema  Official radiology report(s): DG Chest Port 1  View  Result Date: 10/11/2019 CLINICAL DATA:  Shortness of breath EXAM: PORTABLE CHEST 1 VIEW COMPARISON:  October 01, 2019 FINDINGS: There is mild cardiomegaly. Prominence  of the central pulmonary vasculature is seen. There is mildly increased interstitial markings seen throughout both lungs. No large airspace consolidation or pleural effusion. No acute osseous abnormality. IMPRESSION: Cardiomegaly and interstitial edema. Electronically Signed   By: Jonna Clark M.D.   On: 10/11/2019 00:38   US ABDOMEN LIMITED RUQ  Result Date: 10/11/2019 CLINICAL DATA:  Elevated LFTs EXAM: ULTRASOUND ABDOMEN LIMITED RIGHT UPPER QUADRANT COMPARISON:  None. FINDINGS: Gallbladder: Layering calcified gallstones are present the largest measuring 1.8 cm. No gallbladder wall thickening. No sonographic Murphy sign noted by sonographer. Common bile duct: Diameter: 4.6 mm Liver: Increased echotexture seen throughout. No focal abnormality or biliary ductal dilatation. Portal vein is patent on color Doppler imaging with normal direction of blood flow towards the liver. Other: A small right pleural effusion is present. IMPRESSION: Cholelithiasis without evidence of acute cholecystitis. Hepatic steatosis Small right pleural effusion Electronically Signed   By: Jonna Clark M.D.   On: 10/11/2019 02:47    ____________________________________________   PROCEDURES  Procedure(s) performed (including Critical Care):  .1-3 Lead EKG Interpretation Performed by: Irean Hong, MD Authorized by: Irean Hong, MD     Interpretation: normal     ECG rate:  70   ECG rate assessment: normal     Rhythm: sinus rhythm     Ectopy: none     Conduction: normal   Comments:     Patient placed on cardiac monitor to evaluate for arrhythmias   CRITICAL CARE Performed by: Irean Hong   Total critical care time: 45 minutes  Critical care time was exclusive of separately billable procedures and treating other patients.  Critical care was  necessary to treat or prevent imminent or life-threatening deterioration.  Critical care was time spent personally by me on the following activities: development of treatment plan with patient and/or surrogate as well as nursing, discussions with consultants, evaluation of patient's response to treatment, examination of patient, obtaining history from patient or surrogate, ordering and performing treatments and interventions, ordering and review of laboratory studies, ordering and review of radiographic studies, pulse oximetry and re-evaluation of patient's condition.   ____________________________________________   INITIAL IMPRESSION / ASSESSMENT AND PLAN / ED COURSE  As part of my medical decision making, I reviewed the following data within the electronic MEDICAL RECORD NUMBER Nursing notes reviewed and incorporated, Labs reviewed, EKG interpreted, Old chart reviewed, Radiograph reviewed, Discussed with admitting physician and Notes from prior ED visits     Stacey [name] was evaluated in Emergency Department on 10/11/2019 for the symptoms described in the history of present illness. She was evaluated in the context of the global COVID-19 pandemic, which necessitated consideration that the patient might be at risk for infection with the SARS-CoV-2 virus that causes COVID-19. Institutional protocols and algorithms that pertain to the evaluation of patients at risk for COVID-19 are in a state of rapid change based on information released by regulatory bodies including the CDC and federal and state organizations. These policies and algorithms were followed during the patient's care in the ED.    69 year old female status post ankle surgery 10 days ago presenting with shortness of breath. Differential includes, but is not limited to, viral syndrome, bronchitis including COPD exacerbation, pneumonia, reactive airway disease including asthma, CHF including exacerbation with or without pulmonary/interstitial  edema, pneumothorax, ACS, thoracic trauma, and pulmonary embolism.  Will obtain sepsis work-up, chest x-ray.  On 2 L nasal cannula oxygen patient is 93%.  Consider CTA chest to evaluate for PE if  chest x-ray is clear.   Clinical Course as of Oct 11 255  Thu Oct 11, 2019  0046 I personally reviewed patient's records and see that her Lasix 40 mg daily was held while she was in the hospital recently due to AKI.  There is no evidence of fluid overload on her chest x-ray.  Her QTC tonight is prolonged at 603.  Rather than Lasix, which would further prolong her QTC, I will order 1 mg IV Bumex for diuresis.   [JS]  0046 Noted elevation of liver enzymes.  Will obtain abdominal ultrasound.  Will discuss with hospitalist services for admission.   [JS]  0152 Lactic acid elevated.  Code sepsis called and broad-spectrum IV antibiotics initiated.  Held Flagyl as it may prolong patient's QTC.   [JS]  0256 US demonstrates cholelithiasis without acute cholecystitis, right pleural effusion.   [JS]    Clinical Course User Index [JS] Irean Hong, MD     ____________________________________________   FINAL CLINICAL IMPRESSION(S) / ED DIAGNOSES  Final diagnoses:  SOB (shortness of breath)  Hypoxia  Acute on chronic congestive heart failure, unspecified heart failure type (HCC)  Sepsis, due to unspecified organism, unspecified whether acute organ dysfunction present (HCC)  Elevated LFTs  Calculus of gallbladder without cholecystitis without obstruction     ED Discharge Orders    None       Note:  This document was prepared using Dragon voice recognition software and may include unintentional dictation errors.   Irean Hong, MD 10/11/19 [phone]

## 2019-10-11 NOTE — Progress Notes (Signed)
CODE SEPSIS - PHARMACY COMMUNICATION  **Broad Spectrum Antibiotics should be administered within 1 hour of Sepsis diagnosis**  Time Code Sepsis Called/Page Received: 0148  Antibiotics Ordered: cefepime/vanc  Time of 1st antibiotic administration: 0229  Additional action taken by pharmacy:   If necessary, Name of Provider/Nurse Contacted:     Thomasene Ripple ,PharmD Clinical Pharmacist  10/11/2019  2:56 AM

## 2019-10-11 NOTE — Progress Notes (Signed)
?  Code Sepsis note: BNP elevated and has hx of CHF, no IVF bolus administered. Bumex administered instead of Lasix due to prolonged QTc. US shows pl effusion and pt was wheezing. ABX administered. LA was 2.2.   Ji Fairburn DNP Elink RN 04:50 AM

## 2019-10-11 NOTE — ED Notes (Signed)
Pt resting comfortably awaiting disposition, husband at bedside.

## 2019-10-12 DIAGNOSIS — B9620 Unspecified Escherichia coli [E. coli] as the cause of diseases classified elsewhere: Secondary | ICD-10-CM

## 2019-10-12 DIAGNOSIS — R7881 Bacteremia: Secondary | ICD-10-CM

## 2019-10-12 DIAGNOSIS — R0989 Other specified symptoms and signs involving the circulatory and respiratory systems: Secondary | ICD-10-CM

## 2019-10-12 DIAGNOSIS — I509 Heart failure, unspecified: Secondary | ICD-10-CM

## 2019-10-12 LAB — BASIC METABOLIC PANEL
Anion gap: 9 (ref 5–15)
BUN: 36 mg/dL — ABNORMAL HIGH (ref 8–23)
CO2: 24 mmol/L (ref 22–32)
Calcium: 8.2 mg/dL — ABNORMAL LOW (ref 8.9–10.3)
Chloride: 106 mmol/L (ref 98–111)
Creatinine, Ser: 1.91 mg/dL — ABNORMAL HIGH (ref 0.44–1.00)
GFR calc Af Amer: 31 mL/min — ABNORMAL LOW (ref >60)
GFR calc non Af Amer: 27 mL/min — ABNORMAL LOW (ref >60)
Glucose, Bld: 90 mg/dL (ref 70–99)
Potassium: 4.9 mmol/L (ref 3.5–5.1)
Sodium: 139 mmol/L (ref 135–145)

## 2019-10-12 LAB — GLUCOSE, CAPILLARY
Glucose-Capillary: 102 mg/dL — ABNORMAL HIGH (ref 70–99)
Glucose-Capillary: 101 mg/dL — ABNORMAL HIGH (ref 70–99)

## 2019-10-12 LAB — MAGNESIUM: Magnesium: 2.1 mg/dL (ref 1.7–2.4)

## 2019-10-12 MED ORDER — SENNOSIDES-DOCUSATE SODIUM 8.6-50 MG PO TABS
2.0000 | ORAL_TABLET | Freq: Two times a day (BID) | ORAL | Status: DC
  Administered 2019-10-12: 2 via ORAL
  Filled 2019-10-12 (×7): qty 2

## 2019-10-12 MED ORDER — FUROSEMIDE 40 MG PO TABS
40.0000 mg | ORAL_TABLET | Freq: Two times a day (BID) | ORAL | Status: DC
  Administered 2019-10-12 – 2019-10-15 (×7): 40 mg via ORAL
  Filled 2019-10-12 (×7): qty 1

## 2019-10-12 NOTE — Consult Note (Signed)
NAME: Stacey [name]  DOB: Sep 24, 1950  MRN: [id number]  Date/Time: 10/12/2019 2:38 PM  REQUESTING PROVIDER: [email or web address] Subjective:  REASON FOR CONSULT: E.coli bacteremia ? Eller Sweis is a 69 y.o. female with a history of hypertension, A. fib, hyperlipidemia, GERD, obstructive sleep apnea, CAD, drug-induced torsade the point is, morbid obesity, diastolic CHF, recent right ankle fracture status post ORIF on 10/01/2019 presents from [facility] with sudden onset chills, nausea and feeling unwell followed by SOB.   Vitals showed a temperature of 99, BP of 166/67, pulse ox of 86%, HR of 73. Labs revealed a WBC of 4.3, hemoglobin of 10.2, platelet of 284.  Creatinine was 1.3   and lactate was 2.2 and BNP was 1800 and procalcitonin was 6.08. Blood cultures had 4 out of 4 gram-negative rods.  It was E. coli  Patient on 10/01/2019 presented to the ED with right knee giving out and causing her to fall and hurt her right ankle.  A closed unstable displaced distal fibular fracture of the right ankle was noted on x-ray and she was taken for open reduction and internal fixation of the displaced distal fibular fracture on 10/01/2019 by Dr. Joice Lofts.  On 10/02/2019 she received bilateral knees Orthovisc injection. She was discharged to SNF on 10/04/2019.  Past Medical History:  Diagnosis Date  . Anxiety   . Arthritis    "knees" (09/26/2017)  . Chronic diastolic CHF (congestive heart failure) (HCC)    a. Dx 08/2013 in setting of rapid afib;  b. 08/2013 Echo: EF 55-60%, mildly dil LA, nl RV.  Marland Kitchen Drug-induced torsades de pointes 10/06/2017  . GERD (gastroesophageal reflux disease)   . High cholesterol   . History of blood transfusion 1975   "related to ectopic pregnancy  . Hypertension   . Non-obstructive CAD    a. 10/2013 Myoview: anteroseptal, apical, septal mild ischemia, nl LV fxn;  b. Cath: LAD 19m LCX 20ost, RCA 71m.  . Obesity   . OSA on CPAP   . Paroxysmal atrial fibrillation (HCC)    a. Dx 08/2013. CHA2DS2VASc =  3 (diast chf, htn, female) -->Xarelto initiated; b. 09/2013 s/p DCCV;  c. 10/2013 recurrent afib->Flecainide added->S/P DCCV;  d. 11/2013 Recurrent afib while off of flecainide (2/2 abnl nuc study)-->Flecainide resumed after nl cath-->DCCV;  e. Recurrent Afib 01/2014-->Flecainide increased to 150mg  BID.  Pneumonia    "1-2 times" (09/26/2017)    Past Surgical History:  Procedure Laterality Date  . BREAST BIOPSY Right 2007   stereo-neg  . CARDIAC CATHETERIZATION  10/2013   [facility]  . CARDIOVERSION N/A 08/29/2017   Procedure: CARDIOVERSION;  Surgeon: 10/29/2017, MD;  Location: [facility] ORS;  Service: Cardiovascular;  Laterality: N/A;  . CARDIOVERSION     "I've had a total of 4" (09/26/2017)  . CARDIOVERSION N/A 12/08/2018   Procedure: CARDIOVERSION;  Surgeon: 12/10/2018, MD;  Location: [facility] ORS;  Service: Cardiovascular;  Laterality: N/A;  . ECTOPIC PREGNANCY SURGERY  1975  . ORIF ANKLE FRACTURE Right 10/01/2019   Procedure: OPEN REDUCTION INTERNAL FIXATION (ORIF) ANKLE FRACTURE;  Surgeon: 12/01/2019, MD;  Location: [facility] ORS;  Service: Orthopedics;  Laterality: Right;  . TONSILLECTOMY      Social History   Socioeconomic History  . Marital status: Married    Spouse name: Not on file  . Number of children: Not on file  . Years of education: Not on file  . Highest education level: Not on file  Occupational History  . Not on file  Tobacco Use  . Smoking status: Never Smoker  . Smokeless tobacco: Never Used  Vaping Use  . Vaping Use: Never used  Substance and Sexual Activity  . Alcohol use: Yes    Alcohol/week: 7.0 standard drinks    Types: 7 Glasses of wine per week    Comment: 1 glass of wine/day.  . Drug use: Never  . Sexual activity: Not Currently  Other Topics Concern  . Not on file  Social History Narrative   Lives in [place].  Works as Water quality scientist.    Social Determinants of Health   Financial Resource Strain:   . Difficulty of Paying Living Expenses:   Food  Insecurity:   . Worried About Programme researcher, broadcasting/film/video in the Last Year:   . Barista in the Last Year:   Transportation Needs:   . Freight forwarder (Medical):   Marland Kitchen Lack of Transportation (Non-Medical):   Physical Activity:   . Days of Exercise per Week:   . Minutes of Exercise per Session:   Stress:   . Feeling of Stress :   Social Connections:   . Frequency of Communication with Friends and Family:   . Frequency of Social Gatherings with Friends and Family:   . Attends Religious Services:   . Active Member of Clubs or Organizations:   . Attends Banker Meetings:   Marland Kitchen Marital Status:   Intimate Partner Violence:   . Fear of Current or Ex-Partner:   . Emotionally Abused:   Marland Kitchen Physically Abused:   . Sexually Abused:     Family History  Problem Relation Age of Onset  . CAD Father        s/p cabg - ? h/o afib.  Marland Kitchen Heart attack Father   . Hypertension Mother   . Breast cancer Mother 75   Allergies  Allergen Reactions  . Levaquin [Levofloxacin In D5w] Other (See Comments)    Severe dizziness   . Metoprolol Itching, Nausea And Vomiting, Rash and Other (See Comments)    Dizziness  . Tikosyn [Dofetilide] Other (See Comments)    Developed Torsades on Tikosyn 09/28/17  . Ace Inhibitors Cough   ? Current Facility-Administered Medications  Medication Dose Route Frequency Provider Last Rate Last Admin  . 0.9 %  sodium chloride infusion  250 mL Intravenous PRN Mansy, Vernetta Honey, MD   Stopped at 10/11/19 2326  . albuterol (PROVENTIL) (2.5 MG/3ML) 0.083% nebulizer solution 2.5 mg  2.5 mg Nebulization Q4H PRN Lorretta Harp, MD      . apixaban Everlene Balls) tablet 5 mg  5 mg Oral BID Mansy, Jan A, MD   5 mg at 10/12/19 0819  . dextromethorphan-guaiFENesin (MUCINEX DM) 30-600 MG per 12 hr tablet 1 tablet  1 tablet Oral BID PRN Lorretta Harp, MD      . diclofenac Sodium (VOLTAREN) 1 % topical gel 1 application  1 application Topical QID PRN Mansy, Jan A, MD      . furosemide (LASIX)  tablet 40 mg  40 mg Oral BID Marrion Coy, MD   40 mg at 10/12/19 0944  . hydrALAZINE (APRESOLINE) injection 5 mg  5 mg Intravenous Q2H PRN Lorretta Harp, MD      . levothyroxine (SYNTHROID) tablet 100 mcg  100 mcg Oral Daily Mansy, Jan A, MD   100 mcg at 10/12/19 1610  . meropenem (MERREM) 1 g in sodium chloride 0.9 % 100 mL IVPB  1 g Intravenous Q12H Lorretta Harp, MD  200 mL/hr at 10/12/19 0825 1 g at 10/12/19 0825  . oxyCODONE (Oxy IR/ROXICODONE) immediate release tablet 5-10 mg  5-10 mg Oral Q4H PRN Mansy, Jan A, MD   5 mg at 10/12/19 0616  . pantoprazole (PROTONIX) EC tablet 40 mg  40 mg Oral Daily Mansy, Jan A, MD   40 mg at 10/12/19 0819  . rosuvastatin (CRESTOR) tablet 10 mg  10 mg Oral QHS Mansy, Jan A, MD   10 mg at 10/11/19 2231  . sacubitril-valsartan (ENTRESTO) 49-51 mg per tablet  1 tablet Oral BID Mansy, Vernetta Honey, MD   1 tablet at 10/12/19 0944  . senna-docusate (Senokot-S) tablet 2 tablet  2 tablet Oral BID Marrion Coy, MD   2 tablet at 10/12/19 0945  . sodium chloride flush (NS) 0.9 % injection 3 mL  3 mL Intravenous Q12H Mansy, Jan A, MD   3 mL at 10/12/19 2458  . sodium chloride flush (NS) 0.9 % injection 3 mL  3 mL Intravenous PRN Mansy, Jan A, MD      . zolpidem (AMBIEN) tablet 5 mg  5 mg Oral QHS PRN Mansy, Jan A, MD   5 mg at 10/11/19 2232     Abtx:  Anti-infectives (From admission, onward)   Start     Dose/Rate Route Frequency Ordered Stop   10/11/19 1300  meropenem (MERREM) 1 g in sodium chloride 0.9 % 100 mL IVPB     Discontinue     1 g 200 mL/hr over 30 Minutes Intravenous Every 12 hours 10/11/19 1214     10/11/19 0200  ceFEPIme (MAXIPIME) 2 g in sodium chloride 0.9 % 100 mL IVPB        2 g 200 mL/hr over 30 Minutes Intravenous  Once 10/11/19 0146 10/11/19 0315   10/11/19 0200  vancomycin (VANCOCIN) IVPB 1000 mg/200 mL premix        1,000 mg 200 mL/hr over 60 Minutes Intravenous  Once 10/11/19 0146 10/11/19 0424   10/11/19 0200  vancomycin (VANCOREADY) IVPB 750 mg/150 mL         750 mg 150 mL/hr over 60 Minutes Intravenous  Once 10/11/19 0157 10/11/19 0541      REVIEW OF SYSTEMS:  Const: negative fever, ++ chills, negative weight loss Eyes: negative diplopia or visual changes, negative eye pain ENT: negative coryza, negative sore throat Resp: negative cough, hemoptysis, has  dyspnea Cards: negative for chest pain, palpitations, lower extremity edema GU: negative for frequency, dysuria and hematuria GI: Negative for abdominal pain, diarrhea, bleeding, constipation Skin: negative for rash and pruritus Heme: negative for easy bruising and gum/nose bleeding MS: generalized weakness Neurolo:negative for headaches, dizziness, vertigo, memory problems  Psych: negative for feelings of anxiety, depression  Endocrine: negative for thyroid, diabetes Allergy/Immunology- as above Objective:  VITALS:  BP 129/61 (BP Location: Right Arm)   Pulse 63   Temp 98.5 F (36.9 C)   Resp 18   Ht 5\' 3"  (1.6 m)   Wt 114.2 kg   SpO2 96%   BMI 44.60 kg/m  PHYSICAL EXAM:  General: Alert, cooperative, no distress, appears stated age. Nasal cannula- increased BMI Head: Normocephalic, without obvious abnormality, atraumatic. Eyes: Conjunctivae clear, anicteric sclerae. Pupils are equal ENT Nares normal. No drainage or sinus tenderness. Lips, mucosa, and tongue normal. No Thrush Neck: Supple, symmetrical, no adenopathy, thyroid: non tender no carotid bruit and no JVD. Back: No CVA tenderness. Lungs: Clear to auscultation bilaterally. No Wheezing or Rhonchi. No rales. Heart: irregular Abdomen: Soft,  non-tender,not distended. Bowel sounds normal. No masses Extremities: rt leg in cast B/l bruising knee Scab rt knee Skin: No rashes or lesions. Or bruising Lymph: Cervical, supraclavicular normal. Neurologic: Grossly non-focal Pertinent Labs Lab Results CBC    Component Value Date/Time   WBC 4.3 10/11/2019 0026   RBC 3.50 (L) 10/11/2019 0026   HGB 10.2 (L) 10/11/2019  0026   HGB 12.2 07/01/2015 1608   HCT 33.0 (L) 10/11/2019 0026   HCT 38.2 07/01/2015 1608   PLT 284 10/11/2019 0026   PLT 241 07/01/2015 1608   MCV 94.3 10/11/2019 0026   MCV 90 07/01/2015 1608   MCV 96 02/01/2014 1650   MCH 29.1 10/11/2019 0026   MCHC 30.9 10/11/2019 0026   RDW 15.6 (H) 10/11/2019 0026   RDW 15.8 (H) 07/01/2015 1608   RDW 17.7 (H) 02/01/2014 1650   LYMPHSABS 0.1 (L) 10/11/2019 0026   LYMPHSABS 0.9 (L) 02/01/2014 1650   MONOABS 0.1 10/11/2019 0026   MONOABS 0.5 02/01/2014 1650   EOSABS 0.0 10/11/2019 0026   EOSABS 0.2 02/01/2014 1650   BASOSABS 0.0 10/11/2019 0026   BASOSABS 0.1 02/01/2014 1650    CMP Latest Ref Rng & Units 10/12/2019 10/11/2019 10/04/2019  Glucose 70 - 99 mg/dL 90 98 98  BUN 8 - 23 mg/dL 36(H) 19 30(H)  Creatinine 0.44 - 1.00 mg/dL 1.91(H) 1.30(H) 1.26(H)  Sodium 135 - 145 mmol/L 139 142 140  Potassium 3.5 - 5.1 mmol/L 4.9 3.3(L) 4.7  Chloride 98 - 111 mmol/L 106 104 108  CO2 22 - 32 mmol/L 24 26 24   Calcium 8.9 - 10.3 mg/dL 8.2(L) 9.2 9.0  Total Protein 6.5 - 8.1 g/dL - 7.5 -  Total Bilirubin 0.3 - 1.2 mg/dL - 3.1(H) -  Alkaline Phos 38 - 126 U/L - 262(H) -  AST 15 - 41 U/L - 243(H) -  ALT 0 - 44 U/L - 113(H) -      Microbiology: Recent Results (from the past 240 hour(s))  SARS Coronavirus 2 by RT PCR (hospital order, performed in [facility] hospital lab) Nasopharyngeal Nasopharyngeal Swab     Status: None   Collection Time: 10/03/19  3:12 PM   Specimen: Nasopharyngeal Swab  Result Value Ref Range Status   SARS Coronavirus 2 NEGATIVE NEGATIVE Final    Comment: (NOTE) SARS-CoV-2 target nucleic acids are NOT DETECTED. The SARS-CoV-2 RNA is generally detectable in upper and lower respiratory specimens during the acute phase of infection. The lowest concentration of SARS-CoV-2 viral copies this assay can detect is 250 copies / mL. A negative result does not preclude SARS-CoV-2 infection and should not be used as the sole basis for  treatment or other patient management decisions.  A negative result may occur with improper specimen collection / handling, submission of specimen other than nasopharyngeal swab, presence of viral mutation(s) within the areas targeted by this assay, and inadequate number of viral copies (<250 copies / mL). A negative result must be combined with clinical observations, patient history, and epidemiological information. Fact Sheet for Patients:   StrictlyIdeas.no Fact Sheet for Healthcare Providers: BankingDealers.co.za This test is not yet approved or cleared  by the [place] FDA and has been authorized for detection and/or diagnosis of SARS-CoV-2 by FDA under an Emergency Use Authorization (EUA).  This EUA will remain in effect (meaning this test can be used) for the duration of the COVID-19 declaration under Section 564(b)(1) of the Act, 21 U.S.C. section 360bbb-3(b)(1), unless the authorization is terminated or revoked sooner.  Performed at [facility], [address] [place], [place] [address]   Blood Culture (routine x 2)     Status: None (Preliminary result)   Collection Time: 10/11/19 12:26 AM   Specimen: BLOOD LEFT HAND  Result Value Ref Range Status   Specimen Description BLOOD LEFT HAND  Final   Special Requests   Final    BOTTLES DRAWN AEROBIC AND ANAEROBIC Blood Culture adequate volume   Culture  Setup Time   Final    GRAM NEGATIVE RODS IN BOTH AEROBIC AND ANAEROBIC BOTTLES CRITICAL RESULT CALLED TO, READ BACK BY AND VERIFIED WITH: KAREN HAYES AT 1156 ON 10/11/2019 [facility]. Performed at [facility], [address] [place], [place] [address]    Culture GRAM NEGATIVE RODS  Final   Report Status PENDING  Incomplete  Blood Culture (routine x 2)     Status: None (Preliminary result)   Collection Time: 10/11/19 12:26 AM   Specimen: Left Antecubital; Blood  Result Value Ref Range Status   Specimen  Description   Final    LEFT ANTECUBITAL Performed at [facility], [address], [place], [place] [address]    Special Requests   Final    BOTTLES DRAWN AEROBIC AND ANAEROBIC Blood Culture adequate volume Performed at [facility], [address] [place], [place] [address]    Culture  Setup Time   Final    GRAM NEGATIVE RODS IN BOTH AEROBIC AND ANAEROBIC BOTTLES CRITICAL RESULT CALLED TO, READ BACK BY AND VERIFIED WITH: KAREN HAYES AT 1156 ON 10/11/2019 [facility]. Performed at [facility], [address] [place], [place] [address]    Culture GRAM NEGATIVE RODS  Final   Report Status PENDING  Incomplete  Urine culture     Status: Abnormal   Collection Time: 10/11/19 12:26 AM   Specimen: In/Out Cath Urine  Result Value Ref Range Status   Specimen Description   Final    IN/OUT CATH URINE Performed at [facility], [address] [place], [place] [address]    Special Requests   Final    NONE Performed at [facility], [address] [place], [place] [address]    Culture MULTIPLE SPECIES PRESENT, SUGGEST RECOLLECTION (A)  Final   Report Status 10/11/2019 FINAL  Final  SARS Coronavirus 2 by RT PCR (hospital order, performed in [facility] hospital lab) Nasopharyngeal     Status: None   Collection Time: 10/11/19 12:26 AM   Specimen: Nasopharyngeal  Result Value Ref Range Status   SARS Coronavirus 2 NEGATIVE NEGATIVE Final    Comment: (NOTE) SARS-CoV-2 target nucleic acids are NOT DETECTED.  The SARS-CoV-2 RNA is generally detectable in upper and lower respiratory specimens during the acute phase of infection. The lowest concentration of SARS-CoV-2 viral copies this assay can detect is 250 copies / mL. A negative result does not preclude SARS-CoV-2 infection and should not be used as the sole basis for treatment or other patient management decisions.  A negative result may occur with improper specimen collection / handling, submission  of specimen other than nasopharyngeal swab, presence of viral mutation(s) within the areas targeted by this assay, and inadequate number of viral copies (<250 copies / mL). A negative result must be combined with clinical observations, patient history, and epidemiological information.  Fact Sheet for Patients:   BoilerBrush.com.cy  Fact Sheet for Healthcare Providers: https://pope.com/  This test is not yet approved or  cleared by the [place] FDA and has  been authorized for detection and/or diagnosis of SARS-CoV-2 by FDA under an Emergency Use Authorization (EUA).  This EUA will remain in effect (meaning this test can be used) for the duration of the COVID-19 declaration under Section 564(b)(1) of the Act, 21 U.S.C. section 360bbb-3(b)(1), unless the authorization is terminated or revoked sooner.  Performed at [facility], [address] [place], [place] [address]   Blood Culture ID Panel (Reflexed)     Status: Abnormal   Collection Time: 10/11/19 12:26 AM  Result Value Ref Range Status   Enterococcus species NOT DETECTED NOT DETECTED Final   Listeria monocytogenes NOT DETECTED NOT DETECTED Final   Staphylococcus species NOT DETECTED NOT DETECTED Final   Staphylococcus aureus (BCID) NOT DETECTED NOT DETECTED Final   Streptococcus species NOT DETECTED NOT DETECTED Final   Streptococcus agalactiae NOT DETECTED NOT DETECTED Final   Streptococcus pneumoniae NOT DETECTED NOT DETECTED Final   Streptococcus pyogenes NOT DETECTED NOT DETECTED Final   Acinetobacter baumannii NOT DETECTED NOT DETECTED Final   Enterobacteriaceae species DETECTED (A) NOT DETECTED Final    Comment: Enterobacteriaceae represent a large family of gram-negative bacteria, not a single organism. CRITICAL RESULT CALLED TO, READ BACK BY AND VERIFIED WITH: KAREN HAYES AT 1156 ON 10/11/2019 [facility].    Enterobacter cloacae complex NOT DETECTED NOT DETECTED  Final   Escherichia coli DETECTED (A) NOT DETECTED Final    Comment: CRITICAL RESULT CALLED TO, READ BACK BY AND VERIFIED WITH: KAREN HAYES AT 1156 ON 10/11/2019 [facility].    Klebsiella oxytoca NOT DETECTED NOT DETECTED Final   Klebsiella pneumoniae NOT DETECTED NOT DETECTED Final   Proteus species NOT DETECTED NOT DETECTED Final   Serratia marcescens NOT DETECTED NOT DETECTED Final   Carbapenem resistance NOT DETECTED NOT DETECTED Final   Haemophilus influenzae NOT DETECTED NOT DETECTED Final   Neisseria meningitidis NOT DETECTED NOT DETECTED Final   Pseudomonas aeruginosa NOT DETECTED NOT DETECTED Final   Candida albicans NOT DETECTED NOT DETECTED Final   Candida glabrata NOT DETECTED NOT DETECTED Final   Candida krusei NOT DETECTED NOT DETECTED Final   Candida parapsilosis NOT DETECTED NOT DETECTED Final   Candida tropicalis NOT DETECTED NOT DETECTED Final    Comment: Performed at [facility], [address] [place], [place] [address]    IMAGING RESULTS: cardiolmegaly with congestion I have personally reviewed the films ? Impression/Recommendation ? Acute hypoxic resp failure- due to CHF/pulmonary congestion on IV lasix  E.coli bacteremia- unclear source- no urinary symptoms, no Gi symptoms- but with increased lFTS including increase in alkaline phosphatase and ultrasound showing layering gall stones need to r/o cholangitis, cholecystitis- will need HIDa scan/CT abdomen ?MRCP Recent ORIF- need to look at surgical site- covered with cast Also had b/l knee injections on 6/8 but clinically no evidence of septic knee On meropenem  Afib On anticoagulation  AKI on CKD  Essential HTN   ? ? ___________________________________________________ Discussed with patient and  requesting provider Note:  This document was prepared using Dragon voice recognition software and may include unintentional dictation errors.

## 2019-10-12 NOTE — Progress Notes (Addendum)
PROGRESS NOTE    Stacey [name]  JQZ:[id number] DOB: June 17, 1950 DOA: 10/11/2019 PCP: Idelle Crouch, MD   Chief complaint.  Shortness of breath.   Brief Narrative:  Stacey [name] is a 69 y.o. female with medical history significant of hypertension, hyperlipidemia, GERD, hypothyroidism, anxiety, PAF on Eliquis, OSA on CPAP, CAD, drug-induced torsade de pointes, morbid obesity,dCHF, who presents with SOB. Patient was recently hospitalized from 6/7-6/10 due to right ankle fracture.  Patient is s/p of ORIF on 10/01/2019.  Patient states that her surgical site has been healing well.   In the emergency room, she had oxygen saturation of 86%, was placed on 3 L oxygen.  X-ray showed cardiomegaly with interstitial pulmonary edema.  BMP was elevated at 1800.  Right upper outer quadrant ultrasound showed no evidence of acute cholecystitis. Patient was admitted to the hospital for acute on chronic diastolic congestive heart failure, was started on IV Lasix. She also has positive blood culture with E. coli, currently treated with IV meropenem.  However, patient has normal UA, no evidence of pneumonia.  But her procalcitonin level was elevated at 6.08.  Assessment & Plan:   Principal Problem:   Acute on chronic diastolic CHF (congestive heart failure) (HCC) Active Problems:   GERD (gastroesophageal reflux disease)   Atrial fibrillation (HCC)   Coronary artery disease   Essential hypertension   Hypothyroidism   CKD (chronic kidney disease), stage IIIa   HLD (hyperlipidemia)   Acute respiratory failure with hypoxia (HCC)   Abnormal LFTs   Hypokalemia   Hypomagnesemia   Elevated lactic acid level   Bacteremia due to Gram-negative bacteria  #1.  Acute on chronic diastolic congestive heart failure. Echocardiogram performed yesterday showed normal ejection fraction, but a poor quality.  She does has evidence of congestive heart of exacerbation.  She was giving IV Lasix, creatinine level went up  to 1.9.  Will change Lasix to oral.  Recheck a BMP tomorrow. Echocardiogram in early 2019 showed ejection fraction of 25%, most recent echocardiogram x2 showed ejection fraction normal.  She is still on Entresto, will continue.  #2.  E. coli bacteremia. No apparent source of infection identified.  However patient has profound elevation of procalcitonin level.  We will continue meropenem until the final culture results available.  Obtain consult from infectious disease.  #3.  Atrial fibrillation. Coreg and amiodarone during hold due to relative bradycardia. Continue anticoagulation.  4.  Essential hypertension. Continue home medicines.  5.  Elevated liver enzymes. No evidence of cholecystitis or obstruction.  6.  Mild lactic acidosis.  7.  Acute kidney injury on chronic kidney disease stage IIIa.   Worsening renal function due to diuretics.  Will change Lasix to oral.  8.  Acute hypoxemic respite failure.  Secondary to congestive heart of exacerbation.  Wean oxygen.   DVT prophylaxis: Eliquis Code Status: Full Family Communication: None Disposition Plan:  . Patient came from: SNF            . Anticipated d/c place:SNF . Barriers to d/c OR conditions which need to be met to effect a safe d/c:   Consultants:   ID  Procedures: None Antimicrobials:  Meropenem.  Subjective: Patient feels short of breath much better today.  Still on 2 L oxygen.  No cough. No fever or chills. No dysuria hematuria. No diarrhea or constipation.  No abdominal pain or nausea vomiting.  Objective: Vitals:   10/11/19 2122 10/11/19 2203 10/12/19 0452 10/12/19 0738  BP: (!) 110/51 (!) 105/52 Marland Kitchen)  111/55 (!) 95/41  Pulse: (!) 57 (!) 58 61 (!) 57  Resp: 16 18 (!) 24 18  Temp:  98 F (36.7 C) 99 F (37.2 C) 98.5 F (36.9 C)  TempSrc:  Oral Oral Oral  SpO2: 100% 100% 99% 98%  Weight:  113.1 kg 114.2 kg   Height:  '5\' 3"'$  (1.6 m)      Intake/Output Summary (Last 24 hours) at 10/12/2019  0924 Last data filed at 10/12/2019 0515 Gross per 24 hour  Intake 255.38 ml  Output 850 ml  Net -594.62 ml   Filed Weights   10/11/19 0014 10/11/19 2203 10/12/19 0452  Weight: 106.6 kg 113.1 kg 114.2 kg    Examination:  General exam: Appears calm and comfortable.  Morbid obese Respiratory system: Clear to auscultation. Respiratory effort normal. Cardiovascular system: Irregular, normal rate. No JVD, murmurs, rubs, gallops or clicks. No pedal edema. Gastrointestinal system: Abdomen is nondistended, soft and nontender. No organomegaly or masses felt. Normal bowel sounds heard. Central nervous system: Alert and oriented. No focal neurological deficits. Extremities: Symmetric  Skin: No rashes, lesions or ulcers Psychiatry: Judgement and insight appear normal. Mood & affect appropriate.     Data Reviewed: I have personally reviewed following labs and imaging studies  CBC: Recent Labs  Lab 10/11/19 0026  WBC 4.3  NEUTROABS 4.2  HGB 10.2*  HCT 33.0*  MCV 94.3  PLT 494   Basic Metabolic Panel: Recent Labs  Lab 10/11/19 0026 10/11/19 0318 10/12/19 0550  NA 142  --  139  K 3.3*  --  4.9  CL 104  --  106  CO2 26  --  24  GLUCOSE 98  --  90  BUN 19  --  36*  CREATININE 1.30*  --  1.91*  CALCIUM 9.2  --  8.2*  MG  --  1.3* 2.1   GFR: Estimated Creatinine Clearance: 34.8 mL/min (A) (by C-G formula based on SCr of 1.91 mg/dL (H)). Liver Function Tests: Recent Labs  Lab 10/11/19 0026  AST 243*  ALT 113*  ALKPHOS 262*  BILITOT 3.1*  PROT 7.5  ALBUMIN 3.4*   No results for input(s): LIPASE, AMYLASE in the last 168 hours. No results for input(s): AMMONIA in the last 168 hours. Coagulation Profile: Recent Labs  Lab 10/11/19 0026  INR 1.5*   Cardiac Enzymes: No results for input(s): CKTOTAL, CKMB, CKMBINDEX, TROPONINI in the last 168 hours. BNP (last 3 results) No results for input(s): PROBNP in the last 8760 hours. HbA1C: No results for input(s): HGBA1C in  the last 72 hours. CBG: No results for input(s): GLUCAP in the last 168 hours. Lipid Profile: No results for input(s): CHOL, HDL, LDLCALC, TRIG, CHOLHDL, LDLDIRECT in the last 72 hours. Thyroid Function Tests: Recent Labs    10/11/19 0318  TSH 0.530   Anemia Panel: No results for input(s): VITAMINB12, FOLATE, FERRITIN, TIBC, IRON, RETICCTPCT in the last 72 hours. Sepsis Labs: Recent Labs  Lab 10/11/19 0026 10/11/19 1234 10/11/19 1814  PROCALCITON 6.08  --   --   LATICACIDVEN 2.2* 2.4* 1.2    Recent Results (from the past 240 hour(s))  SARS Coronavirus 2 by RT PCR (hospital order, performed in [facility] hospital lab) Nasopharyngeal Nasopharyngeal Swab     Status: None   Collection Time: 10/03/19  3:12 PM   Specimen: Nasopharyngeal Swab  Result Value Ref Range Status   SARS Coronavirus 2 NEGATIVE NEGATIVE Final    Comment: (NOTE) SARS-CoV-2 target nucleic acids are NOT  DETECTED. The SARS-CoV-2 RNA is generally detectable in upper and lower respiratory specimens during the acute phase of infection. The lowest concentration of SARS-CoV-2 viral copies this assay can detect is 250 copies / mL. A negative result does not preclude SARS-CoV-2 infection and should not be used as the sole basis for treatment or other patient management decisions.  A negative result may occur with improper specimen collection / handling, submission of specimen other than nasopharyngeal swab, presence of viral mutation(s) within the areas targeted by this assay, and inadequate number of viral copies (<250 copies / mL). A negative result must be combined with clinical observations, patient history, and epidemiological information. Fact Sheet for Patients:   BoilerBrush.com.cy Fact Sheet for Healthcare Providers: https://pope.com/ This test is not yet approved or cleared  by the [place] FDA and has been authorized for detection and/or diagnosis of  SARS-CoV-2 by FDA under an Emergency Use Authorization (EUA).  This EUA will remain in effect (meaning this test can be used) for the duration of the COVID-19 declaration under Section 564(b)(1) of the Act, 21 U.S.C. section 360bbb-3(b)(1), unless the authorization is terminated or revoked sooner. Performed at [facility], [address] [place], [place] [address]   Blood Culture (routine x 2)     Status: None (Preliminary result)   Collection Time: 10/11/19 12:26 AM   Specimen: BLOOD LEFT HAND  Result Value Ref Range Status   Specimen Description BLOOD LEFT HAND  Final   Special Requests   Final    BOTTLES DRAWN AEROBIC AND ANAEROBIC Blood Culture adequate volume   Culture  Setup Time   Final    GRAM NEGATIVE RODS IN BOTH AEROBIC AND ANAEROBIC BOTTLES CRITICAL RESULT CALLED TO, READ BACK BY AND VERIFIED WITH: KAREN HAYES AT 1156 ON 10/11/2019 [facility]. Performed at [facility], [address] [place], [place] [address]    Culture GRAM NEGATIVE RODS  Final   Report Status PENDING  Incomplete  Blood Culture (routine x 2)     Status: None (Preliminary result)   Collection Time: 10/11/19 12:26 AM   Specimen: Left Antecubital; Blood  Result Value Ref Range Status   Specimen Description   Final    LEFT ANTECUBITAL Performed at [facility], [address], [place], [place] [address]    Special Requests   Final    BOTTLES DRAWN AEROBIC AND ANAEROBIC Blood Culture adequate volume Performed at [facility], [address] [place], [place] [address]    Culture  Setup Time   Final    GRAM NEGATIVE RODS IN BOTH AEROBIC AND ANAEROBIC BOTTLES CRITICAL RESULT CALLED TO, READ BACK BY AND VERIFIED WITH: KAREN HAYES AT 1156 ON 10/11/2019 [facility]. Performed at [facility], [address], [place], [place] [address]    Culture GRAM NEGATIVE RODS  Final   Report Status PENDING  Incomplete  Urine culture     Status: Abnormal   Collection Time:  10/11/19 12:26 AM   Specimen: In/Out Cath Urine  Result Value Ref Range Status   Specimen Description   Final    IN/OUT CATH URINE Performed at [facility], [address], [place], [place] [address]    Special Requests   Final    NONE Performed at [facility], [address] [place], [place] [address]    Culture MULTIPLE SPECIES PRESENT, SUGGEST RECOLLECTION (A)  Final   Report Status 10/11/2019 FINAL  Final  SARS Coronavirus 2 by RT PCR (hospital  order, performed in [place] hospital lab) Nasopharyngeal     Status: None   Collection Time: 10/11/19 12:26 AM   Specimen: Nasopharyngeal  Result Value Ref Range Status   SARS Coronavirus 2 NEGATIVE NEGATIVE Final    Comment: (NOTE) SARS-CoV-2 target nucleic acids are NOT DETECTED.  The SARS-CoV-2 RNA is generally detectable in upper and lower respiratory specimens during the acute phase of infection. The lowest concentration of SARS-CoV-2 viral copies this assay can detect is 250 copies / mL. A negative result does not preclude SARS-CoV-2 infection and should not be used as the sole basis for treatment or other patient management decisions.  A negative result may occur with improper specimen collection / handling, submission of specimen other than nasopharyngeal swab, presence of viral mutation(s) within the areas targeted by this assay, and inadequate number of viral copies (<250 copies / mL). A negative result must be combined with clinical observations, patient history, and epidemiological information.  Fact Sheet for Patients:   StrictlyIdeas.no  Fact Sheet for Healthcare Providers: BankingDealers.co.za  This test is not yet approved or  cleared by the [place] FDA and has been authorized for detection and/or diagnosis of SARS-CoV-2 by FDA under an Emergency Use Authorization (EUA).  This EUA will remain in effect (meaning this test can be used) for  the duration of the COVID-19 declaration under Section 564(b)(1) of the Act, 21 U.S.C. section 360bbb-3(b)(1), unless the authorization is terminated or revoked sooner.  Performed at [facility], [address] [place], [place] [address]   Blood Culture ID Panel (Reflexed)     Status: Abnormal   Collection Time: 10/11/19 12:26 AM  Result Value Ref Range Status   Enterococcus species NOT DETECTED NOT DETECTED Final   Listeria monocytogenes NOT DETECTED NOT DETECTED Final   Staphylococcus species NOT DETECTED NOT DETECTED Final   Staphylococcus aureus (BCID) NOT DETECTED NOT DETECTED Final   Streptococcus species NOT DETECTED NOT DETECTED Final   Streptococcus agalactiae NOT DETECTED NOT DETECTED Final   Streptococcus pneumoniae NOT DETECTED NOT DETECTED Final   Streptococcus pyogenes NOT DETECTED NOT DETECTED Final   Acinetobacter baumannii NOT DETECTED NOT DETECTED Final   Enterobacteriaceae species DETECTED (A) NOT DETECTED Final    Comment: Enterobacteriaceae represent a large family of gram-negative bacteria, not a single organism. CRITICAL RESULT CALLED TO, READ BACK BY AND VERIFIED WITH: KAREN HAYES AT 7782 ON 10/11/2019 [place].    Enterobacter cloacae complex NOT DETECTED NOT DETECTED Final   Escherichia coli DETECTED (A) NOT DETECTED Final    Comment: CRITICAL RESULT CALLED TO, READ BACK BY AND VERIFIED WITH: KAREN HAYES AT 1156 ON 10/11/2019 [place].    Klebsiella oxytoca NOT DETECTED NOT DETECTED Final   Klebsiella pneumoniae NOT DETECTED NOT DETECTED Final   Proteus species NOT DETECTED NOT DETECTED Final   Serratia marcescens NOT DETECTED NOT DETECTED Final   Carbapenem resistance NOT DETECTED NOT DETECTED Final   Haemophilus influenzae NOT DETECTED NOT DETECTED Final   Neisseria meningitidis NOT DETECTED NOT DETECTED Final   Pseudomonas aeruginosa NOT DETECTED NOT DETECTED Final   Candida albicans NOT DETECTED NOT DETECTED Final   Candida glabrata NOT DETECTED  NOT DETECTED Final   Candida krusei NOT DETECTED NOT DETECTED Final   Candida parapsilosis NOT DETECTED NOT DETECTED Final   Candida tropicalis NOT DETECTED NOT DETECTED Final    Comment: Performed at [facility], [address] [place], [place] [address]         Radiology Studies:  DG Chest Port 1 View  Result Date: 10/11/2019 CLINICAL DATA:  Shortness of breath EXAM: PORTABLE CHEST 1 VIEW COMPARISON:  October 01, 2019 FINDINGS: There is mild cardiomegaly. Prominence of the central pulmonary vasculature is seen. There is mildly increased interstitial markings seen throughout both lungs. No large airspace consolidation or pleural effusion. No acute osseous abnormality. IMPRESSION: Cardiomegaly and interstitial edema. Electronically Signed   By: Prudencio Pair M.D.   On: 10/11/2019 00:38   ECHOCARDIOGRAM COMPLETE  Result Date: 10/11/2019    ECHOCARDIOGRAM REPORT   Patient Name:   TALISA PETRAK Date of Exam: 10/11/2019 Medical Rec #:  [id number]       Height:       63.0 in Accession #:    [id number]      Weight:       235.0 lb Date of Birth:  Aug 17, 1950        BSA:          2.071 m Patient Age:    41 years        BP:           120/61 mmHg Patient Gender: F               HR:           60 bpm. Exam Location:  [facility] Procedure: 2D Echo, Color Doppler, Cardiac Doppler and Intracardiac            Opacification Agent Indications:     I50.31 CHF-Acute Diastolic  History:         Patient has prior history of Echocardiogram examinations.                  Non-obstructive CAD, Arrythmias:Paroxysmal afib; Risk                  Factors:Sleep Apnea, Hypertension and HCL.  Sonographer:     Charmayne Sheer RDCS (AE) Referring Phys:  [id number] Arvella Merles MANSY Diagnosing Phys: Ida Rogue MD  Sonographer Comments: Technically difficult study due to poor echo windows and no parasternal window. Image acquisition challenging due to patient body habitus. IMPRESSIONS  1. Left ventricular ejection fraction, by estimation, is 55  to 60%. The left ventricle has normal function. The left ventricle has no regional wall motion abnormalities. Left ventricular diastolic parameters were normal.  2. Right ventricular systolic function is normal. The right ventricular size is normal. Tricuspid regurgitation signal is inadequate for assessing PA pressure.  3. Challenging image quality FINDINGS  Left Ventricle: Left ventricular ejection fraction, by estimation, is 55 to 60%. The left ventricle has normal function. The left ventricle has no regional wall motion abnormalities. Definity contrast agent was given IV to delineate the left ventricular  endocardial borders. The left ventricular internal cavity size was normal in size. There is no left ventricular hypertrophy. Left ventricular diastolic parameters were normal. Right Ventricle: The right ventricular size is normal. No increase in right ventricular wall thickness. Right ventricular systolic function is normal. Tricuspid regurgitation signal is inadequate for assessing PA pressure. Left Atrium: Left atrial size was normal in size. Right Atrium: Right atrial size was normal in size. Pericardium: There is no evidence of pericardial effusion. Mitral Valve: The mitral valve is normal in structure. Normal mobility of the mitral valve leaflets. No evidence of mitral valve regurgitation. No evidence of mitral valve stenosis. MV peak gradient, 5.8 mmHg. The mean mitral valve gradient is 2.0 mmHg. Tricuspid Valve: The tricuspid valve is normal in structure. Tricuspid valve  regurgitation is not demonstrated. No evidence of tricuspid stenosis. Aortic Valve: The aortic valve is normal in structure. Aortic valve regurgitation is not visualized. No aortic stenosis is present. Aortic valve mean gradient measures 9.0 mmHg. Aortic valve peak gradient measures 18.1 mmHg. Pulmonic Valve: The pulmonic valve was normal in structure. Pulmonic valve regurgitation is not visualized. No evidence of pulmonic stenosis.  Aorta: The aortic root is normal in size and structure. Venous: The inferior vena cava is normal in size with greater than 50% respiratory variability, suggesting right atrial pressure of 3 mmHg. IAS/Shunts: No atrial level shunt detected by color flow Doppler.   LV Volumes (MOD) LV vol d, MOD A4C: 128.0 ml Diastology LV vol s, MOD A4C: 64.2 ml  LV e' lateral:   7.83 cm/s LV SV MOD A4C:     128.0 ml LV E/e' lateral: 13.9                             LV e' medial:    8.27 cm/s                             LV E/e' medial:  13.2  AORTIC VALVE AV Vmax:           213.00 cm/s AV Vmean:          132.000 cm/s AV VTI:            0.414 m AV Peak Grad:      18.1 mmHg AV Mean Grad:      9.0 mmHg LVOT Vmax:         120.00 cm/s LVOT Vmean:        76.600 cm/s LVOT VTI:          0.253 m LVOT/AV VTI ratio: 0.61 MITRAL VALVE MV Area (PHT): 2.16 cm     SHUNTS MV Peak grad:  5.8 mmHg     Systemic VTI: 0.25 m MV Mean grad:  2.0 mmHg MV Vmax:       1.20 m/s MV Vmean:      60.3 cm/s MV Decel Time: 352 msec MV E velocity: 109.00 cm/s MV A velocity: 63.50 cm/s MV E/A ratio:  1.72 Ida Rogue MD Electronically signed by Ida Rogue MD Signature Date/Time: 10/11/2019/4:52:56 PM    Final    US ABDOMEN LIMITED RUQ  Result Date: 10/11/2019 CLINICAL DATA:  Elevated LFTs EXAM: ULTRASOUND ABDOMEN LIMITED RIGHT UPPER QUADRANT COMPARISON:  None. FINDINGS: Gallbladder: Layering calcified gallstones are present the largest measuring 1.8 cm. No gallbladder wall thickening. No sonographic Murphy sign noted by sonographer. Common bile duct: Diameter: 4.6 mm Liver: Increased echotexture seen throughout. No focal abnormality or biliary ductal dilatation. Portal vein is patent on color Doppler imaging with normal direction of blood flow towards the liver. Other: A small right pleural effusion is present. IMPRESSION: Cholelithiasis without evidence of acute cholecystitis. Hepatic steatosis Small right pleural effusion Electronically Signed   By: Prudencio Pair M.D.   On: 10/11/2019 02:47        Scheduled Meds: . apixaban  5 mg Oral BID  . levothyroxine  100 mcg Oral Daily  . pantoprazole  40 mg Oral Daily  . rosuvastatin  10 mg Oral QHS  . sacubitril-valsartan  1 tablet Oral BID  . senna-docusate  2 tablet Oral BID  . sodium chloride flush  3 mL Intravenous Q12H   Continuous Infusions: .  sodium chloride Stopped (10/11/19 2326)  . meropenem (MERREM) IV 1 g (10/12/19 0825)     LOS: 1 day    Time spent: 28 minutes    Sharen Hones, MD [facility]   To contact the attending provider between 7A-7P or the covering provider during after hours 7P-7A, please log into the web site [email or web address] and access using universal [place] password for that web site. If you do not have the password, please call the hospital operator.  10/12/2019, 9:24 AM

## 2019-10-12 NOTE — Consult Note (Signed)
Pharmacy Antibiotic Note  Stacey [name] is a 69 y.o. female admitted on 10/11/2019 with bacteremia.  Pharmacy has been consulted for meropenem dosing. Bcx growing E.coli  Plan: Meropenem 1 g q12H.    Height: 5\' 3"  (160 cm) Weight: 114.2 kg (251 lb 12.3 oz) IBW/kg (Calculated) : 52.4  Temp (24hrs), Avg:98.5 F (36.9 C), Min:98 F (36.7 C), Max:99 F (37.2 C)  Recent Labs  Lab 10/11/19 0026 10/11/19 1234 10/11/19 1814 10/12/19 0550  WBC 4.3  --   --   --   CREATININE 1.30*  --   --  1.91*  LATICACIDVEN 2.2* 2.4* 1.2  --     Estimated Creatinine Clearance: 34.8 mL/min (A) (by C-G formula based on SCr of 1.91 mg/dL (H)).    Allergies  Allergen Reactions  . Levaquin [Levofloxacin In D5w] Other (See Comments)    Severe dizziness   . Metoprolol Itching, Nausea And Vomiting, Rash and Other (See Comments)    Dizziness  . Tikosyn [Dofetilide] Other (See Comments)    Developed Torsades on Tikosyn 09/28/17  . Ace Inhibitors Cough   Microbiology results: 6/17 BCx: Ecoli f/u with sensitivities  6/17 UCx: MULTIPLE SPECIES PRESENT, SUGGEST RECOLLECTION   Thank you for allowing pharmacy to be a part of this patient's care.  7/17, PharmD, BCPS 10/12/2019 1:19 PM

## 2019-10-12 NOTE — Consult Note (Signed)
ORTHOPAEDIC CONSULTATION  REQUESTING PHYSICIAN: Marrion Coy, MD  Chief Complaint:   Status post ORIF of unstable right ankle fracture.  History of Present Illness: Stacey [name] is a 69 y.o. female with multiple medical problems who is now 11 days status post an open reduction and internal fixation of an unstable right distal fibular fracture.  The patient apparently was recuperating well at her rehab facility, progressing with her bed to chair transfers nonweightbearing on her right foot until 2 nights ago when she was brought back to the emergency room for shortness of breath.  Further work-up demonstrated blood cultures which were positive for E. coli.  Because of this, I have been asked to remove the patient's splint and dressing to assess the wound for any evidence of infection to determine if this might be the cause of her E. coli sepsis.  The patient denies any specific complaints pertaining to her foot.  She notes some soreness, but feels that the soreness has been improving.  She had been pleased with her progress at rehab and denies any reinjury to the ankle.  Past Medical History:  Diagnosis Date   Anxiety    Arthritis    "knees" (09/26/2017)   Chronic diastolic CHF (congestive heart failure) (HCC)    a. Dx 08/2013 in setting of rapid afib;  b. 08/2013 Echo: EF 55-60%, mildly dil LA, nl RV.   Drug-induced torsades de pointes 10/06/2017   GERD (gastroesophageal reflux disease)    High cholesterol    History of blood transfusion 1975   "related to ectopic pregnancy   Hypertension    Non-obstructive CAD    a. 10/2013 Myoview: anteroseptal, apical, septal mild ischemia, nl LV fxn;  b. Cath: LAD 81m LCX 20ost, RCA 76m.   Obesity    OSA on CPAP    Paroxysmal atrial fibrillation (HCC)    a. Dx 08/2013. CHA2DS2VASc = 3 (diast chf, htn, female) -->Xarelto initiated; b. 09/2013 s/p DCCV;  c. 10/2013 recurrent  afib->Flecainide added->S/P DCCV;  d. 11/2013 Recurrent afib while off of flecainide (2/2 abnl nuc study)-->Flecainide resumed after nl cath-->DCCV;  e. Recurrent Afib 01/2014-->Flecainide increased to 150mg  BID.   Pneumonia    "1-2 times" (09/26/2017)   Past Surgical History:  Procedure Laterality Date   BREAST BIOPSY Right 2007   stereo-neg   CARDIAC CATHETERIZATION  10/2013   [facility]   CARDIOVERSION N/A 08/29/2017   Procedure: CARDIOVERSION;  Surgeon: 10/29/2017, MD;  Location: [facility] ORS;  Service: Cardiovascular;  Laterality: N/A;   CARDIOVERSION     "I've had a total of 4" (09/26/2017)   CARDIOVERSION N/A 12/08/2018   Procedure: CARDIOVERSION;  Surgeon: 12/10/2018, MD;  Location: [facility] OR[facility];  Service: Cardiovascular;  Laterality: N/A;   ECTOPIC PREGNANCY SURGERY  1975   ORIF ANKLE FRACTURE Right 10/01/2019   Procedure: OPEN REDUCTION INTERNAL FIXATION (ORIF) ANKLE FRACTURE;  Surgeon: 12/01/2019, MD;  Location: [facility] ORS;  Service: Orthopedics;  Laterality: Right;   TONSILLECTOMY     Social History   Socioeconomic History   Marital status: Married    Spouse name: Not on file   Number of children: Not on file   Years of education: Not on file   Highest education level: Not on file  Occupational History   Not on file  Tobacco Use   Smoking status: Never Smoker   Smokeless tobacco: Never Used  Vaping Use   Vaping Use: Never used  Substance and Sexual Activity   Alcohol use:  Yes    Alcohol/week: 7.0 standard drinks    Types: 7 Glasses of wine per week    Comment: 1 glass of wine/day.   Drug use: Never   Sexual activity: Not Currently  Other Topics Concern   Not on file  Social History Narrative   Lives in [place].  Works as Water quality scientist.    Social Determinants of Health   Financial Resource Strain:    Difficulty of Paying Living Expenses:   Food Insecurity:    Worried About Programme researcher, broadcasting/film/video in the Last Year:    Barista in  the Last Year:   Transportation Needs:    Freight forwarder (Medical):    Lack of Transportation (Non-Medical):   Physical Activity:    Days of Exercise per Week:    Minutes of Exercise per Session:   Stress:    Feeling of Stress :   Social Connections:    Frequency of Communication with Friends and Family:    Frequency of Social Gatherings with Friends and Family:    Attends Religious Services:    Active Member of Clubs or Organizations:    Attends Engineer, structural:    Marital Status:    Family History  Problem Relation Age of Onset   CAD Father        s/p cabg - ? h/o afib.   Heart attack Father    Hypertension Mother    Breast cancer Mother 22   Allergies  Allergen Reactions   Levaquin [Levofloxacin In D5w] Other (See Comments)    Severe dizziness    Metoprolol Itching, Nausea And Vomiting, Rash and Other (See Comments)    Dizziness   Tikosyn [Dofetilide] Other (See Comments)    Developed Torsades on Tikosyn 09/28/17   Ace Inhibitors Cough   Prior to Admission medications   Medication Sig Start Date End Date Taking? Authorizing Provider  acetaminophen (TYLENOL) 325 MG tablet Take 1-2 tablets (325-650 mg total) by mouth every 6 (six) hours as needed for mild pain (pain score 1-3 or temp > 100.5). Patient taking differently: Take 325 mg by mouth every 6 (six) hours as needed for mild pain (pain score 1-3 or temp > 100.5).  10/04/19  Yes Darlin Priestly, MD  acetaminophen (TYLENOL) 325 MG tablet Take 650 mg by mouth every 8 (eight) hours.   Yes [provider]  albuterol (ACCUNEB) 0.63 MG/3ML nebulizer solution Take 1 ampule by nebulization every 4 (four) hours as needed for wheezing.   Yes [provider]  amiodarone (PACERONE) 200 MG tablet TAKE 1 TABLET(200 MG) BY MOUTH DAILY 01/02/19  Yes Duke Salvia, MD  apixaban (ELIQUIS) 5 MG TABS tablet Take 1 tablet (5 mg total) by mouth 2 (two) times daily. 05/18/19  Yes Duke Salvia, MD  carvedilol (COREG) 12.5 MG tablet TAKE 1 TABLET(12.5 MG) BY MOUTH TWICE DAILY WITH A MEAL 04/10/19  Yes Duke Salvia, MD  cefTRIAXone (ROCEPHIN) 1 g injection Inject 1 g into the muscle once.   Yes [provider]  diclofenac sodium (VOLTAREN) 1 % GEL Apply 1 application topically 3 (three) times daily as needed (knee pain).   Yes [provider]  docusate sodium (COLACE) 100 MG capsule Take 1 capsule (100 mg total) by mouth 2 (two) times daily. 10/04/19  Yes Darlin Priestly, MD  esomeprazole (NEXIUM) 40 MG capsule TAKE 1 CAPSULE BY MOUTH EVERY MORNING Patient taking differently: Take 40 mg by mouth  daily.  12/15/17  Yes Deboraha Sprang, MD  ipratropium-albuterol (DUONEB) 0.5-2.5 (3) MG/3ML SOLN Take 3 mLs by nebulization every 6 (six) hours as needed.   Yes [provider]  levothyroxine (SYNTHROID) 100 MCG tablet Take 100 mcg by mouth daily.  10/02/18  Yes [provider]  ondansetron (ZOFRAN) 4 MG tablet Take 4 mg by mouth 2 (two) times daily as needed for nausea or vomiting.   Yes [provider]  oxyCODONE (OXY IR/ROXICODONE) 5 MG immediate release tablet Take 1-2 tablets (5-10 mg total) by mouth every 4 (four) hours as needed for moderate pain. Patient taking differently: Take 5-10 mg by mouth every 4 (four) hours as needed for moderate pain or severe pain.  10/02/19  Yes Lattie Corns, PA-C  rosuvastatin (CRESTOR) 10 MG tablet Take 10 mg by mouth at bedtime. 09/29/19  Yes [provider]  saccharomyces boulardii (FLORASTOR) 250 MG capsule Take 250 mg by mouth 2 (two) times daily.   Yes [provider]  sacubitril-valsartan (ENTRESTO) 49-51 MG Take 1 tablet by mouth 2 (two) times daily.   Yes [provider]  zolpidem (AMBIEN) 10 MG tablet Take 1 tablet (10 mg total) by mouth at bedtime as needed for sleep. Patient taking differently: Take 10 mg by mouth at bedtime.  10/04/19  Yes Enzo Bi, MD  furosemide (LASIX) 40 MG  tablet Hold until PCP followup due to AKI. Patient not taking: Reported on 10/11/2019 10/04/19   Enzo Bi, MD   DG Chest Port 1 View  Result Date: 10/11/2019 CLINICAL DATA:  Shortness of breath EXAM: PORTABLE CHEST 1 VIEW COMPARISON:  October 01, 2019 FINDINGS: There is mild cardiomegaly. Prominence of the central pulmonary vasculature is seen. There is mildly increased interstitial markings seen throughout both lungs. No large airspace consolidation or pleural effusion. No acute osseous abnormality. IMPRESSION: Cardiomegaly and interstitial edema. Electronically Signed   By: Prudencio Pair M.D.   On: 10/11/2019 00:38   ECHOCARDIOGRAM COMPLETE  Result Date: 10/11/2019    ECHOCARDIOGRAM REPORT   Patient Name:   SALIHA SALTS Date of Exam: 10/11/2019 Medical Rec #:  [id number]       Height:       63.0 in Accession #:    [id number]      Weight:       235.0 lb Date of Birth:  11-Jun-1950        BSA:          2.071 m Patient Age:    68 years        BP:           120/61 mmHg Patient Gender: F               HR:           60 bpm. Exam Location:  [facility] Procedure: 2D Echo, Color Doppler, Cardiac Doppler and Intracardiac            Opacification Agent Indications:     I50.31 CHF-Acute Diastolic  History:         Patient has prior history of Echocardiogram examinations.                  Non-obstructive CAD, Arrythmias:Paroxysmal afib; Risk                  Factors:Sleep Apnea, Hypertension and HCL.  Sonographer:     Charmayne Sheer RDCS (AE) Referring Phys:  [id number] [place] Diagnosing Phys: Ida Rogue MD  Sonographer Comments: Technically difficult study due to poor echo windows and no parasternal window. Image acquisition challenging due to patient body habitus. IMPRESSIONS  1. Left ventricular ejection fraction, by estimation, is 55 to 60%. The left ventricle has normal function. The left ventricle has no regional wall motion abnormalities. Left ventricular diastolic parameters were normal.  2. Right ventricular systolic  function is normal. The right ventricular size is normal. Tricuspid regurgitation signal is inadequate for assessing PA pressure.  3. Challenging image quality FINDINGS  Left Ventricle: Left ventricular ejection fraction, by estimation, is 55 to 60%. The left ventricle has normal function. The left ventricle has no regional wall motion abnormalities. Definity contrast agent was given IV to delineate the left ventricular  endocardial borders. The left ventricular internal cavity size was normal in size. There is no left ventricular hypertrophy. Left ventricular diastolic parameters were normal. Right Ventricle: The right ventricular size is normal. No increase in right ventricular wall thickness. Right ventricular systolic function is normal. Tricuspid regurgitation signal is inadequate for assessing PA pressure. Left Atrium: Left atrial size was normal in size. Right Atrium: Right atrial size was normal in size. Pericardium: There is no evidence of pericardial effusion. Mitral Valve: The mitral valve is normal in structure. Normal mobility of the mitral valve leaflets. No evidence of mitral valve regurgitation. No evidence of mitral valve stenosis. MV peak gradient, 5.8 mmHg. The mean mitral valve gradient is 2.0 mmHg. Tricuspid Valve: The tricuspid valve is normal in structure. Tricuspid valve regurgitation is not demonstrated. No evidence of tricuspid stenosis. Aortic Valve: The aortic valve is normal in structure. Aortic valve regurgitation is not visualized. No aortic stenosis is present. Aortic valve mean gradient measures 9.0 mmHg. Aortic valve peak gradient measures 18.1 mmHg. Pulmonic Valve: The pulmonic valve was normal in structure. Pulmonic valve regurgitation is not visualized. No evidence of pulmonic stenosis. Aorta: The aortic root is normal in size and structure. Venous: The inferior vena cava is normal in size with greater than 50% respiratory variability, suggesting right atrial pressure of 3 mmHg.  IAS/Shunts: No atrial level shunt detected by color flow Doppler.   LV Volumes (MOD) LV vol d, MOD A4C: 128.0 ml Diastology LV vol s, MOD A4C: 64.2 ml  LV e' lateral:   7.83 cm/s LV SV MOD A4C:     128.0 ml LV E/e' lateral: 13.9                             LV e' medial:    8.27 cm/s                             LV E/e' medial:  13.2  AORTIC VALVE AV Vmax:           213.00 cm/s AV Vmean:          132.000 cm/s AV VTI:            0.414 m AV Peak Grad:      18.1 mmHg AV Mean Grad:      9.0 mmHg LVOT Vmax:         120.00 cm/s LVOT Vmean:        76.600 cm/s LVOT VTI:          0.253 m LVOT/AV VTI ratio: 0.61 MITRAL VALVE MV Area (PHT): 2.16 cm     SHUNTS MV Peak grad:  5.8 mmHg  Systemic VTI: 0.25 m MV Mean grad:  2.0 mmHg MV Vmax:       1.20 m/s MV Vmean:      60.3 cm/s MV Decel Time: 352 msec MV E velocity: 109.00 cm/s MV A velocity: 63.50 cm/s MV E/A ratio:  1.72 Julien Nordmann MD Electronically signed by Julien Nordmann MD Signature Date/Time: 10/11/2019/4:52:56 PM    Final    US ABDOMEN LIMITED RUQ  Result Date: 10/11/2019 CLINICAL DATA:  Elevated LFTs EXAM: ULTRASOUND ABDOMEN LIMITED RIGHT UPPER QUADRANT COMPARISON:  None. FINDINGS: Gallbladder: Layering calcified gallstones are present the largest measuring 1.8 cm. No gallbladder wall thickening. No sonographic Murphy sign noted by sonographer. Common bile duct: Diameter: 4.6 mm Liver: Increased echotexture seen throughout. No focal abnormality or biliary ductal dilatation. Portal vein is patent on color Doppler imaging with normal direction of blood flow towards the liver. Other: A small right pleural effusion is present. IMPRESSION: Cholelithiasis without evidence of acute cholecystitis. Hepatic steatosis Small right pleural effusion Electronically Signed   By: Jonna Clark M.D.   On: 10/11/2019 02:47    Positive ROS: All other systems have been reviewed and were otherwise negative with the exception of those mentioned in the HPI and as above.  Physical  Exam: General:  Alert, no acute distress Psychiatric:  Patient is competent for consent with normal mood and affect   Cardiovascular:  No pedal edema Respiratory:  No wheezing, non-labored breathing GI:  Abdomen is soft and non-tender Skin:  No lesions in the area of chief complaint Neurologic:  Sensation intact distally Lymphatic:  No axillary or cervical lymphadenopathy  Orthopedic Exam:  Orthopedic examination is limited to the right foot and lower leg.  The splint and dressing was removed to permit evaluation of her surgical wound.  There is moderate resolving ecchymosis around the foot and lower leg, but the wound itself appears to be healing well.  There is some dried blood along the incision site, but no erythema or drainage is noted around the wound.  The skin of the foot and lower leg is wrinkling, suggesting that her postoperative/post injury swelling is resolving.  She is able to wiggle her toes and has intact sensation to light touch to all distributions.  She has good capillary refill to her toes.  X-rays:  No new x-rays have been obtained.  Assessment: Status post ORIF of unstable right distal fibular fracture.  Plan: Based on the patient's examination findings, I do not feel that the surgical wound is a source of her E. coli sepsis.  The patient is placed into a cam walker boot.  She may remove the boot for hygienic purposes, but otherwise is to keep the boot on at all times.  She is to remain nonweightbearing in the boot, but may be mobilized for transfers with physical therapy.  The patient has an appointment to follow-up in our office next week for staple removal.  If she is still here Monday, we may remove the staples at that time so and save her the trip back to the office.  Thank you for asking me to participate in the care of this most pleasant yet unfortunate woman.  We will follow her hospitalization peripherally.   Maryagnes Amos, MD  Beeper #:  [phone]  10/12/2019 7:18 PM

## 2019-10-13 ENCOUNTER — Inpatient Hospital Stay: Payer: Medicare Other

## 2019-10-13 DIAGNOSIS — I4819 Other persistent atrial fibrillation: Secondary | ICD-10-CM

## 2019-10-13 LAB — BASIC METABOLIC PANEL
Anion gap: 11 (ref 5–15)
BUN: 31 mg/dL — ABNORMAL HIGH (ref 8–23)
CO2: 23 mmol/L (ref 22–32)
Calcium: 8.2 mg/dL — ABNORMAL LOW (ref 8.9–10.3)
Chloride: 100 mmol/L (ref 98–111)
Creatinine, Ser: 1.53 mg/dL — ABNORMAL HIGH (ref 0.44–1.00)
GFR calc Af Amer: 40 mL/min — ABNORMAL LOW (ref >60)
GFR calc non Af Amer: 35 mL/min — ABNORMAL LOW (ref >60)
Glucose, Bld: 87 mg/dL (ref 70–99)
Potassium: 3.9 mmol/L (ref 3.5–5.1)
Sodium: 134 mmol/L — ABNORMAL LOW (ref 135–145)

## 2019-10-13 LAB — CBC WITH DIFFERENTIAL/PLATELET
Abs Immature Granulocytes: 0.03 10*3/uL (ref 0.00–0.07)
Basophils Absolute: 0 10*3/uL (ref 0.0–0.1)
Basophils Relative: 0 %
Eosinophils Absolute: 0.1 10*3/uL (ref 0.0–0.5)
Eosinophils Relative: 2 %
HCT: 28.1 % — ABNORMAL LOW (ref 36.0–46.0)
Hemoglobin: 9.1 g/dL — ABNORMAL LOW (ref 12.0–15.0)
Immature Granulocytes: 0 %
Lymphocytes Relative: 5 %
Lymphs Abs: 0.3 10*3/uL — ABNORMAL LOW (ref 0.7–4.0)
MCH: 28.9 pg (ref 26.0–34.0)
MCHC: 32.4 g/dL (ref 30.0–36.0)
MCV: 89.2 fL (ref 80.0–100.0)
Monocytes Absolute: 0.3 10*3/uL (ref 0.1–1.0)
Monocytes Relative: 4 %
Neutro Abs: 6.1 10*3/uL (ref 1.7–7.7)
Neutrophils Relative %: 89 %
Platelets: 228 10*3/uL (ref 150–400)
RBC: 3.15 MIL/uL — ABNORMAL LOW (ref 3.87–5.11)
RDW: 16.3 % — ABNORMAL HIGH (ref 11.5–15.5)
WBC: 6.9 10*3/uL (ref 4.0–10.5)
nRBC: 0 % (ref 0.0–0.2)

## 2019-10-13 LAB — CULTURE, BLOOD (ROUTINE X 2)
Special Requests: ADEQUATE
Special Requests: ADEQUATE

## 2019-10-13 LAB — MAGNESIUM: Magnesium: 1.8 mg/dL (ref 1.7–2.4)

## 2019-10-13 MED ORDER — IOHEXOL 9 MG/ML PO SOLN
500.0000 mL | ORAL | Status: AC
  Administered 2019-10-13 (×2): 500 mL via ORAL

## 2019-10-13 MED ORDER — IOHEXOL 9 MG/ML PO SOLN: 500.0000 mL | Freq: Once | ORAL | Status: DC | PRN

## 2019-10-13 MED ORDER — SODIUM CHLORIDE 0.9 % IV SOLN
2.0000 g | INTRAVENOUS | Status: DC
  Administered 2019-10-13 – 2019-10-14 (×2): 2 g via INTRAVENOUS
  Filled 2019-10-13: qty 2
  Filled 2019-10-13: qty 20
  Filled 2019-10-13: qty 2

## 2019-10-13 NOTE — Progress Notes (Signed)
PROGRESS NOTE    Stacey [name]  QRF:[id number] DOB: November 21, 1950 DOA: 10/11/2019 PCP: Idelle Crouch, MD (Confirm with patient/family/NH records and if not entered, this HAS to be entered at [facility] point of entry. "No PCP" if truly none.)   Brief Narrative:  Stacey [name] a 69 y.o.femalewith medical history significant ofhypertension, hyperlipidemia, GERD, hypothyroidism, anxiety, PAF on Eliquis, OSA on CPAP, CAD, drug-induced torsade de pointes, morbid obesity,dCHF, who presents withSOB. Patient was recently hospitalized from 6/7-6/10 due to right ankle fracture. Patient is s/p of ORIF on 10/01/2019. Patient states that her surgical site has beenhealing well.  In the emergency room, she had oxygen saturation of 86%, was placed on 3 L oxygen.  X-ray showed cardiomegaly with interstitial pulmonary edema.  BMP was elevated at 1800.  Right upper outer quadrant ultrasound showed no evidence of acute cholecystitis. Patient was admitted to the hospital for acute on chronic diastolic congestive heart failure, was started on IV Lasix. She also has positive blood culture with E. coli, currently treated with IV meropenem.  However, patient has normal UA, no evidence of pneumonia.  But her procalcitonin level was elevated at 6.08.  6/19.  Patient is seen by orthopedics, no evidence of infection in the right ankle area.  Obtain CT scan to rule out diverticulitis per recommendation from infective disease.  Final culture results came back with E. coli, antibiotic changed to Rocephin.   Assessment & Plan:   Principal Problem:   Acute on chronic diastolic CHF (congestive heart failure) (HCC) Active Problems:   GERD (gastroesophageal reflux disease)   Atrial fibrillation (HCC)   Coronary artery disease   Essential hypertension   Hypothyroidism   CKD (chronic kidney disease), stage IIIa   HLD (hyperlipidemia)   Acute respiratory failure with hypoxia (HCC)   Abnormal LFTs   Hypokalemia    Hypomagnesemia   Elevated lactic acid level   Bacteremia due to Gram-negative bacteria  #1.  E. coli bacteremia. Final culture result came back, antibiotic changed to Rocephin.  I reviewed patient EKG performed on 10/11/2019, significant prolongation of QT interval.  Will avoid Cipro.  Etiology of bacteremia still unclear.  Does not seem to have any infection in the ankle.  Will obtain CT scan to rule out diverticulitis per recommendation from infectious disease.  #2. Acute on chronic diastolic congestive heart failure. Condition much improved.  Lasix changed to oral yesterday.  Patient continued to well.  3.  Atrial fibrillation. Condition stable.  Continue anticoagulation.  4.  Essential hypertension. Stable.  5.  Elevated liver enzyme most likely secondary to steatohepatitis. Right upper quadrant ultrasound did not show any obstruction.  6.  Acute kidney injury on chronic kidney disease stage IIIa. Renal function back to baseline.  7.  Acute hypoxemic respiratory failure. Secondary to exacerbation congestive heart failure.  Patient off oxygen today.   DVT prophylaxis: Eliquis Code Status: Full Family Communication: None Disposition Plan:   Patient came from: SNF  Anticipated d/c place:SNF  Barriers to d/c OR conditions which need to be met to effect a safe d/c:   Consultants:   ID  Procedures: None Antimicrobials:  Meropenem.   Subjective: Patient doing well today.  Denies any short of breath or cough.  No dysuria hematuria.  No fever or chills.  No diarrhea constipation.  No back pain.  Objective: Vitals:   10/12/19 1616 10/12/19 1923 10/13/19 0440 10/13/19 0816  BP: (!) 117/48 (!) 126/57 138/70 (!) 147/63  Pulse: (!) 58 65 68 62  Resp: _0 Temp: 98 F (36.7 C) 98.6 F (37 C) 100.2 F (37.9 C) 98.4 F (36.9 C)  TempSrc:   Oral Oral Oral  SpO2: 100% 100% 93% 93%  Weight:   112.4 kg   Height:        Intake/Output Summary (Last 24 hours) at 10/13/2019 1121 Last data filed at 10/13/2019 1034 Gross per 24 hour  Intake 1040 ml  Output 4450 ml  Net -3410 ml   Filed Weights   10/11/19 2203 10/12/19 0452 10/13/19 0440  Weight: 113.1 kg 114.2 kg 112.4 kg    Examination:  General exam: Appears calm and comfortable  Respiratory system: Clear to auscultation. Respiratory effort normal. Cardiovascular system: Irregular. No JVD, murmurs, rubs, gallops or clicks. No pedal edema. Gastrointestinal system: Abdomen is nondistended, soft and nontender. No organomegaly or masses felt. Normal bowel sounds heard. Central nervous system: Alert and oriented. No focal neurological deficits. Extremities: Symmetric  Skin: No rashes, lesions or ulcers Psychiatry: Judgement and insight appear normal. Mood & affect appropriate.   No tenderness in spine   Data Reviewed: I have personally reviewed following labs and imaging studies  CBC: Recent Labs  Lab 10/11/19 0026 10/13/19 0626  WBC 4.3 6.9  NEUTROABS 4.2 6.1  HGB 10.2* 9.1*  HCT 33.0* 28.1*  MCV 94.3 89.2  PLT 284 774   Basic Metabolic Panel: Recent Labs  Lab 10/11/19 0026 10/11/19 0318 10/12/19 0550 10/13/19 0626  NA 142  --  139 134*  K 3.3*  --  4.9 3.9  CL 104  --  106 100  CO2 26  --  24 23  GLUCOSE 98  --  90 87  BUN 19  --  36* 31*  CREATININE 1.30*  --  1.91* 1.53*  CALCIUM 9.2  --  8.2* 8.2*  MG  --  1.3* 2.1 1.8   GFR: Estimated Creatinine Clearance: 43 mL/min (A) (by C-G formula based on SCr of 1.53 mg/dL (H)). Liver Function Tests: Recent Labs  Lab 10/11/19 0026  AST 243*  ALT 113*  ALKPHOS 262*  BILITOT 3.1*  PROT 7.5  ALBUMIN 3.4*   No results for input(s): LIPASE, AMYLASE in the last 168 hours. No results for input(s): AMMONIA in the last 168 hours. Coagulation Profile: Recent Labs  Lab 10/11/19 0026  INR 1.5*    Cardiac Enzymes: No results for input(s): CKTOTAL, CKMB, CKMBINDEX, TROPONINI in the last 168 hours. BNP (last 3 results) No results for input(s): PROBNP in the last 8760 hours. HbA1C: No results for input(s): HGBA1C in the last 72 hours. CBG: Recent Labs  Lab 10/12/19 1219 10/12/19 1615  GLUCAP 102* 101*   Lipid Profile: No results for input(s): CHOL, HDL, LDLCALC, TRIG, CHOLHDL, LDLDIRECT in the last 72 hours. Thyroid Function Tests: Recent Labs    10/11/19 0318  TSH 0.530   Anemia Panel: No results for input(s): VITAMINB12, FOLATE, FERRITIN, TIBC, IRON, RETICCTPCT in the  last 72 hours. Sepsis Labs: Recent Labs  Lab 10/11/19 0026 10/11/19 1234 10/11/19 1814  PROCALCITON 6.08  --   --   LATICACIDVEN 2.2* 2.4* 1.2    Recent Results (from the past 240 hour(s))  SARS Coronavirus 2 by RT PCR (hospital order, performed in [facility] hospital lab) Nasopharyngeal Nasopharyngeal Swab     Status: None   Collection Time: 10/03/19  3:12 PM   Specimen: Nasopharyngeal Swab  Result Value Ref Range Status   SARS Coronavirus 2 NEGATIVE NEGATIVE Final    Comment: (NOTE) SARS-CoV-2 target nucleic acids are NOT DETECTED. The SARS-CoV-2 RNA is generally detectable in upper and lower respiratory specimens during the acute phase of infection. The lowest concentration of SARS-CoV-2 viral copies this assay can detect is 250 copies / mL. A negative result does not preclude SARS-CoV-2 infection and should not be used as the sole basis for treatment or other patient management decisions.  A negative result may occur with improper specimen collection / handling, submission of specimen other than nasopharyngeal swab, presence of viral mutation(s) within the areas targeted by this assay, and inadequate number of viral copies (<250 copies / mL). A negative result must be combined with clinical observations, patient history, and epidemiological information. Fact Sheet for Patients:    StrictlyIdeas.no Fact Sheet for Healthcare Providers: BankingDealers.co.za This test is not yet approved or cleared  by the [place] FDA and has been authorized for detection and/or diagnosis of SARS-CoV-2 by FDA under an Emergency Use Authorization (EUA).  This EUA will remain in effect (meaning this test can be used) for the duration of the COVID-19 declaration under Section 564(b)(1) of the Act, 21 U.S.C. section 360bbb-3(b)(1), unless the authorization is terminated or revoked sooner. Performed at [facility], [address], [place], [place] [address]   Blood Culture (routine x 2)     Status: Abnormal   Collection Time: 10/11/19 12:26 AM   Specimen: BLOOD LEFT HAND  Result Value Ref Range Status   Specimen Description   Final    BLOOD LEFT HAND Performed at [facility], [address], [place], [place] [address]    Special Requests   Final    BOTTLES DRAWN AEROBIC AND ANAEROBIC Blood Culture adequate volume Performed at [facility], [address] [place], [place] [address]    Culture  Setup Time   Final    GRAM NEGATIVE RODS IN BOTH AEROBIC AND ANAEROBIC BOTTLES CRITICAL RESULT CALLED TO, READ BACK BY AND VERIFIED WITH: KAREN HAYES AT 8469 ON 10/11/2019 [place]. Performed at [facility], [address] [place], [place] [address]    Culture (A)  Final    ESCHERICHIA COLI SUSCEPTIBILITIES PERFORMED ON PREVIOUS CULTURE WITHIN THE LAST 5 DAYS. Performed at [facility], [place] [address], [place], [place] [address]    Report Status 10/13/2019 FINAL  Final  Blood Culture (routine x 2)     Status: Abnormal   Collection Time: 10/11/19 12:26 AM   Specimen: Left Antecubital; Blood  Result Value Ref Range Status   Specimen Description   Final    LEFT ANTECUBITAL Performed at [facility], [address], [place], [place] [address]    Special Requests   Final    BOTTLES  DRAWN AEROBIC AND ANAEROBIC Blood Culture adequate volume Performed at [facility], [address], [place], [place] [address]    Culture  Setup Time   Final    GRAM NEGATIVE RODS IN BOTH  AEROBIC AND ANAEROBIC BOTTLES CRITICAL RESULT CALLED TO, READ BACK BY AND VERIFIED WITH: KAREN HAYES AT 1156 ON 10/11/2019 [facility]. Performed at [facility], [address] [place], [place] [address]    Culture ESCHERICHIA COLI (A)  Final   Report Status 10/13/2019 FINAL  Final   Organism ID, Bacteria ESCHERICHIA COLI  Final      Susceptibility   Escherichia coli - MIC*    AMPICILLIN >=32 RESISTANT Resistant     CEFAZOLIN >=64 RESISTANT Resistant     CEFEPIME <=0.12 SENSITIVE Sensitive     CEFTAZIDIME <=1 SENSITIVE Sensitive     CEFTRIAXONE 0.5 SENSITIVE Sensitive     CIPROFLOXACIN <=0.25 SENSITIVE Sensitive     GENTAMICIN <=1 SENSITIVE Sensitive     IMIPENEM <=0.25 SENSITIVE Sensitive     TRIMETH/SULFA <=20 SENSITIVE Sensitive     AMPICILLIN/SULBACTAM >=32 RESISTANT Resistant     PIP/TAZO <=4 SENSITIVE Sensitive     * ESCHERICHIA COLI  Urine culture     Status: Abnormal   Collection Time: 10/11/19 12:26 AM   Specimen: In/Out Cath Urine  Result Value Ref Range Status   Specimen Description   Final    IN/OUT CATH URINE Performed at [facility], [address] [place], [place] [address]    Special Requests   Final    NONE Performed at [facility], [address] [place], [place] [address]    Culture MULTIPLE SPECIES PRESENT, SUGGEST RECOLLECTION (A)  Final   Report Status 10/11/2019 FINAL  Final  SARS Coronavirus 2 by RT PCR (hospital order, performed in [place] hospital lab) Nasopharyngeal     Status: None   Collection Time: 10/11/19 12:26 AM   Specimen: Nasopharyngeal  Result Value Ref Range Status   SARS Coronavirus 2 NEGATIVE NEGATIVE Final    Comment: (NOTE) SARS-CoV-2 target nucleic acids are NOT DETECTED.  The SARS-CoV-2 RNA is generally  detectable in upper and lower respiratory specimens during the acute phase of infection. The lowest concentration of SARS-CoV-2 viral copies this assay can detect is 250 copies / mL. A negative result does not preclude SARS-CoV-2 infection and should not be used as the sole basis for treatment or other patient management decisions.  A negative result may occur with improper specimen collection / handling, submission of specimen other than nasopharyngeal swab, presence of viral mutation(s) within the areas targeted by this assay, and inadequate number of viral copies (<250 copies / mL). A negative result must be combined with clinical observations, patient history, and epidemiological information.  Fact Sheet for Patients:   https://www.fda.gov/media/136312/download  Fact Sheet for Healthcare Providers: https://www.fda.gov/media/136313/download  This test is not yet approved or  cleared by the [place] FDA and has been authorized for detection and/or diagnosis of SARS-CoV-2 by FDA under an Emergency Use Authorization (EUA).  This EUA will remain in effect (meaning this test can be used) for the duration of the COVID-19 declaration under Section 564(b)(1) of the Act, 21 U.S.C. section 360bbb-3(b)(1), unless the authorization is terminated or revoked sooner.  Performed at [facility], [address] [place],  [address]   Blood Culture ID Panel (Reflexed)     Status: Abnormal   Collection Time: 10/11/19 12:26 AM  Result Value Ref Range Status   Enterococcus species NOT DETECTED NOT DETECTED Final   Listeria monocytogenes NOT DETECTED NOT DETECTED Final   Staphylococcus species NOT DETECTED NOT DETECTED Final   Staphylococcus aureus (BCID) NOT DETECTED NOT DETECTED Final     Streptococcus species NOT DETECTED NOT DETECTED Final   Streptococcus agalactiae NOT DETECTED NOT DETECTED Final   Streptococcus pneumoniae NOT DETECTED NOT DETECTED Final   Streptococcus  pyogenes NOT DETECTED NOT DETECTED Final   Acinetobacter baumannii NOT DETECTED NOT DETECTED Final   Enterobacteriaceae species DETECTED (A) NOT DETECTED Final    Comment: Enterobacteriaceae represent a large family of gram-negative bacteria, not a single organism. CRITICAL RESULT CALLED TO, READ BACK BY AND VERIFIED WITH: KAREN HAYES AT 1156 ON 10/11/2019 [facility].    Enterobacter cloacae complex NOT DETECTED NOT DETECTED Final   Escherichia coli DETECTED (A) NOT DETECTED Final    Comment: CRITICAL RESULT CALLED TO, READ BACK BY AND VERIFIED WITH: KAREN HAYES AT 1156 ON 10/11/2019 [facility].    Klebsiella oxytoca NOT DETECTED NOT DETECTED Final   Klebsiella pneumoniae NOT DETECTED NOT DETECTED Final   Proteus species NOT DETECTED NOT DETECTED Final   Serratia marcescens NOT DETECTED NOT DETECTED Final   Carbapenem resistance NOT DETECTED NOT DETECTED Final   Haemophilus influenzae NOT DETECTED NOT DETECTED Final   Neisseria meningitidis NOT DETECTED NOT DETECTED Final   Pseudomonas aeruginosa NOT DETECTED NOT DETECTED Final   Candida albicans NOT DETECTED NOT DETECTED Final   Candida glabrata NOT DETECTED NOT DETECTED Final   Candida krusei NOT DETECTED NOT DETECTED Final   Candida parapsilosis NOT DETECTED NOT DETECTED Final   Candida tropicalis NOT DETECTED NOT DETECTED Final    Comment: Performed at [facility], [address] [place],  [address]         Radiology Studies: ECHOCARDIOGRAM COMPLETE  Result Date: 10/11/2019    ECHOCARDIOGRAM REPORT   Patient Name:   Mieke Ryther Date of Exam: 10/11/2019 Medical Rec #:  [id number]       Height:       63.0 in Accession #:    [id number]      Weight:       235.0 lb Date of Birth:  11/10/1950        BSA:          2.071 m Patient Age:    69 years        BP:           120/61 mmHg Patient Gender: F               HR:           60 bpm. Exam Location:  [facility] Procedure: 2D Echo, Color Doppler, Cardiac Doppler and Intracardiac             Opacification Agent Indications:     I50.31 CHF-Acute Diastolic  History:         Patient has prior history of Echocardiogram examinations.                  Non-obstructive CAD, Arrythmias:Paroxysmal afib; Risk                  Factors:Sleep Apnea, Hypertension and HCL.  Sonographer:     Joan Heiss RDCS (AE) Referring Phys:  [id number] JAN A MANSY Diagnosing Phys: Timothy Gollan MD  Sonographer Comments: Technically difficult study due to poor echo windows and no parasternal window. Image acquisition challenging due to patient body habitus. IMPRESSIONS  1. Left ventricular ejection fraction, by estimation, is 55 to 60%. The left ventricle has normal function. The left ventricle has no regional wall motion abnormalities. Left ventricular diastolic parameters were normal.  2. Right ventricular systolic function is normal. The right   ventricular size is normal. Tricuspid regurgitation signal is inadequate for assessing PA pressure.  3. Challenging image quality FINDINGS  Left Ventricle: Left ventricular ejection fraction, by estimation, is 55 to 60%. The left ventricle has normal function. The left ventricle has no regional wall motion abnormalities. Definity contrast agent was given IV to delineate the left ventricular  endocardial borders. The left ventricular internal cavity size was normal in size. There is no left ventricular hypertrophy. Left ventricular diastolic parameters were normal. Right Ventricle: The right ventricular size is normal. No increase in right ventricular wall thickness. Right ventricular systolic function is normal. Tricuspid regurgitation signal is inadequate for assessing PA pressure. Left Atrium: Left atrial size was normal in size. Right Atrium: Right atrial size was normal in size. Pericardium: There is no evidence of pericardial effusion. Mitral Valve: The mitral valve is normal in structure. Normal mobility of the mitral valve leaflets. No evidence of mitral valve regurgitation. No evidence  of mitral valve stenosis. MV peak gradient, 5.8 mmHg. The mean mitral valve gradient is 2.0 mmHg. Tricuspid Valve: The tricuspid valve is normal in structure. Tricuspid valve regurgitation is not demonstrated. No evidence of tricuspid stenosis. Aortic Valve: The aortic valve is normal in structure. Aortic valve regurgitation is not visualized. No aortic stenosis is present. Aortic valve mean gradient measures 9.0 mmHg. Aortic valve peak gradient measures 18.1 mmHg. Pulmonic Valve: The pulmonic valve was normal in structure. Pulmonic valve regurgitation is not visualized. No evidence of pulmonic stenosis. Aorta: The aortic root is normal in size and structure. Venous: The inferior vena cava is normal in size with greater than 50% respiratory variability, suggesting right atrial pressure of 3 mmHg. IAS/Shunts: No atrial level shunt detected by color flow Doppler.   LV Volumes (MOD) LV vol d, MOD A4C: 128.0 ml Diastology LV vol s, MOD A4C: 64.2 ml  LV e' lateral:   7.83 cm/s LV SV MOD A4C:     128.0 ml LV E/e' lateral: 13.9                             LV e' medial:    8.27 cm/s                             LV E/e' medial:  13.2  AORTIC VALVE AV Vmax:           213.00 cm/s AV Vmean:          132.000 cm/s AV VTI:            0.414 m AV Peak Grad:      18.1 mmHg AV Mean Grad:      9.0 mmHg LVOT Vmax:         120.00 cm/s LVOT Vmean:        76.600 cm/s LVOT VTI:          0.253 m LVOT/AV VTI ratio: 0.61 MITRAL VALVE MV Area (PHT): 2.16 cm     SHUNTS MV Peak grad:  5.8 mmHg     Systemic VTI: 0.25 m MV Mean grad:  2.0 mmHg MV Vmax:       1.20 m/s MV Vmean:      60.3 cm/s MV Decel Time: 352 msec MV E velocity: 109.00 cm/s MV A velocity: 63.50 cm/s MV E/A ratio:  1.72 Timothy Gollan MD Electronically signed by Timothy Gollan MD Signature Date/Time: 10/11/2019/4:52:56 PM      Final         Scheduled Meds: . apixaban  5 mg Oral BID  . furosemide  40 mg Oral BID  . levothyroxine  100 mcg Oral Daily  . pantoprazole  40 mg  Oral Daily  . rosuvastatin  10 mg Oral QHS  . sacubitril-valsartan  1 tablet Oral BID  . senna-docusate  2 tablet Oral BID  . sodium chloride flush  3 mL Intravenous Q12H   Continuous Infusions: . sodium chloride Stopped (10/11/19 2326)  . meropenem (MERREM) IV 1 g (10/13/19 0930)     LOS: 2 days    Time spent: 28 minutes    Dekui Zhang, MD [facility]   To contact the attending provider between 7A-7P or the covering provider during after hours 7P-7A, please log into the web site [email or web address] and access using universal [place] password for that web site. If you do not have the password, please call the hospital operator.  10/13/2019, 11:21 AM    

## 2019-10-13 NOTE — Progress Notes (Signed)
Patient requested Respiratory assist her with CPap. Respiratory helped patient with Cpap. Rounded on patient and she is resting comfortably in bed and has removed her Cpap mask. She is sleeping without any respiratory struggle. Will continue to assess her for any distress and reapply CPap if she desires.

## 2019-10-13 NOTE — Progress Notes (Signed)
Care taken over from Diane and [place], RN. VSS, no complaints from patient.

## 2019-10-14 LAB — MAGNESIUM: Magnesium: 1.6 mg/dL — ABNORMAL LOW (ref 1.7–2.4)

## 2019-10-14 LAB — HEPATIC FUNCTION PANEL
ALT: 84 U/L — ABNORMAL HIGH (ref 0–44)
AST: 64 U/L — ABNORMAL HIGH (ref 15–41)
Albumin: 2.7 g/dL — ABNORMAL LOW (ref 3.5–5.0)
Alkaline Phosphatase: 368 U/L — ABNORMAL HIGH (ref 38–126)
Bilirubin, Direct: 0.8 mg/dL — ABNORMAL HIGH (ref 0.0–0.2)
Indirect Bilirubin: 0.8 mg/dL (ref 0.3–0.9)
Total Bilirubin: 1.6 mg/dL — ABNORMAL HIGH (ref 0.3–1.2)
Total Protein: 6.8 g/dL (ref 6.5–8.1)

## 2019-10-14 LAB — BASIC METABOLIC PANEL
Anion gap: 11 (ref 5–15)
BUN: 26 mg/dL — ABNORMAL HIGH (ref 8–23)
CO2: 26 mmol/L (ref 22–32)
Calcium: 8.6 mg/dL — ABNORMAL LOW (ref 8.9–10.3)
Chloride: 96 mmol/L — ABNORMAL LOW (ref 98–111)
Creatinine, Ser: 1.21 mg/dL — ABNORMAL HIGH (ref 0.44–1.00)
GFR calc Af Amer: 54 mL/min — ABNORMAL LOW (ref >60)
GFR calc non Af Amer: 46 mL/min — ABNORMAL LOW (ref >60)
Glucose, Bld: 92 mg/dL (ref 70–99)
Potassium: 4.1 mmol/L (ref 3.5–5.1)
Sodium: 133 mmol/L — ABNORMAL LOW (ref 135–145)

## 2019-10-14 MED ORDER — MAGNESIUM SULFATE 2 GM/50ML IV SOLN
2.0000 g | Freq: Once | INTRAVENOUS | Status: AC
  Administered 2019-10-14: 2 g via INTRAVENOUS
  Filled 2019-10-14: qty 50

## 2019-10-14 NOTE — Progress Notes (Signed)
PROGRESS NOTE    Stacey [name]  NID:[id number] DOB: June 11, 1950 DOA: 10/11/2019 PCP: Idelle Crouch, MD   Chief complaint shortness of breath.  Brief Narrative: (Start on day 1 of progress note - keep it brief and live) Stacey [name] a 68 y.o.femalewith medical history significant ofhypertension, hyperlipidemia, GERD, hypothyroidism, anxiety, PAF on Eliquis, OSA on CPAP, CAD, drug-induced torsade de pointes, morbid obesity,dCHF, who presents withSOB. Patient was recently hospitalized from 6/7-6/10 due to right ankle fracture. Patient is s/p of ORIF on 10/01/2019. Patient states that her surgical site has beenhealing well. In the emergency room, she had oxygen saturation of 86%, was placed on 3 L oxygen. X-ray showed cardiomegaly with interstitial pulmonary edema. BMP was elevated at 1800. Right upper outer quadrant ultrasound showed no evidence of acute cholecystitis. Patient was admitted to the hospital for acute on chronic diastolic congestive heart failure, was started on IV Lasix. She also has positive blood culture with E. coli, currently treated with IV meropenem. However, patient has normal UA, no evidence of pneumonia. But her procalcitonin level was elevated at 6.08.  6/19.  Patient is seen by orthopedics, no evidence of infection in the right ankle area.  Obtain CT scan to rule out diverticulitis per recommendation from infective disease.  Final culture results came back with E. coli, antibiotic changed to Rocephin.  2/20.  CT abdomen/pelvis did not show any evidence of diverticulitis or abscess.   Assessment & Plan:   Principal Problem:   Acute on chronic diastolic CHF (congestive heart failure) (HCC) Active Problems:   GERD (gastroesophageal reflux disease)   Atrial fibrillation (HCC)   Coronary artery disease   Essential hypertension   Hypothyroidism   CKD (chronic kidney disease), stage IIIa   HLD (hyperlipidemia)   Acute respiratory failure with  hypoxia (HCC)   Abnormal LFTs   Hypokalemia   Hypomagnesemia   Elevated lactic acid level   Bacteremia due to Gram-negative bacteria  #1.  E. coli bacteremia. Continue Rocephin for today.  Will place a PICC line tomorrow for IV antibiotics.  2.  Acute on chronic diastolic congestive heart failure. Condition improved.  Continue oral diuretics.  #3.  Atrial fibrillation. Heart rate under control.  Continue anticoagulation.  #4.  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  5.  Acute kidney injury on chronic kidney disease stage IIIa. Condition stable.  6.  Acute hypoxemic respiratory failure. Condition improved.  7.  Hypomagnesemia. Supplemented.   DVT prophylaxis:Eliquis Code Status:Full Family Communication:None Disposition Plan:  Patient came from:SNF  Anticipated d/c place:SNF  Barriers to d/c OR conditions which need to be met to effect a safe d/c:   Consultants:  ID  Procedures:None Antimicrobials: Rocephin     Subjective: Patient doing well well today.  No short of breath.  Able to walk without assist. No nausea vomiting abdominal pain. No fever or chills. No diarrhea or constipation.  Objective: Vitals:   10/14/19 0403 10/14/19 0430 10/14/19 0825 10/14/19 0826  BP:   (!) 141/67   Pulse:   64   Resp:    20  Temp:  98.9 F (37.2 C) 98.9 F (37.2 C)   TempSrc:  Oral Oral   SpO2:      Weight: 109.7 kg     Height:        Intake/Output Summary (Last 24 hours) at 10/14/2019 1151 Last data filed at 10/14/2019 1000 Gross per 24 hour  Intake 580 ml  Output 2850 ml  Net -2270 ml   Filed  Weights   10/12/19 0452 10/13/19 0440 10/14/19 0403  Weight: 114.2 kg 112.4 kg 109.7 kg    Examination:  General exam: Appears calm and comfortable  Respiratory system: Clear to auscultation.  Respiratory effort normal. Cardiovascular system: Irregular, normal rate.Marland Kitchen No JVD, murmurs, rubs, gallops or clicks. No pedal edema. Gastrointestinal system: Abdomen is nondistended, soft and nontender. No organomegaly or masses felt. Normal bowel sounds heard. Central nervous system: Alert and oriented. No focal neurological deficits. Extremities: Symmetric 5 x 5 power. Skin: No rashes, lesions or ulcers Psychiatry: Judgement and insight appear normal. Mood & affect appropriate.     Data Reviewed: I have personally reviewed following labs and imaging studies  CBC: Recent Labs  Lab 10/11/19 0026 10/13/19 0626  WBC 4.3 6.9  NEUTROABS 4.2 6.1  HGB 10.2* 9.1*  HCT 33.0* 28.1*  MCV 94.3 89.2  PLT 284 865   Basic Metabolic Panel: Recent Labs  Lab 10/11/19 0026 10/11/19 0318 10/12/19 0550 10/13/19 0626 10/14/19 0516  NA 142  --  139 134* 133*  K 3.3*  --  4.9 3.9 4.1  CL 104  --  106 100 96*  CO2 26  --  '24 23 26  '$ GLUCOSE 98  --  90 87 92  BUN 19  --  36* 31* 26*  CREATININE 1.30*  --  1.91* 1.53* 1.21*  CALCIUM 9.2  --  8.2* 8.2* 8.6*  MG  --  1.3* 2.1 1.8 1.6*   GFR: Estimated Creatinine Clearance: 53.6 mL/min (A) (by C-G formula based on SCr of 1.21 mg/dL (H)). Liver Function Tests: Recent Labs  Lab 10/11/19 0026  AST 243*  ALT 113*  ALKPHOS 262*  BILITOT 3.1*  PROT 7.5  ALBUMIN 3.4*   No results for input(s): LIPASE, AMYLASE in the last 168 hours. No results for input(s): AMMONIA in the last 168 hours. Coagulation Profile: Recent Labs  Lab 10/11/19 0026  INR 1.5*   Cardiac Enzymes: No results for input(s): CKTOTAL, CKMB, CKMBINDEX, TROPONINI in the last 168 hours. BNP (last 3 results) No results for input(s): PROBNP in the last 8760 hours. HbA1C: No results for input(s): HGBA1C in the last 72 hours. CBG: Recent Labs  Lab 10/12/19 1219 10/12/19 1615  GLUCAP 102* 101*   Lipid Profile: No results for input(s): CHOL, HDL, LDLCALC, TRIG, CHOLHDL,  LDLDIRECT in the last 72 hours. Thyroid Function Tests: No results for input(s): TSH, T4TOTAL, FREET4, T3FREE, THYROIDAB in the last 72 hours. Anemia Panel: No results for input(s): VITAMINB12, FOLATE, FERRITIN, TIBC, IRON, RETICCTPCT in the last 72 hours. Sepsis Labs: Recent Labs  Lab 10/11/19 0026 10/11/19 1234 10/11/19 1814  PROCALCITON 6.08  --   --   LATICACIDVEN 2.2* 2.4* 1.2    Recent Results (from the past 240 hour(s))  Blood Culture (routine x 2)     Status: Abnormal   Collection Time: 10/11/19 12:26 AM   Specimen: BLOOD LEFT HAND  Result Value Ref Range Status   Specimen Description   Final    BLOOD LEFT HAND Performed at [facility], [address] [place], [place] [address]    Special Requests   Final    BOTTLES DRAWN AEROBIC AND ANAEROBIC Blood Culture adequate volume Performed at [facility], [address] [place], [place] [address]    Culture  Setup Time   Final    GRAM NEGATIVE RODS IN BOTH AEROBIC AND ANAEROBIC BOTTLES CRITICAL RESULT CALLED TO, READ BACK BY AND VERIFIED WITH: KAREN HAYES AT 1156  ON 10/11/2019 [place]. Performed at [facility], [address] [place], [place] [address]    Culture (A)  Final    ESCHERICHIA COLI SUSCEPTIBILITIES PERFORMED ON PREVIOUS CULTURE WITHIN THE LAST 5 DAYS. Performed at [facility], [place] [address], [place], [place] [address]    Report Status 10/13/2019 FINAL  Final  Blood Culture (routine x 2)     Status: Abnormal   Collection Time: 10/11/19 12:26 AM   Specimen: Left Antecubital; Blood  Result Value Ref Range Status   Specimen Description   Final    LEFT ANTECUBITAL Performed at [facility], [address] [place], [place] [address]    Special Requests   Final    BOTTLES DRAWN AEROBIC AND ANAEROBIC Blood Culture adequate volume Performed at [facility], [address] [place], [place] [address]    Culture  Setup Time   Final    GRAM NEGATIVE  RODS IN BOTH AEROBIC AND ANAEROBIC BOTTLES CRITICAL RESULT CALLED TO, READ BACK BY AND VERIFIED WITH: KAREN HAYES AT 7169 ON 10/11/2019 [place]. Performed at [facility], [place] [address], [place], [place] [address]    Culture ESCHERICHIA COLI (A)  Final   Report Status 10/13/2019 FINAL  Final   Organism ID, Bacteria ESCHERICHIA COLI  Final      Susceptibility   Escherichia coli - MIC*    AMPICILLIN >=32 RESISTANT Resistant     CEFAZOLIN >=64 RESISTANT Resistant     CEFEPIME <=0.12 SENSITIVE Sensitive     CEFTAZIDIME <=1 SENSITIVE Sensitive     CEFTRIAXONE 0.5 SENSITIVE Sensitive     CIPROFLOXACIN <=0.25 SENSITIVE Sensitive     GENTAMICIN <=1 SENSITIVE Sensitive     IMIPENEM <=0.25 SENSITIVE Sensitive     TRIMETH/SULFA <=20 SENSITIVE Sensitive     AMPICILLIN/SULBACTAM >=32 RESISTANT Resistant     PIP/TAZO <=4 SENSITIVE Sensitive     * ESCHERICHIA COLI  Urine culture     Status: Abnormal   Collection Time: 10/11/19 12:26 AM   Specimen: In/Out Cath Urine  Result Value Ref Range Status   Specimen Description   Final    IN/OUT CATH URINE Performed at [facility], [address], [place], [place] [address]    Special Requests   Final    NONE Performed at [facility], [address] [place], [place] [address]    Culture MULTIPLE SPECIES PRESENT, SUGGEST RECOLLECTION (A)  Final   Report Status 10/11/2019 FINAL  Final  SARS Coronavirus 2 by RT PCR (hospital order, performed in [place] hospital lab) Nasopharyngeal     Status: None   Collection Time: 10/11/19 12:26 AM   Specimen: Nasopharyngeal  Result Value Ref Range Status   SARS Coronavirus 2 NEGATIVE NEGATIVE Final    Comment: (NOTE) SARS-CoV-2 target nucleic acids are NOT DETECTED.  The SARS-CoV-2 RNA is generally detectable in upper and lower respiratory specimens during the acute phase of infection. The lowest concentration of SARS-CoV-2 viral copies this assay can detect is 250 copies / mL. A  negative result does not preclude SARS-CoV-2 infection and should not be used as the sole basis for treatment or other patient management decisions.  A negative result may occur with improper specimen collection / handling, submission of specimen other than nasopharyngeal swab, presence of viral mutation(s) within the areas targeted by this assay, and inadequate number of viral copies (<250 copies / mL). A negative result must be combined with clinical observations, patient history, and epidemiological  information.  Fact Sheet for Patients:   StrictlyIdeas.no  Fact Sheet for Healthcare Providers: BankingDealers.co.za  This test is not yet approved or  cleared by the [place] FDA and has been authorized for detection and/or diagnosis of SARS-CoV-2 by FDA under an Emergency Use Authorization (EUA).  This EUA will remain in effect (meaning this test can be used) for the duration of the COVID-19 declaration under Section 564(b)(1) of the Act, 21 U.S.C. section 360bbb-3(b)(1), unless the authorization is terminated or revoked sooner.  Performed at [facility], [address] [place], [place] [address]   Blood Culture ID Panel (Reflexed)     Status: Abnormal   Collection Time: 10/11/19 12:26 AM  Result Value Ref Range Status   Enterococcus species NOT DETECTED NOT DETECTED Final   Listeria monocytogenes NOT DETECTED NOT DETECTED Final   Staphylococcus species NOT DETECTED NOT DETECTED Final   Staphylococcus aureus (BCID) NOT DETECTED NOT DETECTED Final   Streptococcus species NOT DETECTED NOT DETECTED Final   Streptococcus agalactiae NOT DETECTED NOT DETECTED Final   Streptococcus pneumoniae NOT DETECTED NOT DETECTED Final   Streptococcus pyogenes NOT DETECTED NOT DETECTED Final   Acinetobacter baumannii NOT DETECTED NOT DETECTED Final   Enterobacteriaceae species DETECTED (A) NOT DETECTED Final    Comment:  Enterobacteriaceae represent a large family of gram-negative bacteria, not a single organism. CRITICAL RESULT CALLED TO, READ BACK BY AND VERIFIED WITH: KAREN HAYES AT 4270 ON 10/11/2019 [place].    Enterobacter cloacae complex NOT DETECTED NOT DETECTED Final   Escherichia coli DETECTED (A) NOT DETECTED Final    Comment: CRITICAL RESULT CALLED TO, READ BACK BY AND VERIFIED WITH: KAREN HAYES AT 1156 ON 10/11/2019 [place].    Klebsiella oxytoca NOT DETECTED NOT DETECTED Final   Klebsiella pneumoniae NOT DETECTED NOT DETECTED Final   Proteus species NOT DETECTED NOT DETECTED Final   Serratia marcescens NOT DETECTED NOT DETECTED Final   Carbapenem resistance NOT DETECTED NOT DETECTED Final   Haemophilus influenzae NOT DETECTED NOT DETECTED Final   Neisseria meningitidis NOT DETECTED NOT DETECTED Final   Pseudomonas aeruginosa NOT DETECTED NOT DETECTED Final   Candida albicans NOT DETECTED NOT DETECTED Final   Candida glabrata NOT DETECTED NOT DETECTED Final   Candida krusei NOT DETECTED NOT DETECTED Final   Candida parapsilosis NOT DETECTED NOT DETECTED Final   Candida tropicalis NOT DETECTED NOT DETECTED Final    Comment: Performed at [facility], [address] [place], [place] [address]         Radiology Studies: CT ABDOMEN PELVIS WO CONTRAST  Result Date: 10/13/2019 CLINICAL DATA:  Suspect diverticulitis. EXAM: CT ABDOMEN AND PELVIS WITHOUT CONTRAST TECHNIQUE: Multidetector CT imaging of the abdomen and pelvis was performed following the standard protocol without IV contrast. COMPARISON:  None. FINDINGS: Lower chest: Lung bases demonstrate 1 cm focal nodular opacification over the medial left lower lobe with possible subtle nodular densities adjacent the right major fissure on the most superior image. No effusion. Calcified plaque over the right coronary artery. Borderline cardiomegaly. Calcified plaque over the descending thoracic aorta. Hepatobiliary: Moderate cholelithiasis.  Liver and biliary tree are normal. Pancreas: Normal. Spleen: Normal. Adrenals/Urinary Tract: Adrenal glands are normal. Kidneys are normal size without hydronephrosis or nephrolithiasis. Ureters and bladder are normal. Stomach/Bowel: Stomach and small bowel are normal. Appendix is normal. Colon is normal. Vascular/Lymphatic: Moderate calcified plaque over the abdominal aorta which is normal in caliber. No evidence of adenopathy. Reproductive: Normal. Other: No free fluid or focal inflammatory  change. Musculoskeletal: No focal abnormality. IMPRESSION: 1.  No acute findings in the abdomen/pelvis. 2. 1 cm nodular opacity over the posteromedial left lower lobe and possible 2 mm nodule opacity adjacent the right major fissure. Recommend noncontrast chest CT on elective basis for further evaluation. 3.  Moderate cholelithiasis. 4. Aortic Atherosclerosis (ICD10-I70.0). Atherosclerotic coronary artery disease. Electronically Signed   By: Marin Olp M.D.   On: 10/13/2019 15:48        Scheduled Meds: . apixaban  5 mg Oral BID  . furosemide  40 mg Oral BID  . levothyroxine  100 mcg Oral Daily  . pantoprazole  40 mg Oral Daily  . rosuvastatin  10 mg Oral QHS  . sacubitril-valsartan  1 tablet Oral BID  . senna-docusate  2 tablet Oral BID  . sodium chloride flush  3 mL Intravenous Q12H   Continuous Infusions: . sodium chloride Stopped (10/11/19 2326)  . cefTRIAXone (ROCEPHIN)  IV Stopped (10/13/19 2130)     LOS: 3 days    Time spent: 27 minutes    Sharen Hones, MD [facility]   To contact the attending provider between 7A-7P or the covering provider during after hours 7P-7A, please log into the web site [email or web address] and access using universal [place] password for that web site. If you do not have the password, please call the hospital operator.  10/14/2019, 11:51 AM

## 2019-10-14 NOTE — Progress Notes (Signed)
Pt declining cpap. Was encouraged on previous shift to have her machine brought from home.

## 2019-10-14 NOTE — Plan of Care (Signed)

## 2019-10-15 DIAGNOSIS — R945 Abnormal results of liver function studies: Secondary | ICD-10-CM

## 2019-10-15 LAB — BASIC METABOLIC PANEL
Anion gap: 10 (ref 5–15)
BUN: 29 mg/dL — ABNORMAL HIGH (ref 8–23)
CO2: 28 mmol/L (ref 22–32)
Calcium: 8.5 mg/dL — ABNORMAL LOW (ref 8.9–10.3)
Chloride: 100 mmol/L (ref 98–111)
Creatinine, Ser: 1.18 mg/dL — ABNORMAL HIGH (ref 0.44–1.00)
GFR calc Af Amer: 55 mL/min — ABNORMAL LOW (ref >60)
GFR calc non Af Amer: 48 mL/min — ABNORMAL LOW (ref >60)
Glucose, Bld: 110 mg/dL — ABNORMAL HIGH (ref 70–99)
Potassium: 3.2 mmol/L — ABNORMAL LOW (ref 3.5–5.1)
Sodium: 138 mmol/L (ref 135–145)

## 2019-10-15 LAB — SARS CORONAVIRUS 2 BY RT PCR (HOSPITAL ORDER, PERFORMED IN ~~LOC~~ HOSPITAL LAB): SARS Coronavirus 2: NEGATIVE

## 2019-10-15 MED ORDER — MAGNESIUM SULFATE 2 GM/50ML IV SOLN
2.0000 g | Freq: Once | INTRAVENOUS | Status: AC
  Administered 2019-10-15: 2 g via INTRAVENOUS
  Filled 2019-10-15: qty 50

## 2019-10-15 MED ORDER — SULFAMETHOXAZOLE-TRIMETHOPRIM 800-160 MG PO TABS: 1.0000 | ORAL_TABLET | Freq: Two times a day (BID) | ORAL | 0 refills | Status: AC

## 2019-10-15 MED ORDER — MAGNESIUM SULFATE IN D5W 1-5 GM/100ML-% IV SOLN
1.0000 g | Freq: Once | INTRAVENOUS | Status: AC
  Administered 2019-10-15: 1 g via INTRAVENOUS
  Filled 2019-10-15: qty 100

## 2019-10-15 MED ORDER — OXYCODONE HCL 5 MG PO TABS: 5.0000 mg | ORAL_TABLET | ORAL | 0 refills | Status: DC | PRN

## 2019-10-15 MED ORDER — SULFAMETHOXAZOLE-TRIMETHOPRIM 800-160 MG PO TABS
1.0000 | ORAL_TABLET | Freq: Two times a day (BID) | ORAL | Status: DC
  Administered 2019-10-15: 1 via ORAL
  Filled 2019-10-15 (×2): qty 1

## 2019-10-15 MED ORDER — ZOLPIDEM TARTRATE 10 MG PO TABS: 5.0000 mg | ORAL_TABLET | Freq: Every evening | ORAL | 0 refills | Status: DC | PRN

## 2019-10-15 MED ORDER — MAGNESIUM SULFATE 50 % IJ SOLN: 3.0000 g | Freq: Once | INTRAVENOUS | Status: DC

## 2019-10-15 NOTE — Discharge Summary (Signed)
Physician Discharge Summary  Patient ID: Stacey [name] MRN: [id number] DOB/AGE: Aug 21, 1950 69 y.o.  Admit date: 10/11/2019 Discharge date: 10/15/2019  Admission Diagnoses:  Discharge Diagnoses:  Principal Problem:   Acute on chronic diastolic CHF (congestive heart failure) (HCC) Active Problems:   GERD (gastroesophageal reflux disease)   Atrial fibrillation (HCC)   Coronary artery disease   Essential hypertension   Hypothyroidism   CKD (chronic kidney disease), stage IIIa   HLD (hyperlipidemia)   Acute respiratory failure with hypoxia (HCC)   Abnormal LFTs   Hypokalemia   Hypomagnesemia   Elevated lactic acid level   Bacteremia due to Gram-negative bacteria   Discharged Condition: good  Hospital Course:  [name] a 69 y.o.femalewith medical history significant ofhypertension, hyperlipidemia, GERD, hypothyroidism, anxiety, PAF on Eliquis, OSA on CPAP, CAD, drug-induced torsade de pointes, morbid obesity,dCHF, who presents withSOB. Patient was recently hospitalized from 6/7-6/10 due to right ankle fracture. Patient is s/p of ORIF on 10/01/2019. Patient states that her surgical site has beenhealing well. In the emergency room, she had oxygen saturation of 86%, was placed on 3 L oxygen. X-ray showed cardiomegaly with interstitial pulmonary edema. BMP was elevated at 1800. Right upper outer quadrant ultrasound showed no evidence of acute cholecystitis. Patient was admitted to the hospital for acute on chronic diastolic congestive heart failure, was started on IV Lasix. She also has positive blood culture with E. coli, currently treated with IV meropenem. However, patient has normal UA, no evidence of pneumonia. But her procalcitonin level was elevated at 6.08.  6/19.Patient is seen by orthopedics, no evidence of infection in the right ankle area. Obtain CT scan to rule out diverticulitis per recommendation from infectivedisease. Final culture results came  back with E. coli, antibiotic changed to Rocephin.  2/20.  CT abdomen/pelvis did not show any evidence of diverticulitis or abscess.  #1.  E. coli bacteremia. Discussed with infectious disease as well as pharmacy.  Likely source of infection could be that he passed a gallstone.  Condition had improved.  Will avoid Cipro due to QT interval prolongation.  Instead, she will be treated with Bactrim DS for 7 days.  Patient will need BMP at day 7 of Bactrim treatment.  2.  Acute on chronic diastolic congestive heart failure. Condition improved.  Continue oral diuretics.  3.  Atrial fibrillation. Heart rate uncontrolled.  Continue Eliquis.  4.  Acute kidney injury on chronic kidney disease stage IIIa.  Renal function has back to baseline.  5.  Acute hypoxemic respite failure secondary to exacerbation congestive heart failure. She has been off oxygen.  6.  Hypomagnesemia.  Supplemented  7.  Cholelithiasis. Patient will be needing a general surgery follow-up as outpatient.  8.  Liver function changes with nonalcoholic steatohepatitis. Patient will need follow-up with GI as outpatient.   Consults: ID and orthopedic surgery  Significant Diagnostic Studies:   CT ABDOMEN AND PELVIS WITHOUT CONTRAST  TECHNIQUE: Multidetector CT imaging of the abdomen and pelvis was performed following the standard protocol without IV contrast.  COMPARISON:  None.  FINDINGS: Lower chest: Lung bases demonstrate 1 cm focal nodular opacification over the medial left lower lobe with possible subtle nodular densities adjacent the right major fissure on the most superior image. No effusion. Calcified plaque over the right coronary artery. Borderline cardiomegaly. Calcified plaque over the descending thoracic aorta.  Hepatobiliary: Moderate cholelithiasis. Liver and biliary tree are normal.  Pancreas: Normal.  Spleen: Normal.  Adrenals/Urinary Tract: Adrenal glands are normal. Kidneys  are normal size  without hydronephrosis or nephrolithiasis. Ureters and bladder are normal.  Stomach/Bowel: Stomach and small bowel are normal. Appendix is normal. Colon is normal.  Vascular/Lymphatic: Moderate calcified plaque over the abdominal aorta which is normal in caliber. No evidence of adenopathy.  Reproductive: Normal.  Other: No free fluid or focal inflammatory change.  Musculoskeletal: No focal abnormality.  IMPRESSION: 1.  No acute findings in the abdomen/pelvis.  2. 1 cm nodular opacity over the posteromedial left lower lobe and possible 2 mm nodule opacity adjacent the right major fissure. Recommend noncontrast chest CT on elective basis for further evaluation.  3.  Moderate cholelithiasis.  4. Aortic Atherosclerosis (ICD10-I70.0). Atherosclerotic coronary artery disease.   Electronically Signed   By: Elberta Fortis M.D.   On: 10/13/2019 15:48  ULTRASOUND ABDOMEN LIMITED RIGHT UPPER QUADRANT  COMPARISON:  None.  FINDINGS: Gallbladder:  Layering calcified gallstones are present the largest measuring 1.8 cm. No gallbladder wall thickening. No sonographic Murphy sign noted by sonographer.  Common bile duct:  Diameter: 4.6 mm  Liver:  Increased echotexture seen throughout. No focal abnormality or biliary ductal dilatation. Portal vein is patent on color Doppler imaging with normal direction of blood flow towards the liver.  Other: A small right pleural effusion is present.  IMPRESSION: Cholelithiasis without evidence of acute cholecystitis.  Hepatic steatosis  Small right pleural effusion   Electronically Signed   By: Jonna Clark M.D.   On: 10/11/2019 02:47   Treatments: Antibiotics initially with meropenem then Rocephin.  Diuretics for congestive heart failure.  Discharge Exam: Blood pressure (!) 117/55, pulse 85, temperature 98.6 F (37 C), temperature source Oral, resp. rate 20, height 5\' 3"  (1.6 m),  weight 107.1 kg, SpO2 95 %. General appearance: alert and cooperative Resp: clear to auscultation bilaterally Cardio: regular rate and rhythm, S1, S2 normal, no murmur, click, rub or gallop GI: soft, non-tender; bowel sounds normal; no masses,  no organomegaly Extremities: extremities normal, atraumatic, no cyanosis or edema  Disposition: Discharge disposition: 03-Skilled Nursing Facility       Discharge Instructions    Diet - low sodium heart healthy   Complete by: As directed    Increase activity slowly   Complete by: As directed    No wound care   Complete by: As directed      Allergies as of 10/15/2019      Reactions   Levaquin [levofloxacin In D5w] Other (See Comments)   Severe dizziness   Metoprolol Itching, Nausea And Vomiting, Rash, Other (See Comments)   Dizziness   Tikosyn [dofetilide] Other (See Comments)   Developed Torsades on Tikosyn 09/28/17   Ace Inhibitors Cough      Medication List    STOP taking these medications   amiodarone 200 MG tablet Commonly known as: PACERONE   cefTRIAXone 1 g injection Commonly known as: ROCEPHIN   ondansetron 4 MG tablet Commonly known as: ZOFRAN     TAKE these medications   acetaminophen 325 MG tablet Commonly known as: TYLENOL Take 1-2 tablets (325-650 mg total) by mouth every 6 (six) hours as needed for mild pain (pain score 1-3 or temp > 100.5). What changed:   how much to take  Another medication with the same name was removed. Continue taking this medication, and follow the directions you see here.   albuterol 0.63 MG/3ML nebulizer solution Commonly known as: ACCUNEB Take 1 ampule by nebulization every 4 (four) hours as needed for wheezing.   apixaban 5 MG Tabs tablet Commonly known as: ELIQUIS  Take 1 tablet (5 mg total) by mouth 2 (two) times daily.   carvedilol 12.5 MG tablet Commonly known as: COREG TAKE 1 TABLET(12.5 MG) BY MOUTH TWICE DAILY WITH A MEAL   diclofenac sodium 1 % Gel Commonly known  as: VOLTAREN Apply 1 application topically 3 (three) times daily as needed (knee pain).   docusate sodium 100 MG capsule Commonly known as: COLACE Take 1 capsule (100 mg total) by mouth 2 (two) times daily.   esomeprazole 40 MG capsule Commonly known as: NEXIUM TAKE 1 CAPSULE BY MOUTH EVERY MORNING What changed: when to take this   furosemide 40 MG tablet Commonly known as: LASIX Hold until PCP followup due to AKI.   ipratropium-albuterol 0.5-2.5 (3) MG/3ML Soln Commonly known as: DUONEB Take 3 mLs by nebulization every 6 (six) hours as needed.   levothyroxine 100 MCG tablet Commonly known as: SYNTHROID Take 100 mcg by mouth daily.   oxyCODONE 5 MG immediate release tablet Commonly known as: Oxy IR/ROXICODONE Take 1 tablet (5 mg total) by mouth every 4 (four) hours as needed for moderate pain. What changed: how much to take   rosuvastatin 10 MG tablet Commonly known as: CRESTOR Take 10 mg by mouth at bedtime.   saccharomyces boulardii 250 MG capsule Commonly known as: FLORASTOR Take 250 mg by mouth 2 (two) times daily.   sacubitril-valsartan 49-51 MG Commonly known as: ENTRESTO Take 1 tablet by mouth 2 (two) times daily.   sulfamethoxazole-trimethoprim 800-160 MG tablet Commonly known as: BACTRIM DS Take 1 tablet by mouth every 12 (twelve) hours for 7 days.   zolpidem 10 MG tablet Commonly known as: AMBIEN Take 0.5 tablets (5 mg total) by mouth at bedtime as needed for sleep. What changed: how much to take       Follow-up Information    [facility] Follow up on 11/05/2019.   Specialty: Cardiology Why: at 9:30am. Enter through the Medical Mall entrance Contact information: [address] [place] [address] [phone]       Marguarite Arbour, MD Follow up in 1 week(s).   Specialty: Internal Medicine Contact information: [address] [facility] [place]  [address] [phone]        Iran Ouch, MD Follow up in 2 week(s).   Specialty: Cardiology Contact information: [address] [place] [address] [phone]        Duke Salvia, MD .   Specialty: Cardiology Contact information: [address] [place] [address] [phone]        Midge Minium, MD Follow up in 2 week(s).   Specialty: Gastroenterology Contact information: [address] [place]  [place] [address] [phone]        Lynn Ito, MD Follow up in 1 week(s).   Specialty: Infectious Diseases Contact information: [address] [place] [address] [phone]             35 minutes  Signed: Marrion Coy 10/15/2019, 10:04 AM

## 2019-10-15 NOTE — Evaluation (Signed)
Physical Therapy Evaluation Patient Details Name: Da Michelle MRN: [id number] DOB: 1950/05/07 Today's Date: 10/15/2019   History of Present Illness  69 yo Female presents to ED with increased shortness of breath. She was diagnosed with acute on chronic diastolic HF. Patient also found to have E.coli infection. She is s/p fall with RLE ankle fracture, s/p ORIF on 10/01/19. Ortho consult assessed surgical site, no sign of infection. She was given a CAM boot and is supposed to wear that all the time. Patient is NWB on RLE, however PA states TTWB for transfers permissible. PMH significant: HTN, HLD, GERD, Hypothyroidism, anxiety, PAF, morbid obesity, diastolic HF, drug induced torsade de pointes;  Clinical Impression  69 yo Female reports doing well. She states that SNF rehab was going well and she was working on LE strengthening and transfers. Per chart review, Dr. Roland Rack states patient is NWB on RLE, however Ortho PA Sallee Provencal states TTWB is permissible for transfers. Patient reports she has been putting weight on her RLE for transfers since her surgery. She is supervision for bed mobility. She was able to transfer with min A but was unable to maintain TTWB. Patient keeps foot flat on floor during transfers and admits she knows she is putting too much weight through RLE but has to for safety. Vitals monitored throughout session, with Spo2/HR good response on room air. Patient lives in a 2story home with bed/bath on 2nd floor. She also has 6 steps to enter house and states that she doesn't feel like she could safely get in/out of home at this time. She would benefit from skilled PT intervention to improve strength and mobility for improved ADLs.     Follow Up Recommendations SNF;Supervision/Assistance - 24 hour    Equipment Recommendations  Rolling walker with 5" wheels;3in1 (PT)    Recommendations for Other Services       Precautions / Restrictions Precautions Precautions: Fall Required Braces or  Orthoses: Other Brace Other Brace: right ankle CAM boot Restrictions Weight Bearing Restrictions: Yes RLE Weight Bearing: Non weight bearing Other Position/Activity Restrictions: Per PA, TTWB on RLE permissible during transfers;      Mobility  Bed Mobility Overal bed mobility: Needs Assistance Bed Mobility: Supine to Sit     Supine to sit: Supervision;HOB elevated     General bed mobility comments: required supervision for safety and cues for hand placement/positioning;  Transfers Overall transfer level: Needs assistance Equipment used: Rolling walker (2 wheeled) Transfers: Sit to/from [facility] Sit to Stand: Min assist Stand pivot transfers: Min assist       General transfer comment: Pt transferred sit<>Stand from bed with RW requiring min VCs for hand placement and positioning. she then pivoted to bedside chair. Patient educated on toe touch weight bearing per PA guidelines, however she was unable to maintain often putting partial weight through RLE with boot flat on floor. Pt verbalized that she knows she is putting too much weight through her foot but that is what she did at rehab and that is what she needs to do in order to transfer.  Ambulation/Gait             General Gait Details: unable at this time;  Stairs            Wheelchair Mobility    Modified Rankin (Stroke Patients Only)       Balance Overall balance assessment: Needs assistance Sitting-balance support: No upper extremity supported Sitting balance-Leahy Scale: Good     Standing balance support: Bilateral  upper extremity supported;During functional activity Standing balance-Leahy Scale: Fair Standing balance comment: requries BUE support with walker and min A for standing balance;                             Pertinent Vitals/Pain Pain Assessment: 0-10 Pain Score: 2  Pain Location: right ankle Pain Intervention(s): Limited activity within patient's  tolerance;Repositioned    Home Living Family/patient expects to be discharged to:: Skilled nursing facility Living Arrangements: Spouse/significant other Available Help at Discharge: Family   Home Access: Stairs to enter Entrance Stairs-Rails: Left;Right (unable to reach both;) Entrance Stairs-Number of Steps: 6 Home Layout: 1/2 bath on main level;Bed/bath upstairs;Two level Home Equipment: None      Prior Function Level of Independence: Independent         Comments: Pt with some chronic R>L knee OA pain but able to run errands, etc     Hand Dominance        Extremity/Trunk Assessment   Upper Extremity Assessment Upper Extremity Assessment: Generalized weakness    Lower Extremity Assessment Lower Extremity Assessment: LLE deficits/detail RLE: Unable to fully assess due to pain;Unable to fully assess due to immobilization LLE Deficits / Details: grossly 4/5, intact light touch sensation; LLE Sensation: WNL LLE Coordination: WNL    Cervical / Trunk Assessment Cervical / Trunk Assessment: Normal  Communication   Communication: No difficulties  Cognition Arousal/Alertness: Awake/alert Behavior During Therapy: WFL for tasks assessed/performed Overall Cognitive Status: Within Functional Limits for tasks assessed                                 General Comments: Pt is A and O x 4 and agreeable and motivated for PT session      General Comments      Exercises Other Exercises Other Exercises: patient seated in recliner with BLE elevated; educated patient in LE exercise with LLE ankle pump and SLR; patient wearing boot on RLE; Patient verbalized understanding (x3 min)   Assessment/Plan    PT Assessment Patient needs continued PT services  PT Problem List Decreased strength;Decreased range of motion;Decreased activity tolerance;Decreased balance;Decreased mobility;Decreased coordination;Decreased knowledge of use of DME;Decreased safety  awareness;Pain;Cardiopulmonary status limiting activity       PT Treatment Interventions Therapeutic exercise;Gait training;Balance training;Stair training;Neuromuscular re-education;Functional mobility training;Therapeutic activities;Patient/family education    PT Goals (Current goals can be found in the Care Plan section)  Acute Rehab PT Goals Patient Stated Goal: go back to rehab and get to walking better PT Goal Formulation: With patient Time For Goal Achievement: 10/29/19 Potential to Achieve Goals: Good    Frequency Min 2X/week   Barriers to discharge Inaccessible home environment has 6 steps to enter (unable to reach both rails); bed/bath on 2nd floor of home;    Co-evaluation               AM-PAC PT "6 Clicks" Mobility  Outcome Measure Help needed turning from your back to your side while in a flat bed without using bedrails?: A Little Help needed moving from lying on your back to sitting on the side of a flat bed without using bedrails?: A Little Help needed moving to and from a bed to a chair (including a wheelchair)?: A Little Help needed standing up from a chair using your arms (e.g., wheelchair or bedside chair)?: A Little Help needed to walk in hospital room?: Total  Help needed climbing 3-5 steps with a railing? : Total 6 Click Score: 14    End of Session Equipment Utilized During Treatment: Gait belt Activity Tolerance: Patient tolerated treatment well;No increased pain Patient left: in chair;with call bell/phone within reach;with chair alarm set Nurse Communication: Mobility status PT Visit Diagnosis: Muscle weakness (generalized) (M62.81);Difficulty in walking, not elsewhere classified (R26.2)    Time: 4970-2637 PT Time Calculation (min) (ACUTE ONLY): 29 min   Charges:   PT Evaluation $PT Eval Low Complexity: 1 Low           Jlyn Cerros PT, DPT 10/15/2019, 11:03 AM

## 2019-10-15 NOTE — Progress Notes (Signed)
Report given to outside facility. Patiient transferred via EMS. In stable condition.

## 2019-10-15 NOTE — Care Management Important Message (Signed)
Important Message  Patient Details  Name: Stacey [name] MRN: [id number] Date of Birth: 1950/05/31   Medicare Important Message Given:  Yes     Olegario Messier A [name] 10/15/2019, 10:58 AM

## 2019-10-15 NOTE — Plan of Care (Signed)
  Problem: Education: Goal: Knowledge of General Education information will improve Description: Including pain rating scale, medication(s)/side effects and non-pharmacologic comfort measures Outcome: Progressing   Problem: Health Behavior/Discharge Planning: Goal: Ability to manage health-related needs will improve Outcome: Progressing   Problem: Clinical Measurements: Goal: Will remain free from infection Outcome: Progressing   Problem: Pain Managment: Goal: General experience of comfort will improve Outcome: Progressing   Problem: Safety: Goal: Ability to remain free from injury will improve Outcome: Progressing   

## 2019-10-15 NOTE — Consult Note (Signed)
Wyline Mood , MD [address], [address], [place], [place], [address] [address], [place], [place], [address] Phone: [phone]  Fax: [phone]  Consultation  Referring Provider:     Dr Chipper Herb Primary Care Physician:  Marguarite Arbour, MD Primary Gastroenterologist:  None Reason for Consultation:     Elevated bilirubin  Date of Admission:  10/11/2019 Date of Consultation:  10/15/2019         HPI:   Stacey [name] is a 69 y.o. female with a past medical history of hypertension, GERD, hypothyroidism, paroxysmal atrial fibrillation Eliquis, sleep apnea on CPAP, morbid obesity presented to the hospital with shortness of breath on 10/11/2019.  Admitted with acute respiratory failure and hypoxia secondary to diastolic dysfunction.  Commenced on IV Lasix.  In addition was found to have abnormal LFTs.  No recent LFTs to compare with but LFTs in 2019 were completely normal.  And in October 2020 were also completely normal.   This admission alkaline phosphatase 262, ALT 113, AST 243 and total bilirubin of 3.1.  INR 1.5, hemoglobin of 10.2 g.  Blood culture positive for E. coli.  BNP was elevated at 1800.  Hepatitis panel negative lactic acid level is elevated at 2.4 on 10/11/2019.  When labs were checked yesterday AST, ALT were improving at 64 and 84.  Alkaline phosphatase was still elevated at 368 and a total bilirubin of 1.6.  Right upper quadrant ultrasound on 10/11/2019 demonstrated cholelithiasis, hepatic steatosis small right pleural effusion and a common bile duct of 4.6 mm.  The patient underwent a CT scan of the abdomen and pelvis on 10/13/2019 which showed no acute findings.  She underwent an echocardiogram on 10/11/2019 showed left ventricular ejection fraction of 55 to 60% right ventricular systolic function was normal, No evidence of tricuspid valve regurgitation.  She absolutely denies any abdominal pain, nausea related to food intake in the past.  During this admission she  says she has never had any abdominal pain.  Today she feels well hoping to go home.  She cannot undergo an MRCP she says due to issues with anxiety. Past Medical History:  Diagnosis Date  . Anxiety   . Arthritis    "knees" (09/26/2017)  . Chronic diastolic CHF (congestive heart failure) (HCC)    a. Dx 08/2013 in setting of rapid afib;  b. 08/2013 Echo: EF 55-60%, mildly dil LA, nl RV.  Marland Kitchen Drug-induced torsades de pointes 10/06/2017  . GERD (gastroesophageal reflux disease)   . High cholesterol   . History of blood transfusion 1975   "related to ectopic pregnancy  . Hypertension   . Non-obstructive CAD    a. 10/2013 Myoview: anteroseptal, apical, septal mild ischemia, nl LV fxn;  b. Cath: LAD 29m LCX 20ost, RCA 10m.  . Obesity   . OSA on CPAP   . Paroxysmal atrial fibrillation (HCC)    a. Dx 08/2013. CHA2DS2VASc = 3 (diast chf, htn, female) -->Xarelto initiated; b. 09/2013 s/p DCCV;  c. 10/2013 recurrent afib->Flecainide added->S/P DCCV;  d. 11/2013 Recurrent afib while off of flecainide (2/2 abnl nuc study)-->Flecainide resumed after nl cath-->DCCV;  e. Recurrent Afib 01/2014-->Flecainide increased to 150mg  BID.  Pneumonia    "1-2 times" (09/26/2017)    Past Surgical History:  Procedure Laterality Date  . BREAST BIOPSY Right 2007   stereo-neg  . CARDIAC CATHETERIZATION  10/2013   [facility]  . CARDIOVERSION N/A 08/29/2017   Procedure: CARDIOVERSION;  Surgeon: 10/29/2017, MD;  Location: [facility] ORS;  Service: Cardiovascular;  Laterality: N/A;  . CARDIOVERSION     "I've had a total of 4" (09/26/2017)  . CARDIOVERSION N/A 12/08/2018   Procedure: CARDIOVERSION;  Surgeon: Minna Merritts, MD;  Location: [facility] ORS;  Service: Cardiovascular;  Laterality: N/A;  . Stevens  . ORIF ANKLE FRACTURE Right 10/01/2019   Procedure: OPEN REDUCTION INTERNAL FIXATION (ORIF) ANKLE FRACTURE;  Surgeon: Corky Mull, MD;  Location: [facility] ORS;  Service: Orthopedics;  Laterality: Right;  .  TONSILLECTOMY      Prior to Admission medications   Medication Sig Start Date End Date Taking? Authorizing Provider  acetaminophen (TYLENOL) 325 MG tablet Take 1-2 tablets (325-650 mg total) by mouth every 6 (six) hours as needed for mild pain (pain score 1-3 or temp > 100.5). Patient taking differently: Take 325 mg by mouth every 6 (six) hours as needed for mild pain (pain score 1-3 or temp > 100.5).  10/04/19  Yes Enzo Bi, MD  acetaminophen (TYLENOL) 325 MG tablet Take 650 mg by mouth every 8 (eight) hours.   Yes [provider]  albuterol (ACCUNEB) 0.63 MG/3ML nebulizer solution Take 1 ampule by nebulization every 4 (four) hours as needed for wheezing.   Yes [provider]  amiodarone (PACERONE) 200 MG tablet TAKE 1 TABLET(200 MG) BY MOUTH DAILY 01/02/19  Yes Deboraha Sprang, MD  apixaban (ELIQUIS) 5 MG TABS tablet Take 1 tablet (5 mg total) by mouth 2 (two) times daily. 05/18/19  Yes Deboraha Sprang, MD  carvedilol (COREG) 12.5 MG tablet TAKE 1 TABLET(12.5 MG) BY MOUTH TWICE DAILY WITH A MEAL 04/10/19  Yes Deboraha Sprang, MD  cefTRIAXone (ROCEPHIN) 1 g injection Inject 1 g into the muscle once.   Yes [provider]  diclofenac sodium (VOLTAREN) 1 % GEL Apply 1 application topically 3 (three) times daily as needed (knee pain).   Yes [provider]  docusate sodium (COLACE) 100 MG capsule Take 1 capsule (100 mg total) by mouth 2 (two) times daily. 10/04/19  Yes Enzo Bi, MD  esomeprazole (NEXIUM) 40 MG capsule TAKE 1 CAPSULE BY MOUTH EVERY MORNING Patient taking differently: Take 40 mg by mouth daily.  12/15/17  Yes Deboraha Sprang, MD  ipratropium-albuterol (DUONEB) 0.5-2.5 (3) MG/3ML SOLN Take 3 mLs by nebulization every 6 (six) hours as needed.   Yes [provider]  levothyroxine (SYNTHROID) 100 MCG tablet Take 100 mcg by mouth daily.  10/02/18  Yes [provider]  ondansetron (ZOFRAN) 4 MG tablet Take 4 mg by mouth 2 (two) times daily as  needed for nausea or vomiting.   Yes [provider]  rosuvastatin (CRESTOR) 10 MG tablet Take 10 mg by mouth at bedtime. 09/29/19  Yes [provider]  saccharomyces boulardii (FLORASTOR) 250 MG capsule Take 250 mg by mouth 2 (two) times daily.   Yes [provider]  sacubitril-valsartan (ENTRESTO) 49-51 MG Take 1 tablet by mouth 2 (two) times daily.   Yes [provider]  furosemide (LASIX) 40 MG tablet Hold until PCP followup due to AKI. Patient not taking: Reported on 10/11/2019 10/04/19   Enzo Bi, MD  oxyCODONE (OXY IR/ROXICODONE) 5 MG immediate release tablet Take 1 tablet (5 mg total) by mouth every 4 (four) hours as needed for moderate pain. 10/15/19   Sharen Hones, MD  sulfamethoxazole-trimethoprim (BACTRIM DS) 800-160 MG tablet Take 1 tablet by mouth every 12 (twelve) hours for 7 days. 10/15/19 10/22/19  Sharen Hones, MD  zolpidem (AMBIEN) 10 MG tablet Take 0.5 tablets (5 mg total) by mouth at bedtime as needed for sleep. 10/15/19   Marrion Coy, MD    Family History  Problem Relation Age of Onset  . CAD Father        s/p cabg - ? h/o afib.  Marland Kitchen Heart attack Father   . Hypertension Mother   . Breast cancer Mother 13     Social History   Tobacco Use  . Smoking status: Never Smoker  . Smokeless tobacco: Never Used  Vaping Use  . Vaping Use: Never used  Substance Use Topics  . Alcohol use: Yes    Alcohol/week: 7.0 standard drinks    Types: 7 Glasses of wine per week    Comment: 1 glass of wine/day.  . Drug use: Never    Allergies as of 10/11/2019 - Review Complete 10/11/2019  Allergen Reaction Noted  . Levaquin [levofloxacin in d5w] Other (See Comments) 09/06/2013  . Metoprolol Itching, Nausea And Vomiting, Rash, and Other (See Comments) 02/11/2014  . Tikosyn [dofetilide] Other (See Comments) 09/29/2017  . Ace inhibitors Cough 09/29/2017    Review of Systems:    All systems reviewed and negative except where noted in HPI.   Physical  Exam:  Vital signs in last 24 hours: Temp:  [98.1 F (36.7 C)-98.6 F (37 C)] 98.1 F (36.7 C) (06/21 1223) Pulse Rate:  [52-85] 61 (06/21 1223) Resp:  [18-20] 18 (06/21 1223) BP: (117-135)/(55-65) 135/65 (06/21 1223) SpO2:  [95 %-100 %] 98 % (06/21 1223) Weight:  [107.1 kg] 107.1 kg (06/21 0347) Last BM Date: 10/14/19 General:   Pleasant, cooperative in NAD Head:  Normocephalic and atraumatic. Eyes:   No icterus.   Conjunctiva pink. PERRLA. Ears:  Normal auditory acuity. Heart:  Regular rate and rhythm;  Without murmur, clicks, rubs or gallops Abdomen:  Soft, nondistended, nontender. Normal bowel sounds. No appreciable masses or hepatomegaly.  No rebound or guarding.  Neurologic:  Alert and oriented x3;  grossly normal neurologically. Psych:  Alert and cooperative. Normal affect.  LAB RESULTS: Recent Labs    10/13/19 0626  WBC 6.9  HGB 9.1*  HCT 28.1*  PLT 228   BMET Recent Labs    10/13/19 0626 10/14/19 0516 10/15/19 0631  NA 134* 133* 138  K 3.9 4.1 3.2*  CL 100 96* 100  CO2 23 26 28   GLUCOSE 87 92 110*  BUN 31* 26* 29*  CREATININE 1.53* 1.21* 1.18*  CALCIUM 8.2* 8.6* 8.5*   LFT Recent Labs    10/14/19 0516  PROT 6.8  ALBUMIN 2.7*  AST 64*  ALT 84*  ALKPHOS 368*  BILITOT 1.6*  BILIDIR 0.8*  IBILI 0.8   PT/INR No results for input(s): LABPROT, INR in the last 72 hours.  STUDIES: CT ABDOMEN PELVIS WO CONTRAST  Result Date: 10/13/2019 CLINICAL DATA:  Suspect diverticulitis. EXAM: CT ABDOMEN AND PELVIS WITHOUT CONTRAST TECHNIQUE: Multidetector CT imaging of the abdomen and pelvis was performed following the standard protocol without IV contrast. COMPARISON:  None. FINDINGS: Lower chest: Lung bases demonstrate 1 cm focal nodular opacification over the medial left lower lobe with possible subtle nodular densities adjacent the right major fissure on the most superior image. No effusion. Calcified plaque over the right coronary artery. Borderline  cardiomegaly. Calcified plaque over the descending thoracic aorta. Hepatobiliary: Moderate cholelithiasis. Liver and biliary tree are normal. Pancreas: Normal. Spleen: Normal. Adrenals/Urinary Tract: Adrenal glands are normal. Kidneys are normal size without hydronephrosis or nephrolithiasis. Ureters  and bladder are normal. Stomach/Bowel: Stomach and small bowel are normal. Appendix is normal. Colon is normal. Vascular/Lymphatic: Moderate calcified plaque over the abdominal aorta which is normal in caliber. No evidence of adenopathy. Reproductive: Normal. Other: No free fluid or focal inflammatory change. Musculoskeletal: No focal abnormality. IMPRESSION: 1.  No acute findings in the abdomen/pelvis. 2. 1 cm nodular opacity over the posteromedial left lower lobe and possible 2 mm nodule opacity adjacent the right major fissure. Recommend noncontrast chest CT on elective basis for further evaluation. 3.  Moderate cholelithiasis. 4. Aortic Atherosclerosis (ICD10-I70.0). Atherosclerotic coronary artery disease. Electronically Signed   By: Elberta Fortis M.D.   On: 10/13/2019 15:48      Impression / Plan:   Stacey [name] is a 69 y.o. y/o female with admitted with acute hypoxic respiratory failure secondary to heart failure.  I have been consulted for elevated liver function tests which have been improving.  She also was admitted with E. coli bacteremia.  Denies any abdominal pain.  Common bile duct diameter is normal.  The differentials for the elevated liver function tests include sepsis from the E. coli versus congestive hepatopathy versus passage of a gallstone.  The likelihood of this being gallbladder related is lower in the differentials as she absolutely has no history of abdominal pain.  It could also be possible that it may be a combination of the above.  Unfortunately she cannot undergo an MRCP due to anxiety issues and would not be safe to perform a EUS at this point of time due to her heart failure  which she is recovering from. One  would not perform an ERCP directly due to the low pretest probability of choledocholithiasis hence I suggest that she get discharged and have her liver function test checked in 5 to 7 days time.  If completely resolved then can watch if not resolved and will discuss options at that time.  I also suggested to have a follow-up visit with me which can be done virtually.  She has also had an ankle fracture which could lead to persistent elevation of the alkaline phosphatase.  Thank you for involving me in the care of this patient.      LOS: 4 days   Wyline Mood, MD  10/15/2019, 12:29 PM

## 2019-10-15 NOTE — TOC Transition Note (Signed)
Transition of Care [facility]) - CM/SW Discharge Note   Patient Details  Name: Stacey [name] MRN: [id number] Date of Birth: February 24, 1951  Transition of Care [facility]) CM/SW Contact:  Shawn Route, RN Phone Number: 10/15/2019, 10:55 AM   Clinical Narrative:    Patient to discharge to [facility] today via [facility].  Covid test ordered, waiting on results.  Dr Chipper Herb would like to wait on MRCP to be completed before discharge, but discharge order is in .Marland Kitchen  In formation passed to bedside nurse. DC packet placed on chart, will arrange EMS once patients test completed.    Final next level of care: Skilled Nursing Facility Barriers to Discharge: Barriers Resolved   Patient Goals and CMS Choice Patient states their goals for this hospitalization and ongoing recovery are:: Return to SNF for rehab CMS Medicare.gov Compare Post Acute Care list provided to:: Patient Choice offered to / list presented to : Patient  Discharge Placement              Patient chooses bed at: [facility] Patient to be transferred to facility by: [facility] Name of family member notified: patient Patient and family notified of of transfer: 10/15/19  Discharge Plan and Services                                     Social Determinants of Health (SDOH) Interventions     Readmission Risk Interventions No flowsheet data found.

## 2019-10-15 NOTE — Discharge Instructions (Signed)
With PCP in 1 week. Follow-up with her cardiology in 2 weeks Follow-up with infectious disease in 1 week Follow-up with GI in 2 to 3 weeks Follow-up with general surgery in 2 to 3 weeks for cholelithiasis. Check BMP in 7 days.

## 2019-10-15 NOTE — NC FL2 (Signed)
[place] MEDICAID FL2 LEVEL OF CARE SCREENING TOOL     IDENTIFICATION  Patient Name: Stacey [name] Birthdate: [birth year] Sex: female Admission Date (Current Location): 10/11/2019  [place] and [place] Number:  Chiropodist and Address:  [facility], [address] [place], [place] [address]      Provider Number: [id number]  Attending Physician Name and Address:  Marrion Coy, MD  Relative Name and Phone Number:  Ples Specter [phone]    Current Level of Care: Hospital Recommended Level of Care: Skilled Nursing Facility Prior Approval Number:    Date Approved/Denied:   PASRR Number: [id number]  Discharge Plan: SNF    Current Diagnoses: Patient Active Problem List   Diagnosis Date Noted  . Acute CHF (congestive heart failure) (HCC) 10/11/2019  . Bacteremia due to Gram-negative bacteria 10/11/2019  . HLD (hyperlipidemia)   . Acute on chronic diastolic CHF (congestive heart failure) (HCC)   . Acute respiratory failure with hypoxia (HCC)   . Abnormal LFTs   . Hypokalemia   . Hypomagnesemia   . Elevated lactic acid level   . Elevated LFTs   . Hypothyroidism 10/01/2019  . Normocytic anemia 10/01/2019  . CKD (chronic kidney disease), stage IIIa 10/01/2019  . Closed fracture of right distal fibula 10/01/2019  . Morbid obesity with BMI of 40.0-44.9, adult (HCC) 10/01/2019  . Preop cardiovascular exam   . Drug-induced torsades de pointes 10/06/2017  . Persistent atrial fibrillation (HCC) 09/26/2017  . Essential hypertension 09/16/2014  . Sleep apnea 12/20/2013  . Coronary artery disease   . Chronic diastolic CHF (congestive heart failure) (HCC)   . Obesity   . GERD (gastroesophageal reflux disease)   . Hypertensive cardiovascular disease   . Depression   . Presumed Obstructive Sleep Apnea   . Atrial fibrillation (HCC)     Orientation RESPIRATION BLADDER Height & Weight     Self, Time, Situation, Place  Normal Continent  Weight: 107.1 kg Height:  5\' 3"  (160 cm)  BEHAVIORAL SYMPTOMS/MOOD NEUROLOGICAL BOWEL NUTRITION STATUS      Continent Diet  AMBULATORY STATUS COMMUNICATION OF NEEDS Skin   Extensive Assist Verbally Surgical wounds                       Personal Care Assistance Level of Assistance  Bathing, Dressing           Functional Limitations Info             SPECIAL CARE FACTORS FREQUENCY  PT (By licensed PT), OT (By licensed OT)     PT Frequency: 5x per week OT Frequency: 3x per week            Contractures Contractures Info: Not present    Additional Factors Info  Allergies   Allergies Info: Levaquin, metoprolol, ace inhibitors,           Current Medications (10/15/2019):  This is the current hospital active medication list Current Facility-Administered Medications  Medication Dose Route Frequency Provider Last Rate Last Admin  . 0.9 %  sodium chloride infusion  250 mL Intravenous PRN Mansy, 10/17/2019, MD   Stopped at 10/14/19 2144  . albuterol (PROVENTIL) (2.5 MG/3ML) 0.083% nebulizer solution 2.5 mg  2.5 mg Nebulization Q4H PRN 2145, MD      . apixaban Lorretta Harp) tablet 5 mg  5 mg Oral BID Mansy, Jan A, MD   5 mg at 10/15/19 10/17/19  . cefTRIAXone (ROCEPHIN) 2 g in sodium chloride 0.9 %  100 mL IVPB  2 g Intravenous Q24H Sharen Hones, MD   Stopped at 10/14/19 2118  . dextromethorphan-guaiFENesin (MUCINEX DM) 30-600 MG per 12 hr tablet 1 tablet  1 tablet Oral BID PRN Ivor Costa, MD      . diclofenac Sodium (VOLTAREN) 1 % topical gel 1 application  1 application Topical QID PRN Mansy, Jan A, MD      . furosemide (LASIX) tablet 40 mg  40 mg Oral BID Sharen Hones, MD   40 mg at 10/15/19 2706  . hydrALAZINE (APRESOLINE) injection 5 mg  5 mg Intravenous Q2H PRN Ivor Costa, MD      . levothyroxine (SYNTHROID) tablet 100 mcg  100 mcg Oral Daily Mansy, Jan A, MD   100 mcg at 10/15/19 0552  . magnesium sulfate IVPB 1 g 100 mL  1 g Intravenous Once Sharen Hones, MD 100 mL/hr at  10/15/19 0951 1 g at 10/15/19 0951  . oxyCODONE (Oxy IR/ROXICODONE) immediate release tablet 5-10 mg  5-10 mg Oral Q4H PRN Mansy, Jan A, MD   10 mg at 10/15/19 0811  . pantoprazole (PROTONIX) EC tablet 40 mg  40 mg Oral Daily Mansy, Jan A, MD   40 mg at 10/15/19 0952  . rosuvastatin (CRESTOR) tablet 10 mg  10 mg Oral QHS Mansy, Jan A, MD   10 mg at 10/14/19 2055  . sacubitril-valsartan (ENTRESTO) 49-51 mg per tablet  1 tablet Oral BID Mansy, Arvella Merles, MD   1 tablet at 10/15/19 0952  . senna-docusate (Senokot-S) tablet 2 tablet  2 tablet Oral BID Sharen Hones, MD   2 tablet at 10/12/19 0945  . sodium chloride flush (NS) 0.9 % injection 3 mL  3 mL Intravenous Q12H Mansy, Jan A, MD   3 mL at 10/14/19 2055  . sodium chloride flush (NS) 0.9 % injection 3 mL  3 mL Intravenous PRN Mansy, Jan A, MD      . zolpidem (AMBIEN) tablet 5 mg  5 mg Oral QHS PRN Mansy, Arvella Merles, MD   5 mg at 10/14/19 2054     Discharge Medications: Please see discharge summary for a list of discharge medications.  Relevant Imaging Results:  Relevant Lab Results:   Additional Information SS# [id number]  Victorino Dike, RN

## 2019-10-15 NOTE — Progress Notes (Signed)
Subjective:   Patient is two weeks s/p right ankle ORIF. Patient reports pain as mild.   Patient is well, did grow E. Coli on blood cultures however no signs of infection to the right ankle. Current plan is for PICC line placement for IV abx. Negative for chest pain and shortness of breath Fever: no Gastrointestinal:Negative for nausea and vomiting  Objective: Vital signs in last 24 hours: Temp:  [98.6 F (37 C)-98.9 F (37.2 C)] 98.6 F (37 C) (06/21 0343) Pulse Rate:  [52-64] 52 (06/21 0343) Resp:  [20] 20 (06/21 0343) BP: (117-141)/(55-67) 117/55 (06/21 0343) SpO2:  [96 %-100 %] 100 % (06/21 0343) Weight:  [107.1 kg] 107.1 kg (06/21 0347)  Intake/Output from previous day:  Intake/Output Summary (Last 24 hours) at 10/15/2019 0759 Last data filed at 10/15/2019 0600 Gross per 24 hour  Intake 1303.51 ml  Output 2310 ml  Net -1006.49 ml    Intake/Output this shift: No intake/output data recorded.  Labs: Recent Labs    10/13/19 0626  HGB 9.1*   Recent Labs    10/13/19 0626  WBC 6.9  RBC 3.15*  HCT 28.1*  PLT 228   Recent Labs    10/14/19 0516 10/15/19 0631  NA 133* 138  K 4.1 3.2*  CL 96* 100  CO2 26 28  BUN 26* 29*  CREATININE 1.21* 1.18*  GLUCOSE 92 110*  CALCIUM 8.6* 8.5*   No results for input(s): LABPT, INR in the last 72 hours.   EXAM General - Patient is Alert, Appropriate and Oriented Extremity - Mild bruising to the right ankle, no significant swelling.  No signs of infection. Dressing removed from lateral ankle, staples removed and benzoin with steri-strips placed. Placed back in CAM walker boot. Dressing/Incision - clean, dry, no drainage Motor Function - intact, moving foot and toes well on exam.  Past Medical History:  Diagnosis Date  . Anxiety   . Arthritis    "knees" (09/26/2017)  . Chronic diastolic CHF (congestive heart failure) (HCC)    a. Dx 08/2013 in setting of rapid afib;  b. 08/2013 Echo: EF 55-60%, mildly dil LA, nl RV.  Marland Kitchen  Drug-induced torsades de pointes 10/06/2017  . GERD (gastroesophageal reflux disease)   . High cholesterol   . History of blood transfusion 1975   "related to ectopic pregnancy  . Hypertension   . Non-obstructive CAD    a. 10/2013 Myoview: anteroseptal, apical, septal mild ischemia, nl LV fxn;  b. Cath: LAD 75m LCX 20ost, RCA 33m.  . Obesity   . OSA on CPAP   . Paroxysmal atrial fibrillation (HCC)    a. Dx 08/2013. CHA2DS2VASc = 3 (diast chf, htn, female) -->Xarelto initiated; b. 09/2013 s/p DCCV;  c. 10/2013 recurrent afib->Flecainide added->S/P DCCV;  d. 11/2013 Recurrent afib while off of flecainide (2/2 abnl nuc study)-->Flecainide resumed after nl cath-->DCCV;  e. Recurrent Afib 01/2014-->Flecainide increased to 150mg  BID.  Pneumonia    "1-2 times" (09/26/2017)   Assessment/Plan:    Principal Problem:   Acute on chronic diastolic CHF (congestive heart failure) (HCC) Active Problems:   GERD (gastroesophageal reflux disease)   Atrial fibrillation (HCC)   Coronary artery disease   Essential hypertension   Hypothyroidism   CKD (chronic kidney disease), stage IIIa   HLD (hyperlipidemia)   Acute respiratory failure with hypoxia (HCC)   Abnormal LFTs   Hypokalemia   Hypomagnesemia   Elevated lactic acid level   Bacteremia due to Gram-negative bacteria  Estimated body mass index  is 41.82 kg/m as calculated from the following:   Height as of this encounter: 5\' 3"  (1.6 m).   Weight as of this encounter: 107.1 kg. Advance diet Up with therapy   Staples removed from right ankle. No signs of infection to the right ankle. Continue CAM walker boot. Toe-touch to the right ankle. Follow-up with [facility] in 4 weeks.  DVT Prophylaxis - Eliquis Toe-touch to the right leg.  Stacey Maurisa Tesmer, PA-C [facility] 10/15/2019, 7:59 AM

## 2019-10-16 NOTE — Progress Notes (Addendum)
PT wil e.coli bacteremia with abnormal LFTS Unclear source CT/US showed multiple gall stones Urine had multiple species and was not processed further Leg ORIF site was okay as per Dr.Poggi and not the source So could it be a passing gall stone or UTI the source for bacteremia She did not have any urinary symptoms and only had nausea and no abdominal pain After 5 days of Iv antibiotic she was discharged on 10/15/19 on PO bactrim for 7 days. Repeat LFT and BMP needed when on bactrim

## 2019-10-24 ENCOUNTER — Ambulatory Visit: Payer: Medicare Other | Admitting: Nurse Practitioner

## 2019-10-24 ENCOUNTER — Encounter: Payer: Self-pay | Admitting: Nurse Practitioner

## 2019-10-24 NOTE — Progress Notes (Deleted)
Office Visit    Patient Name: Stacey [name] Date of Encounter: 10/24/2019  Primary Care Provider:  Marguarite Arbour, MD Primary Cardiologist:  [name], MD  Chief Complaint    69 year old female with a history of paroxysmal atrial fibrillation on Eliquis, hypertension, hyperlipidemia, GERD, hypothyroidism, anxiety, obstructive sleep apnea on CPAP, nonobstructive CAD, drug-induced torsade the pointes (Tikosyn-09/2017), and obesity, who presents for follow-up after recent hospitalization for HFpEF and E. coli bacteremia.  Past Medical History    Past Medical History:  Diagnosis Date  . Anxiety   . Arthritis    "knees" (09/26/2017)  . Chronic diastolic CHF (congestive heart failure) (HCC)    a. Dx 08/2013 in setting of rapid afib;  b. 08/2013 Echo: EF 55-60%, mildly dil LA, nl RV; c. 09/2019 Echo: EF 55-60%, no rwma.  . Drug-induced torsades de pointes 10/06/2017  . GERD (gastroesophageal reflux disease)   . High cholesterol   . History of blood transfusion 1975   "related to ectopic pregnancy  . Hypertension   . Non-obstructive CAD    a. 10/2013 Myoview: anteroseptal, apical, septal mild ischemia, nl LV fxn;  b. Cath: LAD 53m LCX 20ost, RCA 31m.  . Obesity   . OSA on CPAP   . Paroxysmal atrial fibrillation (HCC)    a. Dx 08/2013. CHA2DS2VASc = 3 (diast chf, htn, female) -->Xarelto initiated; b. 09/2013 s/p DCCV;  c. 10/2013 recurrent afib->Flecainide added->S/P DCCV;  d. 11/2013 Recurrent afib while off of flecainide (2/2 abnl nuc study)-->Flecainide resumed after nl cath-->DCCV;  e. Recurrent Afib 01/2014-->Flecainide increased to 150mg  BID.  Pneumonia    "1-2 times" (09/26/2017)   Past Surgical History:  Procedure Laterality Date  . BREAST BIOPSY Right 2007   stereo-neg  . CARDIAC CATHETERIZATION  10/2013   [facility]  . CARDIOVERSION N/A 08/29/2017   Procedure: CARDIOVERSION;  Surgeon: 10/29/2017, MD;  Location: [facility];  Service: Cardiovascular;  Laterality: N/A;  .  CARDIOVERSION     "I've had a total of 4" (09/26/2017)  . CARDIOVERSION N/A 12/08/2018   Procedure: CARDIOVERSION;  Surgeon: 12/10/2018, MD;  Location: [facility];  Service: Cardiovascular;  Laterality: N/A;  . ECTOPIC PREGNANCY SURGERY  1975  . ORIF ANKLE FRACTURE Right 10/01/2019   Procedure: OPEN REDUCTION INTERNAL FIXATION (ORIF) ANKLE FRACTURE;  Surgeon: 12/01/2019, MD;  Location: [facility];  Service: Orthopedics;  Laterality: Right;  . TONSILLECTOMY      Allergies  Allergies  Allergen Reactions  . Levaquin [Levofloxacin In D5w] Other (See Comments)    Severe dizziness   . Metoprolol Itching, Nausea And Vomiting, Rash and Other (See Comments)    Dizziness  . Tikosyn [Dofetilide] Other (See Comments)    Developed Torsades on Tikosyn 09/28/17  . Ace Inhibitors Cough    History of Present Illness    69 year old female with the above complex past medical history including paroxysmal atrial fibrillation, hypertension, hyperlipidemia, GERD, hypothyroidism, anxiety, obstructive sleep apnea, nonobstructive CAD, drug-induced torsade the pointes 79 2019), obesity, and HFrEF.  Home Medications    Prior to Admission medications   Medication Sig Start Date End Date Taking? Authorizing Provider  acetaminophen (TYLENOL) 325 MG tablet Take 1-2 tablets (325-650 mg total) by mouth every 6 (six) hours as needed for mild pain (pain score 1-3 or temp > 100.5). Patient taking differently: Take 325 mg by mouth every 6 (six) hours as needed for mild pain (pain score 1-3 or temp > 100.5).  10/04/19  Darlin Priestly, MD  albuterol (ACCUNEB) 0.63 MG/3ML nebulizer solution Take 1 ampule by nebulization every 4 (four) hours as needed for wheezing.    [provider]  apixaban (ELIQUIS) 5 MG TABS tablet Take 1 tablet (5 mg total) by mouth 2 (two) times daily. 05/18/19   Duke Salvia, MD  carvedilol (COREG) 12.5 MG tablet TAKE 1 TABLET(12.5 MG) BY MOUTH TWICE DAILY WITH A MEAL 04/10/19    Duke Salvia, MD  diclofenac sodium (VOLTAREN) 1 % GEL Apply 1 application topically 3 (three) times daily as needed (knee pain).    [provider]  docusate sodium (COLACE) 100 MG capsule Take 1 capsule (100 mg total) by mouth 2 (two) times daily. 10/04/19   Darlin Priestly, MD  esomeprazole (NEXIUM) 40 MG capsule TAKE 1 CAPSULE BY MOUTH EVERY MORNING Patient taking differently: Take 40 mg by mouth daily.  12/15/17   Duke Salvia, MD  furosemide (LASIX) 40 MG tablet Hold until PCP followup due to AKI. Patient not taking: Reported on 10/11/2019 10/04/19   Darlin Priestly, MD  ipratropium-albuterol (DUONEB) 0.5-2.5 (3) MG/3ML SOLN Take 3 mLs by nebulization every 6 (six) hours as needed.    [provider]  levothyroxine (SYNTHROID) 100 MCG tablet Take 100 mcg by mouth daily.  10/02/18   [provider]  oxyCODONE (OXY IR/ROXICODONE) 5 MG immediate release tablet Take 1 tablet (5 mg total) by mouth every 4 (four) hours as needed for moderate pain. 10/15/19   Marrion Coy, MD  rosuvastatin (CRESTOR) 10 MG tablet Take 10 mg by mouth at bedtime. 09/29/19   [provider]  saccharomyces boulardii (FLORASTOR) 250 MG capsule Take 250 mg by mouth 2 (two) times daily.    [provider]  sacubitril-valsartan (ENTRESTO) 49-51 MG Take 1 tablet by mouth 2 (two) times daily.    [provider]  zolpidem (AMBIEN) 10 MG tablet Take 0.5 tablets (5 mg total) by mouth at bedtime as needed for sleep. 10/15/19   Marrion Coy, MD    Review of Systems    ***.  All other systems reviewed and are otherwise negative except as noted above.  Physical Exam    VS:  There were no vitals taken for this visit. , BMI There is no height or weight on file to calculate BMI. GEN: Well nourished, well developed, in no acute distress. HEENT: normal. Neck: Supple, no JVD, carotid bruits, or masses. Cardiac: RRR, no murmurs, rubs, or gallops. No clubbing, cyanosis, edema.  Radials/DP/PT 2+  and equal bilaterally.  Respiratory:  Respirations regular and unlabored, clear to auscultation bilaterally. GI: Soft, nontender, nondistended, BS + x 4. MS: no deformity or atrophy. Skin: warm and dry, no rash. Neuro:  Strength and sensation are intact. Psych: Normal affect.  Accessory Clinical Findings    ECG personally reviewed by me today - *** - no acute changes.  Lab Results  Component Value Date   WBC 6.9 10/13/2019   HGB 9.1 (L) 10/13/2019   HCT 28.1 (L) 10/13/2019   MCV 89.2 10/13/2019   PLT 228 10/13/2019   Lab Results  Component Value Date   CREATININE 1.18 (H) 10/15/2019   BUN 29 (H) 10/15/2019   NA 138 10/15/2019   K 3.2 (L) 10/15/2019   CL 100 10/15/2019   CO2 28 10/15/2019   Lab Results  Component Value Date   ALT 84 (H) 10/14/2019   AST 64 (H) 10/14/2019   ALKPHOS 368 (H) 10/14/2019   BILITOT 1.6 (  H) 10/14/2019   Lab Results  Component Value Date   CHOL 194 08/28/2013   HDL 56 08/28/2013   LDLCALC 114 (H) 08/28/2013   TRIG 122 08/28/2013    No results found for: HGBA1C  Assessment & Plan    1.  ***   Nicolasa Ducking, NP 10/24/2019, 10:01 AM

## 2019-10-25 ENCOUNTER — Other Ambulatory Visit: Payer: Self-pay

## 2019-10-25 ENCOUNTER — Ambulatory Visit: Payer: No Typology Code available for payment source | Attending: Infectious Diseases | Admitting: Infectious Diseases

## 2019-10-25 ENCOUNTER — Encounter: Payer: Self-pay | Admitting: Nurse Practitioner

## 2019-10-25 ENCOUNTER — Encounter: Payer: Self-pay | Admitting: Infectious Diseases

## 2019-10-25 VITALS — BP 84/45 | HR 54 | Temp 97.7°F | Resp 16 | Ht 63.0 in | Wt 236.0 lb

## 2019-10-25 DIAGNOSIS — Z8249 Family history of ischemic heart disease and other diseases of the circulatory system: Secondary | ICD-10-CM | POA: Diagnosis not present

## 2019-10-25 DIAGNOSIS — Z881 Allergy status to other antibiotic agents status: Secondary | ICD-10-CM | POA: Insufficient documentation

## 2019-10-25 DIAGNOSIS — Z79899 Other long term (current) drug therapy: Secondary | ICD-10-CM | POA: Diagnosis not present

## 2019-10-25 DIAGNOSIS — R7881 Bacteremia: Secondary | ICD-10-CM | POA: Insufficient documentation

## 2019-10-25 DIAGNOSIS — G4733 Obstructive sleep apnea (adult) (pediatric): Secondary | ICD-10-CM | POA: Diagnosis not present

## 2019-10-25 DIAGNOSIS — Z9989 Dependence on other enabling machines and devices: Secondary | ICD-10-CM | POA: Insufficient documentation

## 2019-10-25 DIAGNOSIS — Z7901 Long term (current) use of anticoagulants: Secondary | ICD-10-CM | POA: Insufficient documentation

## 2019-10-25 DIAGNOSIS — Z791 Long term (current) use of non-steroidal anti-inflammatories (NSAID): Secondary | ICD-10-CM | POA: Insufficient documentation

## 2019-10-25 DIAGNOSIS — K219 Gastro-esophageal reflux disease without esophagitis: Secondary | ICD-10-CM | POA: Insufficient documentation

## 2019-10-25 DIAGNOSIS — F419 Anxiety disorder, unspecified: Secondary | ICD-10-CM | POA: Diagnosis not present

## 2019-10-25 DIAGNOSIS — M170 Bilateral primary osteoarthritis of knee: Secondary | ICD-10-CM | POA: Insufficient documentation

## 2019-10-25 DIAGNOSIS — K8080 Other cholelithiasis without obstruction: Secondary | ICD-10-CM | POA: Diagnosis not present

## 2019-10-25 DIAGNOSIS — I480 Paroxysmal atrial fibrillation: Secondary | ICD-10-CM | POA: Insufficient documentation

## 2019-10-25 DIAGNOSIS — B9620 Unspecified Escherichia coli [E. coli] as the cause of diseases classified elsewhere: Secondary | ICD-10-CM | POA: Diagnosis not present

## 2019-10-25 DIAGNOSIS — E039 Hypothyroidism, unspecified: Secondary | ICD-10-CM | POA: Diagnosis not present

## 2019-10-25 DIAGNOSIS — Z8701 Personal history of pneumonia (recurrent): Secondary | ICD-10-CM | POA: Insufficient documentation

## 2019-10-25 DIAGNOSIS — Z8781 Personal history of (healed) traumatic fracture: Secondary | ICD-10-CM | POA: Diagnosis not present

## 2019-10-25 DIAGNOSIS — E785 Hyperlipidemia, unspecified: Secondary | ICD-10-CM | POA: Diagnosis not present

## 2019-10-25 DIAGNOSIS — I5032 Chronic diastolic (congestive) heart failure: Secondary | ICD-10-CM | POA: Diagnosis not present

## 2019-10-25 DIAGNOSIS — N179 Acute kidney failure, unspecified: Secondary | ICD-10-CM | POA: Insufficient documentation

## 2019-10-25 DIAGNOSIS — Z888 Allergy status to other drugs, medicaments and biological substances status: Secondary | ICD-10-CM | POA: Diagnosis not present

## 2019-10-25 DIAGNOSIS — I110 Hypertensive heart disease with heart failure: Secondary | ICD-10-CM | POA: Insufficient documentation

## 2019-10-25 DIAGNOSIS — Z79890 Hormone replacement therapy (postmenopausal): Secondary | ICD-10-CM | POA: Insufficient documentation

## 2019-10-25 NOTE — Progress Notes (Signed)
NAME: Stacey [name]  DOB: October 26, 1950  MRN: [id number]  Date/Time: 10/25/2019 12:01 PM   Subjective:  Follow up after recent hospitalization 10/11/19-10/15/19 ? Stacey [name] is a 69 y.o. female with a history of hypertension, hyperlipidemia, GERD, hypothyroidism, anxiety, PAF on Eliquis, OSA on CPAP, CAD, drug-induced torsade de pointes, morbid obesity,dCHF was in the hospital for SOB . Found to have pulmonary edema and also had positive blood culture for e.coli- source was inclear but she had abnormal LFTS and US/CT scan done showed stones Gall bladder but no CBD obstruction. She did not have any GI symptoms except nausea. She was initially on IV antibiotics for 5 days and then switched to PO bactrim for 7 more days and she completed the treatment on 6/28. Pt is now in [facility] because of the rt leg fracture and ORIFshe had on 10/01/19 The surgical site has been fine. Pt has no specific complaints - except she continues to have nausea and poor appetite Temp has been 99. No pain abdomen Says she had labs done but no results with her. Also had CT abdomen . No report available   Past Medical History:  Diagnosis Date  . Anxiety   . Arthritis    "knees" (09/26/2017)  . Chronic diastolic CHF (congestive heart failure) (HCC)    a. Dx 08/2013 in setting of rapid afib;  b. 08/2013 Echo: EF 55-60%, mildly dil LA, nl RV; c. 08/2017 Echo: EF 20-25% (in afib); d. 11/2017 Echo: EF 50-55%; e. 09/2019 Echo: EF 55-60%, no rwma.  . Drug-induced torsades de pointes 10/06/2017  . GERD (gastroesophageal reflux disease)   . High cholesterol   . History of blood transfusion 1975   "related to ectopic pregnancy  . Hypertension   . Non-obstructive CAD    a. 10/2013 Myoview: anteroseptal, apical, septal mild ischemia, nl LV fxn;  b. Cath: LAD 25m LCX 20ost, RCA 18m.  . Obesity   . OSA on CPAP   . Paroxysmal atrial fibrillation (HCC)    a. Dx 08/2013. CHA2DS2VASc = 4 (Eliquis); b. Prev on flecainide w/ multiple  DCCVs; c. 09/2017 Tikosyn added-->TdP 2/2 prolonged QT->amio; d. 11/2018 s/p DCCV-amio cont.  . Pneumonia    "1-2 times" (09/26/2017)  . Tachycardia induced cardiomyopathy (HCC)    a. 08/2017 Echo: EF 20-25%, diff HK in setting of Afib w/ RVR; b. 11/2017 Echo: EF 50-55%; c. 09/2019 Echo: EF 55-60%.    Past Surgical History:  Procedure Laterality Date  . BREAST BIOPSY Right 2007   stereo-neg  . CARDIAC CATHETERIZATION  10/2013   [facility]  . CARDIOVERSION N/A 08/29/2017   Procedure: CARDIOVERSION;  Surgeon: Iran Ouch, MD;  Location: [facility];  Service: Cardiovascular;  Laterality: N/A;  . CARDIOVERSION     "I've had a total of 4" (09/26/2017)  . CARDIOVERSION N/A 12/08/2018   Procedure: CARDIOVERSION;  Surgeon: Antonieta Iba, MD;  Location: [facility];  Service: Cardiovascular;  Laterality: N/A;  . ECTOPIC PREGNANCY SURGERY  1975  . ORIF ANKLE FRACTURE Right 10/01/2019   Procedure: OPEN REDUCTION INTERNAL FIXATION (ORIF) ANKLE FRACTURE;  Surgeon: Christena Flake, MD;  Location: [facility];  Service: Orthopedics;  Laterality: Right;  . TONSILLECTOMY      Social History   Socioeconomic History  . Marital status: Married    Spouse name: Not on file  . Number of children: Not on file  . Years of education: Not on file  . Highest education level: Not on file  Occupational History  .  Not on file  Tobacco Use  . Smoking status: Never Smoker  . Smokeless tobacco: Never Used  Vaping Use  . Vaping Use: Never used  Substance and Sexual Activity  . Alcohol use: Yes    Alcohol/week: 7.0 standard drinks    Types: 7 Glasses of wine per week    Comment: 1 glass of wine/day.  . Drug use: Never  . Sexual activity: Not Currently  Other Topics Concern  . Not on file  Social History Narrative   Lives in [place].  Works as Water quality scientist.    Social Determinants of Health   Financial Resource Strain:   . Difficulty of Paying Living Expenses:   Food Insecurity:   . Worried About Brewing technologist in the Last Year:   . Barista in the Last Year:   Transportation Needs:   . Freight forwarder (Medical):   Marland Kitchen Lack of Transportation (Non-Medical):   Physical Activity:   . Days of Exercise per Week:   . Minutes of Exercise per Session:   Stress:   . Feeling of Stress :   Social Connections:   . Frequency of Communication with Friends and Family:   . Frequency of Social Gatherings with Friends and Family:   . Attends Religious Services:   . Active Member of Clubs or Organizations:   . Attends Banker Meetings:   Marland Kitchen Marital Status:   Intimate Partner Violence:   . Fear of Current or Ex-Partner:   . Emotionally Abused:   Marland Kitchen Physically Abused:   . Sexually Abused:     Family History  Problem Relation Age of Onset  . CAD Father        s/p cabg - ? h/o afib.  Marland Kitchen Heart attack Father   . Hypertension Mother   . Breast cancer Mother 83   Allergies  Allergen Reactions  . Levaquin [Levofloxacin In D5w] Other (See Comments)    Severe dizziness   . Metoprolol Itching, Nausea And Vomiting, Rash and Other (See Comments)    Dizziness  . Tikosyn [Dofetilide] Other (See Comments)    Developed Torsades on Tikosyn 09/28/17  . Ace Inhibitors Cough  . Adhesive [Tape]     ? Current Outpatient Medications  Medication Sig Dispense Refill  . acetaminophen (TYLENOL) 325 MG tablet Take 1-2 tablets (325-650 mg total) by mouth every 6 (six) hours as needed for mild pain (pain score 1-3 or temp > 100.5). (Patient taking differently: Take 325 mg by mouth every 6 (six) hours as needed for mild pain (pain score 1-3 or temp > 100.5). )    . albuterol (ACCUNEB) 0.63 MG/3ML nebulizer solution Take 1 ampule by nebulization every 4 (four) hours as needed for wheezing.    Marland Kitchen apixaban (ELIQUIS) 5 MG TABS tablet Take 1 tablet (5 mg total) by mouth 2 (two) times daily. 60 tablet 6  . carvedilol (COREG) 12.5 MG tablet TAKE 1 TABLET(12.5 MG) BY MOUTH TWICE DAILY WITH A MEAL 180 tablet 2   . diclofenac sodium (VOLTAREN) 1 % GEL Apply 1 application topically 3 (three) times daily as needed (knee pain).    Marland Kitchen docusate sodium (COLACE) 100 MG capsule Take 1 capsule (100 mg total) by mouth 2 (two) times daily. 10 capsule 0  . esomeprazole (NEXIUM) 40 MG capsule TAKE 1 CAPSULE BY MOUTH EVERY MORNING (Patient taking differently: Take 40 mg by mouth daily. ) 30 capsule 3  . furosemide (LASIX) 40 MG  tablet Hold until PCP followup due to AKI. 30 tablet 5  . HYDROcodone-acetaminophen (NORCO) 10-325 MG tablet Take 1 tablet by mouth every 6 (six) hours as needed.    Marland Kitchen ipratropium-albuterol (DUONEB) 0.5-2.5 (3) MG/3ML SOLN Take 3 mLs by nebulization every 6 (six) hours as needed.    Marland Kitchen levothyroxine (SYNTHROID) 100 MCG tablet Take 100 mcg by mouth daily.     . rosuvastatin (CRESTOR) 10 MG tablet Take 10 mg by mouth at bedtime.    . saccharomyces boulardii (FLORASTOR) 250 MG capsule Take 250 mg by mouth 2 (two) times daily.    . sacubitril-valsartan (ENTRESTO) 49-51 MG Take 1 tablet by mouth 2 (two) times daily.    Marland Kitchen zolpidem (AMBIEN) 10 MG tablet Take 0.5 tablets (5 mg total) by mouth at bedtime as needed for sleep. 12 tablet 0   No current facility-administered medications for this visit.     Abtx:  Anti-infectives (From admission, onward)   None      REVIEW OF SYSTEMS:  Const: negative fever, negative chills, negative weight loss Eyes: negative diplopia or visual changes, negative eye pain ENT: negative coryza, negative sore throat Resp: negative cough, hemoptysis, dyspnea Cards: negative for chest pain, palpitations, lower extremity edema GU: negative for frequency, dysuria and hematuria GI: Negative for abdominal pain, diarrhea, bleeding, constipation, has nausea poor appetite Skin: negative for rash and pruritus Heme: negative for easy bruising and gum/nose bleeding MS: negative for myalgias, arthralgias, back pain and muscle weakness Neurolo:negative for headaches, dizziness,  vertigo, memory problems  Psych: negative for feelings of anxiety, depression  Endocrine: hypothyroidism Allergy/Immunology-as above ( levaquin on of them- causes dizziness) Objective:  VITALS:  BP (!) 84/45   Pulse (!) 54   Temp 97.7 F (36.5 C)   Resp 16   Ht 5\' 3"  (1.6 m)   Wt 236 lb (107 kg)   SpO2 95%   BMI 41.81 kg/m  PHYSICAL EXAM:  General: Alert, cooperative, no distress, appears stated age.  Head: Normocephalic, without obvious abnormality, atraumatic. Eyes: Conjunctivae clear, anicteric sclerae. Pupils are equal ENT Nares normal. No drainage or sinus tenderness. Lips, mucosa, and tongue normal. No Thrush Neck: Supple, symmetrical, no adenopathy, thyroid: non tender no carotid bruit and no JVD. Back: No CVA tenderness. Lungs: Clear to auscultation bilaterally. No Wheezing or Rhonchi. No rales. Heart: Regular rate and rhythm, no murmur, rub or gallop. Abdomen: Soft, non-tender,not distended. Bowel sounds normal. No masses Extremities: edema left leg Rt leg in brace Skin: No rashes or lesions. Or bruising Lymph: Cervical, supraclavicular normal. Neurologic: Grossly non-focal Pertinent Labs Lab Results CBC    Component Value Date/Time   WBC 6.9 10/13/2019 0626   RBC 3.15 (L) 10/13/2019 0626   HGB 9.1 (L) 10/13/2019 0626   HGB 12.2 07/01/2015 1608   HCT 28.1 (L) 10/13/2019 0626   HCT 38.2 07/01/2015 1608   PLT 228 10/13/2019 0626   PLT 241 07/01/2015 1608   MCV 89.2 10/13/2019 0626   MCV 90 07/01/2015 1608   MCV 96 02/01/2014 1650   MCH 28.9 10/13/2019 0626   MCHC 32.4 10/13/2019 0626   RDW 16.3 (H) 10/13/2019 0626   RDW 15.8 (H) 07/01/2015 1608   RDW 17.7 (H) 02/01/2014 1650   LYMPHSABS 0.3 (L) 10/13/2019 0626   LYMPHSABS 0.9 (L) 02/01/2014 1650   MONOABS 0.3 10/13/2019 0626   MONOABS 0.5 02/01/2014 1650   EOSABS 0.1 10/13/2019 0626   EOSABS 0.2 02/01/2014 1650   BASOSABS 0.0 10/13/2019 10/15/2019  BASOSABS 0.1 02/01/2014 1650    CMP Latest Ref Rng &  Units 10/15/2019 10/14/2019 10/13/2019  Glucose 70 - 99 mg/dL 242(P) 92 87  BUN 8 - 23 mg/dL 53(I) 14(E) 31(V)  Creatinine 0.44 - 1.00 mg/dL 4.00(Q) 6.76(P) 9.50(D)  Sodium 135 - 145 mmol/L 138 133(L) 134(L)  Potassium 3.5 - 5.1 mmol/L 3.2(L) 4.1 3.9  Chloride 98 - 111 mmol/L 100 96(L) 100  CO2 22 - 32 mmol/L 28 26 23   Calcium 8.9 - 10.3 mg/dL 3.2(I) 7.1(I) 4.5(Y)  Total Protein 6.5 - 8.1 g/dL - 6.8 -  Total Bilirubin 0.3 - 1.2 mg/dL - 1.6(H) -  Alkaline Phos 38 - 126 U/L - 368(H) -  AST 15 - 41 U/L - 64(H) -  ALT 0 - 44 U/L - 84(H) -      Microbiology: No results found for this or any previous visit (from the past 240 hour(s)).  IMAGING RESULTS: I have personally reviewed the films ? Impression/Recommendation ?E.coli bacteremia- unclear source- no urinary symptoms,- but with increased lFTS including increase in alkaline phosphatase and ultrasound showing layering gall stones  cholangitis, cholecystitis suspected but patient was asymptomatic other than nausea CT abdomen did not show any CBD dilatation She completed treatment for the bacteremia- 5 days of IV followed by 7 days of PO bactrim  Recent ORIF-( 6/7) surgical site looks well as per surgeon- has a brace  Also had received b/l knee injections on 6/8 but clinically no evidence of septic knee  Afib- on eliquis  AKI on CKD- had improved No recent labs   HTn- - now running low- says it has been on the lower side- she is on entresto, coreg and frusemide- the medicines should be adjusted- talk to primary ? ? ___________________________________________________ Discussed with patient,in detail [facility]ed [facility] to fax her recent labs to me No follow up needed with me

## 2019-10-30 ENCOUNTER — Inpatient Hospital Stay: Payer: Medicare Other | Admitting: Infectious Diseases

## 2019-11-05 ENCOUNTER — Ambulatory Visit: Payer: Medicare Other | Admitting: Family

## 2019-11-05 ENCOUNTER — Other Ambulatory Visit: Payer: Self-pay

## 2019-11-13 ENCOUNTER — Ambulatory Visit: Payer: Medicare Other | Admitting: Nurse Practitioner

## 2019-11-30 ENCOUNTER — Telehealth: Payer: Self-pay | Admitting: Cardiovascular Disease

## 2019-11-30 NOTE — Telephone Encounter (Signed)
Patient calling in after noticing some bilateral ankle swelling. Patient has a right broken ankle that has just been given weight bearing status. Patient was recently in a SNF and stopped amiodarone due to low HR and BP issues. Patient wants to be advised on whether she should start it back to help with some swelling   Please advise

## 2019-11-30 NOTE — Telephone Encounter (Signed)
Spoke with patient and she states that as soon as she gets up in the morning they start to get tight. Through the day they swell bilaterally. She had been non-weight bearing but once she was able to walk this has become more noticeable. SNF stopped amiodarone for low heart rate and low blood pressure. Patient states she is back on Furosemide 40 mg once daily at this time. Primary care provider visit is August 23 rd. She does have upcoming appointment with APP here in our office 8/19. Advised that if Dr. Kirke Corin had any recommendations we would call her back and if not we would see her on the 19th with APP. She verbalized understanding, agreement with plan, and no further questions at this time.

## 2019-12-04 NOTE — Telephone Encounter (Signed)
She will have to be seen in the office for evaluation given multiple issues.  In the meantime, she can continue with leg elevation during the day and might consider knee-high support stockings to be used during the day.

## 2019-12-05 NOTE — Telephone Encounter (Signed)
Spoke with patient and reviewed provider recommendations. She verbalized understanding and has appointment next week with provider here in the office. She had no further questions at this time.

## 2019-12-13 ENCOUNTER — Ambulatory Visit (INDEPENDENT_AMBULATORY_CARE_PROVIDER_SITE_OTHER): Payer: Medicare Other | Admitting: Nurse Practitioner

## 2019-12-13 ENCOUNTER — Other Ambulatory Visit: Payer: Self-pay

## 2019-12-13 ENCOUNTER — Encounter: Payer: Self-pay | Admitting: Nurse Practitioner

## 2019-12-13 VITALS — BP 124/76 | HR 58 | Ht 63.0 in | Wt 243.0 lb

## 2019-12-13 DIAGNOSIS — R001 Bradycardia, unspecified: Secondary | ICD-10-CM | POA: Diagnosis not present

## 2019-12-13 DIAGNOSIS — I480 Paroxysmal atrial fibrillation: Secondary | ICD-10-CM | POA: Diagnosis not present

## 2019-12-13 DIAGNOSIS — I5032 Chronic diastolic (congestive) heart failure: Secondary | ICD-10-CM

## 2019-12-13 DIAGNOSIS — I10 Essential (primary) hypertension: Secondary | ICD-10-CM

## 2019-12-13 DIAGNOSIS — E782 Mixed hyperlipidemia: Secondary | ICD-10-CM

## 2019-12-13 MED ORDER — AMIODARONE HCL 200 MG PO TABS
100.0000 mg | ORAL_TABLET | Freq: Every day | ORAL | 3 refills | Status: DC
Start: 2019-12-13 — End: 2020-02-08

## 2019-12-13 MED ORDER — CARVEDILOL 3.125 MG PO TABS
3.1250 mg | ORAL_TABLET | Freq: Two times a day (BID) | ORAL | 3 refills | Status: DC
Start: 2019-12-13 — End: 2020-08-13

## 2019-12-13 NOTE — Progress Notes (Signed)
Office Visit    Patient Name: Stacey [name] Date of Encounter: 12/13/2019  Primary Care Provider:  Marguarite Arbour, MD Primary Cardiologist:  [name], MD  Chief Complaint    69 year old female with a history of paroxysmal atrial fibrillation on Eliquis, hypertension, hyperlipidemia, GERD, hypothyroidism, anxiety, obstructive sleep apnea on CPAP, nonobstructive CAD, drug-induced torsades de pointes (tikosyn 09/2017), and obesity, who presents for follow-up related to HFpEF, PAF, and recent hospitalization for E. coli bacteremia.  Past Medical History    Past Medical History:  Diagnosis Date  . Anxiety   . Arthritis    "knees" (09/26/2017)  . Bacteremia    a.09/2019 E. coli bacteremia - unclear source.  . Chronic diastolic CHF (congestive heart failure) (HCC)    a. Dx 08/2013 in setting of rapid afib;  b. 08/2013 Echo: EF 55-60%, mildly dil LA, nl RV; c. 08/2017 Echo: EF 20-25% (in afib); d. 11/2017 Echo: EF 50-55%; e. 09/2019 Echo: EF 55-60%, no rwma.  . Drug-induced torsades de pointes 10/06/2017  . GERD (gastroesophageal reflux disease)   . High cholesterol   . History of blood transfusion 1975   "related to ectopic pregnancy  . Hypertension   . Non-obstructive CAD    a. 10/2013 Myoview: anteroseptal, apical, septal mild ischemia, nl LV fxn;  b. Cath: LAD 45m LCX 20ost, RCA 12m.  . Obesity   . OSA on CPAP   . Paroxysmal atrial fibrillation (HCC)    a. Dx 08/2013. CHA2DS2VASc = 4 (Eliquis); b. Prev on flecainide w/ multiple DCCVs; c. 09/2017 Tikosyn added-->TdP 2/2 prolonged QT->amio; d. 11/2018 s/p DCCV-amio cont.  . Pneumonia    "1-2 times" (09/26/2017)  . Tachycardia induced cardiomyopathy (HCC)    a. 08/2017 Echo: EF 20-25%, diff HK in setting of Afib w/ RVR; b. 11/2017 Echo: EF 50-55%; c. 09/2019 Echo: EF 55-60%.   Past Surgical History:  Procedure Laterality Date  . BREAST BIOPSY Right 2007   stereo-neg  . CARDIAC CATHETERIZATION  10/2013   [facility]  . CARDIOVERSION N/A  08/29/2017   Procedure: CARDIOVERSION;  Surgeon: Iran Ouch, MD;  Location: [facility] ORS;  Service: Cardiovascular;  Laterality: N/A;  . CARDIOVERSION     "I've had a total of 4" (09/26/2017)  . CARDIOVERSION N/A 12/08/2018   Procedure: CARDIOVERSION;  Surgeon: Antonieta Iba, MD;  Location: [facility] ORS;  Service: Cardiovascular;  Laterality: N/A;  . ECTOPIC PREGNANCY SURGERY  1975  . ORIF ANKLE FRACTURE Right 10/01/2019   Procedure: OPEN REDUCTION INTERNAL FIXATION (ORIF) ANKLE FRACTURE;  Surgeon: Christena Flake, MD;  Location: [facility] ORS;  Service: Orthopedics;  Laterality: Right;  . TONSILLECTOMY      Allergies  Allergies  Allergen Reactions  . Levaquin [Levofloxacin In D5w] Other (See Comments)    Severe dizziness   . Metoprolol Itching, Nausea And Vomiting, Rash and Other (See Comments)    Dizziness  . Tikosyn [Dofetilide] Other (See Comments)    Developed Torsades on Tikosyn 09/28/17  . Ace Inhibitors Cough  . Adhesive [Tape]     History of Present Illness    69 year old female with above complex past medical history including paroxysmal atrial fibrillation, hypertension, hyperlipidemia GERD, hypothyroidism, anxiety, obstructive apnea, nonobstructive CAD, drug-induced torsades de pointes (Tikosyn-June 2019), obesity, and HFrEF.  Atrial fibrillation rapid ventricular spots in 2015.  She subsequently underwent diagnostic catheterization revealing nonobstructive CAD.  She required several cardioversions and had recurrent atrial fibrillation in 2019 with echocardiogram in May 2019 showing an EF of 20-25%  with diffuse hypokinesis.  Cardiomyopathy was felt to be tachycardia mediated.  She was hospitalized in June 2019 and placed on Tikosyn therapy with reversion to sinus rhythm but development of prolonged QT and torsades.  Tikosyn was discontinued and she was subsequently placed on amiodarone therapy.  With restoration of sinus rhythm, EF normalized.  She had recurrent atrial fibrillation in  August 2020 and underwent repeat cardioversion, which was successful.  In early June of this year, she presented to [facility] following a mechanical fall with finding of right ankle fracture.  She underwent successful surgical correction was subsequently discharged to rehab but then required readmission June 17 to June 21 in the setting of respiratory failure, pulmonary edema, and E. coli bacteremia.  Urinalysis was normal.  There was not felt to be any infection related to the right ankle surgery.  CT of the abdomen showed moderate cholelithiasis but no acute findings.  It was felt that infectious source could be secondary to passed gallstone.  She was treated with antibiotics.  During hospital admission, she had elevated LFTs and acute kidney injury with peak creatinine of 1.91 on June 18.  This subsequently improved.  In the setting of elevated LFTs, hypotension, bradycardia, and QT prolongation, amiodarone was held during hospitalization.  She subsequently discharged to rehab.  Since discharge, she did develop ankle swelling off of Lasix (also discontinued during hospitalization) and Lasix was resumed.  She reports having had lab work prior to discharge from rehab which she was told appeared normal.  Since her discharge from rehab, she has been doing well.  She has not had any chest pain, dyspnea, palpitations, or edema (resolved after starting back on Lasix).  She is concerned about being off of amiodarone as she developed heart failure in the past with recurrent atrial fibrillation, and would like to resume.  She denies PND, orthopnea, dizziness, syncope, or early satiety.  Home Medications    Prior to Admission medications   Medication Sig Start Date End Date Taking? Authorizing Provider  apixaban (ELIQUIS) 5 MG TABS tablet Take 1 tablet (5 mg total) by mouth 2 (two) times daily. 05/18/19  Yes Duke Salvia, MD  carvedilol (COREG) 6.25 MG tablet Take 6.25 mg by mouth 2 (two) times daily.  10/27/19  Yes [provider]  diclofenac sodium (VOLTAREN) 1 % GEL Apply 1 application topically 3 (three) times daily as needed (knee pain).   Yes [provider]  esomeprazole (NEXIUM) 40 MG capsule TAKE 1 CAPSULE BY MOUTH EVERY MORNING 12/15/17  Yes Duke Salvia, MD  furosemide (LASIX) 40 MG tablet Hold until PCP followup due to AKI. 10/04/19  Yes Darlin Priestly, MD  HYDROcodone-acetaminophen [facility]) 10-325 MG tablet Take 1 tablet by mouth every 6 (six) hours as needed.   Yes [provider]  levothyroxine (SYNTHROID) 100 MCG tablet Take 100 mcg by mouth daily.  10/02/18  Yes [provider]  rosuvastatin (CRESTOR) 10 MG tablet Take 10 mg by mouth at bedtime. 09/29/19  Yes [provider]  sacubitril-valsartan (ENTRESTO) 49-51 MG Take 1 tablet by mouth 2 (two) times daily.   Yes [provider]  zolpidem (AMBIEN) 10 MG tablet Take 10 mg by mouth at bedtime.   Yes [provider]    Review of Systems    Steadily recovering since discharge from rehab.  She denies chest pain, dyspnea, palpitations, PND, orthopnea, dizziness, syncope, edema, or early satiety.  All other systems reviewed and are otherwise negative except as noted  above.  Physical Exam    VS:  BP 124/76   Pulse (!) 58   Ht 5\' 3"  (1.6 m)   Wt 243 lb (110.2 kg)   SpO2 96%   BMI 43.05 kg/m  , BMI Body mass index is 43.05 kg/m. GEN: Well nourished, well developed, in no acute distress. HEENT: normal. Neck: Supple, no JVD, carotid bruits, or masses. Cardiac: RRR, no murmurs, rubs, or gallops. No clubbing, cyanosis, trace right ankle edema.  Radials/DP/PT 2+ and equal bilaterally.  Respiratory:  Respirations regular and unlabored, clear to auscultation bilaterally. GI: Soft, nontender, nondistended, BS + x 4. MS: no deformity or atrophy. Skin: warm and dry, no rash. Neuro:  Strength and sensation are intact. Psych: Normal affect.  Accessory Clinical Findings    ECG  personally reviewed by me today -sinus bradycardia, 58, no acute ST or T changes.  QTC is 469 - no acute changes.  Lab Results  Component Value Date   WBC 6.9 10/13/2019   HGB 9.1 (L) 10/13/2019   HCT 28.1 (L) 10/13/2019   MCV 89.2 10/13/2019   PLT 228 10/13/2019   Lab Results  Component Value Date   CREATININE 1.18 (H) 10/15/2019   BUN 29 (H) 10/15/2019   NA 138 10/15/2019   K 3.2 (L) 10/15/2019   CL 100 10/15/2019   CO2 28 10/15/2019   Lab Results  Component Value Date   ALT 84 (H) 10/14/2019   AST 64 (H) 10/14/2019   ALKPHOS 368 (H) 10/14/2019   BILITOT 1.6 (H) 10/14/2019   Lab Results  Component Value Date   CHOL 194 08/28/2013   HDL 56 08/28/2013   LDLCALC 114 (H) 08/28/2013   TRIG 122 08/28/2013    Assessment & Plan    1.  Paroxysmal atrial fibrillation: Currently maintaining sinus rhythm on beta-blocker therapy only.  She was previously managed with amiodarone but this was discontinued during recent hospitalization in the setting of bacteremia with hypotension, bradycardia, elevated LFTs, electrolyte abnormalities, prolonged QT, and acute kidney injury.  She is concerned about being off of amiodarone in the setting of prior history of recurrent A. fib with heart failure and LV dysfunction in the setting of tachycardia.  I share her concern.  She is bradycardic today at a heart rate of 58.  QTC is back to previous baseline at 469.  We discussed follow-up labs today however, she plans to follow the primary care provider next week and expects that he will draw labs.  She does not want to have to go to the hospital to have labs today.  I will resume amiodarone but at half the prior dose-100 mg daily and reduce carvedilol to 3.125 mg twice daily.  We will arrange for a follow-up twelve-lead ECG in about a week to reevaluate QTC.  Remains anticoagulated on Eliquis therapy.  2.  Essential hypertension: Stable.  Follow closely with reduction in carvedilol dosing.  3.  Chronic  HFpEF: With history of tachycardia induced cardiomyopathy.  Echocardiogram during recent hospitalization showed normal LV function-55-60%.  She was initially discharged off of Lasix in the setting of acute kidney injury but this has since been resumed with resolution of lower extremity swelling.  She appears relatively euvolemic today.  Heart rate and blood pressure stable.  Continue current doses of Entresto and furosemide.  Reducing carvedilol to 3.125 mg twice daily in the setting of bradycardia.  4.  Prolonged QT/history of torsades the pointes: History of torsades in the setting of Tikosyn therapy.  She has previously tolerated amiodarone well.  She has baseline QTC prolongation which was more prolonged in the setting of bacteremia and metabolic abnormalities during hospitalization.  Will need follow-up ECG next week after resumption of lower dose amiodarone-100 mg daily.  She also plans to have follow-up with primary care next week-we will need basic metabolic panel, LFTs, and magnesium.  5.  Hyperlipidemia: On statin therapy.  LDL was 89 in October 2020.  6.  Disposition: She prefers to have follow-up lab work with primary care next week.  Will need a follow-up ECG either at that visit or we can arrange to do in our office.  Otherwise follow-up with Dr. Kirke Corin in 4 to 6 weeks.   Nicolasa Ducking, NP 12/13/2019, 3:47 PM

## 2019-12-13 NOTE — Patient Instructions (Signed)
Medication Instructions:   - Your physician has recommended you make the following change in your medication:   1. DECREASE Carvedilol 3.125 - take one tablet (3.125) twice a day  2. START Amiodarone 200MG  - Take half tablet (100MG ) by mouth daily.    *If you need a refill on your cardiac medications before your next appointment, please call your pharmacy*   Lab Work:  1. None Ordered  If you have labs (blood work) drawn today and your tests are completely normal, you will receive your results only by: MyChart Message (if you have MyChart) OR . A paper copy in the mail If you have any lab test that is abnormal or we need to change your treatment, we will call you to review the results.   Testing/Procedures:  1. None Ordered   Follow-Up: At [facility], you and your health needs are our priority.  As part of our continuing mission to provide you with exceptional heart care, we have created designated Provider Care Teams.  These Care Teams include your primary Cardiologist (physician) and Advanced Practice Providers (APPs -  Physician Assistants and Nurse Practitioners) who all work together to provide you with the care you need, when you need it.  We recommend signing up for the patient portal called "MyChart".  Sign up information is provided on this After Visit Summary.  MyChart is used to connect with patients for Virtual Visits (Telemedicine).  Patients are able to view lab/test results, encounter notes, upcoming appointments, etc.  Non-urgent messages can be sent to your provider as well.   To learn more about what you can do with MyChart, go to Marland Kitchen.    Your next appointment:   4 - 6  week(s)  The format for your next appointment:   In Person  Provider:   [facility], MD

## 2019-12-20 ENCOUNTER — Ambulatory Visit: Payer: Medicare Other | Admitting: Gastroenterology

## 2020-02-08 ENCOUNTER — Other Ambulatory Visit: Payer: Self-pay

## 2020-02-08 ENCOUNTER — Ambulatory Visit (INDEPENDENT_AMBULATORY_CARE_PROVIDER_SITE_OTHER): Payer: Medicare Other | Admitting: Cardiovascular Disease

## 2020-02-08 ENCOUNTER — Encounter: Payer: Self-pay | Admitting: Cardiovascular Disease

## 2020-02-08 VITALS — BP 133/86 | HR 118 | Ht 63.0 in | Wt 249.0 lb

## 2020-02-08 DIAGNOSIS — I4819 Other persistent atrial fibrillation: Secondary | ICD-10-CM | POA: Diagnosis not present

## 2020-02-08 DIAGNOSIS — I5022 Chronic systolic (congestive) heart failure: Secondary | ICD-10-CM | POA: Diagnosis not present

## 2020-02-08 DIAGNOSIS — I5032 Chronic diastolic (congestive) heart failure: Secondary | ICD-10-CM | POA: Diagnosis not present

## 2020-02-08 DIAGNOSIS — I10 Essential (primary) hypertension: Secondary | ICD-10-CM

## 2020-02-08 DIAGNOSIS — R001 Bradycardia, unspecified: Secondary | ICD-10-CM

## 2020-02-08 MED ORDER — AMIODARONE HCL 200 MG PO TABS
200.0000 mg | ORAL_TABLET | Freq: Two times a day (BID) | ORAL | 0 refills | Status: DC
Start: 2020-02-08 — End: 2020-02-08

## 2020-02-08 MED ORDER — AMIODARONE HCL 200 MG PO TABS
200.0000 mg | ORAL_TABLET | Freq: Two times a day (BID) | ORAL | 0 refills | Status: DC
Start: 2020-02-08 — End: 2020-02-18

## 2020-02-08 NOTE — Addendum Note (Signed)
Addended by: Festus Aloe on: 02/08/2020 02:07 PM   Modules accepted: Orders

## 2020-02-08 NOTE — H&P (View-Only) (Signed)
Cardiology Office Note   Date:  02/08/2020   ID:  Stacey [name], DOB 1950-06-07, MRN [id number]  PCP:  Marguarite Arbour, MD  Cardiologist:   [name], MD   Chief Complaint  Patient presents with  . office visit    8 week F/U-Patient reports dizziness and frequent falls for the past 2 weeks; Meds verbally reviewed with patient.      History of Present Illness: Stacey [name] is a 69 y.o. female who presents for a follow-up visit regarding persistent atrial fibrillation and chronic diastolic heart failure. She has known history of  HTN and depression.   Cardiac cath in 2015 showed mild nonobstructive coronary artery disease. She uses a CPAP regularly for sleep apnea. She failed multiple antiarrhythmic medications including flecainide.  Dofetilide was associated with QT prolongation and had to be discontinued.   Most recent cardioversion was July of last year. She did have decreased ejection fraction in the setting of A. fib with RVR but subsequently her EF improved to 50 to 55%. She fell in June and fractured her right ankle which required surgery.  She was discharged to rehab but was readmitted due to respiratory failure, pulmonary edema and E. coli bacteremia.  CT abdomen showed cholelithiasis which might have been responsible for source of infection.  She improved with antibiotics.  During that hospitalization, she had acute kidney injury and abnormal liver enzymes.  Due to that, amiodarone was discontinued at that time.  She was discharged to rehab.  She was seen by Ward Givens in August and at that time she was noted to be in sinus bradycardia.  Amiodarone was resumed at a lower dose of 100 mg once daily and carvedilol was decreased to 3.125 mg twice daily.  The patient developed palpitations and shortness of breath about 1 to 2 weeks ago and she is noted to be in atrial fibrillation with RVR today.  No chest pain.  Past Medical History:  Diagnosis Date  . Anxiety   .  Arthritis    "knees" (09/26/2017)  . Bacteremia    a.09/2019 E. coli bacteremia - unclear source.  . Chronic diastolic CHF (congestive heart failure) (HCC)    a. Dx 08/2013 in setting of rapid afib;  b. 08/2013 Echo: EF 55-60%, mildly dil LA, nl RV; c. 08/2017 Echo: EF 20-25% (in afib); d. 11/2017 Echo: EF 50-55%; e. 09/2019 Echo: EF 55-60%, no rwma.  . 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 GERD (gastroesophageal reflux disease)   . High cholesterol   . History of blood transfusion 1975   "related to ectopic pregnancy  . Hypertension   . Non-obstructive CAD    a. 10/2013 Myoview: anteroseptal, apical, septal mild ischemia, nl LV fxn;  b. Cath: LAD 78m LCX 20ost, RCA 45m.  . Obesity   . OSA on CPAP   . Paroxysmal atrial fibrillation (HCC)    a. Dx 08/2013. CHA2DS2VASc = 4 (Eliquis); b. Prev on flecainide w/ multiple DCCVs; c. 09/2017 Tikosyn added-->TdP 2/2 prolonged QT->amio; d. 11/2018 s/p DCCV-amio cont.  . Pneumonia    "1-2 times" (09/26/2017)  . Tachycardia induced cardiomyopathy (HCC)    a. 08/2017 Echo: EF 20-25%, diff HK in setting of Afib w/ RVR; b. 11/2017 Echo: EF 50-55%; c. 09/2019 Echo: EF 55-60%.    Past Surgical History:  Procedure Laterality Date  . BREAST BIOPSY Right 2007   stereo-neg  . CARDIAC CATHETERIZATION  10/2013   [facility]  . CARDIOVERSION N/A 08/29/2017  Procedure: CARDIOVERSION;  Surgeon: Iran Ouch, MD;  Location: [facility] ORS;  Service: Cardiovascular;  Laterality: N/A;  . CARDIOVERSION     "I've had a total of 4" (09/26/2017)  . CARDIOVERSION N/A 12/08/2018   Procedure: CARDIOVERSION;  Surgeon: Antonieta Iba, MD;  Location: [facility] ORS;  Service: Cardiovascular;  Laterality: N/A;  . ECTOPIC PREGNANCY SURGERY  1975  . ORIF ANKLE FRACTURE Right 10/01/2019   Procedure: OPEN REDUCTION INTERNAL FIXATION (ORIF) ANKLE FRACTURE;  Surgeon: Christena Flake, MD;  Location: [facility] ORS;  Service: Orthopedics;  Laterality: Right;  . TONSILLECTOMY       Current Outpatient  Medications  Medication Sig Dispense Refill  . amiodarone (PACERONE) 200 MG tablet Take 0.5 tablets (100 mg total) by mouth daily. 180 tablet 3  . apixaban (ELIQUIS) 5 MG TABS tablet Take 1 tablet (5 mg total) by mouth 2 (two) times daily. 60 tablet 6  . carvedilol (COREG) 3.125 MG tablet Take 1 tablet (3.125 mg total) by mouth 2 (two) times daily. 180 tablet 3  . diclofenac sodium (VOLTAREN) 1 % GEL Apply 1 application topically 3 (three) times daily as needed (knee pain).    Marland Kitchen esomeprazole (NEXIUM) 40 MG capsule TAKE 1 CAPSULE BY MOUTH EVERY MORNING 30 capsule 3  . furosemide (LASIX) 40 MG tablet Hold until PCP followup due to AKI. 30 tablet 5  . HYDROcodone-acetaminophen (NORCO) 10-325 MG tablet Take 1 tablet by mouth every 6 (six) hours as needed.    Marland Kitchen levothyroxine (SYNTHROID) 100 MCG tablet Take 100 mcg by mouth daily.     . rosuvastatin (CRESTOR) 10 MG tablet Take 10 mg by mouth at bedtime.    . sacubitril-valsartan (ENTRESTO) 49-51 MG Take 1 tablet by mouth 2 (two) times daily.    Marland Kitchen zolpidem (AMBIEN) 10 MG tablet Take 10 mg by mouth at bedtime.     No current facility-administered medications for this visit.    Allergies:   Levaquin [levofloxacin in d5w], Metoprolol, Tikosyn [dofetilide], Ace inhibitors, and Adhesive [tape]    Social History:  The patient  reports that she has never smoked. She has never used smokeless tobacco. She reports current alcohol use of about 7.0 standard drinks of alcohol per week. She reports that she does not use drugs.   Family History:  The patient's family history includes Breast cancer (age of onset: 38) in her mother; CAD in her father; Heart attack in her father; Hypertension in her mother.    ROS:  Please see the history of present illness.   Otherwise, review of systems are positive for none.   All other systems are reviewed and negative.    PHYSICAL EXAM: VS:  BP 133/86 (BP Location: Left Arm, Patient Position: Sitting, Cuff Size: Large)    Pulse (!) 118   Ht 5\' 3"  (1.6 m)   Wt 249 lb (112.9 kg)   SpO2 95%   BMI 44.11 kg/m  , BMI Body mass index is 44.11 kg/m. GEN: Well nourished, well developed, in no acute distress  HEENT: normal  Neck: no JVD, carotid bruits, or masses Cardiac: Irregularly irregular and tachycardic.; no  rubs, or gallops,no edema . 1/6 systolic ejection murmur in the aortic area Respiratory:  clear to auscultation bilaterally, normal work of breathing GI: soft, nontender, nondistended, + BS MS: no deformity or atrophy  Skin: warm and dry, no rash Neuro:  Strength and sensation are intact Psych: euthymic mood, full affect   EKG:  EKG is ordered today. The ekg  ordered today demonstrates atrial fibrillation with ventricular rate of 118 bpm.  Poor R wave progression in the anterior leads.  Recent Labs: 10/11/2019: B Natriuretic Peptide 1,800.8; TSH 0.530 10/13/2019: Hemoglobin 9.1; Platelets 228 10/14/2019: ALT 84; Magnesium 1.6 10/15/2019: BUN 29; Creatinine, Ser 1.18; Potassium 3.2; Sodium 138    Lipid Panel    Component Value Date/Time   CHOL 194 08/28/2013 0352   TRIG 122 08/28/2013 0352   HDL 56 08/28/2013 0352   VLDL 24 08/28/2013 0352   LDLCALC 114 (H) 08/28/2013 0352      Wt Readings from Last 3 Encounters:  02/08/20 249 lb (112.9 kg)  12/13/19 243 lb (110.2 kg)  10/25/19 236 lb (107 kg)         ASSESSMENT AND PLAN:  1.  Persistent atrial fibrillation: Unfortunately, the patient went back into A. fib with RVR likely due to interruption of her amiodarone.  She has been on 100 mg once daily since August.  I am going to increase the dose to 200 mg twice daily until we proceed with cardioversion.  She is very symptomatic during atrial fibrillation and due to that, I recommend proceeding with cardioversion.  She has not missed any dose of Eliquis.  I discussed the procedure in details as well as risks and benefits.  2. Chronic systolic/diastolic heart failure: Low EF in the past in  the setting of atrial fibrillation improved to low normal at 50 to 55%.  Continue treatment with carvedilol and Entresto.  I am worried that her ejection fraction might decrease again if she remains in atrial fibrillation.  3. Obstructive sleep apnea: Doing well with CPAP.  4.  Essential hypertension: Blood pressure is controlled.  5.  Monitoring of high risk medication: She is on amiodarone.  She had labs with her primary care physician which showed normalization of LFTs and creatinine of 1.4.  She does have anemia which has been stable with hemoglobin of 9.4.   Disposition:   FU with me in 1 month.  Signed,  [name], MD  02/08/2020 10:00 AM    [facility]

## 2020-02-08 NOTE — Progress Notes (Signed)
Cardiology Office Note   Date:  02/08/2020   ID:  Stacey [name], DOB 1950-06-07, MRN [id number]  PCP:  Marguarite Arbour, MD  Cardiologist:   [name], MD   Chief Complaint  Patient presents with  . office visit    8 week F/U-Patient reports dizziness and frequent falls for the past 2 weeks; Meds verbally reviewed with patient.      History of Present Illness: Stacey [name] is a 69 y.o. female who presents for a follow-up visit regarding persistent atrial fibrillation and chronic diastolic heart failure. She has known history of  HTN and depression.   Cardiac cath in 2015 showed mild nonobstructive coronary artery disease. She uses a CPAP regularly for sleep apnea. She failed multiple antiarrhythmic medications including flecainide.  Dofetilide was associated with QT prolongation and had to be discontinued.   Most recent cardioversion was July of last year. She did have decreased ejection fraction in the setting of A. fib with RVR but subsequently her EF improved to 50 to 55%. She fell in June and fractured her right ankle which required surgery.  She was discharged to rehab but was readmitted due to respiratory failure, pulmonary edema and E. coli bacteremia.  CT abdomen showed cholelithiasis which might have been responsible for source of infection.  She improved with antibiotics.  During that hospitalization, she had acute kidney injury and abnormal liver enzymes.  Due to that, amiodarone was discontinued at that time.  She was discharged to rehab.  She was seen by Ward Givens in August and at that time she was noted to be in sinus bradycardia.  Amiodarone was resumed at a lower dose of 100 mg once daily and carvedilol was decreased to 3.125 mg twice daily.  The patient developed palpitations and shortness of breath about 1 to 2 weeks ago and she is noted to be in atrial fibrillation with RVR today.  No chest pain.  Past Medical History:  Diagnosis Date  . Anxiety   .  Arthritis    "knees" (09/26/2017)  . Bacteremia    a.09/2019 E. coli bacteremia - unclear source.  . Chronic diastolic CHF (congestive heart failure) (HCC)    a. Dx 08/2013 in setting of rapid afib;  b. 08/2013 Echo: EF 55-60%, mildly dil LA, nl RV; c. 08/2017 Echo: EF 20-25% (in afib); d. 11/2017 Echo: EF 50-55%; e. 09/2019 Echo: EF 55-60%, no rwma.  . 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 GERD (gastroesophageal reflux disease)   . High cholesterol   . History of blood transfusion 1975   "related to ectopic pregnancy  . Hypertension   . Non-obstructive CAD    a. 10/2013 Myoview: anteroseptal, apical, septal mild ischemia, nl LV fxn;  b. Cath: LAD 78m LCX 20ost, RCA 45m.  . Obesity   . OSA on CPAP   . Paroxysmal atrial fibrillation (HCC)    a. Dx 08/2013. CHA2DS2VASc = 4 (Eliquis); b. Prev on flecainide w/ multiple DCCVs; c. 09/2017 Tikosyn added-->TdP 2/2 prolonged QT->amio; d. 11/2018 s/p DCCV-amio cont.  . Pneumonia    "1-2 times" (09/26/2017)  . Tachycardia induced cardiomyopathy (HCC)    a. 08/2017 Echo: EF 20-25%, diff HK in setting of Afib w/ RVR; b. 11/2017 Echo: EF 50-55%; c. 09/2019 Echo: EF 55-60%.    Past Surgical History:  Procedure Laterality Date  . BREAST BIOPSY Right 2007   stereo-neg  . CARDIAC CATHETERIZATION  10/2013   [facility]  . CARDIOVERSION N/A 08/29/2017  Procedure: CARDIOVERSION;  Surgeon: Iran Ouch, MD;  Location: [facility] ORS;  Service: Cardiovascular;  Laterality: N/A;  . CARDIOVERSION     "I've had a total of 4" (09/26/2017)  . CARDIOVERSION N/A 12/08/2018   Procedure: CARDIOVERSION;  Surgeon: Antonieta Iba, MD;  Location: [facility] ORS;  Service: Cardiovascular;  Laterality: N/A;  . ECTOPIC PREGNANCY SURGERY  1975  . ORIF ANKLE FRACTURE Right 10/01/2019   Procedure: OPEN REDUCTION INTERNAL FIXATION (ORIF) ANKLE FRACTURE;  Surgeon: Christena Flake, MD;  Location: [facility] ORS;  Service: Orthopedics;  Laterality: Right;  . TONSILLECTOMY       Current Outpatient  Medications  Medication Sig Dispense Refill  . amiodarone (PACERONE) 200 MG tablet Take 0.5 tablets (100 mg total) by mouth daily. 180 tablet 3  . apixaban (ELIQUIS) 5 MG TABS tablet Take 1 tablet (5 mg total) by mouth 2 (two) times daily. 60 tablet 6  . carvedilol (COREG) 3.125 MG tablet Take 1 tablet (3.125 mg total) by mouth 2 (two) times daily. 180 tablet 3  . diclofenac sodium (VOLTAREN) 1 % GEL Apply 1 application topically 3 (three) times daily as needed (knee pain).    Marland Kitchen esomeprazole (NEXIUM) 40 MG capsule TAKE 1 CAPSULE BY MOUTH EVERY MORNING 30 capsule 3  . furosemide (LASIX) 40 MG tablet Hold until PCP followup due to AKI. 30 tablet 5  . HYDROcodone-acetaminophen (NORCO) 10-325 MG tablet Take 1 tablet by mouth every 6 (six) hours as needed.    Marland Kitchen levothyroxine (SYNTHROID) 100 MCG tablet Take 100 mcg by mouth daily.     . rosuvastatin (CRESTOR) 10 MG tablet Take 10 mg by mouth at bedtime.    . sacubitril-valsartan (ENTRESTO) 49-51 MG Take 1 tablet by mouth 2 (two) times daily.    Marland Kitchen zolpidem (AMBIEN) 10 MG tablet Take 10 mg by mouth at bedtime.     No current facility-administered medications for this visit.    Allergies:   Levaquin [levofloxacin in d5w], Metoprolol, Tikosyn [dofetilide], Ace inhibitors, and Adhesive [tape]    Social History:  The patient  reports that she has never smoked. She has never used smokeless tobacco. She reports current alcohol use of about 7.0 standard drinks of alcohol per week. She reports that she does not use drugs.   Family History:  The patient's family history includes Breast cancer (age of onset: 38) in her mother; CAD in her father; Heart attack in her father; Hypertension in her mother.    ROS:  Please see the history of present illness.   Otherwise, review of systems are positive for none.   All other systems are reviewed and negative.    PHYSICAL EXAM: VS:  BP 133/86 (BP Location: Left Arm, Patient Position: Sitting, Cuff Size: Large)    Pulse (!) 118   Ht 5\' 3"  (1.6 m)   Wt 249 lb (112.9 kg)   SpO2 95%   BMI 44.11 kg/m  , BMI Body mass index is 44.11 kg/m. GEN: Well nourished, well developed, in no acute distress  HEENT: normal  Neck: no JVD, carotid bruits, or masses Cardiac: Irregularly irregular and tachycardic.; no  rubs, or gallops,no edema . 1/6 systolic ejection murmur in the aortic area Respiratory:  clear to auscultation bilaterally, normal work of breathing GI: soft, nontender, nondistended, + BS MS: no deformity or atrophy  Skin: warm and dry, no rash Neuro:  Strength and sensation are intact Psych: euthymic mood, full affect   EKG:  EKG is ordered today. The ekg  ordered today demonstrates atrial fibrillation with ventricular rate of 118 bpm.  Poor R wave progression in the anterior leads.  Recent Labs: 10/11/2019: B Natriuretic Peptide 1,800.8; TSH 0.530 10/13/2019: Hemoglobin 9.1; Platelets 228 10/14/2019: ALT 84; Magnesium 1.6 10/15/2019: BUN 29; Creatinine, Ser 1.18; Potassium 3.2; Sodium 138    Lipid Panel    Component Value Date/Time   CHOL 194 08/28/2013 0352   TRIG 122 08/28/2013 0352   HDL 56 08/28/2013 0352   VLDL 24 08/28/2013 0352   LDLCALC 114 (H) 08/28/2013 0352      Wt Readings from Last 3 Encounters:  02/08/20 249 lb (112.9 kg)  12/13/19 243 lb (110.2 kg)  10/25/19 236 lb (107 kg)         ASSESSMENT AND PLAN:  1.  Persistent atrial fibrillation: Unfortunately, the patient went back into A. fib with RVR likely due to interruption of her amiodarone.  She has been on 100 mg once daily since August.  I am going to increase the dose to 200 mg twice daily until we proceed with cardioversion.  She is very symptomatic during atrial fibrillation and due to that, I recommend proceeding with cardioversion.  She has not missed any dose of Eliquis.  I discussed the procedure in details as well as risks and benefits.  2. Chronic systolic/diastolic heart failure: Low EF in the past in  the setting of atrial fibrillation improved to low normal at 50 to 55%.  Continue treatment with carvedilol and Entresto.  I am worried that her ejection fraction might decrease again if she remains in atrial fibrillation.  3. Obstructive sleep apnea: Doing well with CPAP.  4.  Essential hypertension: Blood pressure is controlled.  5.  Monitoring of high risk medication: She is on amiodarone.  She had labs with her primary care physician which showed normalization of LFTs and creatinine of 1.4.  She does have anemia which has been stable with hemoglobin of 9.4.   Disposition:   FU with me in 1 month.  Signed,  [name], MD  02/08/2020 10:00 AM    [facility]

## 2020-02-08 NOTE — Patient Instructions (Addendum)
Medication Instructions: Your physician has recommended you make the following change in your medication: Increase Amiodarone to 200 mg take one tablet by mouth twice daily.  *If you need a refill on your cardiac medications before your next appointment, please call your pharmacy*   Lab Work: BMET & CBC Today.   Covid Testing at the [facility] on Thursday, February 14, 2020 between 8 am and 1 pm.   If you have labs (blood work) drawn today and your tests are completely normal, you will receive your results only by: Marland Kitchen MyChart Message (if you have MyChart) OR . A paper copy in the mail If you have any lab test that is abnormal or we need to change your treatment, we will call you to review the results.   Testing: You are scheduled for a Cardioversion on  February 18, 2020 with Dr. Kirke Corin  [facility] at 6:30 a.m. on the day of your procedure.  DIET INSTRUCTIONS:  Nothing to eat or drink after midnight except your medications with a              sip of water.         1) Labs:  BMET & CBC today   2) Medications:  YOU MAY TAKE ALL of your remaining medications with a small amount of water.  3) Must have a responsible person to drive you home.  4) Bring a current list of your medications and current insurance cards.    If you have any questions after you get home, please call the office at [phone] Follow-Up: At [facility], you and your health needs are our priority.  As part of our continuing mission to provide you with exceptional heart care, we have created designated Provider Care Teams.  These Care Teams include your primary Cardiologist (physician) and Advanced Practice Providers (APPs -  Physician Assistants and Nurse Practitioners) who all work together to provide you with the care you need, when you need it.  We recommend signing up for the patient portal called "MyChart".  Sign up information is provided on this After Visit Summary.   MyChart is used to connect with patients for Virtual Visits (Telemedicine).  Patients are able to view lab/test results, encounter notes, upcoming appointments, etc.  Non-urgent messages can be sent to your provider as well.   To learn more about what you can do with MyChart, go to [email or web address].    Your next appointment:   1 month(s)  The format for your next appointment:   In Person  Provider:   You may see [name], MD or one of the following Advanced Practice Providers on your designated Care Team:    Nicolasa Ducking, NP  Eula Listen, PA-C  Marisue Ivan, PA-C  Cadence Aquilla, [place]

## 2020-02-09 LAB — CBC WITH DIFFERENTIAL/PLATELET
Basophils Absolute: 0.1 10*3/uL (ref 0.0–0.2)
Basos: 1 %
EOS (ABSOLUTE): 0.3 10*3/uL (ref 0.0–0.4)
Eos: 6 %
Hematocrit: 29.8 % — ABNORMAL LOW (ref 34.0–46.6)
Hemoglobin: 9.4 g/dL — ABNORMAL LOW (ref 11.1–15.9)
Immature Grans (Abs): 0 10*3/uL (ref 0.0–0.1)
Immature Granulocytes: 0 %
Lymphocytes Absolute: 1 10*3/uL (ref 0.7–3.1)
Lymphs: 20 %
MCH: 29.5 pg (ref 26.6–33.0)
MCHC: 31.5 g/dL (ref 31.5–35.7)
MCV: 93 fL (ref 79–97)
Monocytes Absolute: 0.6 10*3/uL (ref 0.1–0.9)
Monocytes: 11 %
Neutrophils Absolute: 3.2 10*3/uL (ref 1.4–7.0)
Neutrophils: 62 %
Platelets: 232 10*3/uL (ref 150–450)
RBC: 3.19 x10E6/uL — ABNORMAL LOW (ref 3.77–5.28)
RDW: 14.6 % (ref 11.7–15.4)
WBC: 5.1 10*3/uL (ref 3.4–10.8)

## 2020-02-09 LAB — BASIC METABOLIC PANEL
BUN/Creatinine Ratio: 20 (ref 12–28)
BUN: 23 mg/dL (ref 8–27)
CO2: 24 mmol/L (ref 20–29)
Calcium: 9 mg/dL (ref 8.7–10.3)
Chloride: 105 mmol/L (ref 96–106)
Creatinine, Ser: 1.13 mg/dL — ABNORMAL HIGH (ref 0.57–1.00)
GFR calc Af Amer: 58 mL/min/{1.73_m2} — ABNORMAL LOW (ref >59)
GFR calc non Af Amer: 50 mL/min/{1.73_m2} — ABNORMAL LOW (ref >59)
Glucose: 101 mg/dL — ABNORMAL HIGH (ref 65–99)
Potassium: 4.6 mmol/L (ref 3.5–5.2)
Sodium: 146 mmol/L — ABNORMAL HIGH (ref 134–144)

## 2020-02-12 ENCOUNTER — Telehealth: Payer: Self-pay | Admitting: Cardiovascular Disease

## 2020-02-12 NOTE — Telephone Encounter (Signed)
Spoke to preadmit testing and you do not have to have a specific time.  Called patient and she is aware and will plan to go between 8 am and 1pm on 02/15/20.

## 2020-02-12 NOTE — Telephone Encounter (Signed)
Patient calling  States that she will be needing a COVID test before her procedure  She was told she they are scheduling specific times now and has not heard when appt will be  Please call to discuss

## 2020-02-13 ENCOUNTER — Telehealth: Payer: Self-pay | Admitting: Cardiovascular Disease

## 2020-02-13 NOTE — Telephone Encounter (Signed)
Spoke with the patient. Reviewed medication instructions for her upcoming dccv. Adv the patient that she should not miss any doses of her Eliquis. She can take all of her morning medications except Lasix the morning of the procedure with a sip of water. Adv the pt that she can take her Lasix later in the day after the procedure.   Patient verbalized understanding and voiced appreciation for the call back.

## 2020-02-13 NOTE — Telephone Encounter (Signed)
Patient wants to discuss meds and pre procedure instructions

## 2020-02-14 ENCOUNTER — Other Ambulatory Visit
Admission: RE | Admit: 2020-02-14 | Discharge: 2020-02-14 | Disposition: A | Discharge status: Home / Self Care | Payer: Medicare Other | Source: Ambulatory Visit | Attending: Cardiovascular Disease | Admitting: Cardiovascular Disease

## 2020-02-14 ENCOUNTER — Other Ambulatory Visit: Payer: Self-pay

## 2020-02-14 DIAGNOSIS — Z20822 Contact with and (suspected) exposure to covid-19: Secondary | ICD-10-CM | POA: Diagnosis not present

## 2020-02-14 DIAGNOSIS — Z01812 Encounter for preprocedural laboratory examination: Secondary | ICD-10-CM | POA: Insufficient documentation

## 2020-02-15 ENCOUNTER — Other Ambulatory Visit: Payer: No Typology Code available for payment source

## 2020-02-15 LAB — SARS CORONAVIRUS 2 (TAT 6-24 HRS): SARS Coronavirus 2: NEGATIVE

## 2020-02-18 ENCOUNTER — Telehealth: Payer: Self-pay

## 2020-02-18 ENCOUNTER — Ambulatory Visit: Payer: Medicare Other | Admitting: Registered Nurse

## 2020-02-18 ENCOUNTER — Encounter: Payer: Self-pay | Admitting: Cardiovascular Disease

## 2020-02-18 ENCOUNTER — Ambulatory Visit
Admission: RE | Admit: 2020-02-18 | Discharge: 2020-02-18 | Disposition: A | Discharge status: Home / Self Care | Payer: Medicare Other | Attending: Cardiovascular Disease | Admitting: Cardiovascular Disease

## 2020-02-18 ENCOUNTER — Encounter
Admission: RE | Disposition: A | Discharge status: Home / Self Care | Payer: Self-pay | Source: Home / Self Care | Attending: Cardiovascular Disease

## 2020-02-18 DIAGNOSIS — K219 Gastro-esophageal reflux disease without esophagitis: Secondary | ICD-10-CM | POA: Insufficient documentation

## 2020-02-18 DIAGNOSIS — G4733 Obstructive sleep apnea (adult) (pediatric): Secondary | ICD-10-CM | POA: Diagnosis not present

## 2020-02-18 DIAGNOSIS — I509 Heart failure, unspecified: Secondary | ICD-10-CM | POA: Diagnosis not present

## 2020-02-18 DIAGNOSIS — I2510 Atherosclerotic heart disease of native coronary artery without angina pectoris: Secondary | ICD-10-CM | POA: Insufficient documentation

## 2020-02-18 DIAGNOSIS — E039 Hypothyroidism, unspecified: Secondary | ICD-10-CM | POA: Insufficient documentation

## 2020-02-18 DIAGNOSIS — E669 Obesity, unspecified: Secondary | ICD-10-CM | POA: Insufficient documentation

## 2020-02-18 DIAGNOSIS — Z8249 Family history of ischemic heart disease and other diseases of the circulatory system: Secondary | ICD-10-CM | POA: Diagnosis not present

## 2020-02-18 DIAGNOSIS — Z6841 Body Mass Index (BMI) 40.0 and over, adult: Secondary | ICD-10-CM | POA: Diagnosis not present

## 2020-02-18 DIAGNOSIS — E785 Hyperlipidemia, unspecified: Secondary | ICD-10-CM | POA: Diagnosis not present

## 2020-02-18 DIAGNOSIS — I110 Hypertensive heart disease with heart failure: Secondary | ICD-10-CM | POA: Diagnosis not present

## 2020-02-18 DIAGNOSIS — I4819 Other persistent atrial fibrillation: Secondary | ICD-10-CM

## 2020-02-18 HISTORY — PX: CARDIOVERSION: SHX1299

## 2020-02-18 SURGERY — CARDIOVERSION
Anesthesia: General

## 2020-02-18 MED ORDER — EPHEDRINE SULFATE 50 MG/ML IJ SOLN
INTRAMUSCULAR | Status: DC | PRN
  Administered 2020-02-18 (×2): 5 mg via INTRAVENOUS

## 2020-02-18 MED ORDER — SODIUM CHLORIDE 0.9 % IV SOLN: INTRAVENOUS | Status: DC | PRN

## 2020-02-18 MED ORDER — PROPOFOL 10 MG/ML IV BOLUS
INTRAVENOUS | Status: AC
  Filled 2020-02-18: qty 20

## 2020-02-18 MED ORDER — AMIODARONE HCL 200 MG PO TABS: 200.0000 mg | ORAL_TABLET | Freq: Every day | ORAL | Status: DC

## 2020-02-18 MED ORDER — SODIUM CHLORIDE 0.9 % IV SOLN
Freq: Once | INTRAVENOUS | Status: AC
  Administered 2020-02-18: 20 mL via INTRAVENOUS

## 2020-02-18 MED ORDER — PROPOFOL 10 MG/ML IV BOLUS
INTRAVENOUS | Status: DC | PRN
  Administered 2020-02-18: 70 mg via INTRAVENOUS
  Administered 2020-02-18: 30 mg via INTRAVENOUS
  Administered 2020-02-18: 20 mg via INTRAVENOUS
  Administered 2020-02-18: 10 mg via INTRAVENOUS
  Administered 2020-02-18: 30 mg via INTRAVENOUS
  Administered 2020-02-18 (×2): 20 mg via INTRAVENOUS
  Administered 2020-02-18: 10 mg via INTRAVENOUS
  Administered 2020-02-18 (×2): 20 mg via INTRAVENOUS
  Administered 2020-02-18: 30 mg via INTRAVENOUS
  Administered 2020-02-18: 20 mg via INTRAVENOUS
  Administered 2020-02-18: 10 mg via INTRAVENOUS

## 2020-02-18 NOTE — Anesthesia Preprocedure Evaluation (Signed)
Anesthesia Evaluation  Patient identified by MRN, date of birth, ID band Patient awake    Reviewed: Allergy & Precautions, NPO status , Patient's Chart, lab work & pertinent test results  History of Anesthesia Complications Negative for: history of anesthetic complications  Airway Mallampati: III  TM Distance: >3 FB Neck ROM: Full    Dental  (+) Dental Advidsory Given   Pulmonary sleep apnea and Continuous Positive Airway Pressure Ventilation , neg COPD,    breath sounds clear to auscultation- rhonchi (-) wheezing      Cardiovascular hypertension, Pt. on medications (-) angina+ CAD and +CHF  (-) Past MI, (-) Cardiac Stents and (-) CABG + dysrhythmias Atrial Fibrillation (-) Valvular Problems/Murmurs Rhythm:irregular Rate:Abnormal - Systolic murmurs and - Diastolic murmurs Echo 12/06/17: - Left ventricle: Systolic function was normal. The estimated  ejection fraction was in the range of 50% to 55%. Doppler  parameters are consistent with abnormal left ventricular  relaxation (grade 1 diastolic dysfunction).  - Left atrium: The atrium was mildly dilated.  - Right ventricle: The cavity size was normal. Systolic function  was normal.  - Right atrium: The atrium was mildly dilated.    Neuro/Psych neg Seizures PSYCHIATRIC DISORDERS Anxiety Depression negative neurological ROS     GI/Hepatic Neg liver ROS, GERD  ,  Endo/Other  Hypothyroidism   Renal/GU CRFRenal disease     Musculoskeletal   Abdominal (+) + obese,   Peds  Hematology   Anesthesia Other Findings Past Medical History: No date: Anxiety No date: Arthritis     Comment:  "knees" (09/26/2017) No date: Chronic diastolic CHF (congestive heart failure) (HCC)     Comment:  a. Dx 08/2013 in setting of rapid afib;  b. 08/2013 Echo:               EF 55-60%, mildly dil LA, nl RV. 10/06/2017: Drug-induced torsades de pointes No date: GERD (gastroesophageal reflux  disease) No date: High cholesterol 1975: History of blood transfusion     Comment:  "related to ectopic pregnancy No date: Hypertension No date: Non-obstructive CAD     Comment:  a. 10/2013 Myoview: anteroseptal, apical, septal mild               ischemia, nl LV fxn;  b. Cath: LAD 55m LCX 20ost, RCA               17m. No date: Obesity No date: OSA on CPAP No date: Paroxysmal atrial fibrillation (HCC)     Comment:  a. Dx 08/2013. CHA2DS2VASc = 3 (diast chf, htn, female)               -->Xarelto initiated; b. 09/2013 s/p DCCV;  c. 10/2013               recurrent afib->Flecainide added->S/P DCCV;  d. 11/2013               Recurrent afib while off of flecainide (2/2 abnl nuc               study)-->Flecainide resumed after nl cath-->DCCV;  e.               Recurrent Afib 01/2014-->Flecainide increased to 150mg                BID. No date: Pneumonia     Comment:  "1-2 times" (09/26/2017)   Reproductive/Obstetrics  Anesthesia Physical  Anesthesia Plan  ASA: III  Anesthesia Plan: General   Post-op Pain Management:    Induction: Intravenous  PONV Risk Score and Plan: 2 and Propofol infusion and TIVA  Airway Management Planned: Natural Airway and Nasal Cannula  Additional Equipment:   Intra-op Plan:   Post-operative Plan: Extubation in OR  Informed Consent: I have reviewed the patients History and Physical, chart, labs and discussed the procedure including the risks, benefits and alternatives for the proposed anesthesia with the patient or authorized representative who has indicated his/her understanding and acceptance.     Dental advisory given  Plan Discussed with: CRNA and Anesthesiologist  Anesthesia Plan Comments: (Evaluated by cardiology and cleared to proceed without any further cardiac testing)        Anesthesia Quick Evaluation

## 2020-02-18 NOTE — Transfer of Care (Signed)
Immediate Anesthesia Transfer of Care Note  Patient: Stacey [name]  Procedure(s) Performed: Procedure(s): CARDIOVERSION (N/A)  Patient Location: PACU and Short Stay  Anesthesia Type:General  Level of Consciousness: awake, alert  and oriented  Airway & Oxygen Therapy: Patient Spontanous Breathing and Patient connected to nasal cannula oxygen  Post-op Assessment: Report given to RN and Post -op Vital signs reviewed and stable  Post vital signs: Reviewed and stable  Last Vitals:  Vitals:   02/18/20 0803 02/18/20 0807  BP: (!) 85/40 (!) 97/48  Pulse:  (!) 59  Resp: 12   Temp:    SpO2: 97%     Complications: No apparent anesthesia complications

## 2020-02-18 NOTE — Telephone Encounter (Signed)
Called to give the patient lab results and Dr. Jari Sportsman recommendation. Patients home phone has no voicemail. [email or web address] on the patients cell.

## 2020-02-18 NOTE — CV Procedure (Signed)
Cardioversion note: A standard informed consent was obtained. Timeout was performed. The pads were placed in the anterior posterior fashion. The patient was given propofol by the anesthesia team.  The patient required a large dose of propofol 320 mg. The patient was shocked twice with 200 J but did not convert to sinus rhythm.  Thus, the anterior pad was repositioned to midline above the atrium.  A third shock of 200 J was successful in converting sinus rhythm. Pre-and post EKGs were reviewed. The patient tolerated the procedure with no immediate complications.  Recommendations: Decrease amiodarone to 200 mg once daily.  Continue other medications.

## 2020-02-18 NOTE — Anesthesia Procedure Notes (Signed)
Date/Time: 02/18/2020 7:41 AM Performed by: Stormy Fabian, CRNA Pre-anesthesia Checklist: Patient identified, Emergency Drugs available, Suction available and Patient being monitored Patient Re-evaluated:Patient Re-evaluated prior to induction Oxygen Delivery Method: Supernova nasal CPAP Induction Type: IV induction Dental Injury: Teeth and Oropharynx as per pre-operative assessment  Comments: Nasal cannula with etCO2 monitoring

## 2020-02-18 NOTE — Anesthesia Postprocedure Evaluation (Signed)
Anesthesia Post Note  Patient: Stacey [name]  Procedure(s) Performed: CARDIOVERSION (N/A )  Patient location during evaluation: Specials Recovery Anesthesia Type: General Level of consciousness: awake and alert Pain management: pain level controlled Vital Signs Assessment: post-procedure vital signs reviewed and stable Respiratory status: spontaneous breathing, nonlabored ventilation, respiratory function stable and patient connected to nasal cannula oxygen Cardiovascular status: blood pressure returned to baseline and stable Postop Assessment: no apparent nausea or vomiting Anesthetic complications: no   No complications documented.   Last Vitals:  Vitals:   02/18/20 0820 02/18/20 0830  BP: (!) 92/51 (!) 104/52  Pulse: (!) 58 (!) 58  Resp: 16 15  Temp:    SpO2: 95% 96%    Last Pain:  Vitals:   02/18/20 0830  TempSrc:   PainSc: 0-No pain                 Lenard Simmer

## 2020-02-18 NOTE — Telephone Encounter (Signed)
-----   Message from Iran Ouch, MD sent at 02/16/2020  8:41 AM EDT ----- Inform patient that labs showed slight dehydration. Increase fluid intake. She is still anemic and should discuss with her PCP.

## 2020-02-18 NOTE — Interval H&P Note (Signed)
History and Physical Interval Note:  02/18/2020 7:36 AM  Stacey [name]  has presented today for surgery, with the diagnosis of Cardioversion   Afib.  The various methods of treatment have been discussed with the patient and family. After consideration of risks, benefits and other options for treatment, the patient has consented to  Procedure(s): CARDIOVERSION (N/A) as a surgical intervention.  The patient's history has been reviewed, patient examined, no change in status, stable for surgery.  I have reviewed the patient's chart and labs.  Questions were answered to the patient's satisfaction.     [name]

## 2020-02-19 ENCOUNTER — Encounter: Payer: Self-pay | Admitting: Cardiovascular Disease

## 2020-02-19 NOTE — Addendum Note (Signed)
Addendum  created 02/19/20 0946 by Karoline Caldwell, CRNA   Charge Capture section accepted

## 2020-02-19 NOTE — Telephone Encounter (Signed)
Patient made aware of lab results and Dr. Arida's recommendation with verbalized understanding. °

## 2020-02-23 ENCOUNTER — Other Ambulatory Visit: Payer: Self-pay | Admitting: Internal Medicine

## 2020-03-16 ENCOUNTER — Other Ambulatory Visit: Payer: Self-pay | Admitting: Internal Medicine

## 2020-03-17 NOTE — Telephone Encounter (Signed)
Pt's age 69, wt 112.9 kg, SCr 1.13, CrCl 86.1, last ov w/ MA 02/08/20.

## 2020-03-26 ENCOUNTER — Ambulatory Visit (INDEPENDENT_AMBULATORY_CARE_PROVIDER_SITE_OTHER): Payer: Medicare Other | Admitting: Nurse Practitioner

## 2020-03-26 ENCOUNTER — Encounter: Payer: Self-pay | Admitting: Nurse Practitioner

## 2020-03-26 ENCOUNTER — Other Ambulatory Visit: Payer: Self-pay

## 2020-03-26 VITALS — BP 148/82 | HR 66 | Ht 63.0 in | Wt 244.5 lb

## 2020-03-26 DIAGNOSIS — I4819 Other persistent atrial fibrillation: Secondary | ICD-10-CM

## 2020-03-26 DIAGNOSIS — I5032 Chronic diastolic (congestive) heart failure: Secondary | ICD-10-CM | POA: Diagnosis not present

## 2020-03-26 DIAGNOSIS — E782 Mixed hyperlipidemia: Secondary | ICD-10-CM

## 2020-03-26 DIAGNOSIS — R9431 Abnormal electrocardiogram [ECG] [EKG]: Secondary | ICD-10-CM | POA: Diagnosis not present

## 2020-03-26 DIAGNOSIS — I10 Essential (primary) hypertension: Secondary | ICD-10-CM | POA: Diagnosis not present

## 2020-03-26 NOTE — Patient Instructions (Signed)
Medication Instructions:  Your physician recommends that you continue on your current medications as directed. Please refer to the Current Medication list given to you today.  *If you need a refill on your cardiac medications before your next appointment, please call your pharmacy*   Lab Work:  Your physician recommends that you have lab work TODAY:  Bmet, Magnesium  If you have labs (blood work) drawn today and your tests are completely normal, you will receive your results only by:  MyChart Message (if you have MyChart) OR  A paper copy in the mail If you have any lab test that is abnormal or we need to change your treatment, we will call you to review the results.   Testing/Procedures: None ordered   Follow-Up: At [facility], you and your health needs are our priority.  As part of our continuing mission to provide you with exceptional heart care, we have created designated Provider Care Teams.  These Care Teams include your primary Cardiologist (physician) and Advanced Practice Providers (APPs -  Physician Assistants and Nurse Practitioners) who all work together to provide you with the care you need, when you need it.  We recommend signing up for the patient portal called "MyChart".  Sign up information is provided on this After Visit Summary.  MyChart is used to connect with patients for Virtual Visits (Telemedicine).  Patients are able to view lab/test results, encounter notes, upcoming appointments, etc.  Non-urgent messages can be sent to your provider as well.   To learn more about what you can do with MyChart, go to [email or web address].    Your next appointment:   1 month(s)  The format for your next appointment:   In Person  Provider:   You may see [name], MD or one of the following Advanced Practice Providers on your designated Care Team:    Nicolasa Ducking, NP

## 2020-03-26 NOTE — Progress Notes (Signed)
Office Visit    Patient Name: Stacey [name] Date of Encounter: 03/26/2020  Primary Care Provider:  Marguarite Arbour, MD Primary Cardiologist:  [name], MD  Chief Complaint    69 year old female with a history of paroxysmal atrial fibrillation on Eliquis and amiodarone, HFimpEF, hypertension, hyperlipidemia, GERD, hypothyroidism, anxiety, obstructive sleep apnea on CPAP, nonobstructive CAD, drug-induced torsades de pointes (Tikosyn June 2019), and obesity, who presents for follow-up after recent cardioversion.  Past Medical History    Past Medical History:  Diagnosis Date   Anxiety    Arthritis    "knees" (09/26/2017)   Bacteremia    a.09/2019 E. coli bacteremia - unclear source.   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 Dx 08/2013 in setting of rapid afib;  b. 08/2013 Echo: EF 55-60%, mildly dil LA, nl RV; c. 08/2017 Echo: EF 20-25% (in afib); d. 11/2017 Echo: EF 50-55%; e. 09/2019 Echo: EF 55-60%, no rwma.   Drug-induced torsades de pointes (HCC) 10/06/2017   GERD (gastroesophageal reflux disease)    High cholesterol    History of blood transfusion 1975   "related to ectopic pregnancy   Hypertension    Non-obstructive CAD    a. 10/2013 Myoview: anteroseptal, apical, septal mild ischemia, nl LV fxn;  b. Cath: LAD 64m LCX 20ost, RCA 68m.   Obesity    OSA on CPAP    Paroxysmal atrial fibrillation (HCC)    a. Dx 08/2013. CHA2DS2VASc = 4 (Eliquis); b. Prev on flecainide w/ multiple DCCVs; c. 09/2017 Tikosyn added-->TdP 2/2 prolonged QT->amio; d. 11/2018 s/p DCCV-amio cont; e. 01/2020 recurrent Afib on low-dose amio-->DCCV (200J x 3), Amio cont @ 200 Daily.   Pneumonia    "1-2 times" (09/26/2017)   Tachycardia induced cardiomyopathy (HCC)    a. 08/2017 Echo: EF 20-25%, diff HK in setting of Afib w/ RVR; b. 11/2017 Echo: EF 50-55%; c. 09/2019 Echo: EF 55-60%.   Past Surgical History:  Procedure Laterality Date   BREAST BIOPSY Right 2007    stereo-neg   CARDIAC CATHETERIZATION  10/2013   [facility]   CARDIOVERSION N/A 08/29/2017   Procedure: CARDIOVERSION;  Surgeon: Iran Ouch, MD;  Location: [facility] OR[facility];  Service: Cardiovascular;  Laterality: N/A;   CARDIOVERSION     "I've had a total of 4" (09/26/2017)   CARDIOVERSION N/A 12/08/2018   Procedure: CARDIOVERSION;  Surgeon: Antonieta Iba, MD;  Location: [facility];  Service: Cardiovascular;  Laterality: N/A;   CARDIOVERSION N/A 02/18/2020   Procedure: CARDIOVERSION;  Surgeon: Iran Ouch, MD;  Location: [facility];  Service: Cardiovascular;  Laterality: N/A;   ECTOPIC PREGNANCY SURGERY  1975   ORIF ANKLE FRACTURE Right 10/01/2019   Procedure: OPEN REDUCTION INTERNAL FIXATION (ORIF) ANKLE FRACTURE;  Surgeon: Christena Flake, MD;  Location: [facility];  Service: Orthopedics;  Laterality: Right;   TONSILLECTOMY      Allergies  Allergies  Allergen Reactions   Levaquin [Levofloxacin In D5w] Other (See Comments)    Severe dizziness    Metoprolol Itching, Nausea And Vomiting, Rash and Other (See Comments)    Dizziness   Tikosyn [Dofetilide] Other (See Comments)    Developed Torsades on Tikosyn 09/28/17   Ace Inhibitors Cough   Adhesive [Tape] Rash    History of Present Illness    69 year old female with above complex past medical history including paroxysmal atrial fibrillation, hypertension, hyperlipidemia, GERD, hypothyroidism, anxiety, obstructive sleep apnea, nonobstructive CAD, drug-induced torsades to pointes [name] 2019), obesity, and heart  failure with improved ejection fraction.  Atrial fibrillation was first noted in 2015.  She subsequently underwent diagnostic catheterization revealing nonobstructive CAD.  She required several cardioversions and had recurrent atrial fibrillation in May 2019 with echo at that time showing an EF of 20-25% with diffuse hypokinesis.  Cardiomyopathy was felt to be tachycardia mediated.  In June 2019, she was admitted for Tikosyn  loading with reversion to sinus rhythm but subsequent development of prolonged QT and torsades.  Tikosyn was discontinued and she was subsequently placed on amiodarone therapy.  EF normalized with restoration of sinus rhythm.  She required repeat cardioversion August 2020 secondary to recurrent atrial fibrillation.  In June 2021, she was admitted with right ankle fracture following mechanical fall and subsequently required readmission for respiratory failure, pulmonary edema, and E. coli bacteremia.  CT of the abdomen showed moderate cholelithiasis but no acute findings.  Was felt that infectious source could be secondary to passed gallstone.  During hospitalization, she had elevated LFTs and acute kidney injury with a peak creatinine of 1.91.  In the setting of LFT elevation, hypotension, bradycardia, and QT prolongation, amiodarone was held during hospitalization and subsequent rehab stay.  When seen in follow-up in August, she was bradycardic but out of concern for potential of recurrent A. fib, she was placed back on amiodarone 100 mg daily.  Unfortunately, at follow-up October 15, she reported a 1 to 2-week history of palpitations and dyspnea and she was found to be in A. fib with RVR.  Amiodarone dose was increased to 200 mg twice daily and she subsequently underwent successful cardioversion on October 25 (200 J x 3).  Amiodarone dose was reduced to 200 mg daily at that time.  Since cardioversion, she has felt well.  She has had resolution of fatigue and dyspnea.  She is not aware of any recurrent palpitations or A. fib.  She denies chest pain, PND, orthopnea, dizziness, syncope, or early satiety.  She sometimes notes mild right greater than left lower extremity swelling if she is sitting for too long of time.  Home Medications    Prior to Admission medications   Medication Sig Start Date End Date Taking? Authorizing Provider  amiodarone (PACERONE) 200 MG tablet Take 1 tablet (200 mg total) by mouth  daily. 02/18/20   Iran Ouch, MD  carvedilol (COREG) 3.125 MG tablet Take 1 tablet (3.125 mg total) by mouth 2 (two) times daily. 12/13/19 03/12/20  Creig Hines, NP  diclofenac sodium (VOLTAREN) 1 % GEL Apply 1 application topically 3 (three) times daily as needed (knee pain).    [provider]  ELIQUIS 5 MG TABS tablet TAKE 1 TABLET(5 MG) BY MOUTH TWICE DAILY 03/17/20   Iran Ouch, MD  ENTRESTO 49-51 MG TAKE 1 TABLET BY MOUTH TWICE DAILY 02/25/20   Iran Ouch, MD  esomeprazole (NEXIUM) 40 MG capsule TAKE 1 CAPSULE BY MOUTH EVERY MORNING Patient taking differently: Take 40 mg by mouth daily.  12/15/17   Duke Salvia, MD  furosemide (LASIX) 40 MG tablet Hold until PCP followup due to AKI. Patient taking differently: Take 40 mg by mouth daily.  10/04/19   Darlin Priestly, MD  HYDROcodone-acetaminophen (NORCO) 10-325 MG tablet Take 1 tablet by mouth every 6 (six) hours as needed for moderate pain or severe pain.     [provider]  levothyroxine (SYNTHROID) 100 MCG tablet Take 100 mcg by mouth daily.  10/02/18   [provider]  mupirocin ointment (BACTROBAN) 2 %  Apply 1 application topically 2 (two) times daily.  12/24/19   [provider]  ondansetron (ZOFRAN) 4 MG tablet Take 4 mg by mouth every 8 (eight) hours as needed for nausea or vomiting.    [provider]  rosuvastatin (CRESTOR) 10 MG tablet Take 10 mg by mouth at bedtime. 09/29/19   [provider]  venlafaxine (EFFEXOR) 37.5 MG tablet Take 37.5 mg by mouth 2 (two) times daily. 01/19/20   [provider]  zolpidem (AMBIEN) 10 MG tablet Take 10 mg by mouth at bedtime.    [provider]    Review of Systems    Feeling better since cardioversion.  She denies chest pain, dyspnea, palpitations, PND, orthopnea, dizziness, syncope, or early satiety.  She occasionally notes mild right greater than left lower extremity swelling if she sits for too  long a period of time.  All other systems reviewed and are otherwise negative except as noted above.  Physical Exam    VS:  BP (!) 148/82    Pulse 66    Ht 5\' 3"  (1.6 m)    Wt 244 lb 8 oz (110.9 kg)    SpO2 96%    BMI 43.31 kg/m  , BMI Body mass index is 43.31 kg/m. GEN: obese, in no acute distress. HEENT: normal. Neck: Supple, no JVD, carotid bruits, or masses. Cardiac: RRR, 1/6 SEM @ upper sternal borders, no rubs, or gallops. No clubbing, cyanosis, trace right ankle edema.  Radials/PT 2+ and equal bilaterally.  Respiratory:  Respirations regular and unlabored, clear to auscultation bilaterally. GI: Soft, nontender, nondistended, BS + x 4. MS: no deformity or atrophy. Skin: warm and dry, no rash. Neuro:  Strength and sensation are intact. Psych: Normal affect.  Accessory Clinical Findings    ECG personally reviewed by me today -regular sinus rhythm, 63, QT 516, QTc 528- no acute changes.  Lab Results  Component Value Date   WBC 5.1 02/08/2020   HGB 9.4 (L) 02/08/2020   HCT 29.8 (L) 02/08/2020   MCV 93 02/08/2020   PLT 232 02/08/2020   Lab Results  Component Value Date   CREATININE 1.13 (H) 02/08/2020   BUN 23 02/08/2020   NA 146 (H) 02/08/2020   K 4.6 02/08/2020   CL 105 02/08/2020   CO2 24 02/08/2020   Lab Results  Component Value Date   ALT 84 (H) 10/14/2019   AST 64 (H) 10/14/2019   ALKPHOS 368 (H) 10/14/2019   BILITOT 1.6 (H) 10/14/2019   Lab Results  Component Value Date   CHOL 194 08/28/2013   HDL 56 08/28/2013   LDLCALC 114 (H) 08/28/2013   TRIG 122 08/28/2013     Assessment & Plan    1.  Persistent atrial fibrillation: Patient status post multiple cardioversions and was taken off of amiodarone in the setting of acute illness and prolonged QT over the summer.  Amiodarone 100 mg daily was resumed in August but she had recurrent atrial fibrillation in October prompting cardioversion October 25.  Following cardioversion, amiodarone dose was reduced to 200  mg daily.  She remains in sinus rhythm today and is feeling much better without fatigue and dyspnea that typically goes with A. fib.  Her QTC is 528 today.  I reviewed older ECGs when she was on 200 mg of amiodarone daily and typically, her QTC would run in the 480-490 range at that time (August 2020 and February 2021).  I will follow-up a basic metabolic panel and magnesium today.  I will discuss with Dr. Kirke Corin however, if potassium and magnesium are normal, we are likely looking at reducing amiodarone back to 100 mg daily and potentially referring to EP for consideration of alternate therapies.  She remains anticoagulated on Eliquis.  2.  Chronic systolic/diastolic congestive heart failure: Low EF in the past in the setting of atrial fibrillation which improved to 55-66% by echo in June 2021.  She remains on carvedilol and Entresto.  We discussed potentially titrating Entresto further however, her pressure typically runs in the 120s at home and she does sometimes experience orthostasis.  3.  Obstructive sleep apnea: Tolerating CPAP.  4.  Essential hypertension: Blood pressures typically in the 120s at home.  She is higher today though she notes this is unusual.  5.  Prolonged QT/QTc: See #1.  Checking potassium and magnesium today.  6.  Hyperlipidemia: Continue statin therapy.  7.  Disposition: Follow-up basic metabolic panel and magnesium today.  Further recommendations pending labs as she will likely require a follow-up ECG in the next week.  I will tentatively plan to have her follow-up with Dr. Kirke Corin in 1 month.   Nicolasa Ducking, NP 03/26/2020, 12:11 PM

## 2020-03-27 LAB — BASIC METABOLIC PANEL
BUN/Creatinine Ratio: 16 (ref 12–28)
BUN: 17 mg/dL (ref 8–27)
CO2: 25 mmol/L (ref 20–29)
Calcium: 8.9 mg/dL (ref 8.7–10.3)
Chloride: 102 mmol/L (ref 96–106)
Creatinine, Ser: 1.09 mg/dL — ABNORMAL HIGH (ref 0.57–1.00)
GFR calc Af Amer: 61 mL/min/{1.73_m2} (ref >59)
GFR calc non Af Amer: 53 mL/min/{1.73_m2} — ABNORMAL LOW (ref >59)
Glucose: 94 mg/dL (ref 65–99)
Potassium: 4.2 mmol/L (ref 3.5–5.2)
Sodium: 142 mmol/L (ref 134–144)

## 2020-03-27 LAB — MAGNESIUM: Magnesium: 1.8 mg/dL (ref 1.6–2.3)

## 2020-03-28 ENCOUNTER — Telehealth: Payer: Self-pay | Admitting: *Deleted

## 2020-03-28 DIAGNOSIS — Z79899 Other long term (current) drug therapy: Secondary | ICD-10-CM

## 2020-03-28 DIAGNOSIS — I4819 Other persistent atrial fibrillation: Secondary | ICD-10-CM

## 2020-03-28 DIAGNOSIS — R9431 Abnormal electrocardiogram [ECG] [EKG]: Secondary | ICD-10-CM

## 2020-03-28 MED ORDER — MAGNESIUM OXIDE 400 (241.3 MG) MG PO TABS: 400.0000 mg | ORAL_TABLET | Freq: Every day | ORAL | 6 refills | Status: DC

## 2020-03-28 NOTE — Telephone Encounter (Signed)
Attempted to call pt to notify her arrival time for EKG @ [facility] 12/10 is 9:00 AM. No answer. Unable to leave voicemail.

## 2020-03-28 NOTE — Telephone Encounter (Signed)
Spoke to pt, notified of lab results and recc below. Pt verbalized understanding that she will start MagOx 400mg  daily. Rx sent to pharmacy. Pt will arrive at [facility] @ [facility] 04/04/20 @ 9:00 AM for EKG and will have Magnesium drawn that morning. Orders placed. Referral in place for Dr. 14/10/21 and message sent to scheduling to call pt. I have not called pt with her arrival time for EKG for 9 AM. Pt asked that I call her this afternoon after we spoke this AM as she will be available then. Pt states may leave a voicemail with time if no answer.

## 2020-03-28 NOTE — Telephone Encounter (Signed)
-----   Message from Creig Hines, NP sent at 03/27/2020  5:04 PM EST ----- Renal fxn stable.  K is nl.  Mg is on low end of nl.  I discussed with Dr. Kirke Corin.  With wide QTc, pls add MagOx 400mg  daily w/ plan for f/u ECG and Mg next week.  Pls also refer to Dr. for further eval of afib in the setting of prolonged QT on amio w/ limited options for mgmt.

## 2020-03-31 NOTE — Telephone Encounter (Signed)
Spoke to pt. Notified of arrival time. Pt asks that this be rescheduled to a later time ~1:00 PM.  Message left for [facility] scheduling to call back. Will call pt back w/ r/s time.

## 2020-03-31 NOTE — Telephone Encounter (Signed)
Spoke to scheduling, appt rescheduled from 9AM to 1PM. Notified pt to arrive at the medical mall at 12:45 PM for 1PM appt this Friday 12/10 for EKG.  Pt verbalized understanding and she will have lab work either before or after EKG.

## 2020-04-04 ENCOUNTER — Other Ambulatory Visit: Payer: Self-pay

## 2020-04-04 ENCOUNTER — Ambulatory Visit: Payer: No Typology Code available for payment source

## 2020-04-04 ENCOUNTER — Ambulatory Visit
Admission: RE | Admit: 2020-04-04 | Discharge: 2020-04-04 | Disposition: A | Discharge status: Home / Self Care | Payer: Medicare Other | Source: Ambulatory Visit | Attending: Internal Medicine | Admitting: Internal Medicine

## 2020-04-04 ENCOUNTER — Other Ambulatory Visit
Admission: RE | Admit: 2020-04-04 | Discharge: 2020-04-04 | Disposition: A | Discharge status: Home / Self Care | Payer: Medicare Other | Source: Home / Self Care | Attending: Nurse Practitioner | Admitting: Nurse Practitioner

## 2020-04-04 ENCOUNTER — Telehealth: Payer: Self-pay | Admitting: *Deleted

## 2020-04-04 DIAGNOSIS — I4819 Other persistent atrial fibrillation: Secondary | ICD-10-CM

## 2020-04-04 DIAGNOSIS — Z79899 Other long term (current) drug therapy: Secondary | ICD-10-CM

## 2020-04-04 DIAGNOSIS — R9431 Abnormal electrocardiogram [ECG] [EKG]: Secondary | ICD-10-CM

## 2020-04-04 LAB — MAGNESIUM: Magnesium: 1.9 mg/dL (ref 1.7–2.4)

## 2020-04-04 MED ORDER — MAGNESIUM OXIDE 400 (241.3 MG) MG PO TABS
400.0000 mg | ORAL_TABLET | Freq: Two times a day (BID) | ORAL | 6 refills | Status: DC
Start: 2020-04-04 — End: 2020-07-29

## 2020-04-04 NOTE — Telephone Encounter (Signed)
Spoke to pt. Notified of results and provider's recc below. Pt verbalized understanding: she will incr Mag-Ox to 400mg  BID. Med list updated. Pt will follow up as scheduled w/ Dr. 05/02/20 and Dr. 06/30/20 05/15/20. Pt has no questions at this time.

## 2020-04-04 NOTE — Telephone Encounter (Signed)
Pt had EKG and Mg level completed today at [facility] for review.

## 2020-04-04 NOTE — Telephone Encounter (Signed)
-----   Message from Creig Hines, NP sent at 04/04/2020  4:04 PM EST ----- Mg is slightly higher at 1.9 since starting Mag-Ox.  I eval ECGs from today.  QTc is stable.  To ensure stability, I think we should try and get Mg to 2.0, so would rec increasing Mag-ox to 400 bid.  F/u as planned.

## 2020-04-14 ENCOUNTER — Telehealth: Payer: Self-pay | Admitting: Cardiovascular Disease

## 2020-04-14 ENCOUNTER — Other Ambulatory Visit: Payer: Self-pay

## 2020-04-14 MED ORDER — AMIODARONE HCL 200 MG PO TABS
200.0000 mg | ORAL_TABLET | Freq: Every day | ORAL | 0 refills | Status: DC
Start: 2020-04-14 — End: 2020-07-14

## 2020-04-14 NOTE — Telephone Encounter (Signed)
*  STAT* If patient is at the pharmacy, call can be transferred to refill team.   1. Which medications need to be refilled? (please list name of each medication and dose if known) amiodarone 200 mg  2. Which pharmacy/location (including street and city if local pharmacy) is medication to be sent to? [facility] on [address]  3. Do they need a 30 day or 90 day supply? 90

## 2020-04-14 NOTE — Telephone Encounter (Signed)
amiodarone (PACERONE) 200 MG tablet 90 tablet 0 04/14/2020    Sig - Route: Take 1 tablet (200 mg total) by mouth daily. - Oral   Sent to pharmacy as: amiodarone (PACERONE) 200 MG tablet   E-Prescribing Status: Sent to pharmacy (04/14/2020  1:21 PM EST)     Pharmacy  [id number] - [place], [place] - [address]

## 2020-04-17 ENCOUNTER — Ambulatory Visit: Payer: Medicare Other | Admitting: Internal Medicine

## 2020-05-02 ENCOUNTER — Ambulatory Visit: Payer: Medicare Other | Admitting: Cardiovascular Disease

## 2020-05-15 ENCOUNTER — Ambulatory Visit: Payer: Medicare Other | Admitting: Internal Medicine

## 2020-06-19 ENCOUNTER — Ambulatory Visit: Payer: Medicare Other | Admitting: Internal Medicine

## 2020-07-13 ENCOUNTER — Other Ambulatory Visit: Payer: Self-pay | Admitting: Cardiovascular Disease

## 2020-07-24 ENCOUNTER — Other Ambulatory Visit: Payer: Self-pay | Admitting: Surgery

## 2020-07-28 ENCOUNTER — Other Ambulatory Visit: Payer: Self-pay

## 2020-07-28 ENCOUNTER — Encounter
Admission: RE | Admit: 2020-07-28 | Discharge: 2020-07-28 | Disposition: A | Discharge status: Home / Self Care | Payer: Medicare Other | Source: Ambulatory Visit | Attending: Surgery | Admitting: Surgery

## 2020-07-28 DIAGNOSIS — Z01818 Encounter for other preprocedural examination: Secondary | ICD-10-CM | POA: Diagnosis not present

## 2020-07-28 HISTORY — DX: Hypothyroidism, unspecified: E03.9

## 2020-07-28 HISTORY — DX: Chronic kidney disease, unspecified: N18.9

## 2020-07-28 LAB — URINALYSIS, ROUTINE W REFLEX MICROSCOPIC
Bacteria, UA: NONE SEEN
Bilirubin Urine: NEGATIVE
Glucose, UA: NEGATIVE mg/dL
Hgb urine dipstick: NEGATIVE
Ketones, ur: NEGATIVE mg/dL
Nitrite: NEGATIVE
Protein, ur: NEGATIVE mg/dL
Specific Gravity, Urine: 1.006 (ref 1.005–1.030)
pH: 6 (ref 5.0–8.0)

## 2020-07-28 LAB — CBC WITH DIFFERENTIAL/PLATELET
Abs Immature Granulocytes: 0.01 10*3/uL (ref 0.00–0.07)
Basophils Absolute: 0 10*3/uL (ref 0.0–0.1)
Basophils Relative: 1 %
Eosinophils Absolute: 0.1 10*3/uL (ref 0.0–0.5)
Eosinophils Relative: 2 %
HCT: 38.1 % (ref 36.0–46.0)
Hemoglobin: 11.9 g/dL — ABNORMAL LOW (ref 12.0–15.0)
Immature Granulocytes: 0 %
Lymphocytes Relative: 21 %
Lymphs Abs: 1.3 10*3/uL (ref 0.7–4.0)
MCH: 29.2 pg (ref 26.0–34.0)
MCHC: 31.2 g/dL (ref 30.0–36.0)
MCV: 93.4 fL (ref 80.0–100.0)
Monocytes Absolute: 0.5 10*3/uL (ref 0.1–1.0)
Monocytes Relative: 8 %
Neutro Abs: 4.4 10*3/uL (ref 1.7–7.7)
Neutrophils Relative %: 68 %
Platelets: 256 10*3/uL (ref 150–400)
RBC: 4.08 MIL/uL (ref 3.87–5.11)
RDW: 15.8 % — ABNORMAL HIGH (ref 11.5–15.5)
WBC: 6.4 10*3/uL (ref 4.0–10.5)
nRBC: 0 % (ref 0.0–0.2)

## 2020-07-28 LAB — COMPREHENSIVE METABOLIC PANEL
ALT: 18 U/L (ref 0–44)
AST: 18 U/L (ref 15–41)
Albumin: 3.7 g/dL (ref 3.5–5.0)
Alkaline Phosphatase: 59 U/L (ref 38–126)
Anion gap: 6 (ref 5–15)
BUN: 28 mg/dL — ABNORMAL HIGH (ref 8–23)
CO2: 26 mmol/L (ref 22–32)
Calcium: 9.1 mg/dL (ref 8.9–10.3)
Chloride: 109 mmol/L (ref 98–111)
Creatinine, Ser: 1.38 mg/dL — ABNORMAL HIGH (ref 0.44–1.00)
GFR, Estimated: 42 mL/min — ABNORMAL LOW (ref >60)
Glucose, Bld: 117 mg/dL — ABNORMAL HIGH (ref 70–99)
Potassium: 4.2 mmol/L (ref 3.5–5.1)
Sodium: 141 mmol/L (ref 135–145)
Total Bilirubin: 0.7 mg/dL (ref 0.3–1.2)
Total Protein: 7.4 g/dL (ref 6.5–8.1)

## 2020-07-28 LAB — TYPE AND SCREEN
ABO/RH(D): A POS
Antibody Screen: NEGATIVE

## 2020-07-28 LAB — SURGICAL PCR SCREEN
MRSA, PCR: NEGATIVE
Staphylococcus aureus: POSITIVE — AB

## 2020-07-28 NOTE — Patient Instructions (Addendum)
Your procedure is scheduled on: 08/07/20 Report to DAY SURGERY DEPARTMENT LOCATED ON 2ND FLOOR MEDICAL MALL ENTRANCE. To find out your arrival time please call [phone] between 1PM - 3PM on 08/06/20.  Remember: Instructions that are not followed completely may result in serious medical risk, up to and including death, or upon the discretion of your surgeon and anesthesiologist your surgery may need to be rescheduled.     _X__ 1. Do not eat food after midnight the night before your procedure.                 No gum chewing or hard candies. You may drink clear liquids up to 2 hours                 before you are scheduled to arrive for your surgery- DO not drink clear                 liquids within 2 hours of the start of your surgery.                 Clear Liquids include:  water, apple juice without pulp, clear carbohydrate                 drink such as Clearfast or Gatorade, Black Coffee or Tea (Do not add                 anything to coffee or tea). Diabetics water only  __X__2.  On the morning of surgery brush your teeth with toothpaste and water, you                 may rinse your mouth with mouthwash if you wish.  Do not swallow any              toothpaste of mouthwash.     _X__ 3.  No Alcohol for 24 hours before or after surgery.   _X__ 4.  Do Not Smoke or use e-cigarettes For 24 Hours Prior to Your Surgery.                 Do not use any chewable tobacco products for at least 6 hours prior to                 surgery.  ____  5.  Bring all medications with you on the day of surgery if instructed.   __X__  6.  Notify your doctor if there is any change in your medical condition      (cold, fever, infections).     Do not wear jewelry, make-up, hairpins, clips or nail polish. Do not wear lotions, powders, or perfumes.  Do not shave 48 hours prior to surgery. Men may shave face and neck. Do not bring valuables to the hospital.    [facility] is not responsible for any belongings or  valuables.  Contacts, dentures/partials or body piercings may not be worn into surgery. Bring a case for your contacts, glasses or hearing aids, a denture cup will be supplied. Leave your suitcase in the car. After surgery it may be brought to your room. For patients admitted to the hospital, discharge time is determined by your treatment team.   Patients discharged the day of surgery will not be allowed to drive home.   Please read over the following fact sheets that you were given:   MRSA Information, chg soap, incentive spirometer  __X__ Take these medicines the morning of surgery with A SIP  OF WATER:    1. amiodarone (PACERONE) 200 MG tablet  2. carvedilol (COREG) 3.125 MG tablet  3. esomeprazole (NEXIUM) 40 MG capsule  4. levothyroxine (SYNTHROID) 100 MCG tablet  5. magnesium oxide (MAG-OX) 400 (241.3 Mg) MG tablet  6. venlafaxine (EFFEXOR) 37.5 MG tablet  7.   ____ Fleet Enema (as directed)   __X__ Use CHG Soap/SAGE wipes as directed  ____ Use inhalers on the day of surgery  ____ Stop metformin/Janumet/Farxiga 2 days prior to surgery    ____ Take 1/2 of usual insulin dose the night before surgery. No insulin the morning          of surgery.   __X__ Stop Blood Thinners Coumadin/Plavix/Xarelto/Pleta/Pradaxa/Eliquis/Effient/Aspirin  on   Or contact your Surgeon, Cardiologist or Medical Doctor regarding  ability to stop your blood thinners  __X__ Stop Anti-inflammatories 7 days before surgery such as Advil, Ibuprofen, Motrin,  BC or Goodies Powder, Naprosyn, Naproxen, Aleve, Aspirin    __X__ Stop all herbal supplements, fish oil or vitamin E until after surgery. DO NOT START ANYTHING NEW   __X__ Bring C-Pap to the hospital.

## 2020-07-29 ENCOUNTER — Encounter: Payer: Self-pay | Admitting: Cardiovascular Disease

## 2020-07-29 ENCOUNTER — Ambulatory Visit (INDEPENDENT_AMBULATORY_CARE_PROVIDER_SITE_OTHER): Payer: Medicare Other | Admitting: Cardiovascular Disease

## 2020-07-29 VITALS — BP 120/70 | HR 69 | Ht 63.0 in | Wt 240.0 lb

## 2020-07-29 DIAGNOSIS — Z01810 Encounter for preprocedural cardiovascular examination: Secondary | ICD-10-CM

## 2020-07-29 DIAGNOSIS — I5042 Chronic combined systolic (congestive) and diastolic (congestive) heart failure: Secondary | ICD-10-CM

## 2020-07-29 DIAGNOSIS — I4819 Other persistent atrial fibrillation: Secondary | ICD-10-CM | POA: Diagnosis not present

## 2020-07-29 DIAGNOSIS — Z9989 Dependence on other enabling machines and devices: Secondary | ICD-10-CM

## 2020-07-29 DIAGNOSIS — I10 Essential (primary) hypertension: Secondary | ICD-10-CM

## 2020-07-29 DIAGNOSIS — G4733 Obstructive sleep apnea (adult) (pediatric): Secondary | ICD-10-CM

## 2020-07-29 MED ORDER — FUROSEMIDE 20 MG PO TABS: 20.0000 mg | ORAL_TABLET | Freq: Every day | ORAL | 5 refills | Status: DC

## 2020-07-29 NOTE — Patient Instructions (Signed)
Medication Instructions:  Your physician has recommended you make the following change in your medication:   REDUCE Lasix to 20 mg daily.  *If you need a refill on your cardiac medications before your next appointment, please call your pharmacy*   Lab Work: None ordered If you have labs (blood work) drawn today and your tests are completely normal, you will receive your results only by: Marland Kitchen MyChart Message (if you have MyChart) OR . A paper copy in the mail If you have any lab test that is abnormal or we need to change your treatment, we will call you to review the results.   Testing/Procedures: None ordered   Follow-Up: At [facility], you and your health needs are our priority.  As part of our continuing mission to provide you with exceptional heart care, we have created designated Provider Care Teams.  These Care Teams include your primary Cardiologist (physician) and Advanced Practice Providers (APPs -  Physician Assistants and Nurse Practitioners) who all work together to provide you with the care you need, when you need it.  We recommend signing up for the patient portal called "MyChart".  Sign up information is provided on this After Visit Summary.  MyChart is used to connect with patients for Virtual Visits (Telemedicine).  Patients are able to view lab/test results, encounter notes, upcoming appointments, etc.  Non-urgent messages can be sent to your provider as well.   To learn more about what you can do with MyChart, go to [email or web address].    Your next appointment:   Your physician wants you to follow-up in: 6 months You will receive a reminder letter in the mail two months in advance. If you don't receive a letter, please call our office to schedule the follow-up appointment.   The format for your next appointment:   In Person  Provider:   You may see [name], MD or one of the following Advanced Practice Providers on your designated Care Team:     Nicolasa Ducking, NP  Eula Listen, PA-C  Marisue Ivan, PA-C  Cadence Fransico Michael, [place]  Gillian Shields, NP    Other Instructions OK to HOLD Eliquis 48 hours prior to your knee surgery. Resume when the surgeon feels it is safe.

## 2020-07-29 NOTE — Progress Notes (Signed)
Cardiology Office Note   Date:  07/29/2020   ID:  DREAMER CARILLO, DOB [birth year], MRN [id number]  PCP:  Marguarite Arbour, MD  Cardiologist:   [name], MD   Chief Complaint  Patient presents with  . Other    1 month f/u cardiac clearance. Meds reviewed verbally with pt.      History of Present Illness: SHAUNDA TIPPING is a 70 y.o. female who presents for a follow-up visit regarding persistent atrial fibrillation and chronic diastolic heart failure. She has known history of  HTN and depression.   Cardiac cath in 2015 showed mild nonobstructive coronary artery disease. She uses a CPAP regularly for sleep apnea. She failed multiple antiarrhythmic medications including flecainide.  Dofetilide was associated with QT prolongation and had to be discontinued.   Most recent cardioversion was July of last year. She did have decreased ejection fraction in the setting of A. fib with RVR but subsequently her EF improved to normal. She fell in June of 2021 and fractured her right ankle which required surgery.  She was discharged to rehab but was readmitted due to respiratory failure, pulmonary edema and E. coli bacteremia.  CT abdomen showed cholelithiasis which might have been responsible for source of infection.  She improved with antibiotics.  During that hospitalization, she had acute kidney injury and abnormal liver enzymes.  Due to that, amiodarone was discontinued at that time.  Amiodarone was subsequently resumed at 100 mg once daily but she developed recurrent A. fib with RVR.  The dose of amiodarone was increased back to 200 mg daily and she underwent successful cardioversion in October.  She has been maintaining sinus rhythm since then.  Subsequently, she was noted to have prolonged QT interval above 500 ms.  Labs showed mildly low magnesium at 1.9.  She was placed on magnesium oxide and has been taking it.  QT interval has normalized since then.  She has been doing well with no  recent palpitations or worsening dyspnea.  She is scheduled for left knee replacement next week.   Past Medical History:  Diagnosis Date  . Anxiety   . Arthritis    "knees" (09/26/2017)  . Bacteremia    a.09/2019 E. coli bacteremia - unclear source.  . 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 Dx 08/2013 in setting of rapid afib;  b. 08/2013 Echo: EF 55-60%, mildly dil LA, nl RV; c. 08/2017 Echo: EF 20-25% (in afib); d. 11/2017 Echo: EF 50-55%; e. 09/2019 Echo: EF 55-60%, no rwma.  . Chronic kidney disease    CKD II or III  . 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 GERD (gastroesophageal reflux disease)   . High cholesterol   . History of blood transfusion 1975   "related to ectopic pregnancy  . Hypertension   . Hypothyroidism   . Non-obstructive CAD    a. 10/2013 Myoview: anteroseptal, apical, septal mild ischemia, nl LV fxn;  b. Cath: LAD 21m LCX 20ost, RCA 60m.  . Obesity   . OSA on CPAP   . Paroxysmal atrial fibrillation (HCC)    a. Dx 08/2013. CHA2DS2VASc = 4 (Eliquis); b. Prev on flecainide w/ multiple DCCVs; c. 09/2017 Tikosyn added-->TdP 2/2 prolonged QT->amio; d. 11/2018 s/p DCCV-amio cont; e. 01/2020 recurrent Afib on low-dose amio-->DCCV (200J x 3), Amio cont @ 200 Daily.  . Pneumonia    "1-2 times" (09/26/2017)  . Tachycardia induced cardiomyopathy (HCC)    a. 08/2017 Echo:  EF 20-25%, diff HK in setting of Afib w/ RVR; b. 11/2017 Echo: EF 50-55%; c. 09/2019 Echo: EF 55-60%.    Past Surgical History:  Procedure Laterality Date  . BREAST BIOPSY Right 2007   stereo-neg  . CARDIAC CATHETERIZATION  10/2013   [facility]  . CARDIOVERSION N/A 08/29/2017   Procedure: CARDIOVERSION;  Surgeon: Iran Ouch, MD;  Location: [facility];  Service: Cardiovascular;  Laterality: N/A;  . CARDIOVERSION     "I've had a total of 4" (09/26/2017)  . CARDIOVERSION N/A 12/08/2018   Procedure: CARDIOVERSION;  Surgeon: Antonieta Iba, MD;  Location: [facility];  Service:  Cardiovascular;  Laterality: N/A;  . CARDIOVERSION N/A 02/18/2020   Procedure: CARDIOVERSION;  Surgeon: Iran Ouch, MD;  Location: [facility];  Service: Cardiovascular;  Laterality: N/A;  . ECTOPIC PREGNANCY SURGERY  1975  . ORIF ANKLE FRACTURE Right 10/01/2019   Procedure: OPEN REDUCTION INTERNAL FIXATION (ORIF) ANKLE FRACTURE;  Surgeon: Christena Flake, MD;  Location: [facility];  Service: Orthopedics;  Laterality: Right;  . TONSILLECTOMY       Current Outpatient Medications  Medication Sig Dispense Refill  . amiodarone (PACERONE) 200 MG tablet Take 200 mg by mouth daily.    . carvedilol (COREG) 3.125 MG tablet Take 1 tablet (3.125 mg total) by mouth 2 (two) times daily. 180 tablet 3  . diclofenac sodium (VOLTAREN) 1 % GEL Apply 1 application topically 4 (four) times daily as needed (knee pain).    Marland Kitchen ELIQUIS 5 MG TABS tablet TAKE 1 TABLET(5 MG) BY MOUTH TWICE DAILY 60 tablet 6  . ENTRESTO 49-51 MG TAKE 1 TABLET BY MOUTH TWICE DAILY 180 tablet 3  . esomeprazole (NEXIUM) 40 MG capsule TAKE 1 CAPSULE BY MOUTH EVERY MORNING 30 capsule 3  . furosemide (LASIX) 40 MG tablet Take 40 mg by mouth daily.    Marland Kitchen HYDROcodone-acetaminophen (NORCO) 10-325 MG tablet Take 1 tablet by mouth every 6 (six) hours as needed for moderate pain or severe pain.     Marland Kitchen levothyroxine (SYNTHROID) 100 MCG tablet Take 100 mcg by mouth daily before breakfast.    . magnesium oxide (MAG-OX) 400 MG tablet Take 400 mg by mouth daily.    . ondansetron (ZOFRAN) 4 MG tablet Take 4 mg by mouth every 8 (eight) hours as needed for nausea or vomiting.    . rosuvastatin (CRESTOR) 10 MG tablet Take 10 mg by mouth at bedtime.    Marland Kitchen venlafaxine (EFFEXOR) 37.5 MG tablet Take 37.5 mg by mouth 2 (two) times daily.    Marland Kitchen zolpidem (AMBIEN) 10 MG tablet Take 10 mg by mouth at bedtime.     No current facility-administered medications for this visit.    Allergies:   Levaquin [levofloxacin in d5w], Metoprolol, Tikosyn [dofetilide], Ace  inhibitors, and Adhesive [tape]    Social History:  The patient  reports that she has never smoked. She has never used smokeless tobacco. She reports current alcohol use of about 7.0 standard drinks of alcohol per week. She reports that she does not use drugs.   Family History:  The patient's family history includes Breast cancer (age of onset: 53) in her mother; CAD in her father; Heart attack in her father; Hypertension in her mother.    ROS:  Please see the history of present illness.   Otherwise, review of systems are positive for none.   All other systems are reviewed and negative.    PHYSICAL EXAM: VS:  BP 120/70 (BP Location: Left Arm,  Patient Position: Sitting, Cuff Size: Large)   Pulse 69   Ht 5\' 3"  (1.6 m)   Wt 240 lb (108.9 kg)   SpO2 96%   BMI 42.51 kg/m  , BMI Body mass index is 42.51 kg/m. GEN: Well nourished, well developed, in no acute distress  HEENT: normal  Neck: no JVD, carotid bruits, or masses Cardiac: Regular rate and rhythm.; no  rubs, or gallops,no edema . 1/6 systolic ejection murmur in the aortic area Respiratory:  clear to auscultation bilaterally, normal work of breathing GI: soft, nontender, nondistended, + BS MS: no deformity or atrophy  Skin: warm and dry, no rash Neuro:  Strength and sensation are intact Psych: euthymic mood, full affect   EKG:  EKG is ordered today. The ekg ordered today demonstrates normal sinus rhythm with nonspecific T wave changes.  Normal QT interval with QTC of 480.  Recent Labs: 10/11/2019: B Natriuretic Peptide 1,800.8; TSH 0.530 04/04/2020: Magnesium 1.9 07/28/2020: ALT 18; BUN 28; Creatinine, Ser 1.38; Hemoglobin 11.9; Platelets 256; Potassium 4.2; Sodium 141    Lipid Panel    Component Value Date/Time   CHOL 194 08/28/2013 0352   TRIG 122 08/28/2013 0352   HDL 56 08/28/2013 0352   VLDL 24 08/28/2013 0352   LDLCALC 114 (H) 08/28/2013 0352      Wt Readings from Last 3 Encounters:  07/29/20 240 lb (108.9 kg)   07/28/20 240 lb (108.9 kg)  03/26/20 244 lb 8 oz (110.9 kg)         ASSESSMENT AND PLAN:  1.  Persistent atrial fibrillation: She is maintaining in sinus rhythm with amiodarone 200 mg once daily.  She is on long-term anticoagulation with Eliquis.  QT interval normalized with addition of magnesium.    2. Chronic systolic/diastolic heart failure: Low EF in the past in the setting of atrial fibrillation improved to normal on most recent echocardiogram in June of last year.  Continue treatment with carvedilol and Entresto.  Recent labs showed mildly elevated BUN and creatinine.  I elected to decrease furosemide to 20 mg daily.  3. Obstructive sleep apnea: Doing well with CPAP but needs to establish with a new sleep medicine physician.  4.  Essential hypertension: Blood pressure is controlled.  5.  Preop cardiovascular evaluation for knee replacement: The patient has no anginal symptoms and seems to be stable from a cardiac standpoint.  She can proceed with surgery at an overall moderate risk.  No need for further cardiac testing.  Eliquis can be held 2 days before surgery and resume as soon as safe from a bleeding standpoint.    Disposition:   FU with me in 6 months.  Signed,  July, MD  07/29/2020 3:33 PM    [facility]

## 2020-07-31 ENCOUNTER — Encounter: Payer: Self-pay | Admitting: Surgery

## 2020-07-31 NOTE — Progress Notes (Signed)
Perioperative Services  Pre-Admission/Anesthesia Testing Clinical Review  Date: 07/31/20  Patient Demographics:  Name: Stacey [name] DOB:   Jul 27, 1950 MRN:   [id number]  Planned Surgical Procedure(s):    Case: [id number] Date/Time: 08/07/20 0715   Procedure: TOTAL KNEE ARTHROPLASTY (Left Knee)   Anesthesia type: Choice   Pre-op diagnosis: PRIMARY OSTEOARTHRITIS OF LEFT KNEE.   Location: [facility] OR ROOM 03 / [facility]   Surgeons: Christena Flake, MD    NOTE: Available PAT nursing documentation and vital signs have been reviewed. Clinical nursing staff has updated patient's PMH/PSHx, current medication list, and drug allergies/intolerances to ensure comprehensive history available to assist in medical decision making as it pertains to the aforementioned surgical procedure and anticipated anesthetic course.   Clinical Discussion:  Stacey [name] is a 70 y.o. female who is submitted for pre-surgical anesthesia review and clearance prior to her undergoing the above procedure. Patient has never been a smoker. Pertinent PMH includes: CAD, atrial fibrillation, HFimpEF, tachycardia induced cardiomyopathy, drug-induced torsades, aortic atherosclerosis, cardiac murmur, HTN, HLD, hypothyroidism, OSAH (requires nocturnal PAP therapy), CKD-III, GERD (on daily PPI), OA, anxiety.  Patient is followed by cardiology Kirke Corin, MD). She was last seen in the cardiology clinic on 07/29/2020; notes reviewed.  At the time of her clinic visit, patient doing well overall from a cardiovascular perspective.  She denied any chest pain, shortness of breath, PND, orthopnea, palpitations, significant peripheral edema, vertiginous symptoms, or presyncope/syncope.  PMH (+) for cardiovascular diagnoses.  Patient underwent diagnostic left heart catheterization in 10/2013 that revealed nonobstructive CAD; 20% stenosis mid LAD, 20% stenosis ostial LCx, 20% stenosis of the mid RCA, and 20% stenosis of the distal  RCA.  Patient also with a history of atrial fibrillation. CHA2DS2-VASc Score = 5 (age, sex, CHF, HTN, aortic plaque). Patient chronically anticoagulated using apixaban; compliant with therapy with no evidence of GI bleeding. Patient with history of reduced LVEF in the setting of atrial fibrillation with RVR.  Last TTE performed on 10/11/2019 revealed normal left ventricular systolic function with improvement of her EF to 55-60% (see full interpretation of cardiovascular testing below).  Patient has undergone multiple DCCV procedures in the past, with the last being in 10/2019.  Patient has failed multiple antiarrhythmic medications including flecainide and dofetilide.  Patient developed prolonged QT and subsequent torsades while on flecainide and dofetilide; transition to amiodarone.  QTc stabilized and amiodarone dose uptitrated.  Rate and rhythm currently maintained using 200 mg daily dose of amiodarone.  Patient is on GDMT for her HTN and HLD diagnoses.  Blood pressure and heart failure symptoms well controlled well controlled at 120/70 on currently prescribed beta-blocker, diuretic, ARB.ARNI therapies.  Patient is on a statin for her HLD.  She has OSAH and is compliant with prescribed nocturnal PAP therapy.  Functional capacity limited by orthopedic pain, however patient felt to be able to achieve at least 4 METS of activity.  Recent labs show a mildly elevated BUN and creatinine, furosemide dose was decreased.  No other changes were made to her medication regimen.  Patient scheduled follow-up with outpatient cardiology in 6 months or sooner if needed.  Patient is scheduled to undergo an elective orthopedic procedure on 08/07/2020 with Dr. Leron Croak.  Given patient's past medical history significant for cardiovascular disease and intervention, presurgical cardiac clearance was sought by PAT team.  Per cardiology, "the patient has no anginal symptoms and seems to be stable from a cardiac standpoint.  She can  proceed with surgery  with overall MODERATE risk.  No need for further cardiovascular testing at this time".  Again, this patient is on daily anticoagulation therapy.  Cardiology recommended the patient hold her apixaban for 2 days prior to her procedure with plans to restart as soon as postoperative bleeding risk not be minimized by primary attending surgeon.  The patient is aware that her last dose of apixaban should be on 08/04/2020  Patient denies previous perioperative complications with anesthesia in the past. In review of the available records, it is noted that patient underwent a general anesthetic course here (ASA III) in 01/2020 without documented complications.   Vitals with BMI 07/29/2020 07/28/2020 03/26/2020  Height 5\' 3"  5\' 3"  5\' 3"   Weight 240 lbs 240 lbs 244 lbs 8 oz  BMI 42.52 42.52 43.32  Systolic 120 154 [id number]  Diastolic 70 66 82  Pulse 69 61 66    Providers/Specialists:   NOTE: Primary physician provider listed below. Patient may have been seen by APP or partner within same practice.   PROVIDER ROLE / SPECIALTY LAST OV  Poggi, Excell SeltzerJohn J, MD Orthopedics (Surgeon)  06/23/2020  Marguarite ArbourSparks, Jeffrey D, MD Primary Care Provider  06/24/2020  Eldridge DaceArida, Muhammed, MD Cardiology  07/29/2020   Allergies:  Levaquin [levofloxacin in d5w], Metoprolol, Tikosyn [dofetilide], Ace inhibitors, and Adhesive [tape]  Current Home Medications:   No current facility-administered medications for this encounter.   . carvedilol (COREG) 3.125 MG tablet  . diclofenac sodium (VOLTAREN) 1 % GEL  . ELIQUIS 5 MG TABS tablet  . ENTRESTO 49-51 MG  . esomeprazole (NEXIUM) 40 MG capsule  . HYDROcodone-acetaminophen (NORCO) 10-325 MG tablet  . levothyroxine (SYNTHROID) 100 MCG tablet  . ondansetron (ZOFRAN) 4 MG tablet  . rosuvastatin (CRESTOR) 10 MG tablet  . venlafaxine (EFFEXOR) 37.5 MG tablet  . zolpidem (AMBIEN) 10 MG tablet  . amiodarone (PACERONE) 200 MG tablet  . furosemide (LASIX) 20 MG tablet  .  magnesium oxide (MAG-OX) 400 MG tablet   History:   Past Medical History:  Diagnosis Date  . Anxiety   . Arthritis    "knees" (09/26/2017)  . Bacteremia    a.09/2019 E. coli bacteremia - unclear source.  . 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 Dx 08/2013 in setting of rapid afib;  b. 08/2013 Echo: EF 55-60%, mildly dil LA, nl RV; c. 08/2017 Echo: EF 20-25% (in afib); d. 11/2017 Echo: EF 50-55%; e. 09/2019 Echo: EF 55-60%, no rwma.  . Chronic kidney disease    CKD II or III  . 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 GERD (gastroesophageal reflux disease)   . High cholesterol   . History of blood transfusion 1975   "related to ectopic pregnancy  . Hypertension   . Hypothyroidism   . Non-obstructive CAD    a. 10/2013 Myoview: anteroseptal, apical, septal mild ischemia, nl LV fxn;  b. Cath: LAD 2893m LCX 20ost, RCA 8193m.  . Obesity   . OSA on CPAP   . Paroxysmal atrial fibrillation (HCC)    a. Dx 08/2013. CHA2DS2VASc = 4 (Eliquis); b. Prev on flecainide w/ multiple DCCVs; c. 09/2017 Tikosyn added-->TdP 2/2 prolonged QT->amio; d. 11/2018 s/p DCCV-amio cont; e. 01/2020 recurrent Afib on low-dose amio-->DCCV (200J x 3), Amio cont @ 200 Daily.  . Pneumonia    "1-2 times" (09/26/2017)  . Tachycardia induced cardiomyopathy (HCC)    a. 08/2017 Echo: EF 20-25%, diff HK in setting of Afib w/ RVR; b. 11/2017 Echo: EF  50-55%; c. 09/2019 Echo: EF 55-60%.   Past Surgical History:  Procedure Laterality Date  . BREAST BIOPSY Right 2007   stereo-neg  . CARDIAC CATHETERIZATION  10/2013   [facility]  . CARDIOVERSION N/A 08/29/2017   Procedure: CARDIOVERSION;  Surgeon: Iran Ouch, MD;  Location: [facility] ORS;  Service: Cardiovascular;  Laterality: N/A;  . CARDIOVERSION     "I've had a total of 4" (09/26/2017)  . CARDIOVERSION N/A 12/08/2018   Procedure: CARDIOVERSION;  Surgeon: Antonieta Iba, MD;  Location: [facility] ORS;  Service: Cardiovascular;  Laterality: N/A;  . CARDIOVERSION N/A  02/18/2020   Procedure: CARDIOVERSION;  Surgeon: Iran Ouch, MD;  Location: [facility] ORS;  Service: Cardiovascular;  Laterality: N/A;  . ECTOPIC PREGNANCY SURGERY  1975  . ORIF ANKLE FRACTURE Right 10/01/2019   Procedure: OPEN REDUCTION INTERNAL FIXATION (ORIF) ANKLE FRACTURE;  Surgeon: Christena Flake, MD;  Location: [facility] ORS;  Service: Orthopedics;  Laterality: Right;  . TONSILLECTOMY     Family History  Problem Relation Age of Onset  . CAD Father        s/p cabg - ? h/o afib.  Marland Kitchen Heart attack Father   . Hypertension Mother   . Breast cancer Mother 37   Social History   Tobacco Use  . Smoking status: Never Smoker  . Smokeless tobacco: Never Used  Vaping Use  . Vaping Use: Never used  Substance Use Topics  . Alcohol use: Yes    Alcohol/week: 7.0 standard drinks    Types: 7 Glasses of wine per week    Comment: 1 glass of wine/day.  . Drug use: Never    Pertinent Clinical Results:  LABS: Labs reviewed: Acceptable for surgery.  No visits with results within 3 Day(s) from this visit.  Latest known visit with results is:  Hospital Outpatient Visit on 07/28/2020  Component Date Value Ref Range Status  . MRSA, PCR 07/28/2020 NEGATIVE  NEGATIVE Final  . Staphylococcus aureus 07/28/2020 POSITIVE* NEGATIVE Final   Comment: (NOTE) The Xpert SA Assay (FDA approved for NASAL specimens in patients 74 years of age and older), is one component of a comprehensive surveillance program. It is not intended to diagnose infection nor to guide or monitor treatment. Performed at [facility], [address] [place], [place] [address]   . WBC 07/28/2020 6.4  4.0 - 10.5 K/uL Final  . RBC 07/28/2020 4.08  3.87 - 5.11 MIL/uL Final  . Hemoglobin 07/28/2020 11.9* 12.0 - 15.0 g/dL Final  . HCT 34/19/6222 38.1  36.0 - 46.0 % Final  . MCV 07/28/2020 93.4  80.0 - 100.0 fL Final  . MCH 07/28/2020 29.2  26.0 - 34.0 pg Final  . MCHC 07/28/2020 31.2  30.0 - 36.0 g/dL Final  . RDW 97/98/9211  15.8* 11.5 - 15.5 % Final  . Platelets 07/28/2020 256  150 - 400 K/uL Final  . nRBC 07/28/2020 0.0  0.0 - 0.2 % Final  . Neutrophils Relative % 07/28/2020 68  % Final  . Neutro Abs 07/28/2020 4.4  1.7 - 7.7 K/uL Final  . Lymphocytes Relative 07/28/2020 21  % Final  . Lymphs Abs 07/28/2020 1.3  0.7 - 4.0 K/uL Final  . Monocytes Relative 07/28/2020 8  % Final  . Monocytes Absolute 07/28/2020 0.5  0.1 - 1.0 K/uL Final  . Eosinophils Relative 07/28/2020 2  % Final  . Eosinophils Absolute 07/28/2020 0.1  0.0 - 0.5 K/uL Final  . Basophils Relative 07/28/2020 1  % Final  .  Basophils Absolute 07/28/2020 0.0  0.0 - 0.1 K/uL Final  . Immature Granulocytes 07/28/2020 0  % Final  . Abs Immature Granulocytes 07/28/2020 0.01  0.00 - 0.07 K/uL Final   Performed at [facility], [address] [place] [place], [place] [address]  . Sodium 07/28/2020 141  135 - 145 mmol/L Final  . Potassium 07/28/2020 4.2  3.5 - 5.1 mmol/L Final  . Chloride 07/28/2020 109  98 - 111 mmol/L Final  . CO2 07/28/2020 26  22 - 32 mmol/L Final  . Glucose, Bld 07/28/2020 117* 70 - 99 mg/dL Final   Glucose reference range applies only to samples taken after fasting for at least 8 hours.  . BUN 07/28/2020 28* 8 - 23 mg/dL Final  . Creatinine, Ser 07/28/2020 1.38* 0.44 - 1.00 mg/dL Final  . Calcium 58/12/9831 9.1  8.9 - 10.3 mg/dL Final  . Total Protein 07/28/2020 7.4  6.5 - 8.1 g/dL Final  . Albumin 82/50/5397 3.7  3.5 - 5.0 g/dL Final  . AST 67/34/1937 18  15 - 41 U/L Final  . ALT 07/28/2020 18  0 - 44 U/L Final  . Alkaline Phosphatase 07/28/2020 59  38 - 126 U/L Final  . Total Bilirubin 07/28/2020 0.7  0.3 - 1.2 mg/dL Final  . GFR, Estimated 07/28/2020 42* >60 mL/min Final   Comment: (NOTE) Calculated using the CKD-EPI Creatinine Equation (2021)   . Anion gap 07/28/2020 6  5 - 15 Final   Performed at [facility], [address] [place] [place], [place] [address]  . Color, Urine 07/28/2020 STRAW* YELLOW Final  .  APPearance 07/28/2020 CLEAR* CLEAR Final  . Specific Gravity, Urine 07/28/2020 1.006  1.005 - 1.030 Final  . pH 07/28/2020 6.0  5.0 - 8.0 Final  . Glucose, UA 07/28/2020 NEGATIVE  NEGATIVE mg/dL Final  . Hgb urine dipstick 07/28/2020 NEGATIVE  NEGATIVE Final  . Bilirubin Urine 07/28/2020 NEGATIVE  NEGATIVE Final  . Ketones, ur 07/28/2020 NEGATIVE  NEGATIVE mg/dL Final  . Protein, ur 97/35/3299 NEGATIVE  NEGATIVE mg/dL Final  . Nitrite 24/26/8341 NEGATIVE  NEGATIVE Final  . Leukocytes,Ua 07/28/2020 TRACE* NEGATIVE Final  . RBC / HPF 07/28/2020 0-5  0 - 5 RBC/hpf Final  . WBC, UA 07/28/2020 0-5  0 - 5 WBC/hpf Final  . Bacteria, UA 07/28/2020 NONE SEEN  NONE SEEN Final  . Squamous Epithelial / LPF 07/28/2020 0-5  0 - 5 Final  . Hyaline Casts, UA 07/28/2020 PRESENT   Final   Performed at [facility], [address] [place], [place] [address]  . ABO/RH(D) 07/28/2020 A POS   Final  . Antibody Screen 07/28/2020 NEG   Final  . Sample Expiration 07/28/2020 08/11/2020,2359   Final  . Extend sample reason 07/28/2020    Final                   Value:NO TRANSFUSIONS OR PREGNANCY IN THE PAST 3 MONTHS Performed at [facility], [address] [place], [place] [address]     ECG: Date: 07/28/2020 Time ECG obtained: 1026 AM Rate: 60 bpm Rhythm: normal sinus Axis (leads I and aVF): Normal Intervals: PR 192 ms. QRS 94 ms. QTc 460 ms. ST segment and T wave changes: Nonspecific T wave abnormalities  Comparison: When compared to tracing from 04/04/2020, QTc interval has shortened.   IMAGING / PROCEDURES: Echocardiogram performed on 10/11/2019 1. Left-ventricular ejection fraction by estimation is 55-60% 2. The left ventricle has normal function 3. The left ventricle  has no regional wall motion abnormalities 4. Left ventricular diastolic parameters were normal 5. Right ventricular systolic function is normal 6. The right ventricular size is normal 7. Tricuspid  regurgitation's signal is not adequate for assessing PA pressure 8. Challenging imaging quality due to poor echo windows and no parasternal window.  Image acquisition challenging due to patient body habitus.  LEFT HEART CATHETERIZATION AND CORONARY ANGIOGRAPHY performed on 11/20/2013 1. Nonobstructive CAD  20% stenosis mid LAD  20% stenosis ostial LCx  20% stenosis mid RCA  20% stenosis distal RCA  2. Hemodynamic assessment demonstrates no systemic hypertension and mild to moderate elevation in the patient's LVEDP 3. The patient was in A. fib with mild RVR during study.  EF was not assessed, however was noted to be 55% by echo 4. Recommend aggressive medical therapy including rhythm control for A. fib and chronic anticoagulation.  LEXISCAN performed on 11/14/2013 1. Global left ventricular function was normal 2. This myocardial perfusion scan showed evidence of pathology and has an abnormal appearance.  The stress to rest volume ratio is 1.17 for the left ventricle 3. Pharmacological myocardial perfusion study with moderate region of mild ischemia in the anteroseptal, apical, and septal territory 4. No artifacts noted 5. The overall quality of the study was deemed to be good  Impression and Plan:  Stacey [name] has been referred for pre-anesthesia review and clearance prior to her undergoing the planned anesthetic and procedural courses. Available labs, pertinent testing, and imaging results were personally reviewed by me. This patient has been appropriately cleared by cardiology with an overall MODERATE risk of significant perioperative cardiovascular complications.  Based on clinical review performed today (07/31/20), barring any significant acute changes in the patient's overall condition, it is anticipated that she will be able to proceed with the planned surgical intervention. Any acute changes in clinical condition may necessitate her procedure being postponed and/or cancelled.  Pre-surgical instructions were reviewed with the patient during her PAT appointment and questions were fielded by PAT clinical staff.  Quentin Mulling, MSN, APRN, FNP-C, CEN [facility]  Peri-operative Services Nurse Practitioner Phone: [phone] 07/31/20 12:36 PM  NOTE: This note has been prepared using Dragon dictation software. Despite my best ability to proofread, there is always the potential that unintentional transcriptional errors may still occur from this process.

## 2020-08-05 ENCOUNTER — Other Ambulatory Visit: Payer: Self-pay

## 2020-08-05 ENCOUNTER — Other Ambulatory Visit
Admission: RE | Admit: 2020-08-05 | Discharge: 2020-08-05 | Disposition: A | Discharge status: Home / Self Care | Payer: Medicare Other | Source: Ambulatory Visit | Attending: Surgery | Admitting: Surgery

## 2020-08-05 DIAGNOSIS — Z20822 Contact with and (suspected) exposure to covid-19: Secondary | ICD-10-CM | POA: Insufficient documentation

## 2020-08-05 DIAGNOSIS — Z01812 Encounter for preprocedural laboratory examination: Secondary | ICD-10-CM | POA: Insufficient documentation

## 2020-08-06 LAB — SARS CORONAVIRUS 2 (TAT 6-24 HRS): SARS Coronavirus 2: NEGATIVE

## 2020-08-07 ENCOUNTER — Inpatient Hospital Stay
Admission: RE | Admit: 2020-08-07 | Discharge: 2020-08-13 | DRG: 470 | Disposition: A | Discharge status: Skilled Nursing Facility | Payer: Medicare Other | Attending: Surgery | Admitting: Surgery

## 2020-08-07 ENCOUNTER — Inpatient Hospital Stay: Payer: Medicare Other

## 2020-08-07 ENCOUNTER — Ambulatory Visit: Payer: Medicare Other | Admitting: Urgent Care

## 2020-08-07 ENCOUNTER — Other Ambulatory Visit: Payer: Self-pay

## 2020-08-07 ENCOUNTER — Encounter
Admission: RE | Disposition: A | Discharge status: Skilled Nursing Facility | Payer: Self-pay | Source: Home / Self Care | Attending: Surgery

## 2020-08-07 ENCOUNTER — Encounter: Payer: Self-pay | Admitting: Surgery

## 2020-08-07 DIAGNOSIS — Z79899 Other long term (current) drug therapy: Secondary | ICD-10-CM

## 2020-08-07 DIAGNOSIS — I5042 Chronic combined systolic (congestive) and diastolic (congestive) heart failure: Secondary | ICD-10-CM | POA: Diagnosis present

## 2020-08-07 DIAGNOSIS — Z881 Allergy status to other antibiotic agents status: Secondary | ICD-10-CM | POA: Diagnosis not present

## 2020-08-07 DIAGNOSIS — Z8249 Family history of ischemic heart disease and other diseases of the circulatory system: Secondary | ICD-10-CM

## 2020-08-07 DIAGNOSIS — Z91048 Other nonmedicinal substance allergy status: Secondary | ICD-10-CM

## 2020-08-07 DIAGNOSIS — Z6841 Body Mass Index (BMI) 40.0 and over, adult: Secondary | ICD-10-CM | POA: Diagnosis not present

## 2020-08-07 DIAGNOSIS — E039 Hypothyroidism, unspecified: Secondary | ICD-10-CM | POA: Diagnosis present

## 2020-08-07 DIAGNOSIS — N183 Chronic kidney disease, stage 3 unspecified: Secondary | ICD-10-CM | POA: Diagnosis present

## 2020-08-07 DIAGNOSIS — E785 Hyperlipidemia, unspecified: Secondary | ICD-10-CM | POA: Diagnosis present

## 2020-08-07 DIAGNOSIS — Z9181 History of falling: Secondary | ICD-10-CM

## 2020-08-07 DIAGNOSIS — Z8342 Family history of familial hypercholesterolemia: Secondary | ICD-10-CM

## 2020-08-07 DIAGNOSIS — I428 Other cardiomyopathies: Secondary | ICD-10-CM

## 2020-08-07 DIAGNOSIS — M170 Bilateral primary osteoarthritis of knee: Secondary | ICD-10-CM | POA: Diagnosis present

## 2020-08-07 DIAGNOSIS — I4891 Unspecified atrial fibrillation: Secondary | ICD-10-CM | POA: Diagnosis not present

## 2020-08-07 DIAGNOSIS — G4733 Obstructive sleep apnea (adult) (pediatric): Secondary | ICD-10-CM | POA: Diagnosis present

## 2020-08-07 DIAGNOSIS — I4819 Other persistent atrial fibrillation: Secondary | ICD-10-CM | POA: Diagnosis present

## 2020-08-07 DIAGNOSIS — Z888 Allergy status to other drugs, medicaments and biological substances status: Secondary | ICD-10-CM

## 2020-08-07 DIAGNOSIS — I130 Hypertensive heart and chronic kidney disease with heart failure and stage 1 through stage 4 chronic kidney disease, or unspecified chronic kidney disease: Secondary | ICD-10-CM | POA: Diagnosis present

## 2020-08-07 DIAGNOSIS — M1712 Unilateral primary osteoarthritis, left knee: Secondary | ICD-10-CM | POA: Diagnosis present

## 2020-08-07 DIAGNOSIS — Z8701 Personal history of pneumonia (recurrent): Secondary | ICD-10-CM

## 2020-08-07 DIAGNOSIS — I2510 Atherosclerotic heart disease of native coronary artery without angina pectoris: Secondary | ICD-10-CM | POA: Diagnosis present

## 2020-08-07 DIAGNOSIS — K219 Gastro-esophageal reflux disease without esophagitis: Secondary | ICD-10-CM | POA: Diagnosis present

## 2020-08-07 DIAGNOSIS — Z818 Family history of other mental and behavioral disorders: Secondary | ICD-10-CM

## 2020-08-07 DIAGNOSIS — Z01810 Encounter for preprocedural cardiovascular examination: Secondary | ICD-10-CM | POA: Diagnosis not present

## 2020-08-07 DIAGNOSIS — Z7901 Long term (current) use of anticoagulants: Secondary | ICD-10-CM

## 2020-08-07 DIAGNOSIS — F419 Anxiety disorder, unspecified: Secondary | ICD-10-CM | POA: Diagnosis present

## 2020-08-07 DIAGNOSIS — Z20822 Contact with and (suspected) exposure to covid-19: Secondary | ICD-10-CM | POA: Diagnosis present

## 2020-08-07 DIAGNOSIS — Z79890 Hormone replacement therapy (postmenopausal): Secondary | ICD-10-CM | POA: Diagnosis not present

## 2020-08-07 DIAGNOSIS — I700 Atherosclerosis of aorta: Secondary | ICD-10-CM | POA: Diagnosis present

## 2020-08-07 DIAGNOSIS — I5022 Chronic systolic (congestive) heart failure: Secondary | ICD-10-CM | POA: Diagnosis not present

## 2020-08-07 DIAGNOSIS — Z96652 Presence of left artificial knee joint: Secondary | ICD-10-CM

## 2020-08-07 HISTORY — PX: TOTAL KNEE ARTHROPLASTY: SHX125

## 2020-08-07 HISTORY — DX: Atherosclerosis of aorta: I70.0

## 2020-08-07 HISTORY — DX: Chronic kidney disease, stage 3 unspecified: N18.30

## 2020-08-07 HISTORY — DX: Cardiac murmur, unspecified: R01.1

## 2020-08-07 HISTORY — DX: Hyperlipidemia, unspecified: E78.5

## 2020-08-07 SURGERY — ARTHROPLASTY, KNEE, TOTAL
Anesthesia: Spinal | Site: Knee | Laterality: Left

## 2020-08-07 MED ORDER — SACUBITRIL-VALSARTAN 49-51 MG PO TABS
1.0000 | ORAL_TABLET | Freq: Two times a day (BID) | ORAL | Status: DC
  Administered 2020-08-07 – 2020-08-11 (×9): 1 via ORAL
  Filled 2020-08-07 (×11): qty 1

## 2020-08-07 MED ORDER — CHLORHEXIDINE GLUCONATE 0.12 % MT SOLN
OROMUCOSAL | Status: AC
  Filled 2020-08-07: qty 15

## 2020-08-07 MED ORDER — PANTOPRAZOLE SODIUM 40 MG PO TBEC
40.0000 mg | DELAYED_RELEASE_TABLET | Freq: Every day | ORAL | Status: DC
  Administered 2020-08-08 – 2020-08-13 (×6): 40 mg via ORAL
  Filled 2020-08-07 (×6): qty 1

## 2020-08-07 MED ORDER — PROPOFOL 1000 MG/100ML IV EMUL
INTRAVENOUS | Status: AC
  Filled 2020-08-07: qty 100

## 2020-08-07 MED ORDER — SODIUM CHLORIDE 0.9 % IV SOLN
INTRAVENOUS | Status: DC | PRN
  Administered 2020-08-07: 25 ug/min via INTRAVENOUS

## 2020-08-07 MED ORDER — APIXABAN 5 MG PO TABS
5.0000 mg | ORAL_TABLET | Freq: Two times a day (BID) | ORAL | Status: DC
  Administered 2020-08-08 – 2020-08-13 (×11): 5 mg via ORAL
  Filled 2020-08-07 (×10): qty 1
  Filled 2020-08-07: qty 2

## 2020-08-07 MED ORDER — MIDAZOLAM HCL 2 MG/2ML IJ SOLN
INTRAMUSCULAR | Status: AC
  Filled 2020-08-07: qty 2

## 2020-08-07 MED ORDER — ONDANSETRON HCL 4 MG/2ML IJ SOLN
INTRAMUSCULAR | Status: DC | PRN
  Administered 2020-08-07: 4 mg via INTRAVENOUS

## 2020-08-07 MED ORDER — CEFAZOLIN SODIUM-DEXTROSE 2-4 GM/100ML-% IV SOLN
INTRAVENOUS | Status: AC
  Filled 2020-08-07: qty 100

## 2020-08-07 MED ORDER — KETOROLAC TROMETHAMINE 15 MG/ML IJ SOLN
7.5000 mg | Freq: Four times a day (QID) | INTRAMUSCULAR | Status: AC
  Administered 2020-08-07 – 2020-08-08 (×3): 7.5 mg via INTRAVENOUS
  Filled 2020-08-07 (×4): qty 1

## 2020-08-07 MED ORDER — SODIUM CHLORIDE 0.9 % IV SOLN
INTRAVENOUS | Status: DC | PRN
  Administered 2020-08-07: 60 mL

## 2020-08-07 MED ORDER — AMIODARONE HCL 200 MG PO TABS
200.0000 mg | ORAL_TABLET | Freq: Every day | ORAL | Status: DC
  Administered 2020-08-08 – 2020-08-11 (×4): 200 mg via ORAL
  Filled 2020-08-07 (×4): qty 1

## 2020-08-07 MED ORDER — SODIUM CHLORIDE 0.9 % IV SOLN: INTRAVENOUS | Status: DC

## 2020-08-07 MED ORDER — FUROSEMIDE 20 MG PO TABS
20.0000 mg | ORAL_TABLET | Freq: Every day | ORAL | Status: DC
  Administered 2020-08-08 – 2020-08-13 (×6): 20 mg via ORAL
  Filled 2020-08-07 (×7): qty 1

## 2020-08-07 MED ORDER — TRAMADOL HCL 50 MG PO TABS
50.0000 mg | ORAL_TABLET | Freq: Four times a day (QID) | ORAL | Status: DC | PRN
  Administered 2020-08-07 – 2020-08-13 (×15): 50 mg via ORAL
  Filled 2020-08-07 (×15): qty 1

## 2020-08-07 MED ORDER — CEFAZOLIN SODIUM-DEXTROSE 2-4 GM/100ML-% IV SOLN
2.0000 g | INTRAVENOUS | Status: AC
  Administered 2020-08-07: 2 g via INTRAVENOUS

## 2020-08-07 MED ORDER — METOCLOPRAMIDE HCL 10 MG PO TABS
5.0000 mg | ORAL_TABLET | Freq: Three times a day (TID) | ORAL | Status: DC | PRN
  Administered 2020-08-08 – 2020-08-13 (×9): 10 mg via ORAL
  Filled 2020-08-07 (×9): qty 1

## 2020-08-07 MED ORDER — TRANEXAMIC ACID 1000 MG/10ML IV SOLN
INTRAVENOUS | Status: DC | PRN
  Administered 2020-08-07: 1000 mg via TOPICAL

## 2020-08-07 MED ORDER — HYDROCODONE-ACETAMINOPHEN 10-325 MG PO TABS
1.0000 | ORAL_TABLET | ORAL | Status: DC | PRN
  Administered 2020-08-07 – 2020-08-13 (×25): 1 via ORAL
  Filled 2020-08-07 (×25): qty 1

## 2020-08-07 MED ORDER — FLEET ENEMA 7-19 GM/118ML RE ENEM: 1.0000 | ENEMA | Freq: Once | RECTAL | Status: DC | PRN

## 2020-08-07 MED ORDER — BUPIVACAINE-EPINEPHRINE (PF) 0.5% -1:200000 IJ SOLN
INTRAMUSCULAR | Status: DC | PRN
  Administered 2020-08-07: 30 mL

## 2020-08-07 MED ORDER — ZOLPIDEM TARTRATE 5 MG PO TABS
5.0000 mg | ORAL_TABLET | Freq: Every evening | ORAL | Status: DC | PRN
  Administered 2020-08-07: 5 mg via ORAL
  Filled 2020-08-07: qty 1

## 2020-08-07 MED ORDER — FENTANYL CITRATE (PF) 100 MCG/2ML IJ SOLN
INTRAMUSCULAR | Status: AC
  Filled 2020-08-07: qty 2

## 2020-08-07 MED ORDER — CHLORHEXIDINE GLUCONATE 0.12 % MT SOLN
15.0000 mL | Freq: Once | OROMUCOSAL | Status: AC
  Administered 2020-08-07: 15 mL via OROMUCOSAL

## 2020-08-07 MED ORDER — CARVEDILOL 3.125 MG PO TABS
3.1250 mg | ORAL_TABLET | Freq: Two times a day (BID) | ORAL | Status: DC
  Administered 2020-08-07 – 2020-08-11 (×8): 3.125 mg via ORAL
  Filled 2020-08-07 (×8): qty 1

## 2020-08-07 MED ORDER — MAGNESIUM OXIDE 400 (241.3 MG) MG PO TABS
400.0000 mg | ORAL_TABLET | Freq: Every day | ORAL | Status: DC
  Administered 2020-08-08 – 2020-08-13 (×6): 400 mg via ORAL
  Filled 2020-08-07 (×7): qty 1

## 2020-08-07 MED ORDER — TRANEXAMIC ACID 1000 MG/10ML IV SOLN
INTRAVENOUS | Status: AC
  Filled 2020-08-07: qty 10

## 2020-08-07 MED ORDER — KETOROLAC TROMETHAMINE 15 MG/ML IJ SOLN: 15.0000 mg | Freq: Once | INTRAMUSCULAR | Status: AC

## 2020-08-07 MED ORDER — BUPIVACAINE HCL (PF) 0.5 % IJ SOLN
INTRAMUSCULAR | Status: DC | PRN
  Administered 2020-08-07: 2.5 mL

## 2020-08-07 MED ORDER — OXYCODONE HCL 5 MG PO TABS: 5.0000 mg | ORAL_TABLET | Freq: Once | ORAL | Status: DC | PRN

## 2020-08-07 MED ORDER — LEVOTHYROXINE SODIUM 50 MCG PO TABS
100.0000 ug | ORAL_TABLET | Freq: Every day | ORAL | Status: DC
  Administered 2020-08-08 – 2020-08-13 (×6): 100 ug via ORAL
  Filled 2020-08-07 (×6): qty 2

## 2020-08-07 MED ORDER — FENTANYL CITRATE (PF) 100 MCG/2ML IJ SOLN: 25.0000 ug | INTRAMUSCULAR | Status: DC | PRN

## 2020-08-07 MED ORDER — ONDANSETRON HCL 4 MG PO TABS
4.0000 mg | ORAL_TABLET | Freq: Four times a day (QID) | ORAL | Status: DC | PRN
  Administered 2020-08-09 – 2020-08-13 (×4): 4 mg via ORAL
  Filled 2020-08-07 (×5): qty 1

## 2020-08-07 MED ORDER — BUPIVACAINE LIPOSOME 1.3 % IJ SUSP
INTRAMUSCULAR | Status: AC
  Filled 2020-08-07: qty 20

## 2020-08-07 MED ORDER — MORPHINE SULFATE (PF) 2 MG/ML IV SOLN
0.5000 mg | INTRAVENOUS | Status: DC | PRN
  Administered 2020-08-07: 1 mg via INTRAVENOUS
  Administered 2020-08-07: 0.5 mg via INTRAVENOUS
  Administered 2020-08-07 – 2020-08-08 (×5): 1 mg via INTRAVENOUS
  Filled 2020-08-07 (×7): qty 1

## 2020-08-07 MED ORDER — ONDANSETRON HCL 4 MG/2ML IJ SOLN
4.0000 mg | Freq: Four times a day (QID) | INTRAMUSCULAR | Status: DC | PRN
  Administered 2020-08-08 (×2): 4 mg via INTRAVENOUS
  Filled 2020-08-07 (×2): qty 2

## 2020-08-07 MED ORDER — BISACODYL 10 MG RE SUPP: 10.0000 mg | Freq: Every day | RECTAL | Status: DC | PRN

## 2020-08-07 MED ORDER — DIPHENHYDRAMINE HCL 12.5 MG/5ML PO ELIX
12.5000 mg | ORAL_SOLUTION | ORAL | Status: DC | PRN
Start: 2020-08-07 — End: 2020-08-13

## 2020-08-07 MED ORDER — METOCLOPRAMIDE HCL 5 MG/ML IJ SOLN: 5.0000 mg | Freq: Three times a day (TID) | INTRAMUSCULAR | Status: DC | PRN

## 2020-08-07 MED ORDER — LACTATED RINGERS IV SOLN: INTRAVENOUS | Status: DC

## 2020-08-07 MED ORDER — DEXMEDETOMIDINE (PRECEDEX) IN NS 20 MCG/5ML (4 MCG/ML) IV SYRINGE
PREFILLED_SYRINGE | INTRAVENOUS | Status: DC | PRN
  Administered 2020-08-07: 8 ug via INTRAVENOUS

## 2020-08-07 MED ORDER — HYDROCODONE-ACETAMINOPHEN 10-325 MG PO TABS: 1.0000 | ORAL_TABLET | ORAL | 0 refills | Status: AC | PRN

## 2020-08-07 MED ORDER — MIDAZOLAM HCL 5 MG/5ML IJ SOLN
INTRAMUSCULAR | Status: DC | PRN
  Administered 2020-08-07 (×2): 1 mg via INTRAVENOUS

## 2020-08-07 MED ORDER — CEFAZOLIN SODIUM-DEXTROSE 2-4 GM/100ML-% IV SOLN
2.0000 g | Freq: Four times a day (QID) | INTRAVENOUS | Status: AC
  Administered 2020-08-07 – 2020-08-08 (×3): 2 g via INTRAVENOUS
  Filled 2020-08-07 (×3): qty 100

## 2020-08-07 MED ORDER — ROSUVASTATIN CALCIUM 10 MG PO TABS
10.0000 mg | ORAL_TABLET | Freq: Every day | ORAL | Status: DC
  Administered 2020-08-07 – 2020-08-12 (×6): 10 mg via ORAL
  Filled 2020-08-07 (×6): qty 1

## 2020-08-07 MED ORDER — ONDANSETRON HCL 4 MG PO TABS: 4.0000 mg | ORAL_TABLET | Freq: Four times a day (QID) | ORAL | 0 refills | Status: DC | PRN

## 2020-08-07 MED ORDER — DOCUSATE SODIUM 100 MG PO CAPS
100.0000 mg | ORAL_CAPSULE | Freq: Two times a day (BID) | ORAL | Status: DC
  Administered 2020-08-07 – 2020-08-12 (×6): 100 mg via ORAL
  Filled 2020-08-07 (×12): qty 1

## 2020-08-07 MED ORDER — ONDANSETRON HCL 4 MG/2ML IJ SOLN
INTRAMUSCULAR | Status: AC
  Filled 2020-08-07: qty 2

## 2020-08-07 MED ORDER — BUPIVACAINE-EPINEPHRINE (PF) 0.5% -1:200000 IJ SOLN
INTRAMUSCULAR | Status: AC
  Filled 2020-08-07: qty 30

## 2020-08-07 MED ORDER — ACETAMINOPHEN 325 MG PO TABS: 325.0000 mg | ORAL_TABLET | Freq: Four times a day (QID) | ORAL | Status: DC | PRN

## 2020-08-07 MED ORDER — OXYCODONE HCL 5 MG/5ML PO SOLN: 5.0000 mg | Freq: Once | ORAL | Status: DC | PRN

## 2020-08-07 MED ORDER — ORAL CARE MOUTH RINSE: 15.0000 mL | Freq: Once | OROMUCOSAL | Status: AC

## 2020-08-07 MED ORDER — MAGNESIUM HYDROXIDE 400 MG/5ML PO SUSP
30.0000 mL | Freq: Every day | ORAL | Status: DC | PRN
  Administered 2020-08-08: 30 mL via ORAL
  Filled 2020-08-07: qty 30

## 2020-08-07 MED ORDER — BUPIVACAINE HCL (PF) 0.5 % IJ SOLN
INTRAMUSCULAR | Status: AC
  Filled 2020-08-07: qty 10

## 2020-08-07 MED ORDER — VENLAFAXINE HCL 37.5 MG PO TABS
37.5000 mg | ORAL_TABLET | Freq: Two times a day (BID) | ORAL | Status: DC
  Administered 2020-08-07 – 2020-08-13 (×12): 37.5 mg via ORAL
  Filled 2020-08-07 (×14): qty 1

## 2020-08-07 MED ORDER — ACETAMINOPHEN 500 MG PO TABS
500.0000 mg | ORAL_TABLET | Freq: Four times a day (QID) | ORAL | Status: AC
  Administered 2020-08-07 – 2020-08-08 (×4): 500 mg via ORAL
  Filled 2020-08-07 (×4): qty 1

## 2020-08-07 MED ORDER — KETOROLAC TROMETHAMINE 15 MG/ML IJ SOLN
INTRAMUSCULAR | Status: AC
  Administered 2020-08-07: 15 mg via INTRAVENOUS
  Filled 2020-08-07: qty 1

## 2020-08-07 MED ORDER — SODIUM CHLORIDE FLUSH 0.9 % IV SOLN
INTRAVENOUS | Status: AC
  Filled 2020-08-07: qty 40

## 2020-08-07 MED ORDER — PROPOFOL 500 MG/50ML IV EMUL
INTRAVENOUS | Status: DC | PRN
  Administered 2020-08-07: 65 ug/kg/min via INTRAVENOUS

## 2020-08-07 SURGICAL SUPPLY — 60 items
BEARING TIBIAL KNEE AS 14M 71M (Knees) ×2 IMPLANT
BLADE SAW SAG 25X90X1.19 (BLADE) ×2 IMPLANT
BLADE SURG SZ20 CARB STEEL (BLADE) ×2 IMPLANT
BNDG ELASTIC 6X5.8 VLCR NS LF (GAUZE/BANDAGES/DRESSINGS) ×2 IMPLANT
CANISTER SUCT 1200ML W/VALVE (MISCELLANEOUS) IMPLANT
CANISTER SUCT 3000ML PPV (MISCELLANEOUS) IMPLANT
CEMENT BONE R 1X40 (Cement) ×4 IMPLANT
CEMENT VACUUM MIXING SYSTEM (MISCELLANEOUS) ×2 IMPLANT
CHLORAPREP W/TINT 26 (MISCELLANEOUS) ×2 IMPLANT
COOLER POLAR GLACIER W/PUMP (MISCELLANEOUS) ×2 IMPLANT
COVER MAYO STAND REUSABLE (DRAPES) ×2 IMPLANT
COVER WAND RF STERILE (DRAPES) IMPLANT
CUFF TOURN SGL QUICK 24 (TOURNIQUET CUFF)
CUFF TOURN SGL QUICK 30 (TOURNIQUET CUFF) ×1
CUFF TRNQT CYL 24X4X16.5-23 (TOURNIQUET CUFF) IMPLANT
CUFF TRNQT CYL 30X4X21-28X (TOURNIQUET CUFF) ×1 IMPLANT
DRAPE 3/4 80X56 (DRAPES) ×2 IMPLANT
DRAPE IMP U-DRAPE 54X76 (DRAPES) ×2 IMPLANT
DRSG MEPILEX SACRM 8.7X9.8 (GAUZE/BANDAGES/DRESSINGS) IMPLANT
DRSG OPSITE POSTOP 4X10 (GAUZE/BANDAGES/DRESSINGS) ×2 IMPLANT
DRSG OPSITE POSTOP 4X8 (GAUZE/BANDAGES/DRESSINGS) IMPLANT
ELECT REM PT RETURN 9FT ADLT (ELECTROSURGICAL) ×2
ELECTRODE REM PT RTRN 9FT ADLT (ELECTROSURGICAL) ×1 IMPLANT
FEMORAL CR LEFT 65MM (Joint) ×2 IMPLANT
GAUZE XEROFORM 1X8 LF (GAUZE/BANDAGES/DRESSINGS) ×2 IMPLANT
GLOVE SRG 8 PF TXTR STRL LF DI (GLOVE) ×1 IMPLANT
GLOVE SURG ENC MOIS LTX SZ7.5 (GLOVE) ×8 IMPLANT
GLOVE SURG ENC MOIS LTX SZ8 (GLOVE) ×8 IMPLANT
GLOVE SURG UNDER LTX SZ8 (GLOVE) ×2 IMPLANT
GLOVE SURG UNDER POLY LF SZ8 (GLOVE) ×1
GOWN STRL REUS W/ TWL LRG LVL3 (GOWN DISPOSABLE) ×1 IMPLANT
GOWN STRL REUS W/ TWL XL LVL3 (GOWN DISPOSABLE) ×1 IMPLANT
GOWN STRL REUS W/TWL LRG LVL3 (GOWN DISPOSABLE) ×1
GOWN STRL REUS W/TWL XL LVL3 (GOWN DISPOSABLE) ×1
HOLDER FOLEY CATH W/STRAP (MISCELLANEOUS) ×2 IMPLANT
HOOD PEEL AWAY FLYTE STAYCOOL (MISCELLANEOUS) ×8 IMPLANT
IV NS IRRIG 3000ML ARTHROMATIC (IV SOLUTION) ×2 IMPLANT
KIT TURNOVER KIT A (KITS) ×2 IMPLANT
MANIFOLD NEPTUNE II (INSTRUMENTS) ×2 IMPLANT
NEEDLE SPNL 20GX3.5 QUINCKE YW (NEEDLE) ×2 IMPLANT
NS IRRIG 1000ML POUR BTL (IV SOLUTION) ×2 IMPLANT
PACK TOTAL KNEE (MISCELLANEOUS) ×2 IMPLANT
PAD WRAPON POLAR KNEE (MISCELLANEOUS) ×1 IMPLANT
PATELLA SERIES A (Orthopedic Implant) ×2 IMPLANT
PENCIL SMOKE EVACUATOR (MISCELLANEOUS) ×2 IMPLANT
PLATE KNEE TIBIAL 71MM FIXED (Plate) ×2 IMPLANT
PULSAVAC PLUS IRRIG FAN TIP (DISPOSABLE) ×2
STAPLER SKIN PROX 35W (STAPLE) ×2 IMPLANT
SUCTION FRAZIER HANDLE 10FR (MISCELLANEOUS) ×1
SUCTION TUBE FRAZIER 10FR DISP (MISCELLANEOUS) ×1 IMPLANT
SUT VIC AB 0 CT1 36 (SUTURE) ×6 IMPLANT
SUT VIC AB 2-0 CT1 (SUTURE) ×2 IMPLANT
SUT VIC AB 2-0 CT1 27 (SUTURE) ×3
SUT VIC AB 2-0 CT1 TAPERPNT 27 (SUTURE) ×3 IMPLANT
SYR 10ML LL (SYRINGE) ×2 IMPLANT
SYR 20ML LL LF (SYRINGE) ×2 IMPLANT
SYR 30ML LL (SYRINGE) IMPLANT
TIP FAN IRRIG PULSAVAC PLUS (DISPOSABLE) ×1 IMPLANT
TRAY FOLEY MTR SLVR 16FR STAT (SET/KITS/TRAYS/PACK) ×2 IMPLANT
WRAPON POLAR PAD KNEE (MISCELLANEOUS) ×2

## 2020-08-07 NOTE — Discharge Summary (Signed)
Physician Discharge Summary  Patient ID: Stacey [name] MRN: [id number] DOB/AGE: Jul 14, 1950 70 y.o.  Admit date: 08/07/2020 Discharge date:  08/13/20  Admission Diagnoses:  Status post total knee replacement using cement, left [Z96.652]   Discharge Diagnoses: Patient Active Problem List   Diagnosis Date Noted  . Atrial fibrillation with RVR (HCC)   . NICM (nonischemic cardiomyopathy) (HCC)   . Status post total knee replacement using cement, left 08/07/2020  . Acute CHF (congestive heart failure) (HCC) 10/11/2019  . Bacteremia due to Gram-negative bacteria 10/11/2019  . HLD (hyperlipidemia)   . Acute on chronic diastolic CHF (congestive heart failure) (HCC)   . Acute respiratory failure with hypoxia (HCC)   . Abnormal LFTs   . Hypokalemia   . Hypomagnesemia   . Elevated lactic acid level   . Elevated LFTs   . Hypothyroidism 10/01/2019  . Normocytic anemia 10/01/2019  . CKD (chronic kidney disease), stage IIIa 10/01/2019  . Closed fracture of right distal fibula 10/01/2019  . Morbid obesity with BMI of 40.0-44.9, adult (HCC) 10/01/2019  . Preop cardiovascular exam   . 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 Persistent atrial fibrillation (HCC) 09/26/2017  . Essential hypertension 09/16/2014  . Sleep apnea 12/20/2013  . Coronary artery disease   . Chronic diastolic CHF (congestive heart failure) (HCC)   . Obesity   . GERD (gastroesophageal reflux disease)   . Hypertensive cardiovascular disease   . Depression   . Presumed Obstructive Sleep Apnea   . Atrial fibrillation [facility])     Past Medical History:  Diagnosis Date  . Anxiety   . Aortic atherosclerosis (HCC)   . Arthritis    "knees" (09/26/2017)  . Bacteremia    a.09/2019 E. coli bacteremia - unclear source.  . 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 Dx 08/2013 in setting of rapid afib;  b. 08/2013 Echo: EF 55-60%, mildly dil LA, nl RV; c. 08/2017 Echo: EF 20-25% (in afib); d.  11/2017 Echo: EF 50-55%; e. 09/2019 Echo: EF 55-60%, no rwma.  . CKD (chronic kidney disease), stage III (HCC)   . 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 GERD (gastroesophageal reflux disease)   . History of blood transfusion 1975   "related to ectopic pregnancy  . HLD (hyperlipidemia)   . Hypertension   . Hypothyroidism   . Non-obstructive CAD    a. 10/2013 Myoview: anteroseptal, apical, septal mild ischemia, nl LV fxn;  b. Cath: LAD 47m LCX 20ost, RCA 68m.  . Obesity   . OSA on CPAP   . Paroxysmal atrial fibrillation (HCC)    a. Dx 08/2013. CHA2DS2VASc = 4 (Eliquis); b. Prev on flecainide w/ multiple DCCVs; c. 09/2017 Tikosyn added-->TdP 2/2 prolonged QT->amio; d. 11/2018 s/p DCCV-amio cont; e. 01/2020 recurrent Afib on low-dose amio-->DCCV (200J x 3), Amio cont @ 200 Daily.  . Pneumonia    "1-2 times" (09/26/2017)  . Systolic ejection murmur   . Tachycardia induced cardiomyopathy (HCC)    a. 08/2017 Echo: EF 20-25%, diff HK in setting of Afib w/ RVR; b. 11/2017 Echo: EF 50-55%; c. 09/2019 Echo: EF 55-60%.     Transfusion: None.   Consultants (if any): Treatment Team:  Marinus Maw, MD  Discharged Condition: Improved  Hospital Course: Stacey [name] is an 70 y.o. female who was admitted 08/07/2020 with a diagnosis of primary osteoarthritis of the left knee and went to the operating room on 08/07/2020 and underwent the above named procedures.  Shortly after her surgery the patient began to experience tachycardia and her HR reverted back to A. Fib.  Cardiology was consult and her medications adjusted. Coreg increased to 25mg  BID and Entresto 24/26 mg BID started as well.  Patient's HR closer to 100, cleared for discharge to SNF on 08/13/20.  She will follow-up as an outpatient with cardiology.     Surgeries: Procedure(s): TOTAL KNEE ARTHROPLASTY on 08/07/2020 Patient tolerated the surgery well. Taken to PACU where she was stabilized and then transferred to the orthopedic  floor.  Started on Eliquis 5mg  every 12 hours. Foot pumps applied bilaterally at 80 mm. Heels elevated on bed with rolled towels. No evidence of DVT. Negative Homan. Physical therapy started on day #1 for gait training and transfer. OT started day #1 for ADL and assisted devices.  Patient's foley was removed on POD1.  IV was removed on 08/13/20  Implants: Left TKA using all-cemented [employer] Vanguard system with a 65 mm PCR femur, a 71 mm tibial tray with a 14 mm anterior stabilized E-poly insert, and a 31 x 8 mm all-poly 3-pegged domed patella.  She was given perioperative antibiotics:  Anti-infectives (From admission, onward)   Start     Dose/Rate Route Frequency Ordered Stop   08/07/20 1400  ceFAZolin (ANCEF) IVPB 2g/100 mL premix        2 g 200 mL/hr over 30 Minutes Intravenous Every 6 hours 08/07/20 1109 08/08/20 0238   08/07/20 0720  ceFAZolin (ANCEF) 2-4 GM/100ML-% IVPB       Note to Pharmacy: 08/10/20  : cabinet override      08/07/20 0720 08/07/20 0745   08/07/20 0615  ceFAZolin (ANCEF) IVPB 2g/100 mL premix        2 g 200 mL/hr over 30 Minutes Intravenous On call to O.R. 08/07/20 08/09/20 08/07/20 0747    .  She was given sequential compression devices, early ambulation, and Eliquis for DVT prophylaxis.  She benefited maximally from the hospital stay and there were no complications.    Recent vital signs:  Vitals:   08/13/20 0500 08/13/20 0827  BP: 136/82 (!) 149/92  Pulse: (!) 103 (!) 116  Resp: 18 18  Temp: 98.4 F (36.9 C) 97.7 F (36.5 C)  SpO2: 96% 96%    Recent laboratory studies:  Lab Results  Component Value Date   HGB 9.7 (L) 08/10/2020   HGB 9.7 (L) 08/09/2020   HGB 9.7 (L) 08/08/2020   Lab Results  Component Value Date   WBC 10.1 08/10/2020   PLT 214 08/10/2020   Lab Results  Component Value Date   INR 1.5 (H) 10/11/2019   Lab Results  Component Value Date   NA 137 08/10/2020   K 3.9 08/10/2020   CL 100 08/10/2020   CO2 25  08/10/2020   BUN 25 (H) 08/10/2020   CREATININE 1.19 (H) 08/10/2020   GLUCOSE 110 (H) 08/10/2020    Discharge Medications:   Allergies as of 08/13/2020      Reactions   Levaquin [levofloxacin In D5w] Other (See Comments)   Severe dizziness   Metoprolol Itching, Nausea And Vomiting, Rash, Other (See Comments)   Dizziness   Tikosyn [dofetilide] Other (See Comments)   Developed Torsades on Tikosyn 09/28/17   Ace Inhibitors Cough   Adhesive [tape] Rash      Medication List    STOP taking these medications   Entresto 49-51 MG Generic drug: sacubitril-valsartan Replaced by: sacubitril-valsartan 24-26 MG     TAKE these  medications   amiodarone 200 MG tablet Commonly known as: PACERONE Take 200 mg by mouth daily.   carvedilol 25 MG tablet Commonly known as: COREG Take 1 tablet (25 mg total) by mouth 2 (two) times daily. What changed:   medication strength  how much to take   diclofenac sodium 1 % Gel Commonly known as: VOLTAREN Apply 1 application topically 4 (four) times daily as needed (knee pain).   Eliquis 5 MG Tabs tablet Generic drug: apixaban TAKE 1 TABLET(5 MG) BY MOUTH TWICE DAILY   esomeprazole 40 MG capsule Commonly known as: NEXIUM TAKE 1 CAPSULE BY MOUTH EVERY MORNING   furosemide 20 MG tablet Commonly known as: LASIX Take 1 tablet (20 mg total) by mouth daily.   HYDROcodone-acetaminophen 10-325 MG tablet Commonly known as: NORCO Take 1 tablet by mouth every 4 (four) hours as needed for moderate pain. What changed:   when to take this  reasons to take this   levothyroxine 100 MCG tablet Commonly known as: SYNTHROID Take 100 mcg by mouth daily before breakfast.   magnesium oxide 400 MG tablet Commonly known as: MAG-OX Take 400 mg by mouth daily.   ondansetron 4 MG tablet Commonly known as: ZOFRAN Take 1 tablet (4 mg total) by mouth every 6 (six) hours as needed for nausea. What changed:   when to take this  reasons to take this    rosuvastatin 10 MG tablet Commonly known as: CRESTOR Take 10 mg by mouth at bedtime.   sacubitril-valsartan 24-26 MG Commonly known as: ENTRESTO Take 1 tablet by mouth 2 (two) times daily. Replaces: Entresto 49-51 MG   venlafaxine 37.5 MG tablet Commonly known as: EFFEXOR Take 37.5 mg by mouth 2 (two) times daily.   zolpidem 10 MG tablet Commonly known as: AMBIEN Take 10 mg by mouth at bedtime.       Diagnostic Studies: DG Knee Left Port  Result Date: 08/07/2020 CLINICAL DATA:  Post total knee replacement LEFT EXAM: PORTABLE LEFT KNEE - 1-2 VIEW COMPARISON:  Portable exam 1017 hours without priors for comparison FINDINGS: Mild osseous demineralization. Components of a LEFT knee prosthesis are identified in expected position. No acute fracture, dislocation, or bone destruction. Anterior skin clips and postsurgical soft tissue changes. IMPRESSION: LEFT knee prosthesis without acute abnormalities. Electronically Signed   By: Ulyses Southward M.D.   On: 08/07/2020 11:25   Disposition: Plan is for discharge to rehab today.   Contact information for follow-up providers    Anson Oregon, PA-C Follow up in 14 day(s).   Specialty: Physician Assistant Why: Stacey [name] information: [address] [place] [address] [phone]        Anson Oregon, PA-C On 08/22/2020.   Specialty: Physician Assistant Why: AT 1015 Contact information: [address] [place] [address] [phone]            Contact information for after-discharge care    Destination    [facility]erred [facility] .   Service: Skilled Nursing Contact information: [address] [place] [address] [phone]                 Signed: Meriel Pica PA-C 08/13/2020, 10:27 AM

## 2020-08-07 NOTE — H&P (Signed)
History of Present Illness:  Stacey [name] is a 70 y.o. female who presents for follow-up of her bilateral knee pain secondary to degenerative joint disease, left more symptomatic than right. The patient was last seen for the symptoms 5 months ago. At that time, she felt that her symptoms are sufficiently manageable that she did not wish to pursue further intervention. She was encouraged to work on weight loss and to continue with her home exercise regimen. The patient notes that over the past few months, her bilateral knee symptoms have progressively worsened. She localizes the pain to the medial aspects of both knees. Her symptoms are aggravated by any prolonged standing or ambulation. She denies any pain at rest, but notes that, with ambulation, her symptoms can become quite symptomatic. She has been ambulating with a cane when out of the house, and with a walker went inside the home. She is unable to reciprocate stairs. She does recall having a fall in January and wonders if this may have caused some flare-up of her symptoms. She denies any numbness or paresthesias down her legs to her feet.  Current Outpatient Medications: . amiodarone (PACERONE) 100 MG tablet Take 200 mg by mouth 2 (two) times daily  . carvediloL (COREG) 3.125 MG tablet 3.125 mg 2 (two) times daily with meals  . diclofenac (VOLTAREN) 1 % topical gel Apply topically 3 (three) times daily 100 g 5  . ELIQUIS 5 mg tablet 5 mg every 12 (twelve) hours  . ENTRESTO 49-51 mg tablet TK 1 T PO BID  . FUROsemide (LASIX) 40 MG tablet TAKE 1 TABLET(40 MG) BY MOUTH EVERY DAY 90 tablet 0  . HYDROcodone-acetaminophen (NORCO) 10-325 mg tablet Take 1 tablet by mouth every 6 (six) hours as needed for Pain for up to 30 days 150 tablet 0  . levothyroxine (SYNTHROID) 100 MCG tablet Take 1 tablet (100 mcg total) by mouth once daily Take on an empty stomach with a glass of water at least 30-60 minutes before breakfast. 30 tablet 11  . rosuvastatin  (CRESTOR) 10 MG tablet Take 1 tablet (10 mg total) by mouth once daily 90 tablet 1  . venlafaxine (EFFEXOR) 37.5 MG tablet Take 1 tablet (37.5 mg total) by mouth 2 (two) times daily 60 tablet 11  . zolpidem (AMBIEN) 10 mg tablet TAKE 1 TABLET(10 MG) BY MOUTH EVERY NIGHT AS NEEDED 30 tablet 5   Allergies:  . Dofetilide Other (Developed Torsades on Tikosyn 09/28/17)  . Levofloxacin Other (Severe dizziness)  . Ace Inhibitors Other (Cough)  . Metoprolol Dizziness   Past Medical History:  . A-fib (CMS-HCC)  . Acquired hypothyroidism, unspecified 03/30/2018  . Anxiety  . Arthritis  . CHF (congestive heart failure) (CMS-HCC)  . Chicken pox  . Encounter for blood transfusion  . GERD (gastroesophageal reflux disease)  . Hypertension  . Pure hypercholesterolemia  . Sleep apnea   Past Surgical History:  . FRACTURE SURGERY  . Open reduction and internal fixation of displaced distal fibular fracture, right ankle Right 10/01/2019 (Dr. Joice Lofts)  . TONSILLECTOMY   Family History  Problem Relation Age of Onset  . High blood pressure (Hypertension) Mother  . Allergic rhinitis Mother  . Breast cancer Mother  . Hyperlipidemia (Elevated cholesterol) Mother  . Osteoarthritis Mother  . Osteoporosis (Thinning of bones) Mother  . Stroke Father  . Coronary Artery Disease (Blocked arteries around heart) Father  . Deep vein thrombosis (DVT or abnormal blood clot formation) Father  . Depression Father  . Diabetes type  II Father  . Obesity Father  . No Known Problems Sister   Social History:   Socioeconomic History:  Marland Kitchen Marital status: Married  Spouse name: Not on file  . Number of children: Not on file  . Years of education: Not on file  . Highest education level: Not on file  Occupational History  . Not on file  Tobacco Use  . Smoking status: Never Smoker  . Smokeless tobacco: Never Used  Vaping Use  . Vaping Use: Never used  Substance and Sexual Activity  . Alcohol use: Yes  . Drug use:  No  . Sexual activity: Yes  Partners: Male  Other Topics Concern  . Not on file  Social History Narrative  . Not on file   Social Determinants of Health:   Financial Resource Strain: Not on file  Food Insecurity: Not on file  Transportation Needs: Not on file   Review of Systems:  A comprehensive 14 point ROS was performed, reviewed, and the pertinent orthopaedic findings are documented in the HPI.  Physical Exam: Vitals:  06/23/20 1151  BP: 132/78  Weight: (!) 113.9 kg (251 lb)  Height: 160 cm (5\' 3" )  PainSc: 0-No pain  PainLoc: Knee   General/Constitutional: Pleasant significantly overweight middle-aged female in no acute distress. Neuro/Psych: Normal mood and affect, oriented to person, place and time. Eyes: Non-icteric. Pupils are equal, round, and reactive to light, and exhibit synchronous movement. ENT: Unremarkable. Lymphatic: No palpable adenopathy. Respiratory: Lungs clear to auscultation, Normal chest excursion, No wheezes and Non-labored breathing Cardiovascular: Irregularly irregular rhythm with no murmurs and No edema, swelling or tenderness, except as noted in detailed exam. Integumentary: No impressive skin lesions present, except as noted in detailed exam. Musculoskeletal: Unremarkable, except as noted in detailed exam.  Left knee exam: Skin inspection around theleft knee remains unremarkable. No swelling, erythema, ecchymosis, abrasions or other skin abnormalities are identified. She exhibits a trace effusion. She demonstrates mild-moderate tenderness to palpation along the medial joint line, but notes only minimal tenderness laterally. She has no peripatellar tenderness. Actively, she is able to extend her knee to 0 degrees and flex to 100degrees. Her patella tracks well and is without crepitus. She exhibits mild pseudolaxity to varus stressing. She remains neurovascularly intact to the left lower extremity and foot.  X-rays knee: Standing AP and  lateral x-rays of the left knee, as well as a sunrise view, are obtained. These films demonstrate severe degenerative joint disease, primarily involving the medial compartment, with complete loss of the medial compartment clear space. Lesser degenerative changes of the lateral and patellofemoral compartments also are noted. No lytic lesions or fractures are identified.  Assessment: 1. Morbid obesity with BMI of 40.0-44.9. 2. Primary osteoarthritis of left knee.   Plan: The treatment options were discussed with the patient and her husband. In addition, patient educational materials were provided regarding the diagnosis and treatment options. The patient is quite frustrated by her knee symptoms and is ready to consider more aggressive treatment options. At this time, she feels that her left knee is more symptomatic than her right. Therefore, I have recommended a surgical procedure, specifically a left total knee arthroplasty. The procedure was discussed with the patient, as were the potential risks (including bleeding, infection, nerve and/or blood vessel injury, persistent or recurrent pain, loosening and/or failure of the components, dislocation, need for further surgery, blood clots, strokes, heart attacks and/or arhythmias, pneumonia, etc.) and benefits. The patient states her understanding and wishes to proceed. All of  the patient's questions and concerns were answered. She can call any time with further concerns. She will follow up post-surgery, routine.   H&P reviewed and patient re-examined. No changes.

## 2020-08-07 NOTE — Discharge Instructions (Signed)

## 2020-08-07 NOTE — OR Nursing (Signed)
Patient arrived to [facility] with rapid heart rate in the 140's. EKG  Performed. Patient was asymptomatic with stable BP. Dr. Shela Commons. Piscitello into see patient and has reviewed EKG, rhythm strip and assessment of patient. He feels it is okay to proceed with surgery today. Heart rate came down to 80-110 prior to surgery.

## 2020-08-07 NOTE — Evaluation (Signed)
Physical Therapy Evaluation Patient Details Name: Stacey [name] MRN: [id number] DOB: [birth year] Today's Date: 08/07/2020   History of Present Illness  Patient s/p L TKA, WBAT. PMH of HTN, HLD, GERD, hypothyroidism, anxiety, PAF, morbid obesity, diastolic HF  Clinical Impression  Patient was alert, RN and spouse at bedside. Pt reported 8/10 throughout session that did increase with exercises and mobility, RN notified of pt request for pain medication. CPM moved in preparation for mobility. Pt reported at baseline she is modI/I, uses a quad cane and lives in a two story home with her husband. They reported that he will be unable to provide any physical assist at discharge, and the full bathroom is upstairs.  The patient was able to perform supine exercises with verbal and tactile cues, exhibited pain signs/symptoms with heel slides but no physical assist needed. Supine <> sit with minA, extended time, and use of bed rails. Fair sitting balance noted. Further mobility deferred due to pt feeling moderately light headed without improve of symptoms and unable to obtain clear BP reading. Pt returned to supine and all needs in reach.  Overall the patient demonstrated deficits (see "PT Problem List") that impede the patient's functional abilities, safety, and mobility and would benefit from skilled PT intervention. Recommendation at this time is SNF due to current level of assistance needed, decreased caregiver support, and inaccessible home environment.    Follow Up Recommendations SNF    Equipment Recommendations  None recommended by PT    Recommendations for Other Services       Precautions / Restrictions Precautions Precautions: Fall;Knee Restrictions Weight Bearing Restrictions: Yes LLE Weight Bearing: Weight bearing as tolerated      Mobility  Bed Mobility Overal bed mobility: Needs Assistance Bed Mobility: Supine to Sit;Sit to Supine     Supine to sit: Min assist;HOB elevated Sit  to supine: Min assist;HOB elevated   General bed mobility comments: minA for LLE assist    Transfers                 General transfer comment: declined pt moderately light headed throughout, unable to obtain accurate BP reading  Ambulation/Gait             General Gait Details: deferred  Stairs            Wheelchair Mobility    Modified Rankin (Stroke Patients Only)       Balance Overall balance assessment: Needs assistance Sitting-balance support: Feet supported;Single extremity supported Sitting balance-Leahy Scale: Fair                                       Pertinent Vitals/Pain Pain Assessment: 0-10 Pain Score: 8  Pain Descriptors / Indicators: Aching;Sharp;Sore Pain Intervention(s): Limited activity within patient's tolerance;Monitored during session;Premedicated before session;Repositioned;Patient requesting pain meds-RN notified;Ice applied    Home Living Family/patient expects to be discharged to:: Private residence Living Arrangements: Spouse/significant other Available Help at Discharge: Family Type of Home: House Home Access: Stairs to enter Entrance Stairs-Rails: Lawyer of Steps: 6 Home Layout: 1/2 bath on main level;Bed/bath upstairs;Two level Home Equipment: Walker - 2 wheels;Cane - quad Additional Comments: pt with falls at least once a week due to her knee buckling    Prior Function Level of Independence: Independent with assistive device(s)               Hand Dominance  Extremity/Trunk Assessment   Upper Extremity Assessment Upper Extremity Assessment: Overall WFL for tasks assessed    Lower Extremity Assessment Lower Extremity Assessment: RLE deficits/detail;LLE deficits/detail RLE Deficits / Details: WFLs LLE Deficits / Details: s/p L TKA       Communication   Communication: No difficulties  Cognition Arousal/Alertness: Awake/alert Behavior During Therapy:  WFL for tasks assessed/performed Overall Cognitive Status: Within Functional Limits for tasks assessed                                        General Comments      Exercises Total Joint Exercises Ankle Circles/Pumps: AROM;Both;10 reps Quad Sets: AROM;Strengthening;Left;10 reps Heel Slides: AROM;Strengthening;Left;10 reps   Assessment/Plan    PT Assessment Patient needs continued PT services  PT Problem List Decreased strength;Decreased range of motion;Decreased activity tolerance;Decreased balance;Decreased mobility;Pain;Decreased safety awareness;Decreased knowledge of use of DME       PT Treatment Interventions DME instruction;Balance training;Gait training;Neuromuscular re-education;Stair training;Functional mobility training;Patient/family education;Therapeutic activities;Therapeutic exercise    PT Goals (Current goals can be found in the Care Plan section)  Acute Rehab PT Goals Patient Stated Goal: to improve function; decrease falls PT Goal Formulation: With patient Time For Goal Achievement: 08/21/20 Potential to Achieve Goals: Good    Frequency BID   Barriers to discharge Decreased caregiver support;Inaccessible home environment spouse unable to provide any physical assist, no bed/full bath on first floor    Co-evaluation               AM-PAC PT "6 Clicks" Mobility  Outcome Measure Help needed turning from your back to your side while in a flat bed without using bedrails?: A Little Help needed moving from lying on your back to sitting on the side of a flat bed without using bedrails?: A Little Help needed moving to and from a bed to a chair (including a wheelchair)?: A Little Help needed standing up from a chair using your arms (e.g., wheelchair or bedside chair)?: A Lot Help needed to walk in hospital room?: A Lot Help needed climbing 3-5 steps with a railing? : Total 6 Click Score: 14    End of Session   Activity Tolerance: Treatment  limited secondary to medical complications (Comment);Other (comment);Patient limited by pain (limited by light headedness) Patient left: in bed;with family/visitor present;with call bell/phone within reach;with bed alarm set Nurse Communication: Mobility status PT Visit Diagnosis: Other abnormalities of gait and mobility (R26.89);Difficulty in walking, not elsewhere classified (R26.2);Pain;Muscle weakness (generalized) (M62.81) Pain - Right/Left: Left Pain - part of body: Knee    Time: 5625-6389 PT Time Calculation (min) (ACUTE ONLY): 30 min   Charges:   PT Evaluation $PT Eval Low Complexity: 1 Low PT Treatments $Therapeutic Exercise: 8-22 mins $Therapeutic Activity: 8-22 mins        Olga Coaster PT, DPT 4:23 PM,08/07/20

## 2020-08-07 NOTE — Transfer of Care (Signed)
Immediate Anesthesia Transfer of Care Note  Patient: Stacey [name]  Procedure(s) Performed: TOTAL KNEE ARTHROPLASTY (Left Knee)  Patient Location: PACU  Anesthesia Type:Spinal  Level of Consciousness: awake, alert  and oriented  Airway & Oxygen Therapy: Patient Spontanous Breathing  Post-op Assessment: Report given to RN and Post -op Vital signs reviewed and stable  Post vital signs: Reviewed and stable  Last Vitals:  Vitals Value Taken Time  BP 117/79 08/07/20 1002  Temp    Pulse 99 08/07/20 1003  Resp 14 08/07/20 1003  SpO2 94 % 08/07/20 1003  Vitals shown include unvalidated device data.  Last Pain:  Vitals:   08/07/20 0623  TempSrc: Tympanic  PainSc: 7          Complications: No complications documented.

## 2020-08-07 NOTE — Progress Notes (Signed)
Met with the patient at the bedside, she lives at home with her spouse  He is not able to help her at home She does have a rolling walker at home and does not feel that she needs additional DME She plans to go to SNF at [place], I explained it would depend upon how she did with PT, She requested that I do a bedsearch anyway and she wants to go to [facility] as she has been there before,  PASSR number obtained [id number], Bedsearch sent, She has had all covid Vaccines and had a booster in March 2022

## 2020-08-07 NOTE — Anesthesia Preprocedure Evaluation (Signed)
Anesthesia Evaluation  Patient identified by MRN, date of birth, ID band Patient awake    Reviewed: Allergy & Precautions, H&P , NPO status , Patient's Chart, lab work & pertinent test results  History of Anesthesia Complications Negative for: history of anesthetic complications  Airway Mallampati: III  TM Distance: <3 FB Neck ROM: limited    Dental  (+) Chipped   Pulmonary neg shortness of breath, sleep apnea and Continuous Positive Airway Pressure Ventilation ,    Pulmonary exam normal        Cardiovascular Exercise Tolerance: Good hypertension, + CAD and +CHF  + Valvular Problems/Murmurs  Rhythm:irregular     Neuro/Psych PSYCHIATRIC DISORDERS negative neurological ROS     GI/Hepatic Neg liver ROS, GERD  Medicated and Controlled,  Endo/Other  Hypothyroidism   Renal/GU Renal disease     Musculoskeletal  (+) Arthritis ,   Abdominal   Peds  Hematology negative hematology ROS (+)   Anesthesia Other Findings Past Medical History: No date: Anxiety No date: Aortic atherosclerosis (HCC) No date: Arthritis     Comment:  "knees" (09/26/2017) No date: Bacteremia     Comment:  a.09/2019 E. coli bacteremia - unclear source. 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 Dx 08/2013 in setting of rapid afib;  b. 08/2013 Echo:               EF 55-60%, mildly dil LA, nl RV; c. 08/2017 Echo: EF               20-25% (in afib); d. 11/2017 Echo: EF 50-55%; e. 09/2019               Echo: EF 55-60%, no rwma. No date: CKD (chronic kidney disease), stage III (HCC) 10/06/2017: Drug-induced torsades de pointes (HCC) No date: GERD (gastroesophageal reflux disease) 1975: History of blood transfusion     Comment:  "related to ectopic pregnancy No date: HLD (hyperlipidemia) No date: Hypertension No date: Hypothyroidism No date: Non-obstructive CAD     Comment:  a. 10/2013 Myoview: anteroseptal, apical,  septal mild               ischemia, nl LV fxn;  b. Cath: LAD 43m LCX 20ost, RCA               71m. No date: Obesity No date: OSA on CPAP No date: Paroxysmal atrial fibrillation (HCC)     Comment:  a. Dx 08/2013. CHA2DS2VASc = 4 (Eliquis); b. Prev on               flecainide w/ multiple DCCVs; c. 09/2017 Tikosyn               added-->TdP 2/2 prolonged QT->amio; d. 11/2018 s/p               DCCV-amio cont; e. 01/2020 recurrent Afib on low-dose               amio-->DCCV (200J x 3), Amio cont @ 200 Daily. No date: Pneumonia     Comment:  "1-2 times" (09/26/2017) No date: Systolic ejection murmur No date: Tachycardia induced cardiomyopathy (HCC)     Comment:  a. 08/2017 Echo: EF 20-25%, diff HK in setting of Afib w/              RVR; b. 11/2017 Echo: EF 50-55%; c. 09/2019 Echo: EF  55-60%.  Past Surgical History: 2007: BREAST BIOPSY; Right     Comment:  stereo-neg 10/2013: CARDIAC CATHETERIZATION     Comment:  [facility] 08/29/2017: CARDIOVERSION; N/A     Comment:  Procedure: CARDIOVERSION;  Surgeon: Iran Ouch,               MD;  Location: [facility] ORS;  Service: Cardiovascular;                Laterality: N/A; No date: CARDIOVERSION     Comment:  "I've had a total of 4" (09/26/2017) 12/08/2018: CARDIOVERSION; N/A     Comment:  Procedure: CARDIOVERSION;  Surgeon: Antonieta Iba,               MD;  Location: [facility] ORS;  Service: Cardiovascular;                Laterality: N/A; 02/18/2020: CARDIOVERSION; N/A     Comment:  Procedure: CARDIOVERSION;  Surgeon: Iran Ouch,               MD;  Location: [facility] ORS;  Service: Cardiovascular;                Laterality: N/A; 1975: ECTOPIC PREGNANCY SURGERY 10/01/2019: ORIF ANKLE FRACTURE; Right     Comment:  Procedure: OPEN REDUCTION INTERNAL FIXATION (ORIF) ANKLE              FRACTURE;  Surgeon: Christena Flake, MD;  Location: [facility]               ORS;  Service: Orthopedics;  Laterality: Right; No date: TONSILLECTOMY  BMI    Body Mass  Index: 42.51 kg/m      Reproductive/Obstetrics negative OB ROS                             Anesthesia Physical Anesthesia Plan  ASA: III  Anesthesia Plan: Spinal   Post-op Pain Management:    Induction:   PONV Risk Score and Plan:   Airway Management Planned: Natural Airway and Nasal Cannula  Additional Equipment:   Intra-op Plan:   Post-operative Plan:   Informed Consent: I have reviewed the patients History and Physical, chart, labs and discussed the procedure including the risks, benefits and alternatives for the proposed anesthesia with the patient or authorized representative who has indicated his/her understanding and acceptance.     Dental Advisory Given  Plan Discussed with: Anesthesiologist, CRNA and Surgeon  Anesthesia Plan Comments: (Patient reports no bleeding problems and no anticoagulant use (> 72 hrs for Eliquis).  Plan for spinal with backup GA  Patient consented for risks of anesthesia including but not limited to:  - adverse reactions to medications - damage to eyes, teeth, lips or other oral mucosa - nerve damage due to positioning  - risk of bleeding, infection and or nerve damage from spinal that could lead to paralysis - risk of headache or failed spinal - damage to teeth, lips or other oral mucosa - sore throat or hoarseness - damage to heart, brain, nerves, lungs, other parts of body or loss of life  Patient voiced understanding.)        Anesthesia Quick Evaluation

## 2020-08-07 NOTE — Op Note (Signed)
08/07/2020  10:19 AM  Patient:   Stacey [name]  Pre-Op Diagnosis:   Degenerative joint disease, left knee.  Post-Op Diagnosis:   Same  Procedure:   Left TKA using all-cemented Bi[employer] Vanguard system with a 65 mm PCR femur, a 71 mm tibial tray with a 14 mm anterior stabilized E-poly insert, and a 31 x 8 mm all-poly 3-pegged domed patella.  Surgeon:   Maryagnes Amos, MD  Assistant:   Horris Latino, PA-C; Brigid Re, PA-S  Anesthesia:   Spinal  Findings:   As above  Complications:   None  EBL:   10 cc  Fluids:   1400 cc crystalloid  UOP:   75 cc  TT:   90 minutes at 300 mmHg  Drains:   None  Closure:   Staples  Implants:   As above  Brief Clinical Note:   The patient is a 70 year old female with a long history of progressively worsening left knee pain. The patient's symptoms have progressed despite medications, activity modification, injections, etc. The patient's history and examination were consistent with advanced degenerative joint disease of the left knee confirmed by plain radiographs. The patient presents at this time for a left total knee arthroplasty.  Procedure:   The patient was brought into the operating room. After adequate spinal anesthesia was obtained, the patient was lain in the supine position before the left lower extremity was prepped with ChloraPrep solution and draped sterilely. Preoperative antibiotics were administered. After verifying the proper laterality with a surgical timeout, the limb was exsanguinated with an Esmarch and the tourniquet inflated to 300 mmHg.   A standard anterior approach to the knee was made through an approximately 7 inch incision. The incision was carried down through the subcutaneous tissues to expose superficial retinaculum. This was split the length of the incision and the medial flap elevated sufficiently to expose the medial retinaculum. The medial retinaculum was incised, leaving a 3-4 mm cuff of tissue on the  patella. This was extended distally along the medial border of the patellar tendon and proximally through the medial third of the quadriceps tendon. A subtotal fat pad excision was performed before the soft tissues were elevated off the anteromedial and anterolateral aspects of the proximal tibia to the level of the collateral ligaments. The anterior portions of the medial and lateral menisci were removed, as was the anterior cruciate ligament. With the knee flexed to 90, the external tibial guide was positioned and the appropriate proximal tibial cut made. This piece was taken to the back table where it was measured and found to be optimally replicated by a 71 mm component.  Attention was directed to the distal femur. The intramedullary canal was accessed through a 3/8" drill hole. The intramedullary guide was inserted and positioned in order to obtain a neutral flexion gap. The intercondylar block was positioned with care taken to avoid notching the anterior cortex of the femur. The appropriate cut was made. Next, the distal cutting block was placed at 5 of valgus alignment. Using the 9 mm slot, the distal cut was made. The distal femur was measured and found to be optimally replicated by the 65 mm component. The 65 mm 4-in-1 cutting block was positioned and first the posterior, then the posterior chamfer, the anterior chamfer, and finally the anterior cuts were made. At this point, the posterior portions medial and lateral menisci were removed. A trial reduction was performed using the appropriate femoral and tibial components with first the 10 mm,  then the 12 mm, and finally the 14 mm insert. The 14 mm insert demonstrated excellent stability to varus and valgus stressing both in flexion and extension while permitting full extension. Patella tracking was assessed and found to be excellent. Therefore, the tibial guide position was marked on the proximal tibia. The patella thickness was measured and found to be  21 mm. Therefore, the appropriate cut was made. The patellar surface was measured and found to be optimally replicated by the 31 mm component. The three peg holes were drilled in place before the trial button was inserted. Patella tracking was assessed and found to be excellent, passing the "no thumb test". The lug holes were drilled into the distal femur before the trial component was removed, leaving only the tibial tray. The keel was then created using the appropriate tower, reamer, and punch.  The bony surfaces were prepared for cementing by irrigating them thoroughly with sterile saline solution via the jet lavage system. A bone plug was fashioned from some of the bone that had been removed previously and used to plug the distal femoral canal. In addition, 20 cc of Exparel diluted out to 60 cc with normal saline and 30 cc of 0.5% Sensorcaine were injected into the postero-medial and postero-lateral aspects of the knee, the medial and lateral gutter regions, and the peri-incisional tissues to help with postoperative analgesia. Meanwhile, the cement was being mixed on the back table. When it was ready, the tibial tray was cemented in first. The excess cement was removed using Personal assistant. Next, the femoral component was impacted into place. Again, the excess cement was removed using Personal assistant. The 14 mm trial insert was positioned and the knee brought into extension while the cement hardened. Finally, the patella was cemented into place and secured using the patellar clamp. Again, the excess cement was removed using Personal assistant. Once the cement had hardened, the knee was placed through a range of motion with the findings as described above. Therefore, the trial insert was removed and, after verifying that no cement had been retained posteriorly, the permanent 14 mm anterior stabilized E-polyethylene insert was positioned and secured using the appropriate key locking mechanism. Again the knee was  placed through a range of motion with the findings as described above.  The wound was copiously irrigated with sterile saline solution using the jet lavage system before the quadriceps tendon and retinacular layer were reapproximated using #0 Vicryl interrupted sutures. The superficial retinacular layer also was closed using a running #0 Vicryl suture. A total of 10 cc of transexemic acid (TXA) was injected intra-articularly before the subcutaneous tissues were closed in several layers using 2-0 Vicryl interrupted sutures. The skin was closed using staples. A sterile honeycomb dressing was applied to the skin before the leg was wrapped with an Ace wrap to accommodate the Polar Care device. The patient was then awakened and returned to the recovery room in satisfactory condition after tolerating the procedure well.

## 2020-08-07 NOTE — NC FL2 (Signed)
[place] MEDICAID FL2 LEVEL OF CARE SCREENING TOOL     IDENTIFICATION  Patient Name: Stacey [name] Birthdate: 17-Mar-1951 Sex: female Admission Date (Current Location): 08/07/2020  [place] and [place] Number:  Chiropodist and Address:  [facility], [address], [place], [place] [address]      Provider Number: [id number]  Attending Physician Name and Address:  Christena Flake, MD  Relative Name and Phone Number:  Oswaldo Done SPouse [phone]    Current Level of Care: Hospital Recommended Level of Care: Skilled Nursing Facility Prior Approval Number:    Date Approved/Denied:   PASRR Number: [id number]  Discharge Plan: SNF    Current Diagnoses: Patient Active Problem List   Diagnosis Date Noted  . Status post total knee replacement using cement, left 08/07/2020  . Acute CHF (congestive heart failure) (HCC) 10/11/2019  . Bacteremia due to Gram-negative bacteria 10/11/2019  . HLD (hyperlipidemia)   . Acute on chronic diastolic CHF (congestive heart failure) (HCC)   . Acute respiratory failure with hypoxia (HCC)   . Abnormal LFTs   . Hypokalemia   . Hypomagnesemia   . Elevated lactic acid level   . Elevated LFTs   . Hypothyroidism 10/01/2019  . Normocytic anemia 10/01/2019  . CKD (chronic kidney disease), stage IIIa 10/01/2019  . Closed fracture of right distal fibula 10/01/2019  . Morbid obesity with BMI of 40.0-44.9, adult (HCC) 10/01/2019  . Preop cardiovascular exam   . 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 Persistent atrial fibrillation (HCC) 09/26/2017  . Essential hypertension 09/16/2014  . Sleep apnea 12/20/2013  . Coronary artery disease   . Chronic diastolic CHF (congestive heart failure) (HCC)   . Obesity   . GERD (gastroesophageal reflux disease)   . Hypertensive cardiovascular disease   . Depression   . Presumed Obstructive Sleep Apnea   . Atrial fibrillation (HCC)     Orientation RESPIRATION  BLADDER Height & Weight     Self,Time,Situation,Place  Normal Continent Weight: 108.9 kg Height:  5\' 3"  (160 cm)  BEHAVIORAL SYMPTOMS/MOOD NEUROLOGICAL BOWEL NUTRITION STATUS      Continent Diet (heart healthy)  AMBULATORY STATUS COMMUNICATION OF NEEDS Skin   Extensive Assist Verbally Surgical wounds                       Personal Care Assistance Level of Assistance  Bathing,Dressing Bathing Assistance: Limited assistance   Dressing Assistance: Limited assistance     Functional Limitations Info             SPECIAL CARE FACTORS FREQUENCY  PT (By licensed PT)     PT Frequency: 5 times per week              Contractures      Additional Factors Info  Code Status,Allergies Code Status Info: full Allergies Info: Levaquin , Metoprolol, Tikosyn  Ace Inhibitors, Adhesive           Current Medications (08/07/2020):  This is the current hospital active medication list Current Facility-Administered Medications  Medication Dose Route Frequency Provider Last Rate Last Admin  . 0.9 %  sodium chloride infusion   Intravenous Continuous Poggi, 08/09/2020, MD 75 mL/hr at 08/07/20 1247 New Bag at 08/07/20 1247  . [START ON 08/08/2020] acetaminophen (TYLENOL) tablet 325-650 mg  325-650 mg Oral Q6H PRN Poggi, 08/10/2020, MD      . acetaminophen (TYLENOL) tablet 500 mg  500 mg Oral Q6H Poggi, Excell Seltzer,  MD   500 mg at 08/07/20 1241  . [START ON 08/08/2020] amiodarone (PACERONE) tablet 200 mg  200 mg Oral Daily Poggi, Excell Seltzer, MD      . Melene Muller ON 08/08/2020] apixaban (ELIQUIS) tablet 5 mg  5 mg Oral BID Poggi, Excell Seltzer, MD      . bisacodyl (DULCOLAX) suppository 10 mg  10 mg Rectal Daily PRN Poggi, Excell Seltzer, MD      . carvedilol (COREG) tablet 3.125 mg  3.125 mg Oral BID Poggi, Excell Seltzer, MD      . ceFAZolin (ANCEF) IVPB 2g/100 mL premix  2 g Intravenous Q6H Poggi, Excell Seltzer, MD      . chlorhexidine (PERIDEX) 0.12 % solution           . diphenhydrAMINE (BENADRYL) 12.5 MG/5ML elixir 12.5-25 mg  12.5-25  mg Oral Q4H PRN Poggi, Excell Seltzer, MD      . docusate sodium (COLACE) capsule 100 mg  100 mg Oral BID Poggi, Excell Seltzer, MD   100 mg at 08/07/20 1242  . furosemide (LASIX) tablet 20 mg  20 mg Oral Daily Poggi, Excell Seltzer, MD      . HYDROcodone-acetaminophen (NORCO) 10-325 MG per tablet 1 tablet  1 tablet Oral Q4H PRN Poggi, Excell Seltzer, MD   1 tablet at 08/07/20 1125  . ketorolac (TORADOL) 15 MG/ML injection 7.5 mg  7.5 mg Intravenous Q6H Poggi, Excell Seltzer, MD   7.5 mg at 08/07/20 1244  . [START ON 08/08/2020] levothyroxine (SYNTHROID) tablet 100 mcg  100 mcg Oral QAC breakfast Poggi, Excell Seltzer, MD      . magnesium hydroxide (MILK OF MAGNESIA) suspension 30 mL  30 mL Oral Daily PRN Poggi, Excell Seltzer, MD      . Melene Muller ON 08/08/2020] magnesium oxide (MAG-OX) tablet 400 mg  400 mg Oral Daily Poggi, Excell Seltzer, MD      . metoCLOPramide (REGLAN) tablet 5-10 mg  5-10 mg Oral Q8H PRN Poggi, Excell Seltzer, MD       Or  . metoCLOPramide (REGLAN) injection 5-10 mg  5-10 mg Intravenous Q8H PRN Poggi, Excell Seltzer, MD      . morphine 2 MG/ML injection 0.5-1 mg  0.5-1 mg Intravenous Q2H PRN Poggi, Excell Seltzer, MD   0.5 mg at 08/07/20 1257  . ondansetron (ZOFRAN) tablet 4 mg  4 mg Oral Q6H PRN Poggi, Excell Seltzer, MD       Or  . ondansetron (ZOFRAN) injection 4 mg  4 mg Intravenous Q6H PRN Poggi, Excell Seltzer, MD      . Melene Muller ON 08/08/2020] pantoprazole (PROTONIX) EC tablet 40 mg  40 mg Oral Daily Poggi, Excell Seltzer, MD      . rosuvastatin (CRESTOR) tablet 10 mg  10 mg Oral QHS Poggi, Excell Seltzer, MD      . sacubitril-valsartan (ENTRESTO) 49-51 mg per tablet  1 tablet Oral BID Poggi, Excell Seltzer, MD   1 tablet at 08/07/20 1243  . sodium phosphate (FLEET) 7-19 GM/118ML enema 1 enema  1 enema Rectal Once PRN Poggi, Excell Seltzer, MD      . traMADol Janean Sark) tablet 50 mg  50 mg Oral Q6H PRN Poggi, Excell Seltzer, MD      . venlafaxine [facility]) tablet 37.5 mg  37.5 mg Oral BID Poggi, Excell Seltzer, MD      . zolpidem (AMBIEN) tablet 5 mg  5 mg Oral QHS PRN Poggi, Excell Seltzer, MD         Discharge  Medications:  Please see discharge summary for a list of discharge medications.  Relevant Imaging Results:  Relevant Lab Results:   Additional Information SS# [id number]  Barrie Dunker, RN

## 2020-08-07 NOTE — Anesthesia Procedure Notes (Signed)
Spinal  Patient location during procedure: OR Start time: 08/07/2020 7:35 AM End time: 08/07/2020 7:39 AM Reason for block: surgical anesthesia Staffing Performed: other anesthesia staff  Preanesthetic Checklist Completed: patient identified, IV checked, site marked, risks and benefits discussed, surgical consent, monitors and equipment checked, pre-op evaluation and timeout performed Spinal Block Patient position: sitting Prep: ChloraPrep Patient monitoring: heart rate, continuous pulse ox, blood pressure and cardiac monitor Approach: midline Location: L4-5 Injection technique: single-shot Needle Needle type: Introducer and Pencan  Needle gauge: 22 G Assessment Sensory level: T10 Events: CSF return Additional Notes Negative paresthesia. Negative blood return. Positive free-flowing CSF. Expiration date of kit checked and confirmed. Patient tolerated procedure well, without complications.

## 2020-08-08 ENCOUNTER — Encounter: Payer: Self-pay | Admitting: Surgery

## 2020-08-08 LAB — BASIC METABOLIC PANEL
Anion gap: 11 (ref 5–15)
BUN: 32 mg/dL — ABNORMAL HIGH (ref 8–23)
CO2: 25 mmol/L (ref 22–32)
Calcium: 8.5 mg/dL — ABNORMAL LOW (ref 8.9–10.3)
Chloride: 103 mmol/L (ref 98–111)
Creatinine, Ser: 1.51 mg/dL — ABNORMAL HIGH (ref 0.44–1.00)
GFR, Estimated: 37 mL/min — ABNORMAL LOW (ref >60)
Glucose, Bld: 120 mg/dL — ABNORMAL HIGH (ref 70–99)
Potassium: 4.4 mmol/L (ref 3.5–5.1)
Sodium: 139 mmol/L (ref 135–145)

## 2020-08-08 LAB — CBC
HCT: 30.3 % — ABNORMAL LOW (ref 36.0–46.0)
Hemoglobin: 9.7 g/dL — ABNORMAL LOW (ref 12.0–15.0)
MCH: 29.5 pg (ref 26.0–34.0)
MCHC: 32 g/dL (ref 30.0–36.0)
MCV: 92.1 fL (ref 80.0–100.0)
Platelets: 200 10*3/uL (ref 150–400)
RBC: 3.29 MIL/uL — ABNORMAL LOW (ref 3.87–5.11)
RDW: 15.9 % — ABNORMAL HIGH (ref 11.5–15.5)
WBC: 7.7 10*3/uL (ref 4.0–10.5)
nRBC: 0 % (ref 0.0–0.2)

## 2020-08-08 MED ORDER — ZOLPIDEM TARTRATE 5 MG PO TABS
5.0000 mg | ORAL_TABLET | Freq: Every evening | ORAL | Status: DC | PRN
  Administered 2020-08-08: 5 mg via ORAL
  Filled 2020-08-08: qty 1

## 2020-08-08 NOTE — Anesthesia Postprocedure Evaluation (Signed)
Anesthesia Post Note  Patient: Stacey [name]  Procedure(s) Performed: TOTAL KNEE ARTHROPLASTY (Left Knee)  Patient location during evaluation: Nursing Unit Anesthesia Type: Spinal Level of consciousness: awake and alert Pain management: pain level controlled Respiratory status: spontaneous breathing Postop Assessment: no headache Anesthetic complications: no   No complications documented.   Last Vitals:  Vitals:   08/07/20 2103 08/08/20 0423  BP: 133/80 134/90  Pulse: (!) 107 93  Resp: 18 18  Temp: 36.7 C 37.2 C  SpO2: 95% 90%    Last Pain:  Vitals:   08/08/20 0509  TempSrc:   PainSc: 4                  Karmin Kasprzak Crissie Figures

## 2020-08-08 NOTE — TOC Progression Note (Signed)
Transition of Care [facility]) - Progression Note    Patient Details  Name: Stacey [name] MRN: [id number] Date of Birth: Jun 23, 1950  [facility]SW Contact  Barrie Dunker, RN Phone Number: 08/08/2020, 10:50 AM  Clinical Narrative:   Reviewed the bed offers with the patient, she chose [facility], Notified [employer], she had her booster in Feb and will get the dates of the Covid vaccines   Expected Discharge Plan: Skilled Nursing Facility Barriers to Discharge: Continued Medical Work up  Expected Discharge Plan and Services Expected Discharge Plan: Skilled Nursing Facility   Discharge Planning Services: CM Consult   Living arrangements for the past 2 months: Single Family Home                                       Social Determinants of Health (SDOH) Interventions    Readmission Risk Interventions No flowsheet data found.

## 2020-08-08 NOTE — Progress Notes (Signed)
Physical Therapy Treatment Patient Details Name: Stacey [name] MRN: [id number] DOB: April 01, 1951 Today's Date: 08/08/2020    History of Present Illness Patient s/p L TKA, WBAT. PMH of HTN, HLD, GERD, hypothyroidism, anxiety, PAF, morbid obesity, diastolic HF    PT Comments    Pt limited by pain this session. Reported 9.5/10, premedicated to session. Pt agreeable to bed level exercises, AAROM as needed due to pain. Pt also assisted to reposition in bed minA with use of bed rails. Pt with lacking ROM of L knee; 10degrees from 0, and 40degrees of flexion. Educated on importance of exercises, continued mobility and positioning LLE in TKE at rest, verbalized understanding. The patient would benefit from further skilled PT intervention to continue to progress towards goals. Recommendation remains appropriate.     Follow Up Recommendations  SNF     Equipment Recommendations  3in1 (PT)    Recommendations for Other Services OT consult     Precautions / Restrictions Precautions Precautions: Fall;Knee Precaution Booklet Issued: Yes (comment) Restrictions Weight Bearing Restrictions: Yes LLE Weight Bearing: Weight bearing as tolerated    Mobility  Bed Mobility Overal bed mobility: Needs Assistance Bed Mobility: Supine to Sit;Sit to Supine     Supine to sit: Min guard;HOB elevated Sit to supine: Min guard   General bed mobility comments: declined due to elevated pain    Transfers Overall transfer level: Needs assistance   Transfers: Lateral/Scoot Transfers          Lateral/Scoot Transfers: Min guard General transfer comment: declined due to pain  Ambulation/Gait             General Gait Details: declined due to pain   Stairs             Wheelchair Mobility    Modified Rankin (Stroke Patients Only)       Balance Overall balance assessment: Needs assistance Sitting-balance support: Feet supported;Single extremity supported Sitting balance-Leahy  Scale: Fair                                      Cognition Arousal/Alertness: Awake/alert Behavior During Therapy: WFL for tasks assessed/performed Overall Cognitive Status: Within Functional Limits for tasks assessed                                        Exercises Total Joint Exercises Quad Sets: AROM;Strengthening;Left;10 reps Short Arc Quad: AROM;Strengthening;Left;10 reps Heel Slides: Strengthening;Left;10 reps;AAROM Hip ABduction/ADduction: AAROM;Strengthening;Left;10 reps Goniometric ROM: 10 degrees from 0, and 40degrees flexion Other Exercises Other Exercises: Pt educated re: OT role, DME recs, d/c recs, falls prevention, ECS, polar care mgmt, compression stocking mgmt Other Exercises: LBD, sup<>sit, sitting balance/tolerance, lateral scoot    General Comments        Pertinent Vitals/Pain Pain Assessment: 0-10 Pain Score: 9  (9.5) Pain Location: L knee Pain Descriptors / Indicators: Aching;Sharp;Sore Pain Intervention(s): Limited activity within patient's tolerance;Monitored during session;Premedicated before session;Repositioned;Ice applied    Home Living Family/patient expects to be discharged to:: Private residence Living Arrangements: Spouse/significant other Available Help at Discharge: Family Type of Home: House Home Access: Stairs to enter Entrance Stairs-Rails: Left;Right Home Layout: 1/2 bath on main level;Bed/bath upstairs;Two level Home Equipment: Walker - 2 wheels;Cane - quad Additional Comments: pt with falls at least once a week due to her knee buckling  Prior Function Level of Independence: Independent with assistive device(s)          PT Goals (current goals can now be found in the care plan section) Acute Rehab PT Goals Patient Stated Goal: to improve function; decrease falls Progress towards PT goals: Progressing toward goals    Frequency    BID      PT Plan Current plan remains appropriate     Co-evaluation              AM-PAC PT "6 Clicks" Mobility   Outcome Measure  Help needed turning from your back to your side while in a flat bed without using bedrails?: A Little Help needed moving from lying on your back to sitting on the side of a flat bed without using bedrails?: A Little Help needed moving to and from a bed to a chair (including a wheelchair)?: A Little Help needed standing up from a chair using your arms (e.g., wheelchair or bedside chair)?: A Lot Help needed to walk in hospital room?: A Lot Help needed climbing 3-5 steps with a railing? : Total 6 Click Score: 14    End of Session   Activity Tolerance: Patient limited by pain Patient left: in bed;with family/visitor present;with call bell/phone within reach;with bed alarm set Nurse Communication: Mobility status PT Visit Diagnosis: Other abnormalities of gait and mobility (R26.89);Difficulty in walking, not elsewhere classified (R26.2);Pain;Muscle weakness (generalized) (M62.81) Pain - Right/Left: Left Pain - part of body: Knee     Time: 5638-7564 PT Time Calculation (min) (ACUTE ONLY): 19 min  Charges:  $Therapeutic Exercise: 8-22 mins                     Olga Coaster PT, DPT 3:57 PM,08/08/20

## 2020-08-08 NOTE — Progress Notes (Signed)
  Subjective: 1 Day Post-Op Procedure(s) (LRB): TOTAL KNEE ARTHROPLASTY (Left) Patient reports pain as moderate.  She reports some epigastric pain which she has experienced previously, possibly due to passing a gallstone. Plan is to go Skilled nursing facility after hospital stay. Negative for chest pain and shortness of breath Fever: no Gastrointestinal: negative for nausea and vomiting.  Patient has not had a bowel movement.  Objective: Vital signs in last 24 hours: Temp:  [97.5 F (36.4 C)-99.8 F (37.7 C)] 98.5 F (36.9 C) (04/15 1524) Pulse Rate:  [78-111] 93 (04/15 1524) Resp:  [16-18] 18 (04/15 1524) BP: (119-142)/(78-106) 140/87 (04/15 1524) SpO2:  [90 %-98 %] 95 % (04/15 1524)  Intake/Output from previous day:  Intake/Output Summary (Last 24 hours) at 08/08/2020 1620 Last data filed at 08/08/2020 1350 Gross per 24 hour  Intake 420 ml  Output 150 ml  Net 270 ml    Intake/Output this shift: Total I/O In: 420 [P.O.:120; IV Piggyback:300] Out: -   Labs: Recent Labs    08/08/20 0701  HGB 9.7*   Recent Labs    08/08/20 0701  WBC 7.7  RBC 3.29*  HCT 30.3*  PLT 200   Recent Labs    08/08/20 0701  NA 139  K 4.4  CL 103  CO2 25  BUN 32*  CREATININE 1.51*  GLUCOSE 120*  CALCIUM 8.5*   No results for input(s): LABPT, INR in the last 72 hours.   EXAM General - Patient is Alert, Appropriate and Oriented Extremity - Neurovascular intact Dorsiflexion/Plantar flexion intact Compartment soft Dressing/Incision -Postoperative dressing remains in place., Polar Care in place and working. , Minimal sanguinous drainage noted. Motor Function - intact, moving foot and toes well on exam.  Cardiovascular- Iregular rate and rhythm, no m/r/g Respiratory- Lungs clear to auscultation bilaterally Gastrointestinal- soft and active bowel sounds , minimal epigastric tenderness  Assessment/Plan: 1 Day Post-Op Procedure(s) (LRB): TOTAL KNEE ARTHROPLASTY (Left) Active  Problems:   Status post total knee replacement using cement, left  Estimated body mass index is 42.51 kg/m as calculated from the following:   Height as of this encounter: 5\' 3"  (1.6 m).   Weight as of this encounter: 108.9 kg. Advance diet Up with therapy  Likely discharge to SNF given PT progress so far.  DVT Prophylaxis - Eloquis, Ted hose and SCDs Weight-Bearing as tolerated to left leg  , PA-C [facility] Orthopaedic Surgery 08/08/2020, 4:20 PM

## 2020-08-08 NOTE — Progress Notes (Addendum)
Physical Therapy Treatment Patient Details Name: Stacey [name] MRN: [id number] DOB: [birth year] Today's Date: 08/08/2020    History of Present Illness Patient s/p L TKA, WBAT. PMH of HTN, HLD, GERD, hypothyroidism, anxiety, PAF, morbid obesity, diastolic HF    PT Comments    Patient alert, agreeable to PT, reported 8/10 in L knee, premedicated prior to session. Supine to sit with CGA and HOB elevated. Sit <> stand from several surfaces with RW and CGA, cued for technique and safe hand placement each time. She was able to ambulate ~67ft with RW and CGA, instructed in step to gait pattern due to increased pain and pt fatigue. Returned to recliner in room with all needs in reach. The patient would benefit from further skilled PT intervention to continue to progress towards goals. Recommendation remains appropriate. RN notified of pt pain status and voiding.        Follow Up Recommendations  SNF     Equipment Recommendations  3in1 (PT)    Recommendations for Other Services OT consult     Precautions / Restrictions Precautions Precautions: Fall;Knee Precaution Booklet Issued: No Restrictions Weight Bearing Restrictions: Yes LLE Weight Bearing: Weight bearing as tolerated    Mobility  Bed Mobility Overal bed mobility: Needs Assistance Bed Mobility: Supine to Sit     Supine to sit: Min guard;HOB elevated          Transfers Overall transfer level: Needs assistance Equipment used: Rolling walker (2 wheeled) Transfers: Sit to/from Stand Sit to Stand: Min guard         General transfer comment: cued for hand placement, sit <> stand from EOB, recliner, and BSC over standard commode. Cued for technique each time  Ambulation/Gait Ambulation/Gait assistance: Min guard Gait Distance (Feet): 20 Feet Assistive device: Rolling walker (2 wheeled)       General Gait Details: step to gait pattern for pain   Stairs             Wheelchair Mobility    Modified  Rankin (Stroke Patients Only)       Balance Overall balance assessment: Needs assistance Sitting-balance support: Feet supported;Single extremity supported Sitting balance-Leahy Scale: Fair       Standing balance-Leahy Scale: Fair                              Cognition Arousal/Alertness: Awake/alert Behavior During Therapy: WFL for tasks assessed/performed Overall Cognitive Status: Within Functional Limits for tasks assessed                                        Exercises Total Joint Exercises Ankle Circles/Pumps: AROM;Both;10 reps Quad Sets: AROM;Strengthening;Left;10 reps Heel Slides: AROM;Strengthening;Left;10 reps Other Exercises Other Exercises: Pt ambulated to bathroom, utilized BSC over standard commode; supervision, pericare for supervision    General Comments        Pertinent Vitals/Pain Pain Assessment: 0-10 Pain Score: 8  Pain Location: L knee Pain Descriptors / Indicators: Aching;Sharp;Sore Pain Intervention(s): Limited activity within patient's tolerance;Monitored during session;Premedicated before session;Repositioned;Ice applied    Home Living                      Prior Function            PT Goals (current goals can now be found in the care plan section) Progress towards PT goals:  Progressing toward goals    Frequency    BID      PT Plan Current plan remains appropriate    Co-evaluation              AM-PAC PT "6 Clicks" Mobility   Outcome Measure  Help needed turning from your back to your side while in a flat bed without using bedrails?: A Little Help needed moving from lying on your back to sitting on the side of a flat bed without using bedrails?: A Little Help needed moving to and from a bed to a chair (including a wheelchair)?: A Little Help needed standing up from a chair using your arms (e.g., wheelchair or bedside chair)?: A Lot Help needed to walk in hospital room?: A Lot Help  needed climbing 3-5 steps with a railing? : Total 6 Click Score: 14    End of Session Equipment Utilized During Treatment: Gait belt Activity Tolerance: Treatment limited secondary to medical complications (Comment);Other (comment);Patient limited by pain Patient left: in bed;with family/visitor present;with call bell/phone within reach;with bed alarm set Nurse Communication: Mobility status PT Visit Diagnosis: Other abnormalities of gait and mobility (R26.89);Difficulty in walking, not elsewhere classified (R26.2);Pain;Muscle weakness (generalized) (M62.81) Pain - Right/Left: Left Pain - part of body: Knee     Time: 1610-9604 PT Time Calculation (min) (ACUTE ONLY): 28 min  Charges:  $Gait Training: 8-22 mins $Therapeutic Activity: 8-22 mins                     Olga Coaster PT, DPT 10:12 AM,08/08/20

## 2020-08-08 NOTE — Evaluation (Signed)
Occupational Therapy Evaluation Patient Details Name: Stacey [name] MRN: [id number] DOB: 30-Aug-1950 Today's Date: 08/08/2020    History of Present Illness Patient s/p L TKA, WBAT. PMH of HTN, HLD, GERD, hypothyroidism, anxiety, PAF, morbid obesity, diastolic HF   Clinical Impression   Stacey [name] was seen for OT evaluation this date. Prior to hospital admission, pt was MOD I for mobility, reports multiple falls. Pt lives with family in 2 level home, 6 STE and 14 steps to bath/bed on second floor. Pt presents to acute OT demonstrating impaired ADL performance and functional mobility 2/2 decreased activity tolerance and functional ROM/balance deficits. Pt currently requires  MIN A don/doff R sock, MOD A don/doff L sock seated EOB. SETUP + SUPERVISION sitting ADLs. Pt instructed in polar care mgt, falls prevention strategies, home/routines modifications, DME/AE for LB bathing/dressing tasks, and compression stocking mgt. Pt would benefit from skilled OT to address noted impairments and functional limitations (see below for any additional details) in order to maximize safety and independence while minimizing falls risk and caregiver burden. Upon hospital discharge, recommend STR to maximize pt safety and return to PLOF.     Follow Up Recommendations  SNF    Equipment Recommendations  3 in 1 bedside commode    Recommendations for Other Services       Precautions / Restrictions Precautions Precautions: Fall;Knee Precaution Booklet Issued: No Restrictions Weight Bearing Restrictions: Yes LLE Weight Bearing: Weight bearing as tolerated      Mobility Bed Mobility Overal bed mobility: Needs Assistance Bed Mobility: Supine to Sit;Sit to Supine     Supine to sit: Min guard;HOB elevated Sit to supine: Min guard        Transfers Overall transfer level: Needs assistance   Transfers: Lateral/Scoot Transfers          Lateral/Scoot Transfers: Min guard      Balance Overall  balance assessment: Needs assistance Sitting-balance support: Feet supported;Single extremity supported Sitting balance-Leahy Scale: Fair                                     ADL either performed or assessed with clinical judgement   ADL Overall ADL's : Needs assistance/impaired                                       General ADL Comments: MIN A don/doff R sock, MOD A don/doff L sock seated EOB. SETUP + SUPERVISION sitting ADLs                  Pertinent Vitals/Pain Pain Assessment: 0-10 Pain Score: 6  Pain Location: L knee Pain Descriptors / Indicators: Aching;Sharp;Sore Pain Intervention(s): Limited activity within patient's tolerance;Premedicated before session     Hand Dominance     Extremity/Trunk Assessment Upper Extremity Assessment Upper Extremity Assessment: Overall WFL for tasks assessed   Lower Extremity Assessment Lower Extremity Assessment: Generalized weakness LLE Deficits / Details: s/p L TKA       Communication Communication Communication: No difficulties   Cognition Arousal/Alertness: Awake/alert Behavior During Therapy: WFL for tasks assessed/performed Overall Cognitive Status: Within Functional Limits for tasks assessed                                     General Comments  Exercises Exercises: Other exercises Other Exercises Other Exercises: Pt educated re: OT role, DME recs, d/c recs, falls prevention, ECS, polar care mgmt, compression stocking mgmt Other Exercises: LBD, sup<>sit, sitting balance/tolerance, lateral scoot   Shoulder Instructions      Home Living Family/patient expects to be discharged to:: Private residence Living Arrangements: Spouse/significant other Available Help at Discharge: Family Type of Home: House Home Access: Stairs to enter Secretary/administrator of Steps: 6 Entrance Stairs-Rails: Left;Right Home Layout: 1/2 bath on main level;Bed/bath upstairs;Two  level Alternate Level Stairs-Number of Steps: 14 Alternate Level Stairs-Rails: Right;Left Bathroom Shower/Tub: Tub/shower unit         Home Equipment: Environmental consultant - 2 wheels;Cane - quad   Additional Comments: pt with falls at least once a week due to her knee buckling      Prior Functioning/Environment Level of Independence: Independent with assistive device(s)                 OT Problem List: Decreased strength;Decreased range of motion;Decreased activity tolerance;Impaired balance (sitting and/or standing);Decreased safety awareness;Decreased knowledge of use of DME or AE      OT Treatment/Interventions: Self-care/ADL training;Therapeutic exercise;Energy conservation;DME and/or AE instruction;Therapeutic activities;Patient/family education;Balance training    OT Goals(Current goals can be found in the care plan section) Acute Rehab OT Goals Patient Stated Goal: to improve function; decrease falls OT Goal Formulation: With patient Time For Goal Achievement: 08/22/20 Potential to Achieve Goals: Good ADL Goals Pt Will Perform Grooming: with modified independence;standing (c LRAD PRN) Pt Will Perform Lower Body Dressing: with set-up;with supervision;sit to/from stand (c LRAD & AE PRN) Pt Will Transfer to Toilet: with modified independence;ambulating;bedside commode (c LRAD PRN)  OT Frequency: Min 1X/week   Barriers to D/C: Inaccessible home environment             AM-PAC OT "6 Clicks" Daily Activity     Outcome Measure Help from another person eating meals?: None Help from another person taking care of personal grooming?: A Little Help from another person toileting, which includes using toliet, bedpan, or urinal?: A Little Help from another person bathing (including washing, rinsing, drying)?: A Lot Help from another person to put on and taking off regular upper body clothing?: A Little Help from another person to put on and taking off regular lower body clothing?: A  Lot 6 Click Score: 17   End of Session    Activity Tolerance: Patient tolerated treatment well;Patient limited by fatigue Patient left: in bed;with call bell/phone within reach;with bed alarm set  OT Visit Diagnosis: Unsteadiness on feet (R26.81);Muscle weakness (generalized) (M62.81)                Time: 2376-2831 OT Time Calculation (min): 15 min Charges:  OT General Charges $OT Visit: 1 Visit OT Evaluation $OT Eval Low Complexity: 1 Low OT Treatments $Self Care/Home Management : 8-22 mins  Kathie Dike, M.S. OTR/L  08/08/20, 2:48 PM  ascom [phone]

## 2020-08-09 LAB — CBC
HCT: 29.5 % — ABNORMAL LOW (ref 36.0–46.0)
Hemoglobin: 9.7 g/dL — ABNORMAL LOW (ref 12.0–15.0)
MCH: 30 pg (ref 26.0–34.0)
MCHC: 32.9 g/dL (ref 30.0–36.0)
MCV: 91.3 fL (ref 80.0–100.0)
Platelets: 208 10*3/uL (ref 150–400)
RBC: 3.23 MIL/uL — ABNORMAL LOW (ref 3.87–5.11)
RDW: 16.3 % — ABNORMAL HIGH (ref 11.5–15.5)
WBC: 9.8 10*3/uL (ref 4.0–10.5)
nRBC: 0 % (ref 0.0–0.2)

## 2020-08-09 LAB — BASIC METABOLIC PANEL
Anion gap: 10 (ref 5–15)
BUN: 26 mg/dL — ABNORMAL HIGH (ref 8–23)
CO2: 24 mmol/L (ref 22–32)
Calcium: 8.5 mg/dL — ABNORMAL LOW (ref 8.9–10.3)
Chloride: 103 mmol/L (ref 98–111)
Creatinine, Ser: 1.2 mg/dL — ABNORMAL HIGH (ref 0.44–1.00)
GFR, Estimated: 49 mL/min — ABNORMAL LOW (ref >60)
Glucose, Bld: 123 mg/dL — ABNORMAL HIGH (ref 70–99)
Potassium: 3.7 mmol/L (ref 3.5–5.1)
Sodium: 137 mmol/L (ref 135–145)

## 2020-08-09 MED ORDER — ZOLPIDEM TARTRATE 5 MG PO TABS
10.0000 mg | ORAL_TABLET | Freq: Every evening | ORAL | Status: DC | PRN
  Administered 2020-08-09 – 2020-08-12 (×4): 10 mg via ORAL
  Filled 2020-08-09 (×4): qty 2

## 2020-08-09 NOTE — Plan of Care (Signed)
Patient had an uneventful shift. No changes in neurological and neurovascular assessments. Pain controlled with PRN medications. Complaint of nausea over shift,controlled by Zofran and Reglan.Denies any needs at this time. All Safety measures maintained. Care continues.  Problem: Education: Goal: Knowledge of the prescribed therapeutic regimen will improve Outcome: Progressing Goal: Individualized Educational Video(s) Outcome: Progressing   Problem: Activity: Goal: Ability to avoid complications of mobility impairment will improve Outcome: Progressing Goal: Range of joint motion will improve Outcome: Progressing   Problem: Clinical Measurements: Goal: Postoperative complications will be avoided or minimized Outcome: Progressing   Problem: Pain Management: Goal: Pain level will decrease with appropriate interventions Outcome: Progressing   Problem: Skin Integrity: Goal: Will show signs of wound healing Outcome: Progressing

## 2020-08-09 NOTE — Progress Notes (Signed)
Physical Therapy Treatment Patient Details Name: Stacey [name] MRN: [id number] DOB: May 28, 1950 Today's Date: 08/09/2020    History of Present Illness Patient s/p L TKA, WBAT. PMH of HTN, HLD, GERD, hypothyroidism, anxiety, PAF, morbid obesity, diastolic HF    PT Comments    Pt was long sitting in bed upon arriving. Agrees to PT session and is cooperative and pleasant throughout. She does endorse 6/10 pain however did not limit session progression. Required more assistance to exit L side of bed today. Stood to [employer] with CGA, ambulated ~ 15 ft with slow step to gait pattern. No LOB or unsteadiness. Pt unwilling to ambulate further distances due to pain. Was able to achieve 78 degrees flexion while seated in recliner. Overall pt tolerated session. She is planning to DC to [facility] for rehab at DC. Acute PT will continue to follow per POC.   Follow Up Recommendations  SNF     Equipment Recommendations  3in1 (PT)       Precautions / Restrictions Precautions Precautions: Fall;Knee Precaution Booklet Issued: Yes (comment) Restrictions Weight Bearing Restrictions: Yes LLE Weight Bearing: Weight bearing as tolerated    Mobility  Bed Mobility Overal bed mobility: Needs Assistance Bed Mobility: Supine to Sit     Supine to sit: HOB elevated;Min assist;Mod assist     General bed mobility comments: pt required increased assistance due to pain to exit bed.    Transfers Overall transfer level: Needs assistance Equipment used: Rolling walker (2 wheeled) Transfers: Sit to/from Stand Sit to Stand: Min guard         General transfer comment: CGA for safety. vcs for improved technique. bed height at lowest setting however pt is short.  Ambulation/Gait Ambulation/Gait assistance: Min guard Gait Distance (Feet): 15 Feet Assistive device: Rolling walker (2 wheeled) Gait Pattern/deviations: Step-to pattern;Antalgic Gait velocity: decreased   General Gait Details: Pt wa sable to  tolerate ambulation 15 ft with RW + slow step to gait kinematics. requested not to go further distances due to pain.      Balance Overall balance assessment: Needs assistance Sitting-balance support: Feet supported Sitting balance-Leahy Scale: Good Sitting balance - Comments: no LOB seated EOB   Standing balance support: Bilateral upper extremity supported;During functional activity Standing balance-Leahy Scale: Fair Standing balance comment: reliant on BUE support       Cognition Arousal/Alertness: Awake/alert Behavior During Therapy: WFL for tasks assessed/performed Overall Cognitive Status: Within Functional Limits for tasks assessed      General Comments: Pt is A and O x 4. cooperative and pleasant      Exercises Total Joint Exercises Heel Slides: AROM;5 reps;Left;AAROM Goniometric ROM: 78 degrees flexion        Pertinent Vitals/Pain Pain Assessment: 0-10 Pain Score: 6  Pain Location: L knee Pain Descriptors / Indicators: Aching;Sharp;Sore Pain Intervention(s): Limited activity within patient's tolerance;Monitored during session;Repositioned;Premedicated before session;Ice applied           PT Goals (current goals can now be found in the care plan section) Acute Rehab PT Goals Patient Stated Goal: Rehab then home Progress towards PT goals: Progressing toward goals    Frequency    BID      PT Plan Current plan remains appropriate       AM-PAC PT "6 Clicks" Mobility   Outcome Measure  Help needed turning from your back to your side while in a flat bed without using bedrails?: A Little Help needed moving from lying on your back to sitting on the side  of a flat bed without using bedrails?: A Lot Help needed moving to and from a bed to a chair (including a wheelchair)?: A Little Help needed standing up from a chair using your arms (e.g., wheelchair or bedside chair)?: A Little Help needed to walk in hospital room?: A Little Help needed climbing 3-5  steps with a railing? : A Lot 6 Click Score: 16    End of Session Equipment Utilized During Treatment: Gait belt Activity Tolerance: Patient limited by pain Patient left: in chair;with call bell/phone within reach;with chair alarm set Nurse Communication: Mobility status PT Visit Diagnosis: Other abnormalities of gait and mobility (R26.89);Difficulty in walking, not elsewhere classified (R26.2);Pain;Muscle weakness (generalized) (M62.81) Pain - Right/Left: Left Pain - part of body: Knee     Time: 1135-1203 PT Time Calculation (min) (ACUTE ONLY): 28 min  Charges:  $Gait Training: 8-22 mins $Therapeutic Exercise: 8-22 mins                     Jetta Lout PTA 08/09/20, 12:54 PM

## 2020-08-09 NOTE — Progress Notes (Addendum)
  Subjective: 2 Days Post-Op Procedure(s) (LRB): TOTAL KNEE ARTHROPLASTY (Left) Patient reports pain as moderate.   Patient is somewhat improved from yesterday, but does request increase in her Ambien to 10 mg, which is her home dose.  Plan is to go Skilled nursing facility after hospital stay. Negative for chest pain and shortness of breath Fever: no Gastrointestinal: positive for nausea, no vomiting.   Patient has had a bowel movement.  Objective: Vital signs in last 24 hours: Temp:  [97.9 F (36.6 C)-99.3 F (37.4 C)] 98.6 F (37 C) (04/16 0843) Pulse Rate:  [84-104] 84 (04/16 0843) Resp:  [18] 18 (04/16 0843) BP: (130-156)/(79-96) 149/95 (04/16 0843) SpO2:  [93 %-98 %] 98 % (04/16 0843)  Intake/Output from previous day:  Intake/Output Summary (Last 24 hours) at 08/09/2020 1121 Last data filed at 08/09/2020 0959 Gross per 24 hour  Intake 240 ml  Output --  Net 240 ml    Intake/Output this shift: Total I/O In: 240 [P.O.:240] Out: -   Labs: Recent Labs    08/08/20 0701 08/09/20 0539  HGB 9.7* 9.7*   Recent Labs    08/08/20 0701 08/09/20 0539  WBC 7.7 9.8  RBC 3.29* 3.23*  HCT 30.3* 29.5*  PLT 200 208   Recent Labs    08/08/20 0701 08/09/20 0539  NA 139 137  K 4.4 3.7  CL 103 103  CO2 25 24  BUN 32* 26*  CREATININE 1.51* 1.20*  GLUCOSE 120* 123*  CALCIUM 8.5* 8.5*   No results for input(s): LABPT, INR in the last 72 hours.   EXAM General - Patient is Alert, Appropriate and Oriented Extremity - Neurovascular intact Dorsiflexion/Plantar flexion intact Compartment soft Dressing/Incision -mild sanguinous drainage , Polar Care in place and working  Motor Function - intact, moving foot and toes well on exam.  Cardiovascular- irregular rate and rhythm, no m/r/g Respiratory- Lungs clear to auscultation bilaterally Gastrointestinal- soft, nontender and hypoactive bowel sounds   Assessment/Plan: 2 Days Post-Op Procedure(s) (LRB): TOTAL KNEE  ARTHROPLASTY (Left) Active Problems:   Status post total knee replacement using cement, left  Estimated body mass index is 42.51 kg/m as calculated from the following:   Height as of this encounter: 5\' 3"  (1.6 m).   Weight as of this encounter: 108.9 kg. Advance diet Up with therapy  Discharge to SNF continues to be appropriate.   Called and spoke with pharmacy regarding Ambien, Ambien changed to 10 mg qhs.  DVT Prophylaxis - Eloquis and foot pumps Weight-Bearing as tolerated to left leg  , PA-C [facility] 08/09/2020, 11:21 AM

## 2020-08-10 DIAGNOSIS — I5042 Chronic combined systolic (congestive) and diastolic (congestive) heart failure: Secondary | ICD-10-CM

## 2020-08-10 DIAGNOSIS — I4819 Other persistent atrial fibrillation: Secondary | ICD-10-CM

## 2020-08-10 LAB — BASIC METABOLIC PANEL
Anion gap: 12 (ref 5–15)
BUN: 25 mg/dL — ABNORMAL HIGH (ref 8–23)
CO2: 25 mmol/L (ref 22–32)
Calcium: 8.7 mg/dL — ABNORMAL LOW (ref 8.9–10.3)
Chloride: 100 mmol/L (ref 98–111)
Creatinine, Ser: 1.19 mg/dL — ABNORMAL HIGH (ref 0.44–1.00)
GFR, Estimated: 50 mL/min — ABNORMAL LOW (ref >60)
Glucose, Bld: 110 mg/dL — ABNORMAL HIGH (ref 70–99)
Potassium: 3.9 mmol/L (ref 3.5–5.1)
Sodium: 137 mmol/L (ref 135–145)

## 2020-08-10 LAB — CBC
HCT: 30.8 % — ABNORMAL LOW (ref 36.0–46.0)
Hemoglobin: 9.7 g/dL — ABNORMAL LOW (ref 12.0–15.0)
MCH: 29.6 pg (ref 26.0–34.0)
MCHC: 31.5 g/dL (ref 30.0–36.0)
MCV: 93.9 fL (ref 80.0–100.0)
Platelets: 214 10*3/uL (ref 150–400)
RBC: 3.28 MIL/uL — ABNORMAL LOW (ref 3.87–5.11)
RDW: 16.5 % — ABNORMAL HIGH (ref 11.5–15.5)
WBC: 10.1 10*3/uL (ref 4.0–10.5)
nRBC: 0 % (ref 0.0–0.2)

## 2020-08-10 MED ORDER — VERAPAMIL HCL 2.5 MG/ML IV SOLN
5.0000 mg | Freq: Once | INTRAVENOUS | Status: AC
  Administered 2020-08-10: 5 mg via INTRAVENOUS
  Filled 2020-08-10: qty 2

## 2020-08-10 NOTE — Progress Notes (Signed)
Secure chat sent to Dr. Joice Lofts:  "Pt is tachycardic this morning with a HR of 150. She has been running 90-110 during her stay, but this morning she is holding steady between 140-158 at rest. She denies chest pain and SOB. I will give all of her morning meds now, but do you want to place her on tele and do an EKG? Thanks"  STAT EKG ordered and pt placed on telemetry monitor. All scheduled medications administered by RN. Charge RN updated.  RN will continue to monitor.

## 2020-08-10 NOTE — Progress Notes (Signed)
PT Cancellation Note  Patient Details Name: Stacey [name] MRN: [id number] DOB: 1950-06-01   Cancelled Treatment:    Reason Eval/Treat Not Completed: Medical issues which prohibited therapy   Chart reviewed.  Will hold this AM and monitor pt to see if PM session is appropriate as medical concerns are addressed.   Danielle Dess 08/10/2020, 9:39 AM

## 2020-08-10 NOTE — Progress Notes (Signed)
Physical Therapy Treatment Patient Details Name: Stacey [name] MRN: [id number] DOB: 05-01-1950 Today's Date: 08/10/2020    History of Present Illness Patient s/p L TKA, WBAT. PMH of HTN, HLD, GERD, hypothyroidism, anxiety, PAF, morbid obesity, diastolic HF    PT Comments    Nurse reports pt received HR medication per cardiology note & pt cleared for participation. Pt received in bed, declining gait on this date. Pt agreeable to exercises in recliner & performs them as noted below. HR noted to be 108-134 bpm during activity; averaging 120's-130's. Pt then lowers foot rest & sits up when HR increases to 149 bpm so pt instructed to recline back with BLE elevated & session ended - nurse made aware of HR. Pt reports no adverse symptoms. Will continue to progress transfers & gait as able & as pt is medically appropriate to progress.    Follow Up Recommendations  SNF     Equipment Recommendations  3in1 (PT)    Recommendations for Other Services       Precautions / Restrictions Precautions Precautions: Fall;Knee Restrictions Weight Bearing Restrictions: Yes LLE Weight Bearing: Weight bearing as tolerated    Mobility  Bed Mobility                    Transfers                    Ambulation/Gait                 Stairs             Wheelchair Mobility    Modified Rankin (Stroke Patients Only)       Balance                                            Cognition Arousal/Alertness: Awake/alert Behavior During Therapy: WFL for tasks assessed/performed Overall Cognitive Status: Within Functional Limits for tasks assessed                                 General Comments: Pt is A and O x 4. cooperative and pleasant      Exercises Total Joint Exercises Ankle Circles/Pumps: AROM;Both;10 reps Quad Sets: AROM;Strengthening;Left;10 reps Short Arc Quad: AROM;Strengthening;Left;10 reps Heel Slides: Left;AAROM;10  reps Hip ABduction/ADduction: AAROM;Strengthening;Left;10 reps Straight Leg Raises: AAROM;Strengthening;Left;10 reps    General Comments        Pertinent Vitals/Pain Pain Assessment: Faces Faces Pain Scale: Hurts little more Pain Location: back Pain Descriptors / Indicators: Aching;Sore Pain Intervention(s): Monitored during session    Home Living                      Prior Function            PT Goals (current goals can now be found in the care plan section) Acute Rehab PT Goals Patient Stated Goal: Rehab then home PT Goal Formulation: With patient Time For Goal Achievement: 08/21/20 Potential to Achieve Goals: Good Progress towards PT goals: Progressing toward goals    Frequency    BID      PT Plan Current plan remains appropriate    Co-evaluation              AM-PAC PT "6 Clicks" Mobility   Outcome Measure  Help needed turning from  your back to your side while in a flat bed without using bedrails?: A Little Help needed moving from lying on your back to sitting on the side of a flat bed without using bedrails?: A Lot Help needed moving to and from a bed to a chair (including a wheelchair)?: A Little Help needed standing up from a chair using your arms (e.g., wheelchair or bedside chair)?: A Little Help needed to walk in hospital room?: A Little Help needed climbing 3-5 steps with a railing? : A Lot 6 Click Score: 16    End of Session   Activity Tolerance: Treatment limited secondary to medical complications (Comment) Patient left: in chair;with call bell/phone within reach Nurse Communication:  (HR) PT Visit Diagnosis: Other abnormalities of gait and mobility (R26.89);Difficulty in walking, not elsewhere classified (R26.2);Pain;Muscle weakness (generalized) (M62.81) Pain - Right/Left: Left Pain - part of body: Knee     Time: 1610-9604 PT Time Calculation (min) (ACUTE ONLY): 8 min  Charges:  $Therapeutic Exercise: 8-22 mins                      Aleda Grana, PT, DPT 08/10/20, 1:48 PM    Sandi Mariscal 08/10/2020, 1:47 PM

## 2020-08-10 NOTE — Plan of Care (Signed)

## 2020-08-10 NOTE — Progress Notes (Signed)
  Subjective: 3 Days Post-Op Procedure(s) (LRB): TOTAL KNEE ARTHROPLASTY (Left) Patient reports pain as well-controlled.   Patient does complain of increased fatigue today. Patient is aymptomatic, but has had increased HR this AM. Cardiology was consulted and a stat EKG ordered.  Plan is to go Skilled nursing facility after hospital stay. Negative for chest pain and shortness of breath Fever: no Gastrointestinal: negative for nausea and vomiting.   Patient has had a bowel movement.  Objective: Vital signs in last 24 hours: Temp:  [98.4 F (36.9 C)-99.5 F (37.5 C)] 98.4 F (36.9 C) (04/17 0942) Pulse Rate:  [98-137] 133 (04/17 0942) Resp:  [18-20] 18 (04/17 0850) BP: (106-147)/(81-123) 106/81 (04/17 0942) SpO2:  [91 %-96 %] 91 % (04/17 0942)  Intake/Output from previous day:  Intake/Output Summary (Last 24 hours) at 08/10/2020 1124 Last data filed at 08/10/2020 0956 Gross per 24 hour  Intake 360 ml  Output --  Net 360 ml    Intake/Output this shift: Total I/O In: 120 [P.O.:120] Out: -   Labs: Recent Labs    08/08/20 0701 08/09/20 0539  HGB 9.7* 9.7*   Recent Labs    08/08/20 0701 08/09/20 0539  WBC 7.7 9.8  RBC 3.29* 3.23*  HCT 30.3* 29.5*  PLT 200 208   Recent Labs    08/09/20 0539 08/10/20 0625  NA 137 137  K 3.7 3.9  CL 103 100  CO2 24 25  BUN 26* 25*  CREATININE 1.20* 1.19*  GLUCOSE 123* 110*  CALCIUM 8.5* 8.7*   No results for input(s): LABPT, INR in the last 72 hours.   EXAM General - Patient is Alert, Appropriate and Oriented Extremity - Neurovascular intact Dorsiflexion/Plantar flexion intact Compartment soft Dressing/Incision -Polar Care in place and working. , Hemovac in place.  Motor Function - intact, moving foot and toes well on exam. Mild sanguinous drainage noted. Cardiovascular- irregular rate and rhythm, no m/r/g Respiratory- Lungs clear to auscultation bilaterally Gastrointestinal- soft, nontender and active bowel  sounds   Assessment/Plan: 3 Days Post-Op Procedure(s) (LRB): TOTAL KNEE ARTHROPLASTY (Left) Active Problems:   Status post total knee replacement using cement, left  Estimated body mass index is 42.51 kg/m as calculated from the following:   Height as of this encounter: 5\' 3"  (1.6 m).   Weight as of this encounter: 108.9 kg. Advance diet Up with therapy (after verapamil, per cards)  Increased fatigue -felt likely to be due in part to sustained tachycardia -CBC ordered   Discharge plan remains appropriate. Per cardiology, patient is ok to d/c as long as HR stays under 125   Honeycomb dressing changed. TED hose applied. Polar Care in place and working.  DVT Prophylaxis - Eloquis, Ted hose and foot pumps Weight-Bearing as tolerated to left leg  , PA-C [facility] 08/10/2020, 11:24 AM

## 2020-08-10 NOTE — Consult Note (Addendum)
Cardiology Consultation:   Patient ID: Stacey [name] MRN: [id number]; DOB: [birth year]  Admit date: 08/07/2020 Date of Consult: 08/10/2020  PCP:  Stacey Arbour, MD   [facility]  Cardiologist:  Stacey Bears, MD  Advanced Practice Provider:  No care team member to display Electrophysiologist:  Stacey Manges, MD [phone]    Patient Profile:   Stacey [name] is a 70 y.o. female with a hx of atrial fib who is being seen today for the evaluation of uncontrolled atrial fib at the request of Stacey [name].  History of Present Illness:   Stacey [name] is very pleasant 70 yo woman with long standing atrial fib who has had 7 DCCV's. She was in atrial fib on 4/14 and her rate was controlled (107/min). She has undergone knee surgery with a left total knee arthroplasty. She was noted to have an increased heart rate earlier today and her ECG demonstrates atrial fib with a RVR. She has minimal increased dyspnea and no chest pain. No syncope.    Past Medical History:  Diagnosis Date  . Anxiety   . Aortic atherosclerosis (HCC)   . Arthritis    "knees" (09/26/2017)  . Bacteremia    a.09/2019 E. coli bacteremia - unclear source.  . 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 Dx 08/2013 in setting of rapid afib;  b. 08/2013 Echo: EF 55-60%, mildly dil LA, nl RV; c. 08/2017 Echo: EF 20-25% (in afib); d. 11/2017 Echo: EF 50-55%; e. 09/2019 Echo: EF 55-60%, no rwma.  . CKD (chronic kidney disease), stage III (HCC)   . 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 GERD (gastroesophageal reflux disease)   . History of blood transfusion 1975   "related to ectopic pregnancy  . HLD (hyperlipidemia)   . Hypertension   . Hypothyroidism   . Non-obstructive CAD    a. 10/2013 Myoview: anteroseptal, apical, septal mild ischemia, nl LV fxn;  b. Cath: LAD 99m LCX 20ost, RCA 32m.  . Obesity   . OSA on CPAP   . Paroxysmal atrial fibrillation (HCC)    a. Dx 08/2013.  CHA2DS2VASc = 4 (Eliquis); b. Prev on flecainide w/ multiple DCCVs; c. 09/2017 Tikosyn added-->TdP 2/2 prolonged QT->amio; d. 11/2018 s/p DCCV-amio cont; e. 01/2020 recurrent Afib on low-dose amio-->DCCV (200J x 3), Amio cont @ 200 Daily.  . Pneumonia    "1-2 times" (09/26/2017)  . Systolic ejection murmur   . Tachycardia induced cardiomyopathy (HCC)    a. 08/2017 Echo: EF 20-25%, diff HK in setting of Afib w/ RVR; b. 11/2017 Echo: EF 50-55%; c. 09/2019 Echo: EF 55-60%.    Past Surgical History:  Procedure Laterality Date  . BREAST BIOPSY Right 2007   stereo-neg  . CARDIAC CATHETERIZATION  10/2013   [facility]  . CARDIOVERSION N/A 08/29/2017   Procedure: CARDIOVERSION;  Surgeon: Iran Ouch, MD;  Location: [facility] ORS;  Service: Cardiovascular;  Laterality: N/A;  . CARDIOVERSION     "I've had a total of 4" (09/26/2017)  . CARDIOVERSION N/A 12/08/2018   Procedure: CARDIOVERSION;  Surgeon: Antonieta Iba, MD;  Location: [facility] OR[facility];  Service: Cardiovascular;  Laterality: N/A;  . CARDIOVERSION N/A 02/18/2020   Procedure: CARDIOVERSION;  Surgeon: Iran Ouch, MD;  Location: [facility];  Service: Cardiovascular;  Laterality: N/A;  . ECTOPIC PREGNANCY SURGERY  1975  . ORIF ANKLE FRACTURE Right 10/01/2019   Procedure: OPEN REDUCTION INTERNAL FIXATION (ORIF) ANKLE FRACTURE;  Surgeon: Christena Flake,  MD;  Location: [facility] ORS;  Service: Orthopedics;  Laterality: Right;  . TONSILLECTOMY    . TOTAL KNEE ARTHROPLASTY Left 08/07/2020   Procedure: TOTAL KNEE ARTHROPLASTY;  Surgeon: Christena Flake, MD;  Location: [facility] ORS;  Service: Orthopedics;  Laterality: Left;     Home Medications:  Prior to Admission medications   Medication Sig Start Date End Date Taking? Authorizing Provider  amiodarone (PACERONE) 200 MG tablet Take 200 mg by mouth daily.   Yes [provider]  carvedilol (COREG) 3.125 MG tablet Take 1 tablet (3.125 mg total) by mouth 2 (two) times daily. 12/13/19 03/12/20 Yes Creig Hines, NP  diclofenac sodium (VOLTAREN) 1 % GEL Apply 1 application topically 4 (four) times daily as needed (knee pain).   Yes [provider]  ELIQUIS 5 MG TABS tablet TAKE 1 TABLET(5 MG) BY MOUTH TWICE DAILY 03/17/20  Yes Iran Ouch, MD  ENTRESTO 49-51 MG TAKE 1 TABLET BY MOUTH TWICE DAILY 02/25/20  Yes Iran Ouch, MD  esomeprazole (NEXIUM) 40 MG capsule TAKE 1 CAPSULE BY MOUTH EVERY MORNING 12/15/17  Yes Duke Salvia, MD  furosemide (LASIX) 20 MG tablet Take 1 tablet (20 mg total) by mouth daily. 07/29/20  Yes Iran Ouch, MD  HYDROcodone-acetaminophen (NORCO) 10-325 MG tablet Take 1 tablet by mouth every 6 (six) hours as needed for moderate pain or severe pain.    Yes [provider]  levothyroxine (SYNTHROID) 100 MCG tablet Take 100 mcg by mouth daily before breakfast. 10/02/18  Yes [provider]  magnesium oxide (MAG-OX) 400 MG tablet Take 400 mg by mouth daily.   Yes [provider]  rosuvastatin (CRESTOR) 10 MG tablet Take 10 mg by mouth at bedtime. 09/29/19  Yes [provider]  venlafaxine (EFFEXOR) 37.5 MG tablet Take 37.5 mg by mouth 2 (two) times daily. 01/19/20  Yes [provider]  zolpidem (AMBIEN) 10 MG tablet Take 10 mg by mouth at bedtime.   Yes [provider]  HYDROcodone-acetaminophen (NORCO) 10-325 MG tablet Take 1 tablet by mouth every 4 (four) hours as needed for moderate pain. 08/07/20   Anson Oregon, PA-C  ondansetron (ZOFRAN) 4 MG tablet Take 4 mg by mouth every 8 (eight) hours as needed for nausea or vomiting. Patient not taking: Reported on 08/07/2020    [provider]  ondansetron (ZOFRAN) 4 MG tablet Take 1 tablet (4 mg total) by mouth every 6 (six) hours as needed for nausea. 08/07/20   Anson [place], PA-C    Inpatient Medications: Scheduled Meds: . amiodarone  200 mg Oral Daily  . apixaban  5 mg Oral BID  . carvedilol  3.125 mg Oral BID  . docusate sodium  100  mg Oral BID  . furosemide  20 mg Oral Daily  . levothyroxine  100 mcg Oral QAC breakfast  . magnesium oxide  400 mg Oral Daily  . pantoprazole  40 mg Oral Daily  . rosuvastatin  10 mg Oral QHS  . sacubitril-valsartan  1 tablet Oral BID  . venlafaxine  37.5 mg Oral BID   Continuous Infusions: . sodium chloride 75 mL/hr at 08/07/20 1247   PRN Meds: acetaminophen, bisacodyl, diphenhydrAMINE, HYDROcodone-acetaminophen, magnesium hydroxide, metoCLOPramide **OR** metoCLOPramide (REGLAN) injection, morphine injection, ondansetron **OR** ondansetron (ZOFRAN) IV, sodium phosphate, traMADol, zolpidem  Allergies:    Allergies  Allergen Reactions  . Levaquin [Levofloxacin In D5w] Other (See Comments)    Severe dizziness   . Metoprolol Itching, Nausea And  Vomiting, Rash and Other (See Comments)    Dizziness  . Tikosyn [Dofetilide] Other (See Comments)    Developed Torsades on Tikosyn 09/28/17  . Ace Inhibitors Cough  . Adhesive [Tape] Rash    Social History:   Social History   Socioeconomic History  . Marital status: Married    Spouse name: Not on file  . Number of children: Not on file  . Years of education: Not on file  . Highest education level: Not on file  Occupational History  . Not on file  Tobacco Use  . Smoking status: Never Smoker  . Smokeless tobacco: Never Used  Vaping Use  . Vaping Use: Never used  Substance and Sexual Activity  . Alcohol use: Yes    Alcohol/week: 7.0 standard drinks    Types: 7 Glasses of wine per week    Comment: 1 glass of wine/day.  . Drug use: Never  . Sexual activity: Not Currently  Other Topics Concern  . Not on file  Social History Narrative   Lives in [place].  Works as Water quality scientist.    Social Determinants of Health   Financial Resource Strain: Not on file  Food Insecurity: Not on file  Transportation Needs: Not on file  Physical Activity: Not on file  Stress: Not on file  Social Connections: Not on file  Intimate Partner  Violence: Not on file    Family History:    Family History  Problem Relation Age of Onset  . CAD Father        s/p cabg - ? h/o afib.  Marland Kitchen Heart attack Father   . Hypertension Mother   . Breast cancer Mother 48     ROS:  Please see the history of present illness.   All other ROS reviewed and negative.     Physical Exam/Data:   Vitals:   08/10/20 0451 08/10/20 0808 08/10/20 0850 08/10/20 0942  BP: 130/87 (!) 147/123 140/90 106/81  Pulse: (!) 102 (!) 137 (!) 120 (!) 133  Resp: 20 18 18    Temp: 98.6 F (37 C) 98.8 F (37.1 C) 98.8 F (37.1 C) 98.4 F (36.9 C)  TempSrc: Oral     SpO2: 92% 96% 92% 91%  Weight:      Height:        Intake/Output Summary (Last 24 hours) at 08/10/2020 1031 Last data filed at 08/10/2020 0956 Gross per 24 hour  Intake 360 ml  Output --  Net 360 ml   Last 3 Weights 08/07/2020 07/29/2020 07/28/2020  Weight (lbs) 240 lb 240 lb 240 lb  Weight (kg) 108.863 kg 108.863 kg 108.863 kg     Body mass index is 42.51 kg/m.  General:  Well nourished, well developed, in no acute distress HEENT: normal Lymph: no adenopathy Neck: no JVD Endocrine:  No thryomegaly Vascular: No carotid bruits; FA pulses 2+ bilaterally without bruits  Cardiac:  normal S1, S2; IRRR tachy; no murmur  Lungs:  clear to auscultation bilaterally, no wheezing, rhonchi or rales  Abd: soft, nontender, no hepatomegaly  Ext: no edema Musculoskeletal:  No deformities, BUE and BLE strength normal and equal Skin: warm and dry  Neuro:  CNs 2-12 intact, no focal abnormalities noted Psych:  Normal affect   EKG:  The EKG was personally reviewed and demonstrates:  Atrial fib with an RVR Telemetry:  Telemetry was personally reviewed and demonstrates:  Atrial fib with a RVR  Relevant CV Studies: none  Laboratory Data:  High Sensitivity Troponin:  No results for input(s): TROPONINIHS in the last 720 hours.   Chemistry Recent Labs  Lab 08/08/20 0701 08/09/20 0539 08/10/20 0625  NA  139 137 137  K 4.4 3.7 3.9  CL 103 103 100  CO2 25 24 25   GLUCOSE 120* 123* 110*  BUN 32* 26* 25*  CREATININE 1.51* 1.20* 1.19*  CALCIUM 8.5* 8.5* 8.7*  GFRNONAA 37* 49* 50*  ANIONGAP 11 10 12     No results for input(s): PROT, ALBUMIN, AST, ALT, ALKPHOS, BILITOT in the last 168 hours. Hematology Recent Labs  Lab 08/08/20 0701 08/09/20 0539  WBC 7.7 9.8  RBC 3.29* 3.23*  HGB 9.7* 9.7*  HCT 30.3* 29.5*  MCV 92.1 91.3  MCH 29.5 30.0  MCHC 32.0 32.9  RDW 15.9* 16.3*  PLT 200 208   BNPNo results for input(s): BNP, PROBNP in the last 168 hours.  DDimer No results for input(s): DDIMER in the last 168 hours.   Radiology/Studies:  DG Knee Left Port  Result Date: 08/07/2020 CLINICAL DATA:  Post total knee replacement LEFT EXAM: PORTABLE LEFT KNEE - 1-2 VIEW COMPARISON:  Portable exam 1017 hours without priors for comparison FINDINGS: Mild osseous demineralization. Components of a LEFT knee prosthesis are identified in expected position. No acute fracture, dislocation, or bone destruction. Anterior skin clips and postsurgical soft tissue changes. IMPRESSION: LEFT knee prosthesis without acute abnormalities. Electronically Signed   By: 08/11/20 M.D.   On: 08/07/2020 11:25     Assessment and Plan:   1. Recurrent atrial fib - she was in rhythm on 4/5 and out of rhythm on 4/14 and today. Her rates are up. She is comfortable. I will order IV verapamil (allergic to lopressor). If her rates come down under 125/min, she can be discharged home whenever appropriate from surgical perspective. Continue all home meds.  2. Chronic systolic and diastolic heart failure - most recently her EF has been normal. She is on maximal guideline directed medical therapy. She is not clinically volume overloaded. I would not recommend IV lasix at this time.  CHA2DS2-VASc Score =   4 4This indicates a  % annual risk of stroke. The patient's score is based upon:     For questions or updates, please contact  [facility] Please consult [email or web address] for contact info under    Signed, 6/5, MD  08/10/2020 10:31 AM

## 2020-08-10 NOTE — Progress Notes (Signed)
Dr. Ladona Ridgel at bedside to assess pt.

## 2020-08-10 NOTE — Progress Notes (Signed)
   08/10/20 0808  Assess: MEWS Score  Temp 98.8 F (37.1 C)  BP (!) 147/123  Pulse Rate (!) 137  Resp 18  SpO2 96 %  O2 Device Room Air  Patient Activity (if Appropriate) In chair  Assess: MEWS Score  MEWS Temp 0  MEWS Systolic 0  MEWS Pulse 3  MEWS RR 0  MEWS LOC 0  MEWS Score 3  MEWS Score Color Yellow  Assess: if the MEWS score is Yellow or Red  Were vital signs taken at a resting state? Yes  Focused Assessment No change from prior assessment  Early Detection of Sepsis Score *See Row Information* Low  MEWS guidelines implemented *See Row Information* Yes  Treat  MEWS Interventions Administered scheduled meds/treatments;Escalated (See documentation below)  Pain Scale 0-10  Pain Score 5  Pain Type Surgical pain  Pain Location Knee  Pain Orientation Left  Pain Descriptors / Indicators Aching  Pain Intervention(s) Medication (See eMAR)  Take Vital Signs  Increase Vital Sign Frequency  Yellow: Q 2hr X 2 then Q 4hr X 2, if remains yellow, continue Q 4hrs  Escalate  MEWS: Escalate Yellow: discuss with charge nurse/RN and consider discussing with provider and RRT  Notify: Charge Nurse/RN  Name of Charge Nurse/RN Notified [place], RN  Date Charge Nurse/RN Notified 08/10/20  Time Charge Nurse/RN Notified 0815  Notify: Provider  Provider Name/Title Leron Croak, MD  Date Provider Notified 08/10/20  Time Provider Notified [phone]  Notification Type Page  Notification Reason Change in status  Provider response See new orders  Date of Provider Response 08/10/20  Time of Provider Response [phone]  Document  Patient Outcome Not stable and remains on department  Progress note created (see row info) Yes

## 2020-08-11 DIAGNOSIS — I5022 Chronic systolic (congestive) heart failure: Secondary | ICD-10-CM

## 2020-08-11 LAB — SARS CORONAVIRUS 2 (TAT 6-24 HRS): SARS Coronavirus 2: NEGATIVE

## 2020-08-11 MED ORDER — CARVEDILOL 3.125 MG PO TABS
6.2500 mg | ORAL_TABLET | Freq: Two times a day (BID) | ORAL | Status: DC
  Administered 2020-08-11 – 2020-08-12 (×2): 6.25 mg via ORAL
  Filled 2020-08-11 (×2): qty 2

## 2020-08-11 MED ORDER — CARVEDILOL 3.125 MG PO TABS
3.1250 mg | ORAL_TABLET | Freq: Once | ORAL | Status: AC
  Administered 2020-08-11: 3.125 mg via ORAL
  Filled 2020-08-11: qty 1

## 2020-08-11 MED ORDER — SACUBITRIL-VALSARTAN 24-26 MG PO TABS
1.0000 | ORAL_TABLET | Freq: Two times a day (BID) | ORAL | Status: DC
  Administered 2020-08-11 – 2020-08-13 (×4): 1 via ORAL
  Filled 2020-08-11 (×5): qty 1

## 2020-08-11 NOTE — Plan of Care (Incomplete)
Patient had an uneventful shift. No changes in neurological and neurovascular assessments. Pain controlled with PRN medications. Cardiology following for Afib with .Denies any needs at this time. All Safety measures maintained. Care continues.  Problem: Education: Goal: Knowledge of the prescribed therapeutic regimen will improve Outcome: Progressing Goal: Individualized Educational Video(s) Outcome: Progressing   Problem: Activity: Goal: Ability to avoid complications of mobility impairment will improve Outcome: Progressing Goal: Range of joint motion will improve Outcome: Progressing   Problem: Clinical Measurements: Goal: Postoperative complications will be avoided or minimized Outcome: Progressing   Problem: Pain Management: Goal: Pain level will decrease with appropriate interventions Outcome: Progressing   Problem: Skin Integrity: Goal: Will show signs of wound healing Outcome: Progressing

## 2020-08-11 NOTE — Progress Notes (Signed)
Physical Therapy Treatment Patient Details Name: Stacey [name] MRN: [id number] DOB: Sep 11, 1950 Today's Date: 08/11/2020    History of Present Illness Patient s/p L TKA, WBAT. PMH of HTN, HLD, GERD, hypothyroidism, anxiety, PAF, morbid obesity, diastolic HF    PT Comments    Pt alert, agreeable to PT, had just returned to bed from recliner and declined OOB mobility during PT session. HR also monitored throughout continuously, ranged from 103-137 BPM, elevated with exertion during supine exercises such as heel slides, SAQ. She was able to perform several supine exercises with verbal and tactile cues. Repositioned and all needs in reach at end of session. The patient would benefit from further skilled PT intervention to continue to progress towards goals. Recommendation remains appropriate.     Follow Up Recommendations  SNF     Equipment Recommendations  3in1 (PT)    Recommendations for Other Services       Precautions / Restrictions Precautions Precautions: Fall;Knee Precaution Booklet Issued: Yes (comment) Restrictions Weight Bearing Restrictions: Yes LLE Weight Bearing: Weight bearing as tolerated    Mobility  Bed Mobility               General bed mobility comments: pt back in bed from recliner very recently prior to PT entering room, declined further mobility at this time    Transfers                    Ambulation/Gait                 Stairs             Wheelchair Mobility    Modified Rankin (Stroke Patients Only)       Balance                                            Cognition Arousal/Alertness: Awake/alert Behavior During Therapy: WFL for tasks assessed/performed Overall Cognitive Status: Within Functional Limits for tasks assessed                                        Exercises Total Joint Exercises Ankle Circles/Pumps: AROM;Both;20 reps Quad Sets: AROM;Strengthening;Left;20  reps Short Arc Quad: AROM;Strengthening;Left;20 reps Heel Slides: Left;AAROM;20 reps Hip ABduction/ADduction: AAROM;Strengthening;Left;20 reps    General Comments        Pertinent Vitals/Pain Pain Assessment: 0-10 Pain Score: 5  Faces Pain Scale: Hurts little more Pain Location: L knee with knee flexion Pain Descriptors / Indicators: Aching;Sore Pain Intervention(s): Limited activity within patient's tolerance;Monitored during session;Repositioned    Home Living                      Prior Function            PT Goals (current goals can now be found in the care plan section) Progress towards PT goals: Progressing toward goals    Frequency    BID      PT Plan Current plan remains appropriate    Co-evaluation              AM-PAC PT "6 Clicks" Mobility   Outcome Measure  Help needed turning from your back to your side while in a flat bed without using bedrails?: A Little Help needed moving from lying  on your back to sitting on the side of a flat bed without using bedrails?: A Lot Help needed moving to and from a bed to a chair (including a wheelchair)?: A Little Help needed standing up from a chair using your arms (e.g., wheelchair or bedside chair)?: A Little Help needed to walk in hospital room?: A Lot Help needed climbing 3-5 steps with a railing? : A Lot 6 Click Score: 15    End of Session Equipment Utilized During Treatment: Gait belt Activity Tolerance: Treatment limited secondary to medical complications (Comment) Patient left: in chair;with call bell/phone within reach Nurse Communication: Other (comment) (HR) PT Visit Diagnosis: Other abnormalities of gait and mobility (R26.89);Difficulty in walking, not elsewhere classified (R26.2);Pain;Muscle weakness (generalized) (M62.81) Pain - Right/Left: Left Pain - part of body: Knee     Time: 7322-0254 PT Time Calculation (min) (ACUTE ONLY): 13 min  Charges:  $Therapeutic Exercise: 8-22  mins                     Olga Coaster PT, DPT 12:07 PM,08/11/20

## 2020-08-11 NOTE — Progress Notes (Signed)
Occupational Therapy Treatment Patient Details Name: Stacey [name] MRN: [id number] DOB: 07/05/1950 Today's Date: 08/11/2020    History of present illness Patient s/p L TKA, WBAT. PMH of HTN, HLD, GERD, hypothyroidism, anxiety, PAF, morbid obesity, diastolic HF   OT comments  Pt seen for OT tx this date, spouse present. Pt had no bone foam or rolled towel under surgical leg. Pt/spouse educated in knee positioning and importance of optimizing passive knee extension to support healing. Rolled towel placed under ankle as pt reports unable to tolerate bone foam. Pt/spouse endorsed poor recall of previous edu/training from OT at time of evaluation. Pt/spouse instructed in compression stocking mgt, polar care mgt, falls, AE/DME, and bathroom set up and equipment recommendations to maximize safety/indep. Pt's spouse able to return demo compression stocking mgt with cues for technique. Pt/spouse verbalized understanding of training provided. Pt continues to benefit from skilled OT services. Continue to recommend SNF for short term rehab prior to return home.    Follow Up Recommendations  SNF    Equipment Recommendations  3 in 1 bedside commode    Recommendations for Other Services      Precautions / Restrictions Precautions Precautions: Fall;Knee Restrictions Weight Bearing Restrictions: Yes LLE Weight Bearing: Weight bearing as tolerated       Mobility Bed Mobility                    Transfers                      Balance                                           ADL either performed or assessed with clinical judgement   ADL Overall ADL's : Needs assistance/impaired                                       General ADL Comments: Pt continues to require MIN-MOD A for LB ADL Tasks     Vision Patient Visual Report: No change from baseline     Perception     Praxis      Cognition Arousal/Alertness: Awake/alert Behavior  During Therapy: WFL for tasks assessed/performed Overall Cognitive Status: Within Functional Limits for tasks assessed                                          Exercises Other Exercises Other Exercises: Pt/spouse instructed in compressino stocking mgt, polar care mgt, falls, AE/DME, and bathroom set up and equipment recommendations to maximize safety/indep. Pt's spouse able to return demo compression stocking mgt with cues for technique   Shoulder Instructions       General Comments      Pertinent Vitals/ Pain       Pain Assessment: 0-10 Pain Score: 5  Pain Location: L knee with knee flexion Pain Descriptors / Indicators: Aching;Sore Pain Intervention(s): Limited activity within patient's tolerance;Monitored during session;Premedicated before session;Repositioned;Ice applied  Home Living  Prior Functioning/Environment              Frequency  Min 1X/week        Progress Toward Goals  OT Goals(current goals can now be found in the care plan section)  Progress towards OT goals: Progressing toward goals  Acute Rehab OT Goals Patient Stated Goal: Rehab then home OT Goal Formulation: With patient Time For Goal Achievement: 08/22/20 Potential to Achieve Goals: Good  Plan Discharge plan remains appropriate;Frequency remains appropriate    Co-evaluation                 AM-PAC OT "6 Clicks" Daily Activity     Outcome Measure   Help from another person eating meals?: None Help from another person taking care of personal grooming?: A Little Help from another person toileting, which includes using toliet, bedpan, or urinal?: A Little Help from another person bathing (including washing, rinsing, drying)?: A Lot Help from another person to put on and taking off regular upper body clothing?: A Little Help from another person to put on and taking off regular lower body clothing?: A Lot 6  Click Score: 17    End of Session    OT Visit Diagnosis: Unsteadiness on feet (R26.81);Muscle weakness (generalized) (M62.81)   Activity Tolerance Patient tolerated treatment well   Patient Left in bed;with call bell/phone within reach;with bed alarm set;with family/visitor present;Other (comment) (polar care, stockings in place, rolled towel under ankle)   Nurse Communication          Time: 7672-0947 OT Time Calculation (min): 39 min  Charges: OT General Charges $OT Visit: 1 Visit OT Treatments $Self Care/Home Management : 38-52 mins  Wynona Canes, MPH, MS, OTR/L ascom [phone] 08/11/20, 4:42 PM

## 2020-08-11 NOTE — Progress Notes (Signed)
  Subjective: 4 Days Post-Op Procedure(s) (LRB): TOTAL KNEE ARTHROPLASTY (Left) Patient reports pain as well-controlled.   Denies any significant leg pain. Cardiology consulted yesterday, IV verapamil order, HR 103 this AM. Plan is to go Skilled nursing facility after hospital stay. Negative for chest pain and shortness of breath Fever: no Gastrointestinal: negative for nausea and vomiting.   Patient has had a bowel movement.  Objective: Vital signs in last 24 hours: Temp:  [97.4 F (36.3 C)-98.9 F (37.2 C)] 97.4 F (36.3 C) (04/18 0443) Pulse Rate:  [103-137] 103 (04/18 0443) Resp:  [14-20] 20 (04/18 0443) BP: (106-150)/(72-123) 145/104 (04/18 0443) SpO2:  [91 %-96 %] 95 % (04/18 0443)  Intake/Output from previous day:  Intake/Output Summary (Last 24 hours) at 08/11/2020 0758 Last data filed at 08/10/2020 1351 Gross per 24 hour  Intake 180 ml  Output --  Net 180 ml    Intake/Output this shift: No intake/output data recorded.  Labs: Recent Labs    08/09/20 0539 08/10/20 1200  HGB 9.7* 9.7*   Recent Labs    08/09/20 0539 08/10/20 1200  WBC 9.8 10.1  RBC 3.23* 3.28*  HCT 29.5* 30.8*  PLT 208 214   Recent Labs    08/09/20 0539 08/10/20 0625  NA 137 137  K 3.7 3.9  CL 103 100  CO2 24 25  BUN 26* 25*  CREATININE 1.20* 1.19*  GLUCOSE 123* 110*  CALCIUM 8.5* 8.7*   No results for input(s): LABPT, INR in the last 72 hours.   EXAM General - Patient is Alert, Appropriate and Oriented  Extremity - Neurovascular intact Dorsiflexion/Plantar flexion intact Compartment soft Dressing/Incision -Polar Care in place and working. , Hemovac in place. Minimal drainage to the left knee. Motor Function - intact, moving foot and toes well on exam. Cardiovascular- Tachycardic, no R/M/G.   Respiratory- Lungs clear to auscultation bilaterally Gastrointestinal- soft, nontender and active bowel sounds  Assessment/Plan: 4 Days Post-Op Procedure(s) (LRB): TOTAL KNEE  ARTHROPLASTY (Left) Active Problems:   Status post total knee replacement using cement, left  Estimated body mass index is 42.51 kg/m as calculated from the following:   Height as of this encounter: 5\' 3"  (1.6 m).   Weight as of this encounter: 108.9 kg. Advance diet Up with therapy (after verapamil, per cards)  HR improved after verapamil, HR 103 this AM.  Appreciate input from Cards. Up with therapy today. Patient has had a BM. Plan for possible d/c to SNF today, will await cardiology input to ensure patient is safe for d/c.  DVT Prophylaxis - Eloquis, Ted hose and foot pumps Weight-Bearing as tolerated to left leg  J. , PA-C [facility] Orthopaedic [facility] 08/11/2020, 7:58 AM

## 2020-08-11 NOTE — Progress Notes (Signed)
Progress Note  Patient Name: Stacey [name] Date of Encounter: 08/11/2020  Primary Cardiologist: Kirke Corin  Subjective   She remains in Afib with RVR with ventricular rates in the 110s to 130s mostly, occasionally in the 140s to 150s bpm. No chest pain, dyspnea, palpitations, dizziness, presyncope, or syncope.   Inpatient Medications    Scheduled Meds: . apixaban  5 mg Oral BID  . carvedilol  3.125 mg Oral Once  . carvedilol  6.25 mg Oral BID  . docusate sodium  100 mg Oral BID  . furosemide  20 mg Oral Daily  . levothyroxine  100 mcg Oral QAC breakfast  . magnesium oxide  400 mg Oral Daily  . pantoprazole  40 mg Oral Daily  . rosuvastatin  10 mg Oral QHS  . sacubitril-valsartan  1 tablet Oral BID  . venlafaxine  37.5 mg Oral BID   Continuous Infusions: . sodium chloride 75 mL/hr at 08/07/20 1247   PRN Meds: acetaminophen, bisacodyl, diphenhydrAMINE, HYDROcodone-acetaminophen, magnesium hydroxide, metoCLOPramide **OR** metoCLOPramide (REGLAN) injection, morphine injection, ondansetron **OR** ondansetron (ZOFRAN) IV, sodium phosphate, traMADol, zolpidem   Vital Signs    Vitals:   08/10/20 1644 08/10/20 2009 08/11/20 0443 08/11/20 0829  BP: (!) 150/101 139/86 (!) 145/104 128/90  Pulse: (!) 107 (!) 118 (!) 103 (!) 127  Resp: 18 19 20 18   Temp: 98.7 F (37.1 C) 98.2 F (36.8 C) (!) 97.4 F (36.3 C) 98 F (36.7 C)  TempSrc: Oral Oral Oral   SpO2: 93% 94% 95% 94%  Weight:      Height:        Intake/Output Summary (Last 24 hours) at 08/11/2020 1224 Last data filed at 08/11/2020 1019 Gross per 24 hour  Intake 180 ml  Output --  Net 180 ml   Filed Weights   08/07/20 0623  Weight: 108.9 kg    Telemetry    Afib with RVR with ventricular rates mostly in the 110s to 130s bpm, occasionally in the 140s to 150s bpm - Personally Reviewed  ECG    08/10/2020: A. fib with RVR, 133 bpm, low voltage QRS, nonspecific ST-T changes - Personally Reviewed  Physical Exam    GEN: No acute distress.   Neck: No JVD. Cardiac: Tachycardic, IRIR, I/VI systolic murmur RUSB, no rubs, or gallops.  Respiratory: Clear to auscultation bilaterally.  GI: Soft, nontender, non-distended.   MS: Trace bilateral ankle edema; No deformity. Neuro:  Alert and oriented x 3; Nonfocal.  Psych: Normal affect.  Labs    Chemistry Recent Labs  Lab 08/08/20 0701 08/09/20 0539 08/10/20 0625  NA 139 137 137  K 4.4 3.7 3.9  CL 103 103 100  CO2 25 24 25   GLUCOSE 120* 123* 110*  BUN 32* 26* 25*  CREATININE 1.51* 1.20* 1.19*  CALCIUM 8.5* 8.5* 8.7*  GFRNONAA 37* 49* 50*  ANIONGAP 11 10 12      Hematology Recent Labs  Lab 08/08/20 0701 08/09/20 0539 08/10/20 1200  WBC 7.7 9.8 10.1  RBC 3.29* 3.23* 3.28*  HGB 9.7* 9.7* 9.7*  HCT 30.3* 29.5* 30.8*  MCV 92.1 91.3 93.9  MCH 29.5 30.0 29.6  MCHC 32.0 32.9 31.5  RDW 15.9* 16.3* 16.5*  PLT 200 208 214    Cardiac EnzymesNo results for input(s): TROPONINI in the last 168 hours. No results for input(s): TROPIPOC in the last 168 hours.   BNPNo results for input(s): BNP, PROBNP in the last 168 hours.   DDimer No results for input(s): DDIMER in  the last 168 hours.   Radiology    No results found.  Cardiac Studies   2D echo 10/11/2019: 1. Left ventricular ejection fraction, by estimation, is 55 to 60%. The  left ventricle has normal function. The left ventricle has no regional  wall motion abnormalities. Left ventricular diastolic parameters were  normal.  2. Right ventricular systolic function is normal. The right ventricular  size is normal. Tricuspid regurgitation signal is inadequate for assessing  PA pressure.  3. Challenging image quality   Patient Profile     70 y.o. 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 fib on Eliquis having failed multiple antiarrhythmic medications including Tikosyn which was associated with QT prolongation, as well as flecainide status post  multiple DCCV's, chronic combined systolic and diastolic CHF, CKD stage III, hypothyroidism, HTN, and OSA on CPAP who we are seeing for A. fib with RVR.  Assessment & Plan    1.  Persistent A. fib with RVR: -Sinus rhythm noted on 07/28/2020 with EKG on 4/14 demonstrating A. Fib -Anticoagulation has been interrupted for left TKA which occurred on 08/07/2020 and was resumed on 08/08/2020 -Ideally, we would transition her to metoprolol from carvedilol for added ventricular rate control.  However, she has a documented allergy/intolerance to metoprolol -Titrate carvedilol to 6.25 mg twice daily, if added BP room is needed for further rate control we could decrease her dose of Entresto temporarily -Given interruption of anticoagulation in the perioperative setting, amiodarone use is not ideal, therefore this will be discontinued for now -If her ventricular rates continue to be difficult to control she will require TEE guided DCCV prior to discharge, otherwise would look to DCCV in 3-4 weeks after she has been adequately anticoagulated -This episode of Afib is likely in the context of her surgery, should she have recurrent Afib, following restoration of sinus, down the road, would have her follow up with EP/Afib clinic  -Recent TSH normal -CHA2DS2-VASc 5 (CHF, HTN, age x 1, vascular disease, sex category) -Eliquis  2.  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 fib with RVR with EF improving following restoration of sinus rhythm -Most recent echo from 09/2019 demonstrated a preserved LVSF with an EF of 55 to 60% -Euvolemic  -She remains on carvedilol, Entresto and low-dose Lasix  3.  Nonobstructive CAD: -No symptoms concerning for angina -On Eliquis in place of aspirin to minimize bleeding risk -No plans for inpatient ischemic evaluation -PTA Crestor  4.  CKD stage III: -Renal function stable  5.   HTN: -Blood pressure has been on the higher side -Continue medical therapy as outlined above  6.  Hypothyroidism: -TSH from 05/2020 normal -She remains on replacement therapy with levothyroxine   For questions or updates, please contact [facility] Please consult [email or web address] for contact info under Cardiology/STEMI.    Signed, Eula Listen, PA-C [facility] Pager: [phone] 08/11/2020, 12:24 PM

## 2020-08-11 NOTE — Care Management Important Message (Signed)
Important Message  Patient Details  Name: Stacey [name] MRN: [id number] Date of Birth: 02-02-1951   Medicare Important Message Given:  Yes     Olegario Messier A Leean Amezcua 08/11/2020, 2:39 PM

## 2020-08-11 NOTE — Plan of Care (Signed)
Patient had an uneventful shift. No changes in neurological and neurovascular assessments. Pain controlled with PRN medications. Cardiology following for Afib with elevated heart rates.Denies any needs at this time. All Safety measures maintained. Care continues.  Problem: Education: Goal: Knowledge of the prescribed therapeutic regimen will improve 08/11/2020 0744 by Maximino Sarin, RN Outcome: Progressing 08/11/2020 0741 by Maximino Sarin, RN Outcome: Progressing Goal: Individualized Educational Video(s) 08/11/2020 0744 by Maximino Sarin, RN Outcome: Progressing 08/11/2020 0741 by Maximino Sarin, RN Outcome: Progressing   Problem: Activity: Goal: Ability to avoid complications of mobility impairment will improve 08/11/2020 0744 by Maximino Sarin, RN Outcome: Progressing 08/11/2020 0741 by Maximino Sarin, RN Outcome: Progressing Goal: Range of joint motion will improve 08/11/2020 0744 by Maximino Sarin, RN Outcome: Progressing 08/11/2020 0741 by Maximino Sarin, RN Outcome: Progressing   Problem: Clinical Measurements: Goal: Postoperative complications will be avoided or minimized 08/11/2020 0744 by Maximino Sarin, RN Outcome: Progressing 08/11/2020 0741 by Maximino Sarin, RN Outcome: Progressing   Problem: Pain Management: Goal: Pain level will decrease with appropriate interventions 08/11/2020 0744 by Maximino Sarin, RN Outcome: Progressing 08/11/2020 0741 by Maximino Sarin, RN Outcome: Progressing   Problem: Skin Integrity: Goal: Will show signs of wound healing 08/11/2020 0744 by Maximino Sarin, RN Outcome: Progressing 08/11/2020 0741 by Maximino Sarin, RN Outcome: Progressing

## 2020-08-12 MED ORDER — CARVEDILOL 12.5 MG PO TABS
12.5000 mg | ORAL_TABLET | Freq: Two times a day (BID) | ORAL | Status: DC
  Administered 2020-08-12: 12.5 mg via ORAL
  Filled 2020-08-12: qty 1

## 2020-08-12 MED ORDER — PNEUMOCOCCAL VAC POLYVALENT 25 MCG/0.5ML IJ INJ: 0.5000 mL | INJECTION | INTRAMUSCULAR | Status: DC | PRN

## 2020-08-12 MED ORDER — CARVEDILOL 3.125 MG PO TABS
6.2500 mg | ORAL_TABLET | Freq: Once | ORAL | Status: AC
  Administered 2020-08-12: 6.25 mg via ORAL
  Filled 2020-08-12: qty 2

## 2020-08-12 NOTE — Progress Notes (Signed)
Physical Therapy Treatment Patient Details Name: Stacey [name] MRN: [id number] DOB: [birth year] Today's Date: 08/12/2020    History of Present Illness Patient s/p L TKA, WBAT. PMH of HTN, HLD, GERD, hypothyroidism, anxiety, PAF, morbid obesity, diastolic HF    PT Comments    Pt was long sitting in bed upon arriving. She agrees to session and is cooperative and pleasant throughout. HR at rest ~ 108. Hr elevated to 138 at peak during ambulation. BP 126/86 in sitting prior. Pt continues to require min assist for bed mobility and CGA for safety. She lives alone and Thereasa Parkin feels will greatly benefit from SNF to address deficits while maximizing independence with ADLs. Pt would not allow therapist to re-apply polar care due to " I can't tolerate how cold it is." Acute PT will continue to follow and progress as able per POC.    Follow Up Recommendations  SNF (pt does not have assistance at home)     Equipment Recommendations  3in1 (PT)       Precautions / Restrictions Precautions Precautions: Fall;Knee Precaution Booklet Issued: Yes (comment) Restrictions Weight Bearing Restrictions: Yes LLE Weight Bearing: Weight bearing as tolerated    Mobility  Bed Mobility Overal bed mobility: Needs Assistance Bed Mobility: Supine to Sit     Supine to sit: HOB elevated;Min assist Sit to supine: Min assist   General bed mobility comments: Pt continues to require min assist to exit bed. HR at EOB ~ 110 bpm but pt is assymtomatic    Transfers Overall transfer level: Needs assistance Equipment used: Rolling walker (2 wheeled) Transfers: Sit to/from Stand Sit to Stand: Min guard         General transfer comment: Pt was easily able to stand from lowest bed height. Vcs only for improved technique  Ambulation/Gait Ambulation/Gait assistance: Min guard Gait Distance (Feet): 40 Feet Assistive device: Rolling walker (2 wheeled) Gait Pattern/deviations: Step-to pattern;Antalgic Gait  velocity: decreased   General Gait Details: pt was easily able to ambulate 40 ft with CGA for safety. HR elevated to 138 at its peak. Pt continues to report no symptoms. BP prior to standing and ambulating 126/86      Balance Overall balance assessment: Needs assistance Sitting-balance support: Feet supported Sitting balance-Leahy Scale: Good Sitting balance - Comments: no LOB seated EOB   Standing balance support: Bilateral upper extremity supported;During functional activity Standing balance-Leahy Scale: Fair Standing balance comment: reliant on BUE support         Cognition Arousal/Alertness: Awake/alert Behavior During Therapy: WFL for tasks assessed/performed Overall Cognitive Status: Within Functional Limits for tasks assessed        General Comments: Pt is A and O x 4. cooperative and pleasant      Exercises Total Joint Exercises Goniometric ROM: 82 degrees flexion after stretching    General Comments General comments (skin integrity, edema, etc.): pt reports she continues to perfornm HEP. "I haven't been able to tolerate polar care because its too cold, even with something under it.      Pertinent Vitals/Pain Pain Assessment: No/denies pain Pain Score: 0-No pain           PT Goals (current goals can now be found in the care plan section) Acute Rehab PT Goals Patient Stated Goal: Rehab then home Progress towards PT goals: Progressing toward goals    Frequency    BID      PT Plan Current plan remains appropriate       AM-PAC PT "6 Clicks" Mobility  Outcome Measure  Help needed turning from your back to your side while in a flat bed without using bedrails?: A Little Help needed moving from lying on your back to sitting on the side of a flat bed without using bedrails?: A Little Help needed moving to and from a bed to a chair (including a wheelchair)?: A Little Help needed standing up from a chair using your arms (e.g., wheelchair or bedside  chair)?: A Little Help needed to walk in hospital room?: A Little Help needed climbing 3-5 steps with a railing? : A Lot 6 Click Score: 17    End of Session Equipment Utilized During Treatment: Gait belt Activity Tolerance: Patient tolerated treatment well Patient left: in bed;with call bell/phone within reach;with bed alarm set Nurse Communication: Mobility status PT Visit Diagnosis: Other abnormalities of gait and mobility (R26.89);Difficulty in walking, not elsewhere classified (R26.2);Pain;Muscle weakness (generalized) (M62.81) Pain - Right/Left: Left Pain - part of body: Knee     Time: 1320-1350 PT Time Calculation (min) (ACUTE ONLY): 30 min  Charges:  $Gait Training: 8-22 mins $Therapeutic Exercise: 8-22 mins                     Jetta Lout PTA 08/12/20, 3:28 PM

## 2020-08-12 NOTE — Progress Notes (Signed)
Progress Note  Patient Name: Stacey [name] Date of Encounter: 08/12/2020  Primary Cardiologist: Kirke Corin  Subjective   No chest pain, dyspnea, palpitations, dizziness, presyncope, or syncope. She remains in Afib with ventricular rates in the low 100s bpm at rest and trending into the 130s to 150s bpm with movement. She has tolerated the titration of Coreg without issues.   Inpatient Medications    Scheduled Meds: . apixaban  5 mg Oral BID  . carvedilol  12.5 mg Oral BID  . docusate sodium  100 mg Oral BID  . furosemide  20 mg Oral Daily  . levothyroxine  100 mcg Oral QAC breakfast  . magnesium oxide  400 mg Oral Daily  . pantoprazole  40 mg Oral Daily  . rosuvastatin  10 mg Oral QHS  . sacubitril-valsartan  1 tablet Oral BID  . venlafaxine  37.5 mg Oral BID   Continuous Infusions: . sodium chloride 75 mL/hr at 08/07/20 1247   PRN Meds: acetaminophen, bisacodyl, diphenhydrAMINE, HYDROcodone-acetaminophen, magnesium hydroxide, metoCLOPramide **OR** metoCLOPramide (REGLAN) injection, morphine injection, ondansetron **OR** ondansetron (ZOFRAN) IV, pneumococcal 23 valent vaccine, sodium phosphate, traMADol, zolpidem   Vital Signs    Vitals:   08/11/20 2010 08/12/20 0015 08/12/20 0512 08/12/20 0846  BP: 133/78 124/87 132/67 115/65  Pulse: (!) 123 (!) 117 (!) 109 (!) 113  Resp: 18 18 18 18   Temp: 97.7 F (36.5 C) 98.6 F (37 C) 97.6 F (36.4 C) 98.2 F (36.8 C)  TempSrc:   Oral Oral  SpO2: 95% 96% 100% 93%  Weight:      Height:        Intake/Output Summary (Last 24 hours) at 08/12/2020 1331 Last data filed at 08/11/2020 1923 Gross per 24 hour  Intake 600 ml  Output --  Net 600 ml   Filed Weights   08/07/20 0623  Weight: 108.9 kg    Telemetry    Afib with RVR with ventricular rates in the low 100s at rest/overnight, trending into the 130s to 150s bpm - Personally Reviewed  ECG    No new tracings - Personally Reviewed  Physical Exam   GEN: No acute  distress.   Neck: No JVD. Cardiac: Tachycardic, IRIR, no murmurs, rubs, or gallops.  Respiratory: Clear to auscultation bilaterally.  GI: Soft, nontender, non-distended.   MS: Trace bilateral pretibial edema; No deformity. Neuro:  Alert and oriented x 3; Nonfocal.  Psych: Normal affect.  Labs    Chemistry Recent Labs  Lab 08/08/20 0701 08/09/20 0539 08/10/20 0625  NA 139 137 137  K 4.4 3.7 3.9  CL 103 103 100  CO2 25 24 25   GLUCOSE 120* 123* 110*  BUN 32* 26* 25*  CREATININE 1.51* 1.20* 1.19*  CALCIUM 8.5* 8.5* 8.7*  GFRNONAA 37* 49* 50*  ANIONGAP 11 10 12      Hematology Recent Labs  Lab 08/08/20 0701 08/09/20 0539 08/10/20 1200  WBC 7.7 9.8 10.1  RBC 3.29* 3.23* 3.28*  HGB 9.7* 9.7* 9.7*  HCT 30.3* 29.5* 30.8*  MCV 92.1 91.3 93.9  MCH 29.5 30.0 29.6  MCHC 32.0 32.9 31.5  RDW 15.9* 16.3* 16.5*  PLT 200 208 214    Cardiac EnzymesNo results for input(s): TROPONINI in the last 168 hours. No results for input(s): TROPIPOC in the last 168 hours.   BNPNo results for input(s): BNP, PROBNP in the last 168 hours.   DDimer No results for input(s): DDIMER in the last 168 hours.   Radiology  No results found.  Cardiac Studies   2D echo 10/11/2019: 1. Left ventricular ejection fraction, by estimation, is 55 to 60%. The  left ventricle has normal function. The left ventricle has no regional  wall motion abnormalities. Left ventricular diastolic parameters were  normal.  2. Right ventricular systolic function is normal. The right ventricular  size is normal. Tricuspid regurgitation signal is inadequate for assessing  PA pressure.  3. Challenging image quality   Patient Profile     70 y.o. 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 fib on Eliquis having failed multiple antiarrhythmic medications including Tikosyn which was associated with QT prolongation, as well as flecainide status post multiple DCCV's, chronic combined  systolic and diastolic CHF, CKD stage III, hypothyroidism, HTN, and OSA on CPAP who we are seeing for A. fib with RVR.  Assessment & Plan    1. Persistent A. fib with RVR: -Sinus rhythm noted on 07/28/2020 with EKG on 4/14 demonstrating A. Fib -Anticoagulation has been interrupted for left TKA which occurred on 08/07/2020 and was resumed on 08/08/2020 -Ideally, we would transition her to metoprolol from carvedilol for added ventricular rate control.  However, she has a documented allergy/intolerance to metoprolol -Her ventricular rates remain suboptimally controlled, particularly with movement -With her history of tachy-mediated cardiomyopathy, we should aim for improved rate control prior to discharge -Titrate carvedilol to 12.5 mg twice daily, we will give an additional 6.25 mg of Coreg now -Enstresto was decreased on 4/18 to allow for more BP room to titrate rate controlling medications  -We will reassess her ventricular rates in a couple hours -Given interruption of anticoagulation in the perioperative setting, amiodarone use is not ideal, therefore this was discontinued on 4/18 with recommend to reload with amiodarone after she has been on OAC without interruption for 3-4 weeks -If her ventricular rates continue to be difficult to control she may require TEE guided DCCV prior to discharge, otherwise would look to DCCV in 3-4 weeks after she has been adequately anticoagulated -This episode of Afib is likely in the context of her surgery, should she have recurrent Afib, following restoration of sinus, down the road, would have her follow up with EP/Afib clinic  -Recent TSH normal -CHA2DS2-VASc 5 (CHF, HTN, age x 1, vascular disease, sex category) -Eliquis  2.  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 fib with RVR with EF improving following restoration of sinus rhythm -Most recent echo from 09/2019  demonstrated a preserved LVSF with an EF of 55 to 60% -Euvolemic  -She remains on carvedilol, Entresto and low-dose Lasix -Will need to watch for volume overload if tachycardic rates persist   3.  Nonobstructive CAD: -No symptoms concerning for angina -On Eliquis in place of aspirin to minimize bleeding risk -No plans for inpatient ischemic evaluation -PTA Crestor  4.  CKD stage III: -Renal function stable on last check  5.  HTN: -Blood pressure well controlled  -Continue medical therapy as outlined above  6.  Hypothyroidism: -TSH from 05/2020 normal -She remains on replacement therapy with levothyroxine  For questions or updates, please contact [facility] Please consult [email or web address] for contact info under Cardiology/STEMI.    Signed, Eula Listen, PA-C [facility] Pager: [phone] 08/12/2020, 1:31 PM

## 2020-08-12 NOTE — Progress Notes (Signed)
    After receiving a 2nd dose of Coreg 6.25 mg early this afternoon, her ventricular rates are slowly improving. Now sustained in the 120s bpm with occasional episodes in the 110s bpm. She remains asymptomatic. Given persistent RVR, and in the context of her history of tachy-mediated cardiomyopathy, we recommend she remain overnight to receive higher-dose Coreg 12.5 mg this evening and again in the AM of 4/20. We are hopeful this titration will allow for adequate ventricular rate control for her to be able to be discharged 4/20.

## 2020-08-12 NOTE — Plan of Care (Signed)

## 2020-08-12 NOTE — Progress Notes (Signed)
PT Cancellation Note  Patient Details Name: Stacey [name] MRN: [id number] DOB: 1950-12-18   Cancelled Treatment:     PT attempt. 2nd attempt today. Pt politely requesting not to have PT until she talks to cardiology first. Acute PT will continue to follow and progress as able per POC. Will return this PM to treat.    Rushie Chestnut 08/12/2020, 11:07 AM

## 2020-08-12 NOTE — Progress Notes (Signed)
  Subjective: 5 Days Post-Op Procedure(s) (LRB): TOTAL KNEE ARTHROPLASTY (Left) Patient reports pain as well-controlled.   Denies any significant leg pain. Cardiology consulted for Afib with tachycardia, HR improved this AM. Plan is to go Skilled nursing facility after hospital stay. COVID test negative Negative for chest pain and shortness of breath Fever: no Gastrointestinal: negative for nausea and vomiting.   Patient has had a bowel movement.  Objective: Vital signs in last 24 hours: Temp:  [97.6 F (36.4 C)-98.7 F (37.1 C)] 98.2 F (36.8 C) (04/19 0846) Pulse Rate:  [109-124] 113 (04/19 0846) Resp:  [18] 18 (04/19 0846) BP: (115-133)/(65-90) 115/65 (04/19 0846) SpO2:  [92 %-100 %] 93 % (04/19 0846)  Intake/Output from previous day:  Intake/Output Summary (Last 24 hours) at 08/12/2020 1034 Last data filed at 08/11/2020 1923 Gross per 24 hour  Intake 600 ml  Output --  Net 600 ml    Intake/Output this shift: No intake/output data recorded.  Labs: Recent Labs    08/10/20 1200  HGB 9.7*   Recent Labs    08/10/20 1200  WBC 10.1  RBC 3.28*  HCT 30.8*  PLT 214   Recent Labs    08/10/20 0625  NA 137  K 3.9  CL 100  CO2 25  BUN 25*  CREATININE 1.19*  GLUCOSE 110*  CALCIUM 8.7*   No results for input(s): LABPT, INR in the last 72 hours.   EXAM General - Patient is Alert, Appropriate and Oriented  Extremity - Neurovascular intact Dorsiflexion/Plantar flexion intact Compartment soft Dressing/Incision -Polar Care in place and working. , Hemovac in place. Minimal drainage to the left knee. Motor Function - intact, moving foot and toes well on exam. Cardiovascular- Tachycardic, no R/M/G.  Irregular rhythm. Respiratory- Lungs clear to auscultation bilaterally Gastrointestinal- soft, nontender and active bowel sounds  Assessment/Plan: 5 Days Post-Op Procedure(s) (LRB): TOTAL KNEE ARTHROPLASTY (Left) Active Problems:   Status post total knee  replacement using cement, left  Estimated body mass index is 42.51 kg/m as calculated from the following:   Height as of this encounter: 5\' 3"  (1.6 m).   Weight as of this encounter: 108.9 kg. Advance diet Up with therapy (after verapamil, per cards)  HR improved this AM, HR 113 this AM.  Will await Cards input today prior to discharge. COVID test negative Up with therapy today. Patient has had a BM. Plan for possible d/c to SNF today.  DVT Prophylaxis - Ted hose and foot pumps and Eliquis Weight-Bearing as tolerated to left leg  J. , PA-C [facility] 08/12/2020, 10:34 AM

## 2020-08-13 DIAGNOSIS — I428 Other cardiomyopathies: Secondary | ICD-10-CM

## 2020-08-13 DIAGNOSIS — I4891 Unspecified atrial fibrillation: Secondary | ICD-10-CM

## 2020-08-13 MED ORDER — CARVEDILOL 25 MG PO TABS: 25.0000 mg | ORAL_TABLET | Freq: Two times a day (BID) | ORAL | 0 refills | Status: DC

## 2020-08-13 MED ORDER — SACUBITRIL-VALSARTAN 24-26 MG PO TABS: 1.0000 | ORAL_TABLET | Freq: Two times a day (BID) | ORAL | 0 refills | Status: DC

## 2020-08-13 MED ORDER — CARVEDILOL 25 MG PO TABS
25.0000 mg | ORAL_TABLET | Freq: Two times a day (BID) | ORAL | Status: DC
  Administered 2020-08-13: 25 mg via ORAL
  Filled 2020-08-13: qty 1

## 2020-08-13 NOTE — Progress Notes (Signed)
Progress Note  Patient Name: Stacey [name] Date of Encounter: 08/13/2020  Primary Cardiologist: Kirke Corin  Subjective   No chest pain, dyspnea, palpitations, dizziness, presyncope, or syncope. She remains in Afib with improving ventricular rates in the low 100s bpm predominantly, trending into the 120s with movement. She has tolerated the titration of Coreg without issues.   Inpatient Medications    Scheduled Meds: . apixaban  5 mg Oral BID  . carvedilol  12.5 mg Oral BID  . docusate sodium  100 mg Oral BID  . furosemide  20 mg Oral Daily  . levothyroxine  100 mcg Oral QAC breakfast  . magnesium oxide  400 mg Oral Daily  . pantoprazole  40 mg Oral Daily  . rosuvastatin  10 mg Oral QHS  . sacubitril-valsartan  1 tablet Oral BID  . venlafaxine  37.5 mg Oral BID   Continuous Infusions: . sodium chloride 75 mL/hr at 08/07/20 1247   PRN Meds: acetaminophen, bisacodyl, diphenhydrAMINE, HYDROcodone-acetaminophen, magnesium hydroxide, metoCLOPramide **OR** metoCLOPramide (REGLAN) injection, morphine injection, ondansetron **OR** ondansetron (ZOFRAN) IV, pneumococcal 23 valent vaccine, sodium phosphate, traMADol, zolpidem   Vital Signs    Vitals:   08/12/20 0846 08/12/20 1614 08/12/20 1954 08/13/20 0500  BP: 115/65 (!) 138/94 113/85 136/82  Pulse: (!) 113 (!) 112 (!) 106 (!) 103  Resp: 18 16 18 18   Temp: 98.2 F (36.8 C) 98.3 F (36.8 C) 98.2 F (36.8 C) 98.4 F (36.9 C)  TempSrc: Oral Oral Oral   SpO2: 93% 97% 96% 96%  Weight:      Height:        Intake/Output Summary (Last 24 hours) at 08/13/2020 0800 Last data filed at 08/12/2020 1338 Gross per 24 hour  Intake 120 ml  Output --  Net 120 ml   Filed Weights   08/07/20 0623  Weight: 108.9 kg    Telemetry    Afib with RVR with ventricular rates in the low 100s predominantly, trending into the 120s bpm - Personally Reviewed  ECG    No new tracings - Personally Reviewed  Physical Exam   GEN: No acute  distress.   Neck: No JVD. Cardiac: IRIR with improved ventricular rates, no murmurs, rubs, or gallops.  Respiratory: Clear to auscultation bilaterally.  GI: Soft, nontender, non-distended.   MS: Trace bilateral pretibial edema; No deformity. Neuro:  Alert and oriented x 3; Nonfocal.  Psych: Normal affect.  Labs    Chemistry Recent Labs  Lab 08/08/20 0701 08/09/20 0539 08/10/20 0625  NA 139 137 137  K 4.4 3.7 3.9  CL 103 103 100  CO2 25 24 25   GLUCOSE 120* 123* 110*  BUN 32* 26* 25*  CREATININE 1.51* 1.20* 1.19*  CALCIUM 8.5* 8.5* 8.7*  GFRNONAA 37* 49* 50*  ANIONGAP 11 10 12      Hematology Recent Labs  Lab 08/08/20 0701 08/09/20 0539 08/10/20 1200  WBC 7.7 9.8 10.1  RBC 3.29* 3.23* 3.28*  HGB 9.7* 9.7* 9.7*  HCT 30.3* 29.5* 30.8*  MCV 92.1 91.3 93.9  MCH 29.5 30.0 29.6  MCHC 32.0 32.9 31.5  RDW 15.9* 16.3* 16.5*  PLT 200 208 214    Cardiac EnzymesNo results for input(s): TROPONINI in the last 168 hours. No results for input(s): TROPIPOC in the last 168 hours.   BNPNo results for input(s): BNP, PROBNP in the last 168 hours.   DDimer No results for input(s): DDIMER in the last 168 hours.   Radiology    No results found.  Cardiac Studies   2D echo 10/11/2019: 1. Left ventricular ejection fraction, by estimation, is 55 to 60%. The  left ventricle has normal function. The left ventricle has no regional  wall motion abnormalities. Left ventricular diastolic parameters were  normal.  2. Right ventricular systolic function is normal. The right ventricular  size is normal. Tricuspid regurgitation signal is inadequate for assessing  PA pressure.  3. Challenging image quality   Patient Profile     70 y.o. 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 fib on Eliquis having failed multiple antiarrhythmic medications including Tikosyn which was associated with QT prolongation, as well as flecainide status post multiple  DCCV's, chronic combined systolic and diastolic CHF, CKD stage III, hypothyroidism, HTN, and OSA on CPAP who we are seeing for A. fib with RVR.  Assessment & Plan    1. Persistent A. fib with RVR: -Sinus rhythm noted on 07/28/2020 with EKG on 4/14 demonstrating A. Fib -Anticoagulation has been interrupted for left TKA which occurred on 08/07/2020 and was resumed on 08/08/2020 -Ideally, we would transition her to metoprolol from carvedilol for added ventricular rate control.  However, she has a documented allergy/intolerance to metoprolol -Her ventricular rates are improving -With her history of tachy-mediated cardiomyopathy, we have titrated rate control strategy -Titrate carvedilol to 25 mg twice daily, will give dose now -Enstresto was decreased on 4/18 to allow for more BP room to titrate rate controlling medications  -She may be discharged from our perspective given heart rates are predominantly in the low 100s bpm -Given interruption of anticoagulation in the perioperative setting, amiodarone use is not ideal, therefore this was discontinued on 4/18 with recommend to reload with amiodarone after she has been on OAC without interruption for 3-4 weeks -Following reloading of amiodarone, we will pursue outpatient DCCV  -This episode of Afib is likely in the context of her surgery, should she have recurrent Afib, following restoration of sinus, down the road, would have her follow up with EP/Afib clinic  -Recent TSH normal -CHA2DS2-VASc 5 (CHF, HTN, age x 1, vascular disease, sex category) -Eliquis -I will arrange follow up in our office  2.  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 fib with RVR with EF improving following restoration of sinus rhythm -Most recent echo from 09/2019 demonstrated a preserved LVSF with an EF of 55 to 60% -Euvolemic  -She remains on carvedilol, Entresto and low-dose  Lasix  3.  Nonobstructive CAD: -No symptoms concerning for angina -On Eliquis in place of aspirin to minimize bleeding risk -No plans for inpatient ischemic evaluation -PTA Crestor  4.  CKD stage III: -Renal function stable on last check  5.  HTN: -Blood pressure well controlled  -Continue medical therapy as outlined above  6.  Hypothyroidism: -TSH from 05/2020 normal -She remains on replacement therapy with levothyroxine  For questions or updates, please contact [facility] Please consult [email or web address] for contact info under Cardiology/STEMI.    Signed, Eula Listen, PA-C [facility] Pager: [phone] 08/13/2020, 8:00 AM

## 2020-08-13 NOTE — Plan of Care (Signed)
  Problem: Education: Goal: Knowledge of the prescribed therapeutic regimen will improve 08/13/2020 1258 by Jean Rosenthal, RN Outcome: Adequate for Discharge 08/13/2020 1258 by Jean Rosenthal, RN Outcome: Not Progressing 08/13/2020 1016 by Jean Rosenthal, RN Outcome: Progressing Goal: Individualized Educational Video(s) 08/13/2020 1258 by Jean Rosenthal, RN Outcome: Adequate for Discharge 08/13/2020 1258 by Jean Rosenthal, RN Outcome: Not Progressing 08/13/2020 1016 by Jean Rosenthal, RN Outcome: Progressing   Problem: Activity: Goal: Ability to avoid complications of mobility impairment will improve 08/13/2020 1258 by Jean Rosenthal, RN Outcome: Adequate for Discharge 08/13/2020 1258 by Jean Rosenthal, RN Outcome: Not Progressing 08/13/2020 1016 by Jean Rosenthal, RN Outcome: Progressing Goal: Range of joint motion will improve 08/13/2020 1258 by Jean Rosenthal, RN Outcome: Adequate for Discharge 08/13/2020 1258 by Jean Rosenthal, RN Outcome: Not Progressing 08/13/2020 1016 by Jean Rosenthal, RN Outcome: Progressing   Problem: Clinical Measurements: Goal: Postoperative complications will be avoided or minimized 08/13/2020 1258 by Jean Rosenthal, RN Outcome: Adequate for Discharge 08/13/2020 1258 by Jean Rosenthal, RN Outcome: Not Progressing 08/13/2020 1016 by Jean Rosenthal, RN Outcome: Progressing   Problem: Pain Management: Goal: Pain level will decrease with appropriate interventions 08/13/2020 1258 by Jean Rosenthal, RN Outcome: Adequate for Discharge 08/13/2020 1258 by Jean Rosenthal, RN Outcome: Not Progressing 08/13/2020 1016 by Jean Rosenthal, RN Outcome: Progressing

## 2020-08-13 NOTE — Plan of Care (Signed)

## 2020-08-13 NOTE — TOC Progression Note (Signed)
Transition of Care [facility]) - Progression Note    Patient Details  Name: Stacey [name] MRN: [id number] Date of Birth: 04/28/1950  Transition of Care [facility]) CM/SW Contact  Barrie Dunker, RN Phone Number: 08/13/2020, 12:52 PM  Clinical Narrative:   [facility] transport and arranged for [facility] transport to [facility] room 102, Notified the nurse of the arranged transport and where to call report    Expected Discharge Plan: Skilled Nursing Facility Barriers to Discharge: Continued Medical Work up  Expected Discharge Plan and Services Expected Discharge Plan: Skilled Nursing Facility   Discharge Planning Services: CM Consult   Living arrangements for the past 2 months: Single Family Home Expected Discharge Date: 08/13/20                                     Social Determinants of Health (SDOH) Interventions    Readmission Risk Interventions No flowsheet data found.

## 2020-08-13 NOTE — Progress Notes (Signed)
   08/13/20 1327  Clinical Encounter Type  Visited With Patient  Visit Type Initial;Spiritual support;Social support  Referral From Nurse;Chaplain  ?Responded to Pt room for AD education. I provided Pt with AD. Pt is taking AD home with her.

## 2020-08-13 NOTE — Progress Notes (Signed)
Physical Therapy Treatment Patient Details Name: Stacey [name] MRN: [id number] DOB: 05-Apr-1951 Today's Date: 08/13/2020    History of Present Illness Patient s/p L TKA, WBAT. PMH of HTN, HLD, GERD, hypothyroidism, anxiety, PAF, morbid obesity, diastolic HF    PT Comments    Pt was in recliner upon arriving. She agrees to PT session and is cooperative and pleasant throughout. " I'm going to DC to rehab today." Pt does endorse feeling good about DC. She agrees to PT session prior. Was able to stand and ambulate 50 ft with RW CGA. Does continues to have slow step to antalgic gait kinematics. She c/o of non op LE pain and was unable to tolerate greater distances. Once seated in recliner, pt performed there ex and ROM exercises. Was able to progress knee flexion to 86 degrees after several stretching exercises. Overall pt is progressing towards all acute goals. Will benefit from SNF at DC to progress pt to PLOF and become completely independent.    Follow Up Recommendations  SNF     Equipment Recommendations  3in1 (PT)       Precautions / Restrictions Precautions Precautions: Fall;Knee Precaution Booklet Issued: Yes (comment) Restrictions Weight Bearing Restrictions: Yes LLE Weight Bearing: Weight bearing as tolerated    Mobility  Bed Mobility    General bed mobility comments: In recliner pre/post session    Transfers Overall transfer level: Needs assistance Equipment used: Rolling walker (2 wheeled) Transfers: Sit to/from Stand Sit to Stand: Min guard         General transfer comment: CGA for safety  Ambulation/Gait Ambulation/Gait assistance: Min guard Gait Distance (Feet): 50 Feet Assistive device: Rolling walker (2 wheeled) Gait Pattern/deviations: Step-to pattern;Antalgic Gait velocity: decreased   General Gait Details: pt ambulate with slow step to pattern. HR elevated to 116 bpm at peak      Balance Overall balance assessment: Needs  assistance Sitting-balance support: Feet supported Sitting balance-Leahy Scale: Good Sitting balance - Comments: no LOB seated EOB   Standing balance support: Bilateral upper extremity supported;During functional activity Standing balance-Leahy Scale: Good Standing balance comment: reliant on BUE support       Cognition Arousal/Alertness: Awake/alert Behavior During Therapy: WFL for tasks assessed/performed Overall Cognitive Status: Within Functional Limits for tasks assessed      General Comments: Pt is A and O x 4. cooperative and pleasant      Exercises Total Joint Exercises Ankle Circles/Pumps: AROM;Both;20 reps Quad Sets: AROM;Strengthening;Left;20 reps Short Arc Quad: AROM;Strengthening;Left;20 reps Heel Slides: Left;AAROM;20 reps Goniometric ROM: 86 degrees flex        Pertinent Vitals/Pain Pain Assessment: 0-10 Pain Score: 3  Faces Pain Scale: Hurts a little bit Pain Location: L knee with knee flexion Pain Descriptors / Indicators: Aching;Sore Pain Intervention(s): Limited activity within patient's tolerance;Monitored during session;Premedicated before session;Repositioned;Ice applied           PT Goals (current goals can now be found in the care plan section) Acute Rehab PT Goals Patient Stated Goal: Rehab then home Progress towards PT goals: Progressing toward goals    Frequency    BID      PT Plan Current plan remains appropriate       AM-PAC PT "6 Clicks" Mobility   Outcome Measure  Help needed turning from your back to your side while in a flat bed without using bedrails?: A Little Help needed moving from lying on your back to sitting on the side of a flat bed without using bedrails?: A Little Help  needed moving to and from a bed to a chair (including a wheelchair)?: A Little Help needed standing up from a chair using your arms (e.g., wheelchair or bedside chair)?: A Little Help needed to walk in hospital room?: A Little Help needed  climbing 3-5 steps with a railing? : A Lot 6 Click Score: 17    End of Session Equipment Utilized During Treatment: Gait belt Activity Tolerance: Patient tolerated treatment well Patient left: in chair;with call bell/phone within reach;with chair alarm set Nurse Communication: Mobility status PT Visit Diagnosis: Other abnormalities of gait and mobility (R26.89);Difficulty in walking, not elsewhere classified (R26.2);Pain;Muscle weakness (generalized) (M62.81) Pain - Right/Left: Left Pain - part of body: Knee     Time: 1000-1023 PT Time Calculation (min) (ACUTE ONLY): 23 min  Charges:  $Gait Training: 8-22 mins $Therapeutic Exercise: 8-22 mins                     Jetta Lout PTA 08/13/20, 11:59 AM

## 2020-08-14 ENCOUNTER — Encounter: Payer: Self-pay | Admitting: Surgery

## 2020-08-28 ENCOUNTER — Encounter: Payer: Self-pay | Admitting: Nurse Practitioner

## 2020-08-28 ENCOUNTER — Ambulatory Visit (INDEPENDENT_AMBULATORY_CARE_PROVIDER_SITE_OTHER): Payer: Medicare Other | Admitting: Nurse Practitioner

## 2020-08-28 ENCOUNTER — Other Ambulatory Visit: Payer: Self-pay

## 2020-08-28 VITALS — BP 110/80 | HR 95 | Ht 63.0 in | Wt 244.0 lb

## 2020-08-28 DIAGNOSIS — I4819 Other persistent atrial fibrillation: Secondary | ICD-10-CM

## 2020-08-28 DIAGNOSIS — I5032 Chronic diastolic (congestive) heart failure: Secondary | ICD-10-CM

## 2020-08-28 DIAGNOSIS — E782 Mixed hyperlipidemia: Secondary | ICD-10-CM

## 2020-08-28 DIAGNOSIS — I10 Essential (primary) hypertension: Secondary | ICD-10-CM

## 2020-08-28 DIAGNOSIS — N183 Chronic kidney disease, stage 3 unspecified: Secondary | ICD-10-CM

## 2020-08-28 MED ORDER — AMIODARONE HCL 200 MG PO TABS
200.0000 mg | ORAL_TABLET | Freq: Two times a day (BID) | ORAL | 3 refills | Status: DC
Start: 2020-09-07 — End: 2020-12-11

## 2020-08-28 NOTE — Progress Notes (Signed)
Office Visit    Patient Name: Stacey [name] Date of Encounter: 08/28/2020  Primary Care Provider:  Marguarite Arbour, MD Primary Cardiologist:  [name], MD  Chief Complaint    70 year old female with a history of paroxysmal atrial fibrillation on Eliquis and amiodarone, heart failure with improved ejection fraction, hypertension, hyperlipidemia, GERD, hypothyroidism, anxiety, obstructive sleep apnea on CPAP, nonobstructive CAD, drug-induced torsades de pointes (Tikosyn-June 2019), and obesity, who presents for follow-up related to atrial fibrillation.  Past Medical History    Past Medical History:  Diagnosis Date  . Anxiety   . Aortic atherosclerosis (HCC)   . Arthritis    "knees" (09/26/2017)  . Bacteremia    a.09/2019 E. coli bacteremia - unclear source.  . 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 Dx 08/2013 in setting of rapid afib;  b. 08/2013 Echo: EF 55-60%, mildly dil LA, nl RV; c. 08/2017 Echo: EF 20-25% (in afib); d. 11/2017 Echo: EF 50-55%; e. 09/2019 Echo: EF 55-60%, no rwma.  . CKD (chronic kidney disease), stage III (HCC)   . 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 GERD (gastroesophageal reflux disease)   . History of blood transfusion 1975   "related to ectopic pregnancy  . HLD (hyperlipidemia)   . Hypertension   . Hypothyroidism   . Non-obstructive CAD    a. 10/2013 Myoview: anteroseptal, apical, septal mild ischemia, nl LV fxn;  b. Cath: LAD 65m LCX 20ost, RCA 80m.  . Obesity   . OSA on CPAP   . Paroxysmal atrial fibrillation (HCC)    a. Dx 08/2013. CHA2DS2VASc = 4 (Eliquis); b. Prev on flecainide w/ multiple DCCVs; c. 09/2017 Tikosyn added-->TdP 2/2 prolonged QT->amio; d. 11/2018 s/p DCCV-amio cont; e. 01/2020 recurrent Afib on low-dose amio-->DCCV (200J x 3), Amio cont @ 200 Daily.  . Pneumonia    "1-2 times" (09/26/2017)  . Systolic ejection murmur   . Tachycardia induced cardiomyopathy (HCC)    a. 08/2017 Echo: EF 20-25%,  diff HK in setting of Afib w/ RVR; b. 11/2017 Echo: EF 50-55%; c. 09/2019 Echo: EF 55-60%.   Past Surgical History:  Procedure Laterality Date  . BREAST BIOPSY Right 2007   stereo-neg  . CARDIAC CATHETERIZATION  10/2013   [facility]  . CARDIOVERSION N/A 08/29/2017   Procedure: CARDIOVERSION;  Surgeon: Iran Ouch, MD;  Location: [facility] OR[facility];  Service: Cardiovascular;  Laterality: N/A;  . CARDIOVERSION     "I've had a total of 4" (09/26/2017)  . CARDIOVERSION N/A 12/08/2018   Procedure: CARDIOVERSION;  Surgeon: Antonieta Iba, MD;  Location: [facility];  Service: Cardiovascular;  Laterality: N/A;  . CARDIOVERSION N/A 02/18/2020   Procedure: CARDIOVERSION;  Surgeon: Iran Ouch, MD;  Location: [facility];  Service: Cardiovascular;  Laterality: N/A;  . ECTOPIC PREGNANCY SURGERY  1975  . ORIF ANKLE FRACTURE Right 10/01/2019   Procedure: OPEN REDUCTION INTERNAL FIXATION (ORIF) ANKLE FRACTURE;  Surgeon: Christena Flake, MD;  Location: [facility];  Service: Orthopedics;  Laterality: Right;  . TONSILLECTOMY    . TOTAL KNEE ARTHROPLASTY Left 08/07/2020   Procedure: TOTAL KNEE ARTHROPLASTY;  Surgeon: Christena Flake, MD;  Location: [facility];  Service: Orthopedics;  Laterality: Left;    Allergies  Allergies  Allergen Reactions  . Levaquin [Levofloxacin In D5w] Other (See Comments)    Severe dizziness   . Metoprolol Itching, Nausea And Vomiting, Rash and Other (See Comments)    Dizziness  . Tikosyn [Dofetilide] Other (  See Comments)    Developed Torsades on Tikosyn 09/28/17  . Ace Inhibitors Cough  . Adhesive [Tape] Rash    History of Present Illness    70 year old female with above complex past medical history including paroxysmal atrial fibrillation, hypertension, hyperlipidemia, GERD, hypothyroidism, anxiety, obstructive sleep apnea, nonobstructive CAD, drug-induced torsades to pointes [name] 2019), obesity, and heart failure with improved ejection fraction.  Atrial fibrillation was first noted  in 2015.  She subsequently underwent diagnostic catheterization revealing nonobstructive CAD.  She required several cardioversions and had recurrent atrial fibrillation in May 2019 with echo at that time showing an EF of 20-25% with diffuse hypokinesis.  Cardiomyopathy was felt to be tachycardia mediated.  In June 2019, she was admitted for Tikosyn loading with reversion to sinus rhythm but subsequent development of prolonged QT and torsades.  Tikosyn was discontinued and she was subsequently placed on amiodarone therapy.  EF normalized with restoration of sinus rhythm.  She required repeat cardioversion August 2020 secondary to recurrent atrial fibrillation.  In June 2021, she was admitted with right ankle fracture following mechanical fall and subsequently required readmission for respiratory failure, pulmonary edema, and E. coli bacteremia.  CT of the abdomen showed moderate cholelithiasis but no acute findings.  Was felt that infectious source could be secondary to passed gallstone.  During hospitalization, she had elevated LFTs and acute kidney injury with a peak creatinine of 1.91.  In the setting of LFT elevation, hypotension, bradycardia, and QT prolongation, amiodarone was held during hospitalization and subsequent rehab stay.  When seen in follow-up in August, she was bradycardic but out of concern for potential of recurrent A. fib, she was placed back on amiodarone 100 mg daily.  Unfortunately, at follow-up October 15, she reported a 1 to 2-week history of palpitations and dyspnea and she was found to be in A. fib with RVR.  Amiodarone dose was increased to 200 mg twice daily and she subsequently underwent successful cardioversion on October 25 (200 J x 3).  Amiodarone dose was reduced to 200 mg daily at that time.  At follow-up in December 2021, she was noted to have prolonged QT and mild hypomagnesium.  Mag-Ox dosing was adjusted and amiodarone was continued.  She was last seen in clinic on April 5,  at which time she was doing well and pending left knee arthroplasty.  This was performed in April 14th, and unfortunately, she was noted to be in Afib post-op (preop ecg also showed afib on 4/14).  As anticoagulation was interrupted for surgery, our team preferred rate control and anticoagulation for 3 to 4 weeks prior to reloading with amiodarone in the outpatient setting.  She was subsequently discharged on carvedilol 25 mg twice daily and Eliquis 5 mg twice daily.  She initially went to rehab for a week but has since been rehabbing at home.  She continues to use Norco for left knee pain and in that setting, her husband has noticed that she is sometimes forgetful and often sleepy.  She also has not been sleeping well at night due to issues with her CPAP mask.  She has not been experiencing palpitations and denies chest pain, dyspnea, or edema.  She believes she might be taking her amiodarone but is not really sure.  She says she thinks they resumed it in rehab.  Home Medications    Prior to Admission medications   Medication Sig Start Date End Date Taking? Authorizing Provider  amiodarone (PACERONE) 200 MG tablet Take 200 mg by mouth  daily.   Yes [provider]  carvedilol (COREG) 25 MG tablet Take 1 tablet (25 mg total) by mouth 2 (two) times daily. 08/13/20  Yes Evon Slack, PA-C  diclofenac sodium (VOLTAREN) 1 % GEL Apply 1 application topically 4 (four) times daily as needed (knee pain).   Yes [provider]  ELIQUIS 5 MG TABS tablet TAKE 1 TABLET(5 MG) BY MOUTH TWICE DAILY 03/17/20  Yes Iran Ouch, MD  esomeprazole (NEXIUM) 40 MG capsule TAKE 1 CAPSULE BY MOUTH EVERY MORNING 12/15/17  Yes Duke Salvia, MD  furosemide (LASIX) 20 MG tablet Take 40 mg by mouth daily.   Yes [provider]  HYDROcodone-acetaminophen (NORCO) 10-325 MG tablet Take 1 tablet by mouth every 4 (four) hours as needed for moderate pain. 08/07/20  Yes Anson Oregon, PA-C   levothyroxine (SYNTHROID) 100 MCG tablet Take 100 mcg by mouth daily before breakfast. 10/02/18  Yes [provider]  magnesium oxide (MAG-OX) 400 MG tablet Take 400 mg by mouth daily.   Yes [provider]  ondansetron (ZOFRAN) 4 MG tablet Take 1 tablet (4 mg total) by mouth every 6 (six) hours as needed for nausea. 08/07/20  Yes Anson Oregon, PA-C  rosuvastatin (CRESTOR) 10 MG tablet Take 10 mg by mouth at bedtime. 09/29/19  Yes [provider]  sacubitril-valsartan (ENTRESTO) 24-26 MG Take 1 tablet by mouth 2 (two) times daily. 08/13/20  Yes Evon Slack, PA-C  venlafaxine (EFFEXOR) 37.5 MG tablet Take 37.5 mg by mouth 2 (two) times daily. 01/19/20  Yes [provider]  zolpidem (AMBIEN) 10 MG tablet Take 10 mg by mouth at bedtime.   Yes [provider]    Review of Systems    Still having left knee pain requiring narcotics.  In that setting, she has had some daytime somnolence and also memory issues.  She denies palpitations, chest pain, dyspnea, PND, orthopnea, dizziness, syncope, edema, or early satiety.  All other systems reviewed and are otherwise negative except as noted above.  Physical Exam    VS:  BP 110/80 (BP Location: Right Arm, Patient Position: Sitting, Cuff Size: Large)   Pulse 95   Ht 5\' 3"  (1.6 m)   Wt 244 lb (110.7 kg)   SpO2 94%   BMI 43.22 kg/m  , BMI Body mass index is 43.22 kg/m. GEN: Obese, in no acute distress. HEENT: normal. Neck: Supple, no JVD, carotid bruits, or masses. Cardiac: Irregularly, irregular, no murmurs, rubs, or gallops. No clubbing, cyanosis, edema.  Radials/DP/PT 2+ and equal bilaterally.  Respiratory:  Respirations regular and unlabored, clear to auscultation bilaterally. GI: Soft, nontender, nondistended, BS + x 4. MS: no deformity or atrophy. Skin: warm and dry, no rash. Neuro:  Strength and sensation are intact. Psych: Normal affect.  Accessory Clinical Findings    ECG personally  reviewed by me today -atrial fibrillation, 95 poor R wave progression- no acute changes.  Lab Results  Component Value Date   WBC 10.1 08/10/2020   HGB 9.7 (L) 08/10/2020   HCT 30.8 (L) 08/10/2020   MCV 93.9 08/10/2020   PLT 214 08/10/2020   Lab Results  Component Value Date   CREATININE 1.19 (H) 08/10/2020   BUN 25 (H) 08/10/2020   NA 137 08/10/2020   K 3.9 08/10/2020   CL 100 08/10/2020   CO2 25 08/10/2020   Lab Results  Component Value Date   ALT 18 07/28/2020   AST 18 07/28/2020   ALKPHOS 59  07/28/2020   BILITOT 0.7 07/28/2020   Lab Results  Component Value Date   CHOL 194 08/28/2013   HDL 56 08/28/2013   LDLCALC 114 (H) 08/28/2013   TRIG 122 08/28/2013    Assessment & Plan    1.  Persistent atrial fibrillation: Patient with a history of A. fib status post prior cardioversions and maintained on amiodarone therapy.  She recently presented for left total knee arthroplasty and in that setting, Eliquis was held.  She developed atrial fibrillation perioperatively while anticoagulation was held.  Beta-blocker therapy was titrated by our team during hospital stay with improved rate control.  She was advised to hold off on resuming amiodarone for 3 to 4 weeks to allow for therapeutic anticoagulation prior to rhythm management.  Discharge summary indicates the patient was discharged on amiodarone 200 mg daily and she believes this is what she's been taking.  I recommend that as previously advised, she hold amiodarone until May 15, at which time she can resume 200 mg twice daily.  I will see her back approximately 1 week later and if she remains in A. fib at that time, we will plan on elective cardioversion.  She will otherwise continue beta-blocker and Eliquis therapy.  I stressed the importance of Eliquis compliance and she has not missed any doses.  2.  Chronic combined systolic and diastolic congestive heart failure: Echocardiogram in June 2021 showed normal LV function.  She is  euvolemic on examination and despite atrial fibrillation, has been feeling well.  She remains on beta-blocker, Entresto, and Lasix therapy.  Of note, Entresto therapy was reduced during hospitalization to make room for additional titration of beta-blocker.  3.  Essential hypertension: Stable.  4.  Hyperlipidemia: She remains on statin therapy.  LDL was 93 in February with normal LFTs.  5.  Obstructive sleep apnea: On CPAP at home.  Having some recent struggles with her mask but she is working on it.  6.  Status post left total knee arthroplasty: Doing rehab at home on her own.  Still using hydrocodone acetaminophen associated with some daytime somnolence and memory loss.  Management per Ortho.  7.  CKD III:  Creat stable @ discharge 4/17.  8.  Disposition: Patient will resume amiodarone 200 mg twice daily in 10 days and I will see her back shortly thereafter with plan for cardioversion if necessary.   Nicolasa Ducking, NP 08/28/2020, 4:47 PM

## 2020-08-28 NOTE — Patient Instructions (Addendum)
Medication Instructions:  Your physician has recommended you make the following change in your medication:   1. START on MAY 15 th Amiodarone 200 mg twice a day  *If you need a refill on your cardiac medications before your next appointment, please call your pharmacy*   Lab Work: None  If you have labs (blood work) drawn today and your tests are completely normal, you will receive your results only by: Marland Kitchen MyChart Message (if you have MyChart) OR . A paper copy in the mail If you have any lab test that is abnormal or we need to change your treatment, we will call you to review the results.   Testing/Procedures: None   Follow-Up: At [facility], you and your health needs are our priority.  As part of our continuing mission to provide you with exceptional heart care, we have created designated Provider Care Teams.  These Care Teams include your primary Cardiologist (physician) and Advanced Practice Providers (APPs -  Physician Assistants and Nurse Practitioners) who all work together to provide you with the care you need, when you need it.  We recommend signing up for the patient portal called "MyChart".  Sign up information is provided on this After Visit Summary.  MyChart is used to connect with patients for Virtual Visits (Telemedicine).  Patients are able to view lab/test results, encounter notes, upcoming appointments, etc.  Non-urgent messages can be sent to your provider as well.   To learn more about what you can do with MyChart, go to [email or web address].    Your next appointment:   Follow up appointment needs to be the week of May 23-27  The format for your next appointment:   In Person  Provider:   Nicolasa Ducking, NP

## 2020-09-04 ENCOUNTER — Encounter: Payer: Self-pay | Admitting: Internal Medicine

## 2020-09-04 ENCOUNTER — Other Ambulatory Visit: Payer: Self-pay

## 2020-09-04 ENCOUNTER — Ambulatory Visit (INDEPENDENT_AMBULATORY_CARE_PROVIDER_SITE_OTHER): Payer: Medicare Other | Admitting: Internal Medicine

## 2020-09-04 VITALS — BP 106/64 | HR 103 | Ht 63.0 in | Wt 241.8 lb

## 2020-09-04 DIAGNOSIS — I428 Other cardiomyopathies: Secondary | ICD-10-CM | POA: Diagnosis not present

## 2020-09-04 DIAGNOSIS — I5022 Chronic systolic (congestive) heart failure: Secondary | ICD-10-CM | POA: Diagnosis not present

## 2020-09-04 DIAGNOSIS — I4819 Other persistent atrial fibrillation: Secondary | ICD-10-CM

## 2020-09-04 NOTE — Progress Notes (Signed)
Patient Care Team: Marguarite Arbour, MD as PCP - General (Internal Medicine) Iran Ouch, MD as PCP - Cardiology (Cardiology) Duke Salvia, MD as PCP - Electrophysiology (Cardiology)   HPI  Stacey [name] is a 70 y.o. 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    Hospitalized 6/19 for initiation of dofetilide, reverted to sinus but developed TdP an dQT prolongation. It was washed out without recurrent TdP and amiodarone initiated.  anAnticoagulation with Xarelto.  Bruising without bleeding.>> apixoban.   She saw Dr. Fawn Kirk 6/19 for consideration of catheter ablation and at that point elected to continue amiodarone.  Seen 8/20 with recurrent atrial fibrillation underwent DCCV  Amiodarone dosing has gone up and down with concerns of QT prolongation and bradycardia.   Underwent cardioversion most recently 10/21, found to be in atrial fibrillation preoperative for knee arthroplasty having been in sinus rhythm a week or so before in early April 2022  She has been seen with the plan to resume amiodarone and then undertake cardioversion.  There was some issue as to whether she may have missed some doses of Eliquis.    Rehab is going pretty well     DATE TEST EF   7/15 LHC  55-60 % No obs CAD  5/19 Echo   20.25 %   8/19 Echo  50-55%   6/21 Echo  55-60%    Date Cr K TSH  LFTs Hgb  6/19 1.13  2.75 16 13.1(5/19)  10/19   11.9    12/19 1.2  8.15 18   7/20 1.4 4.4 1.83  13.0   4/22 1.19 3.9 1.626 (CE)  17 (CE)  9.7       Records and Results Reviewed outpt labs and notes   Past Medical History:  Diagnosis Date  . Anxiety   . Aortic atherosclerosis (HCC)   . Arthritis    "knees" (09/26/2017)  . Bacteremia    a.09/2019 E. coli bacteremia - unclear source.  . 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 Dx 08/2013 in setting of rapid afib;  b. 08/2013 Echo: EF 55-60%, mildly dil LA, nl RV; c. 08/2017 Echo:  EF 20-25% (in afib); d. 11/2017 Echo: EF 50-55%; e. 09/2019 Echo: EF 55-60%, no rwma.  . CKD (chronic kidney disease), stage III (HCC)   . 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 GERD (gastroesophageal reflux disease)   . History of blood transfusion 1975   "related to ectopic pregnancy  . HLD (hyperlipidemia)   . Hypertension   . Hypothyroidism   . Non-obstructive CAD    a. 10/2013 Myoview: anteroseptal, apical, septal mild ischemia, nl LV fxn;  b. Cath: LAD 64m LCX 20ost, RCA 1m.  . Obesity   . OSA on CPAP   . Paroxysmal atrial fibrillation (HCC)    a. Dx 08/2013. CHA2DS2VASc = 4 (Eliquis); b. Prev on flecainide w/ multiple DCCVs; c. 09/2017 Tikosyn added-->TdP 2/2 prolonged QT->amio; d. 11/2018 s/p DCCV-amio cont; e. 01/2020 recurrent Afib on low-dose amio-->DCCV (200J x 3), Amio cont @ 200 Daily.  . Pneumonia    "1-2 times" (09/26/2017)  . Systolic ejection murmur   . Tachycardia induced cardiomyopathy (HCC)    a. 08/2017 Echo: EF 20-25%, diff HK in setting of Afib w/ RVR; b. 11/2017 Echo: EF 50-55%; c. 09/2019 Echo: EF 55-60%.    Past Surgical History:  Procedure Laterality Date  . BREAST BIOPSY  Right 2007   stereo-neg  . CARDIAC CATHETERIZATION  10/2013   [facility]  . CARDIOVERSION N/A 08/29/2017   Procedure: CARDIOVERSION;  Surgeon: Iran Ouch, MD;  Location: [facility] ORS;  Service: Cardiovascular;  Laterality: N/A;  . CARDIOVERSION     "I've had a total of 4" (09/26/2017)  . CARDIOVERSION N/A 12/08/2018   Procedure: CARDIOVERSION;  Surgeon: Antonieta Iba, MD;  Location: [facility] OR[facility];  Service: Cardiovascular;  Laterality: N/A;  . CARDIOVERSION N/A 02/18/2020   Procedure: CARDIOVERSION;  Surgeon: Iran Ouch, MD;  Location: [facility] OR[facility];  Service: Cardiovascular;  Laterality: N/A;  . ECTOPIC PREGNANCY SURGERY  1975  . ORIF ANKLE FRACTURE Right 10/01/2019   Procedure: OPEN REDUCTION INTERNAL FIXATION (ORIF) ANKLE FRACTURE;  Surgeon: Christena Flake, MD;  Location: [facility] ORS;   Service: Orthopedics;  Laterality: Right;  . TONSILLECTOMY    . TOTAL KNEE ARTHROPLASTY Left 08/07/2020   Procedure: TOTAL KNEE ARTHROPLASTY;  Surgeon: Christena Flake, MD;  Location: [facility] OR[facility];  Service: Orthopedics;  Laterality: Left;    Current Meds  Medication Sig  . [START ON 09/07/2020] amiodarone (PACERONE) 200 MG tablet Take 1 tablet (200 mg total) by mouth 2 (two) times daily. START on Sep 07, 2020  . carvedilol (COREG) 25 MG tablet Take 1 tablet (25 mg total) by mouth 2 (two) times daily.  . diclofenac sodium (VOLTAREN) 1 % GEL Apply 1 application topically 4 (four) times daily as needed (knee pain).  Marland Kitchen ELIQUIS 5 MG TABS tablet TAKE 1 TABLET(5 MG) BY MOUTH TWICE DAILY  . esomeprazole (NEXIUM) 40 MG capsule TAKE 1 CAPSULE BY MOUTH EVERY MORNING  . furosemide (LASIX) 20 MG tablet Take 40 mg by mouth daily.  Marland Kitchen HYDROcodone-acetaminophen (NORCO) 10-325 MG tablet Take 1 tablet by mouth every 4 (four) hours as needed for moderate pain.  Marland Kitchen levothyroxine (SYNTHROID) 100 MCG tablet Take 100 mcg by mouth daily before breakfast.  . magnesium oxide (MAG-OX) 400 MG tablet Take 400 mg by mouth daily.  . Multiple Vitamin (MULTIVITAMIN ADULT PO) Take by mouth daily.  . ondansetron (ZOFRAN) 4 MG tablet Take 1 tablet (4 mg total) by mouth every 6 (six) hours as needed for nausea.  . rosuvastatin (CRESTOR) 10 MG tablet Take 10 mg by mouth at bedtime.  . sacubitril-valsartan (ENTRESTO) 24-26 MG Take 1 tablet by mouth 2 (two) times daily.  Marland Kitchen venlafaxine (EFFEXOR) 37.5 MG tablet Take 37.5 mg by mouth 2 (two) times daily.  Marland Kitchen zolpidem (AMBIEN) 10 MG tablet Take 10 mg by mouth at bedtime.    Allergies  Allergen Reactions  . Levaquin [Levofloxacin In D5w] Other (See Comments)    Severe dizziness   . Metoprolol Itching, Nausea And Vomiting, Rash and Other (See Comments)    Dizziness  . Tikosyn [Dofetilide] Other (See Comments)    Developed Torsades on Tikosyn 09/28/17  . Ace Inhibitors Cough  . Adhesive  [Tape] Rash      Review of Systems negative except from HPI and PMH  Physical Exam  BP 106/64   Pulse (!) 103   Ht 5\' 3"  (1.6 m)   Wt 241 lb 12.8 oz (109.7 kg)   BMI 42.83 kg/m  Well developed and Morbidly obese in no acute distress HENT normal Neck supple   Clear Irregularly irregular rate and rhythm, no murmurs or gallops Abd-soft with active BS No Clubbing cyanosis tredema Skin-warm and dry A & Oriented  Grossly normal sensory and motor function  ECG atrial fibrillation at 103  Interval-/08/38 poor R wave progression   Assessment and  Plan  Cardiomyopathy-presumed nonischemic-rate related --interval near normalization  CHF chronic systolic>> diastolic  Atrial fibrillation-persistent  High Risk Medication Surveillance-- Amiodarone therapy  Hypothyroidism  Iatrogenic -- treated   Treated sleep apnea  Macxrocytosis   Anemia  She is back in atrial fibrillation and has been for about 5 weeks.  We will have her resume her amiodarone at this juncture and then cardioversion.  This needs to be delayed for the graduation of their grandson who is valedictorian at his local school   I have encouraged her to be open to reconsidering catheter ablation given her young age.  She has treated sleep apnea.  Clearly her obesity would be an attenuating concern but should not be preclusive.  She is euvolemic.  We will continue on her current medications.  It is probably reasonable to continue her Entresto in light of the fact that she has a resolved cardiomyopathy.  There is likely adjunctive benefit for spironolactone and SGLT2.  We will wait to following cardioversion  Anemia is likely secondary to her surgery     Current medicines are reviewed at length with the patient today .  The patient does not  have concerns regarding medicines.

## 2020-09-04 NOTE — Patient Instructions (Addendum)
Medication Instructions:  - Your physician has recommended you make the following change in your medication:   1) RESUME Amiodarone 200 mg- take 1 tablet by mouth TWICE daily  *If you need a refill on your cardiac medications before your next appointment, please call your pharmacy*   Lab Work: - none ordered  If you have labs (blood work) drawn today and your tests are completely normal, you will receive your results only by: Marland Kitchen MyChart Message (if you have MyChart) OR . A paper copy in the mail If you have any lab test that is abnormal or we need to change your treatment, we will call you to review the results.   Testing/Procedures: - Your physician has recommended that you have a Cardioversion (DCCV). Electrical Cardioversion uses a jolt of electricity to your heart either through paddles or wired patches attached to your chest. This is a controlled, usually prescheduled, procedure. Defibrillation is done under light anesthesia in the hospital, and you usually go home the day of the procedure. This is done to get your heart back into a normal rhythm. You are not awake for the procedure.    - Call Herbert Seta, RN for Dr. Graciela Husbands when you know the dates of your grandson's celebrations coming up and I will schedule this for you!  You are scheduled for a Cardioversion on ________________ with Dr.___________ Please arrive at the [facility] at _________ a.m. on the day of your procedure.  DIET INSTRUCTIONS:  Nothing to eat or drink after midnight except your medications with a              sip of water.         1) Labs: __________________  2) Medications:  You may take all of your regular medications the morning of your procedure with enough water to get them down safely unless listed below:  - HOLD lasix (furosemide) the morning of your procedure  3) Must have a responsible person to drive you home.  4) Bring a current list of your medications and current insurance  cards.    If you have any questions after you get home, please call the office at [phone]   Follow-Up: At [facility], you and your health needs are our priority.  As part of our continuing mission to provide you with exceptional heart care, we have created designated Provider Care Teams.  These Care Teams include your primary Cardiologist (physician) and Advanced Practice Providers (APPs -  Physician Assistants and Nurse Practitioners) who all work together to provide you with the care you need, when you need it.  We recommend signing up for the patient portal called "MyChart".  Sign up information is provided on this After Visit Summary.  MyChart is used to connect with patients for Virtual Visits (Telemedicine).  Patients are able to view lab/test results, encounter notes, upcoming appointments, etc.  Non-urgent messages can be sent to your provider as well.   To learn more about what you can do with MyChart, go to [email or web address].    Your next appointment:   3 month(s)  The format for your next appointment:   In Person  Provider:   Sherryl Manges, MD   Other Instructions

## 2020-09-14 ENCOUNTER — Other Ambulatory Visit: Payer: Self-pay | Admitting: Internal Medicine

## 2020-09-14 DIAGNOSIS — Z1231 Encounter for screening mammogram for malignant neoplasm of breast: Secondary | ICD-10-CM

## 2020-09-25 ENCOUNTER — Encounter: Payer: Self-pay | Admitting: Nurse Practitioner

## 2020-09-25 ENCOUNTER — Other Ambulatory Visit: Payer: Self-pay

## 2020-09-25 ENCOUNTER — Ambulatory Visit (INDEPENDENT_AMBULATORY_CARE_PROVIDER_SITE_OTHER): Payer: Medicare Other | Admitting: Nurse Practitioner

## 2020-09-25 VITALS — BP 130/78 | HR 100 | Ht 63.0 in | Wt 244.0 lb

## 2020-09-25 DIAGNOSIS — I10 Essential (primary) hypertension: Secondary | ICD-10-CM

## 2020-09-25 DIAGNOSIS — I428 Other cardiomyopathies: Secondary | ICD-10-CM | POA: Diagnosis not present

## 2020-09-25 DIAGNOSIS — I5042 Chronic combined systolic (congestive) and diastolic (congestive) heart failure: Secondary | ICD-10-CM

## 2020-09-25 DIAGNOSIS — N183 Chronic kidney disease, stage 3 unspecified: Secondary | ICD-10-CM

## 2020-09-25 DIAGNOSIS — G4733 Obstructive sleep apnea (adult) (pediatric): Secondary | ICD-10-CM

## 2020-09-25 DIAGNOSIS — I4819 Other persistent atrial fibrillation: Secondary | ICD-10-CM | POA: Diagnosis not present

## 2020-09-25 DIAGNOSIS — E782 Mixed hyperlipidemia: Secondary | ICD-10-CM

## 2020-09-25 NOTE — Patient Instructions (Addendum)
Medication Instructions:  No changes at this time.  *If you need a refill on your cardiac medications before your next appointment, please call your pharmacy*   Lab Work: Labs today  If you have labs (blood work) drawn today and your tests are completely normal, you will receive your results only by: Marland Kitchen MyChart Message (if you have MyChart) OR . A paper copy in the mail If you have any lab test that is abnormal or we need to change your treatment, we will call you to review the results.   Testing/Procedures: You are scheduled for a Cardioversion on Monday June 6 th with Dr. Kirke Corin Please arrive at the [facility] at 07:00 a.m. on the day of your procedure.  DIET INSTRUCTIONS:  Nothing to eat or drink after midnight except your medications with a small sip of water.         1) Labs: BMET & CBC today  2) Medications:  HOLD Furosemide the morning of the cardioversion. YOU MAY TAKE ALL of your other remaining medications with a small amount of water.  3) Must have a responsible person to drive you home.  4) Bring a current list of your medications and current insurance cards.    If you have any questions after you get home, please call the office at [phone]    Follow-Up: At [facility], you and your health needs are our priority.  As part of our continuing mission to provide you with exceptional heart care, we have created designated Provider Care Teams.  These Care Teams include your primary Cardiologist (physician) and Advanced Practice Providers (APPs -  Physician Assistants and Nurse Practitioners) who all work together to provide you with the care you need, when you need it.  We recommend signing up for the patient portal called "MyChart".  Sign up information is provided on this After Visit Summary.  MyChart is used to connect with patients for Virtual Visits (Telemedicine).  Patients are able to view lab/test results, encounter notes, upcoming appointments, etc.   Non-urgent messages can be sent to your provider as well.   To learn more about what you can do with MyChart, go to [email or web address].    Your next appointment:   Dr. Lalla Brothers on June 15 at 4:00 pm for discussion of Afib ablation. Also keep scheduled appointment with Dr. Graciela Husbands  The format for your next appointment:   In Person  Provider:   Steffanie Dunn, MD

## 2020-09-25 NOTE — Progress Notes (Signed)
Office Visit    Patient Name: Stacey [name] Date of Encounter: 09/25/2020  Primary Care Provider:  Marguarite Arbour, MD Primary Cardiologist:  [name], MD  Chief Complaint    70 year old female with a history of paroxysmal atrial fibrillation on Eliquis and amiodarone, heart failure with improved ejection fraction, hypertension, hyperlipidemia, GERD, hypothyroidism, anxiety, obstructive sleep apnea on CPAP, nonobstructive CAD, drug-induced torsades de pointes (Tikosyn-June 2019), and obesity, who presents for follow-up related atrial fibrillation.  Past Medical History    Past Medical History:  Diagnosis Date  . Anxiety   . Aortic atherosclerosis (HCC)   . Arthritis    "knees" (09/26/2017)  . Bacteremia    a.09/2019 E. coli bacteremia - unclear source.  . 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 Dx 08/2013 in setting of rapid afib;  b. 08/2013 Echo: EF 55-60%, mildly dil LA, nl RV; c. 08/2017 Echo: EF 20-25% (in afib); d. 11/2017 Echo: EF 50-55%; e. 09/2019 Echo: EF 55-60%, no rwma.  . CKD (chronic kidney disease), stage III (HCC)   . 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 GERD (gastroesophageal reflux disease)   . History of blood transfusion 1975   "related to ectopic pregnancy  . HLD (hyperlipidemia)   . Hypertension   . Hypothyroidism   . Non-obstructive CAD    a. 10/2013 Myoview: anteroseptal, apical, septal mild ischemia, nl LV fxn;  b. Cath: LAD 76m LCX 20ost, RCA 8m.  . Obesity   . OSA on CPAP   . Paroxysmal atrial fibrillation (HCC)    a. Dx 08/2013. CHA2DS2VASc = 4 (Eliquis); b. Prev on flecainide w/ multiple DCCVs; c. 09/2017 Tikosyn added-->TdP 2/2 prolonged QT->amio; d. 11/2018 s/p DCCV-amio cont; e. 01/2020 recurrent Afib on low-dose amio-->DCCV (200J x 3), Amio cont @ 200 Daily.  . Pneumonia    "1-2 times" (09/26/2017)  . Systolic ejection murmur   . Tachycardia induced cardiomyopathy (HCC)    a. 08/2017 Echo: EF 20-25%, diff  HK in setting of Afib w/ RVR; b. 11/2017 Echo: EF 50-55%; c. 09/2019 Echo: EF 55-60%.   Past Surgical History:  Procedure Laterality Date  . BREAST BIOPSY Right 2007   stereo-neg  . CARDIAC CATHETERIZATION  10/2013   [facility]  . CARDIOVERSION N/A 08/29/2017   Procedure: CARDIOVERSION;  Surgeon: Iran Ouch, MD;  Location: [facility];  Service: Cardiovascular;  Laterality: N/A;  . CARDIOVERSION     "I've had a total of 4" (09/26/2017)  . CARDIOVERSION N/A 12/08/2018   Procedure: CARDIOVERSION;  Surgeon: Antonieta Iba, MD;  Location: [facility];  Service: Cardiovascular;  Laterality: N/A;  . CARDIOVERSION N/A 02/18/2020   Procedure: CARDIOVERSION;  Surgeon: Iran Ouch, MD;  Location: [facility];  Service: Cardiovascular;  Laterality: N/A;  . ECTOPIC PREGNANCY SURGERY  1975  . ORIF ANKLE FRACTURE Right 10/01/2019   Procedure: OPEN REDUCTION INTERNAL FIXATION (ORIF) ANKLE FRACTURE;  Surgeon: Christena Flake, MD;  Location: [facility];  Service: Orthopedics;  Laterality: Right;  . TONSILLECTOMY    . TOTAL KNEE ARTHROPLASTY Left 08/07/2020   Procedure: TOTAL KNEE ARTHROPLASTY;  Surgeon: Christena Flake, MD;  Location: [facility];  Service: Orthopedics;  Laterality: Left;    Allergies  Allergies  Allergen Reactions  . Levaquin [Levofloxacin In D5w] Other (See Comments)    Severe dizziness   . Metoprolol Itching, Nausea And Vomiting, Rash and Other (See Comments)    Dizziness  . Tikosyn [Dofetilide] Other (See  Comments)    Developed Torsades on Tikosyn 09/28/17  . Ace Inhibitors Cough  . Adhesive [Tape] Rash    History of Present Illness    70 year old female with the above complex past medical history including paroxysmal atrial fibrillation, hypertension, hyperlipidemia, GERD, hypothyroidism, anxiety, sleep apnea, nonobstructive CAD, drug induced torsades the pointes [name] 2019), obesity, and heart failure with improved ejection fraction.  Atrial fibrillation was first noted in 2015.   She subsequently underwent diagnostic catheterization revealing nonobstructive CAD.  She required several cardioversions and had recurrent atrial fibrillation in May 2019, with echo at that time showing an EF of 20-25% with diffuse hypokinesis.  Cardiomyopathy was felt to be tachycardia mediated.  In June 2019, she was admitted for Tikosyn loading with reversion to sinus rhythm with subsequent development of prolonged QT and torsades.  Tikosyn was discontinued and she was subsequently placed on amiodarone therapy.  EF normalized with restoration of sinus rhythm.  She did require repeat cardioversion in August 2020 secondary to atrial fibrillation.  In June 2021, she was admitted for right ankle fracture following mechanical fall and subsequently required readmission for respiratory failure, pulmonary edema, and E. coli bacteremia.  It was ultimately felt that infectious source could be secondary to a passed gallstone.  During hospitalization, she had elevated LFTs and acute kidney injury with a peak creatinine of 1.91.  In the setting of LFT elevation, hypotension, bradycardia, and QT prolongation, amiodarone was held during hospitalization and subsequent rehab stay.  When she followed up in August 2020, she was bradycardic but out of concern for potential recurrent A. fib, she was placed back on amiodarone 100 mg daily.  Unfortunately at follow-up on February 08, 2020, she was found to be back in atrial fibrillation requiring titration of amiodarone dose and repeat cardioversion February 18, 2020.  In April of this year, she underwent left knee arthroplasty and was noted to be in atrial fibrillation postoperatively (preop ECG also showed A. fib).  As anticoagulation had been interrupted, we recommended 3 to 4 weeks of anticoagulation.  We also recommended against resuming amiodarone at the time given interruption in anticoagulation.  At office follow-up on May 5, she remained in atrial fibrillation.  She also  remained in atrial fibrillation at electrophysiology follow-up on May 12.  At that point, amiodarone was resumed.  Since her last visit, she has noted some dyspnea on exertion when walking up stairs but otherwise has been feeling well.  She feels as though her knee is completely rehabbed at this point.  She denies chest pain, palpitations, PND, orthopnea, dizziness, syncope, edema, or early satiety.  She has been compliant with amiodarone and has not missed any doses of Eliquis.  She is interested in pursuing cardioversion and has also expressed interest in EP follow-up to discuss A. fib ablation.  Home Medications    Prior to Admission medications   Medication Sig Start Date End Date Taking? Authorizing Provider  amiodarone (PACERONE) 200 MG tablet Take 1 tablet (200 mg total) by mouth 2 (two) times daily. START on Sep 07, 2020 09/07/20  Yes Creig Hines, NP  carvedilol (COREG) 25 MG tablet Take 1 tablet (25 mg total) by mouth 2 (two) times daily. 08/13/20  Yes Evon Slack, PA-C  diclofenac sodium (VOLTAREN) 1 % GEL Apply 1 application topically 4 (four) times daily as needed (knee pain).   Yes [provider]  ELIQUIS 5 MG TABS tablet TAKE 1 TABLET(5 MG) BY MOUTH TWICE DAILY 03/17/20  Yes  Arida, Muhammad A, MD  esomeprazole (NEXIUM) 40 MG capsule TAKE 1 CAPSULE BY MOUTH EVERY MORNING 12/15/17  Yes Klein, Steven C, MD  furosemide (LASIX) 20 MG tablet Take 40 mg by mouth daily.   Yes [provider]  HYDROcodone-acetaminophen (NORCO) 10-325 MG tablet Take 1 tablet by mouth every 4 (four) hours as needed for moderate pain. 08/07/20  Yes McGhee, James Lance, PA-C  levothyroxine (SYNTHROID) 100 MCG tablet Take 100 mcg by mouth daily before breakfast. 10/02/18  Yes [provider]  magnesium oxide (MAG-OX) 400 MG tablet Take 400 mg by mouth daily.   Yes [provider]  Multiple Vitamin (MULTIVITAMIN ADULT PO) Take by mouth daily.   Yes [provider]  ondansetron (ZOFRAN) 4 MG tablet Take 1 tablet (4 mg total) by mouth every 6 (six) hours as needed for nausea. 08/07/20  Yes McGhee, James Lance, PA-C  rosuvastatin (CRESTOR) 10 MG tablet Take 10 mg by mouth at bedtime. 09/29/19  Yes [provider]  sacubitril-valsartan (ENTRESTO) 24-26 MG Take 1 tablet by mouth 2 (two) times daily. 08/13/20  Yes Gaines, Thomas C, PA-C  venlafaxine (EFFEXOR) 37.5 MG tablet Take 37.5 mg by mouth 2 (two) times daily. 01/19/20  Yes [provider]  zolpidem (AMBIEN) 10 MG tablet Take 10 mg by mouth at bedtime.   Yes [provider]   Family History   Family History  Problem Relation Age of Onset  . CAD Father        s/p cabg - ? h/o afib.  . Heart attack Father   . Hypertension Mother   . Breast cancer Mother 55    Social History   Social History   Socioeconomic History  . Marital status: Married    Spouse name: Not on file  . Number of children: Not on file  . Years of education: Not on file  . Highest education level: Not on file  Occupational History  . Not on file  Tobacco Use  . Smoking status: Never Smoker  . Smokeless tobacco: Never Used  Vaping Use  . Vaping Use: Never used  Substance and Sexual Activity  . Alcohol use: Yes    Alcohol/week: 7.0 standard drinks    Types: 7 Glasses of wine per week    Comment: 1 glass of wine/day.  . Drug use: Never  . Sexual activity: Not Currently  Other Topics Concern  . Not on file  Social History Narrative   Lives in [place].  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    Social Determinants of Health   Financial Resource Strain: Not on file  Food Insecurity: Not on file  Transportation Needs: Not on file  Physical Activity: Not on file  Stress: Not on file  Social Connections: Not on file  Intimate Partner Violence: Not on file     Review of Systems    She has noted some DOE when walking up stairs.  At this point, she feels as though her knee is completely rehabbed.   She denies chest pain, palpitations, PND, orthopnea, dizziness, syncope, edema, or early satiety.  All other systems reviewed and are otherwise negative except as noted above.  Physical Exam    VS:  BP 130/78 (BP Location: Right Arm, Patient Position: Sitting, Cuff Size: Normal)   Pulse 100   Ht 5' 3" (1.6 m)   Wt 244 lb (110.7 kg)   SpO2 98%   BMI 43.22 kg/m  , BMI Body mass index is   43.22 kg/m. GEN: Well nourished, well developed, in no acute distress. HEENT: normal. Neck: Supple, obese, difficult to gauge JVP, no carotid bruits, or masses. Cardiac: IR, IR, no murmurs, rubs, or gallops. No clubbing, cyanosis, edema.  Radials 2+ /PT 1+ and equal bilaterally.  Respiratory:  Respirations regular and unlabored, clear to auscultation bilaterally. GI: Soft, nontender, nondistended, BS + x 4. MS: no deformity or atrophy. Skin: warm and dry, no rash. Neuro:  Strength and sensation are intact. Psych: Normal affect.  Accessory Clinical Findings    ECG personally reviewed by me today -atrial fibrillation, 100, left axis deviation, poor R wave progression, nonspecific T changes- no acute changes.  Lab Results  Component Value Date   WBC 10.1 08/10/2020   HGB 9.7 (L) 08/10/2020   HCT 30.8 (L) 08/10/2020   MCV 93.9 08/10/2020   PLT 214 08/10/2020   Lab Results  Component Value Date   CREATININE 1.19 (H) 08/10/2020   BUN 25 (H) 08/10/2020   NA 137 08/10/2020   K 3.9 08/10/2020   CL 100 08/10/2020   CO2 25 08/10/2020   Lab Results  Component Value Date   ALT 18 07/28/2020   AST 18 07/28/2020   ALKPHOS 59 07/28/2020   BILITOT 0.7 07/28/2020   Labs dated June 15, 2020 from care everywhere: Total cholesterol 197, triglycerides 101, HDL 83.4, LDL 93.  Assessment & Plan    1.  Persistent atrial fibrillation: Patient with a history of A. fib status post multiple prior cardioversions currently on amiodarone and Eliquis therapy.  She most recently developed recurrent atrial  fibrillation prior to knee surgery.  Amio dose at that time was 200 mg daily.  As Eliquis was held pending surgery, we recommended 3 to 4 weeks of therapeutic anticoagulation and rate control prior to resumption of amiodarone.  Amiodarone was resumed on May 12 however, she remains in A. fib today.  She does note dyspnea on exertion with inclines and stairs but otherwise, has been asymptomatic.  She has been compliant with Eliquis and has not missed any doses.  She is interested in pursuing cardioversion. We discussed the low risk nature of direct current cardioversion today with risks including VF due to anesthesia or lack of synchronization between the DC shock and the QRS complex, thromboembolus due to insufficient anticoagulant therapy, non-sustained VT, atrial arrhythmia, heart block, bradycardia, transient left bundle branch block, myocardial necrosis, myocardial dysfunction, transient hypotension, pulmonary edema, and skin burn and she is willing to proceed.  We will follow-up labs today.  She is also expressed interest in EP follow-up for consideration of catheter ablation.  We will arrange for follow-up with Dr. Lalla Brothers post cardioversion.  2.  Chronic combined systolic and diastolic congestive heart failure: Patient with a history of LV dysfunction in the setting of A. fib though echocardiogram in June 2021 showed normal LV function.  She has been having dyspnea on exertion with inclines and stairs, since reversion to A. fib in April.  She is euvolemic on examination and remains on beta-blocker, Entresto, and furosemide.  3.  Essential hypertension: Stable.  4.  Hyperlipidemia: Remains on statin therapy with an LDL of 93 in February and normal LFTs in April.  5.  Obstructive sleep apnea: On CPAP at home.  6.  Stage III chronic kidney disease: Creatinine stable at 1.19 in April.  We will follow-up today in preparation for cardioversion.  7.  Disposition: Plan for cardioversion next week with Dr.  Kirke Corin.  We will then arrange  for follow-up with Dr. Lalla Brothers regarding long-term management of A. fib and potential role of catheter ablation.  Nicolasa Ducking, NP 09/25/2020, 1:52 PM

## 2020-09-25 NOTE — H&P (View-Only) (Signed)
Office Visit    Patient Name: Stacey [name] Date of Encounter: 09/25/2020  Primary Care Provider:  Marguarite Arbour, MD Primary Cardiologist:  Stacey Bears, MD  Chief Complaint    70 year old female with a history of paroxysmal atrial fibrillation on Eliquis and amiodarone, heart failure with improved ejection fraction, hypertension, hyperlipidemia, GERD, hypothyroidism, anxiety, obstructive sleep apnea on CPAP, nonobstructive CAD, drug-induced torsades de pointes (Tikosyn-June 2019), and obesity, who presents for follow-up related atrial fibrillation.  Past Medical History    Past Medical History:  Diagnosis Date  . Anxiety   . Aortic atherosclerosis (HCC)   . Arthritis    "knees" (09/26/2017)  . Bacteremia    a.09/2019 E. coli bacteremia - unclear source.  . 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 Dx 08/2013 in setting of rapid afib;  b. 08/2013 Echo: EF 55-60%, mildly dil LA, nl RV; c. 08/2017 Echo: EF 20-25% (in afib); d. 11/2017 Echo: EF 50-55%; e. 09/2019 Echo: EF 55-60%, no rwma.  . CKD (chronic kidney disease), stage III (HCC)   . 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 GERD (gastroesophageal reflux disease)   . History of blood transfusion 1975   "related to ectopic pregnancy  . HLD (hyperlipidemia)   . Hypertension   . Hypothyroidism   . Non-obstructive CAD    a. 10/2013 Myoview: anteroseptal, apical, septal mild ischemia, nl LV fxn;  b. Cath: LAD 76m LCX 20ost, RCA 8m.  . Obesity   . OSA on CPAP   . Paroxysmal atrial fibrillation (HCC)    a. Dx 08/2013. CHA2DS2VASc = 4 (Eliquis); b. Prev on flecainide w/ multiple DCCVs; c. 09/2017 Tikosyn added-->TdP 2/2 prolonged QT->amio; d. 11/2018 s/p DCCV-amio cont; e. 01/2020 recurrent Afib on low-dose amio-->DCCV (200J x 3), Amio cont @ 200 Daily.  . Pneumonia    "1-2 times" (09/26/2017)  . Systolic ejection murmur   . Tachycardia induced cardiomyopathy (HCC)    a. 08/2017 Echo: EF 20-25%, diff  HK in setting of Afib w/ RVR; b. 11/2017 Echo: EF 50-55%; c. 09/2019 Echo: EF 55-60%.   Past Surgical History:  Procedure Laterality Date  . BREAST BIOPSY Right 2007   stereo-neg  . CARDIAC CATHETERIZATION  10/2013   [facility]  . CARDIOVERSION N/A 08/29/2017   Procedure: CARDIOVERSION;  Surgeon: Stacey Ouch, MD;  Location: [facility] ORS;  Service: Cardiovascular;  Laterality: N/A;  . CARDIOVERSION     "I've had a total of 4" (09/26/2017)  . CARDIOVERSION N/A 12/08/2018   Procedure: CARDIOVERSION;  Surgeon: Stacey Iba, MD;  Location: [facility];  Service: Cardiovascular;  Laterality: N/A;  . CARDIOVERSION N/A 02/18/2020   Procedure: CARDIOVERSION;  Surgeon: Stacey Ouch, MD;  Location: [facility];  Service: Cardiovascular;  Laterality: N/A;  . ECTOPIC PREGNANCY SURGERY  1975  . ORIF ANKLE FRACTURE Right 10/01/2019   Procedure: OPEN REDUCTION INTERNAL FIXATION (ORIF) ANKLE FRACTURE;  Surgeon: Stacey Flake, MD;  Location: [facility];  Service: Orthopedics;  Laterality: Right;  . TONSILLECTOMY    . TOTAL KNEE ARTHROPLASTY Left 08/07/2020   Procedure: TOTAL KNEE ARTHROPLASTY;  Surgeon: Stacey Flake, MD;  Location: [facility];  Service: Orthopedics;  Laterality: Left;    Allergies  Allergies  Allergen Reactions  . Levaquin [Levofloxacin In D5w] Other (See Comments)    Severe dizziness   . Metoprolol Itching, Nausea And Vomiting, Rash and Other (See Comments)    Dizziness  . Tikosyn [Dofetilide] Other (See  Comments)    Developed Torsades on Tikosyn 09/28/17  . Ace Inhibitors Cough  . Adhesive [Tape] Rash    History of Present Illness    70 year old female with the above complex past medical history including paroxysmal atrial fibrillation, hypertension, hyperlipidemia, GERD, hypothyroidism, anxiety, sleep apnea, nonobstructive CAD, drug induced torsades the pointes Stacey [name] 2019), obesity, and heart failure with improved ejection fraction.  Atrial fibrillation was first noted in 2015.   She subsequently underwent diagnostic catheterization revealing nonobstructive CAD.  She required several cardioversions and had recurrent atrial fibrillation in May 2019, with echo at that time showing an EF of 20-25% with diffuse hypokinesis.  Cardiomyopathy was felt to be tachycardia mediated.  In June 2019, she was admitted for Tikosyn loading with reversion to sinus rhythm with subsequent development of prolonged QT and torsades.  Tikosyn was discontinued and she was subsequently placed on amiodarone therapy.  EF normalized with restoration of sinus rhythm.  She did require repeat cardioversion in August 2020 secondary to atrial fibrillation.  In June 2021, she was admitted for right ankle fracture following mechanical fall and subsequently required readmission for respiratory failure, pulmonary edema, and E. coli bacteremia.  It was ultimately felt that infectious source could be secondary to a passed gallstone.  During hospitalization, she had elevated LFTs and acute kidney injury with a peak creatinine of 1.91.  In the setting of LFT elevation, hypotension, bradycardia, and QT prolongation, amiodarone was held during hospitalization and subsequent rehab stay.  When she followed up in August 2020, she was bradycardic but out of concern for potential recurrent A. fib, she was placed back on amiodarone 100 mg daily.  Unfortunately at follow-up on February 08, 2020, she was found to be back in atrial fibrillation requiring titration of amiodarone dose and repeat cardioversion February 18, 2020.  In April of this year, she underwent left knee arthroplasty and was noted to be in atrial fibrillation postoperatively (preop ECG also showed A. fib).  As anticoagulation had been interrupted, we recommended 3 to 4 weeks of anticoagulation.  We also recommended against resuming amiodarone at the time given interruption in anticoagulation.  At office follow-up on May 5, she remained in atrial fibrillation.  She also  remained in atrial fibrillation at electrophysiology follow-up on May 12.  At that point, amiodarone was resumed.  Since her last visit, she has noted some dyspnea on exertion when walking up stairs but otherwise has been feeling well.  She feels as though her knee is completely rehabbed at this point.  She denies chest pain, palpitations, PND, orthopnea, dizziness, syncope, edema, or early satiety.  She has been compliant with amiodarone and has not missed any doses of Eliquis.  She is interested in pursuing cardioversion and has also expressed interest in EP follow-up to discuss A. fib ablation.  Home Medications    Prior to Admission medications   Medication Sig Start Date End Date Taking? Authorizing Provider  amiodarone (PACERONE) 200 MG tablet Take 1 tablet (200 mg total) by mouth 2 (two) times daily. START on Sep 07, 2020 09/07/20  Yes Creig Hines, NP  carvedilol (COREG) 25 MG tablet Take 1 tablet (25 mg total) by mouth 2 (two) times daily. 08/13/20  Yes Evon Slack, PA-C  diclofenac sodium (VOLTAREN) 1 % GEL Apply 1 application topically 4 (four) times daily as needed (knee pain).   Yes [provider]  ELIQUIS 5 MG TABS tablet TAKE 1 TABLET(5 MG) BY MOUTH TWICE DAILY 03/17/20  Yes  Stacey OuchArida, Muhammad A, MD  esomeprazole (NEXIUM) 40 MG capsule TAKE 1 CAPSULE BY MOUTH EVERY MORNING 12/15/17  Yes Duke SalviaKlein, Steven C, MD  furosemide (LASIX) 20 MG tablet Take 40 mg by mouth daily.   Yes [provider]  HYDROcodone-acetaminophen (NORCO) 10-325 MG tablet Take 1 tablet by mouth every 4 (four) hours as needed for moderate pain. 08/07/20  Yes Anson OregonMcGhee, James Lance, PA-C  levothyroxine (SYNTHROID) 100 MCG tablet Take 100 mcg by mouth daily before breakfast. 10/02/18  Yes [provider]  magnesium oxide (MAG-OX) 400 MG tablet Take 400 mg by mouth daily.   Yes [provider]  Multiple Vitamin (MULTIVITAMIN ADULT PO) Take by mouth daily.   Yes [provider]  ondansetron (ZOFRAN) 4 MG tablet Take 1 tablet (4 mg total) by mouth every 6 (six) hours as needed for nausea. 08/07/20  Yes Anson OregonMcGhee, James Lance, PA-C  rosuvastatin (CRESTOR) 10 MG tablet Take 10 mg by mouth at bedtime. 09/29/19  Yes [provider]  sacubitril-valsartan (ENTRESTO) 24-26 MG Take 1 tablet by mouth 2 (two) times daily. 08/13/20  Yes Evon SlackGaines, Thomas C, PA-C  venlafaxine (EFFEXOR) 37.5 MG tablet Take 37.5 mg by mouth 2 (two) times daily. 01/19/20  Yes [provider]  zolpidem (AMBIEN) 10 MG tablet Take 10 mg by mouth at bedtime.   Yes [provider]   Family History   Family History  Problem Relation Age of Onset  . CAD Father        s/p cabg - ? h/o afib.  Marland Kitchen. Heart attack Father   . Hypertension Mother   . Breast cancer Mother [id number]    Social History   Social History   Socioeconomic History  . Marital status: Married    Spouse name: Not on file  . Number of children: Not on file  . Years of education: Not on file  . Highest education level: Not on file  Occupational History  . Not on file  Tobacco Use  . Smoking status: Never Smoker  . Smokeless tobacco: Never Used  Vaping Use  . Vaping Use: Never used  Substance and Sexual Activity  . Alcohol use: Yes    Alcohol/week: 7.0 standard drinks    Types: 7 Glasses of wine per week    Comment: 1 glass of wine/day.  . Drug use: Never  . Sexual activity: Not Currently  Other Topics Concern  . Not on file  Social History Narrative   [place]  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    Social Determinants of Health   Financial Resource Strain: Not on file  Food Insecurity: Not on file  Transportation Needs: Not on file  Physical Activity: Not on file  Stress: Not on file  Social Connections: Not on file  Intimate Partner Violence: Not on file     Review of Systems    She has noted some DOE when walking up stairs.  At this point, she feels as though her knee is completely rehabbed.   She denies chest pain, palpitations, PND, orthopnea, dizziness, syncope, edema, or early satiety.  All other systems reviewed and are otherwise negative except as noted above.  Physical Exam    VS:  BP 130/78 (BP Location: Right Arm, Patient Position: Sitting, Cuff Size: Normal)   Pulse 100   Ht 5\' 3"  (1.6 m)   Wt 244 lb (110.7 kg)   SpO2 98%   BMI 43.22 kg/m  , BMI Body mass index is  43.22 kg/m. GEN: Well nourished, well developed, in no acute distress. HEENT: normal. Neck: Supple, obese, difficult to gauge JVP, no carotid bruits, or masses. Cardiac: IR, IR, no murmurs, rubs, or gallops. No clubbing, cyanosis, edema.  Radials 2+ /PT 1+ and equal bilaterally.  Respiratory:  Respirations regular and unlabored, clear to auscultation bilaterally. GI: Soft, nontender, nondistended, BS + x 4. MS: no deformity or atrophy. Skin: warm and dry, no rash. Neuro:  Strength and sensation are intact. Psych: Normal affect.  Accessory Clinical Findings    ECG personally reviewed by me today -atrial fibrillation, 100, left axis deviation, poor R wave progression, nonspecific T changes- no acute changes.  Lab Results  Component Value Date   WBC 10.1 08/10/2020   HGB 9.7 (L) 08/10/2020   HCT 30.8 (L) 08/10/2020   MCV 93.9 08/10/2020   PLT 214 08/10/2020   Lab Results  Component Value Date   CREATININE 1.19 (H) 08/10/2020   BUN 25 (H) 08/10/2020   NA 137 08/10/2020   K 3.9 08/10/2020   CL 100 08/10/2020   CO2 25 08/10/2020   Lab Results  Component Value Date   ALT 18 07/28/2020   AST 18 07/28/2020   ALKPHOS 59 07/28/2020   BILITOT 0.7 07/28/2020   Labs dated June 15, 2020 from care everywhere: Total cholesterol 197, triglycerides 101, HDL 83.4, LDL 93.  Assessment & Plan    1.  Persistent atrial fibrillation: Patient with a history of A. fib status post multiple prior cardioversions currently on amiodarone and Eliquis therapy.  She most recently developed recurrent atrial  fibrillation prior to knee surgery.  Amio dose at that time was 200 mg daily.  As Eliquis was held pending surgery, we recommended 3 to 4 weeks of therapeutic anticoagulation and rate control prior to resumption of amiodarone.  Amiodarone was resumed on May 12 however, she remains in A. fib today.  She does note dyspnea on exertion with inclines and stairs but otherwise, has been asymptomatic.  She has been compliant with Eliquis and has not missed any doses.  She is interested in pursuing cardioversion. We discussed the low risk nature of direct current cardioversion today with risks including VF due to anesthesia or lack of synchronization between the DC shock and the QRS complex, thromboembolus due to insufficient anticoagulant therapy, non-sustained VT, atrial arrhythmia, heart block, bradycardia, transient left bundle branch block, myocardial necrosis, myocardial dysfunction, transient hypotension, pulmonary edema, and skin burn and she is willing to proceed.  We will follow-up labs today.  She is also expressed interest in EP follow-up for consideration of catheter ablation.  We will arrange for follow-up with Dr. Lalla Brothers post cardioversion.  2.  Chronic combined systolic and diastolic congestive heart failure: Patient with a history of LV dysfunction in the setting of A. fib though echocardiogram in June 2021 showed normal LV function.  She has been having dyspnea on exertion with inclines and stairs, since reversion to A. fib in April.  She is euvolemic on examination and remains on beta-blocker, Entresto, and furosemide.  3.  Essential hypertension: Stable.  4.  Hyperlipidemia: Remains on statin therapy with an LDL of 93 in February and normal LFTs in April.  5.  Obstructive sleep apnea: On CPAP at home.  6.  Stage III chronic kidney disease: Creatinine stable at 1.19 in April.  We will follow-up today in preparation for cardioversion.  7.  Disposition: Plan for cardioversion next week with Dr.  Kirke Corin.  We will then arrange  for follow-up with Dr. Lalla Brothers regarding long-term management of A. fib and potential role of catheter ablation.  Nicolasa Ducking, NP 09/25/2020, 1:52 PM

## 2020-09-26 ENCOUNTER — Telehealth: Payer: Self-pay | Admitting: *Deleted

## 2020-09-26 LAB — CBC
Hematocrit: 36.4 % (ref 34.0–46.6)
Hemoglobin: 11.5 g/dL (ref 11.1–15.9)
MCH: 28.8 pg (ref 26.6–33.0)
MCHC: 31.6 g/dL (ref 31.5–35.7)
MCV: 91 fL (ref 79–97)
Platelets: 284 10*3/uL (ref 150–450)
RBC: 3.99 x10E6/uL (ref 3.77–5.28)
RDW: 16.1 % — ABNORMAL HIGH (ref 11.7–15.4)
WBC: 7.6 10*3/uL (ref 3.4–10.8)

## 2020-09-26 LAB — BASIC METABOLIC PANEL
BUN/Creatinine Ratio: 19 (ref 12–28)
BUN: 26 mg/dL (ref 8–27)
CO2: 24 mmol/L (ref 20–29)
Calcium: 9.2 mg/dL (ref 8.7–10.3)
Chloride: 110 mmol/L — ABNORMAL HIGH (ref 96–106)
Creatinine, Ser: 1.35 mg/dL — ABNORMAL HIGH (ref 0.57–1.00)
Glucose: 110 mg/dL — ABNORMAL HIGH (ref 65–99)
Potassium: 4.8 mmol/L (ref 3.5–5.2)
Sodium: 148 mmol/L — ABNORMAL HIGH (ref 134–144)
eGFR: 43 mL/min/{1.73_m2} — ABNORMAL LOW (ref >59)

## 2020-09-26 NOTE — Telephone Encounter (Signed)
Patient is returning your call, states she will only be available for the next hour.

## 2020-09-26 NOTE — Telephone Encounter (Signed)
-----   Message from Creig Hines, NP sent at 09/26/2020  7:24 AM EDT ----- Labs acceptable for cardioversion. Suspect mild dehydration w/ slight rise in creatinine, Na, and Cl.  Ensure adequate hydration, otw may need to back off of lasix some.

## 2020-09-26 NOTE — Telephone Encounter (Signed)
Left voicemail message to call back for review of results and recommendations.  

## 2020-09-26 NOTE — Telephone Encounter (Signed)
Reviewed results and recommendations with patient and she confirmed she has problems with staying hydrated. She will increase fluid intake and will proceed with cardioversion on Monday. She verbalized understanding of our conversation, agreement with plan, and had no further questions at this time.

## 2020-09-29 ENCOUNTER — Ambulatory Visit
Admission: RE | Admit: 2020-09-29 | Discharge: 2020-09-29 | Disposition: A | Discharge status: Home / Self Care | Payer: Medicare Other | Attending: Cardiovascular Disease | Admitting: Cardiovascular Disease

## 2020-09-29 ENCOUNTER — Ambulatory Visit: Payer: Medicare Other | Admitting: Anesthesiology

## 2020-09-29 ENCOUNTER — Encounter
Admission: RE | Disposition: A | Discharge status: Home / Self Care | Payer: Self-pay | Source: Home / Self Care | Attending: Cardiovascular Disease

## 2020-09-29 ENCOUNTER — Encounter: Payer: Self-pay | Admitting: Cardiovascular Disease

## 2020-09-29 ENCOUNTER — Other Ambulatory Visit: Payer: Self-pay

## 2020-09-29 DIAGNOSIS — I5042 Chronic combined systolic (congestive) and diastolic (congestive) heart failure: Secondary | ICD-10-CM | POA: Insufficient documentation

## 2020-09-29 DIAGNOSIS — Z8249 Family history of ischemic heart disease and other diseases of the circulatory system: Secondary | ICD-10-CM | POA: Diagnosis not present

## 2020-09-29 DIAGNOSIS — Z7901 Long term (current) use of anticoagulants: Secondary | ICD-10-CM | POA: Insufficient documentation

## 2020-09-29 DIAGNOSIS — E785 Hyperlipidemia, unspecified: Secondary | ICD-10-CM | POA: Diagnosis not present

## 2020-09-29 DIAGNOSIS — N183 Chronic kidney disease, stage 3 unspecified: Secondary | ICD-10-CM | POA: Diagnosis not present

## 2020-09-29 DIAGNOSIS — I4819 Other persistent atrial fibrillation: Secondary | ICD-10-CM

## 2020-09-29 DIAGNOSIS — I130 Hypertensive heart and chronic kidney disease with heart failure and stage 1 through stage 4 chronic kidney disease, or unspecified chronic kidney disease: Secondary | ICD-10-CM | POA: Diagnosis not present

## 2020-09-29 DIAGNOSIS — Z79890 Hormone replacement therapy (postmenopausal): Secondary | ICD-10-CM | POA: Diagnosis not present

## 2020-09-29 DIAGNOSIS — Z79899 Other long term (current) drug therapy: Secondary | ICD-10-CM | POA: Diagnosis not present

## 2020-09-29 DIAGNOSIS — Z881 Allergy status to other antibiotic agents status: Secondary | ICD-10-CM | POA: Diagnosis not present

## 2020-09-29 DIAGNOSIS — G4733 Obstructive sleep apnea (adult) (pediatric): Secondary | ICD-10-CM | POA: Diagnosis not present

## 2020-09-29 HISTORY — PX: CARDIOVERSION: SHX1299

## 2020-09-29 SURGERY — CARDIOVERSION
Anesthesia: General

## 2020-09-29 MED ORDER — SODIUM CHLORIDE 0.9 % IV SOLN: INTRAVENOUS | Status: DC | PRN

## 2020-09-29 MED ORDER — PROPOFOL 10 MG/ML IV BOLUS
INTRAVENOUS | Status: DC | PRN
  Administered 2020-09-29 (×2): 20 mg via INTRAVENOUS
  Administered 2020-09-29: 50 mg via INTRAVENOUS
  Administered 2020-09-29: 20 mg via INTRAVENOUS

## 2020-09-29 NOTE — Anesthesia Postprocedure Evaluation (Signed)
Anesthesia Post Note  Patient: Stacey [name]  Procedure(s) Performed: CARDIOVERSION (N/A )  Patient location during evaluation: Specials Recovery Anesthesia Type: General Level of consciousness: awake and alert and oriented Pain management: pain level controlled Vital Signs Assessment: post-procedure vital signs reviewed and stable Respiratory status: spontaneous breathing, nonlabored ventilation and respiratory function stable Cardiovascular status: blood pressure returned to baseline and stable Postop Assessment: no signs of nausea or vomiting Anesthetic complications: no   No complications documented.   Last Vitals:  Vitals:   09/29/20 0915 09/29/20 0925  BP: (!) 97/40 (!) 108/56  Pulse: (!) 59 60  Resp: 16 16  Temp:    SpO2: 97% 96%    Last Pain:  Vitals:   09/29/20 0925  TempSrc:   PainSc: 0-No pain                 Sylvain Hasten

## 2020-09-29 NOTE — Interval H&P Note (Signed)
History and Physical Interval Note:  09/29/2020 8:59 AM  Stacey [name]  has presented today for surgery, with the diagnosis of Cardioversion  Afib  OK Dr Pernell Dupre  Anesthesia  Start time 8a.  The various methods of treatment have been discussed with the patient and family. After consideration of risks, benefits and other options for treatment, the patient has consented to  Procedure(s): CARDIOVERSION (N/A) as a surgical intervention.  The patient's history has been reviewed, patient examined, no change in status, stable for surgery.  I have reviewed the patient's chart and labs.  Questions were answered to the patient's satisfaction.     [name]

## 2020-09-29 NOTE — Transfer of Care (Signed)
Immediate Anesthesia Transfer of Care Note  Patient: Stacey [name]  Procedure(s) Performed: CARDIOVERSION (N/A )  Patient Location: PACU Special procedured  Anesthesia Type:General  Level of Consciousness: awake and patient cooperative  Airway & Oxygen Therapy: Patient Spontanous Breathing and Patient connected to nasal cannula oxygen  Post-op Assessment: Report given to RN and Post -op Vital signs reviewed and stable  Post vital signs: Reviewed and stable  Last Vitals:  Vitals Value Taken Time  BP 106/62 09/29/20 0843  Temp    Pulse 59 09/29/20 0846  Resp 17 09/29/20 0846  SpO2 96 % 09/29/20 0846  Vitals shown include unvalidated device data.  Last Pain:  Vitals:   09/29/20 0718  TempSrc: Oral  PainSc: 0-No pain         Complications: No complications documented.

## 2020-09-29 NOTE — Anesthesia Procedure Notes (Signed)
Procedure Name: MAC Date/Time: 09/29/2020 8:40 AM Performed by: Jerrye Noble, CRNA Pre-anesthesia Checklist: Patient identified, Emergency Drugs available, Suction available and Patient being monitored Patient Re-evaluated:Patient Re-evaluated prior to induction Oxygen Delivery Method: Nasal cannula

## 2020-09-29 NOTE — Anesthesia Preprocedure Evaluation (Signed)
Anesthesia Evaluation  Patient identified by MRN, date of birth, ID band Patient awake    Reviewed: Allergy & Precautions, NPO status , Patient's Chart, lab work & pertinent test results  History of Anesthesia Complications Negative for: history of anesthetic complications  Airway Mallampati: III  TM Distance: >3 FB Neck ROM: Full    Dental no notable dental hx.    Pulmonary sleep apnea and Continuous Positive Airway Pressure Ventilation , neg COPD,    breath sounds clear to auscultation- rhonchi (-) wheezing      Cardiovascular hypertension, Pt. on medications + CAD and +CHF  (-) Cardiac Stents and (-) CABG + dysrhythmias Atrial Fibrillation  Rhythm:Irregular Rate:Normal - Systolic murmurs and - Diastolic murmurs    Neuro/Psych neg Seizures PSYCHIATRIC DISORDERS Anxiety Depression negative neurological ROS     GI/Hepatic Neg liver ROS, GERD  ,  Endo/Other  neg diabetesHypothyroidism   Renal/GU Renal InsufficiencyRenal disease     Musculoskeletal  (+) Arthritis ,   Abdominal (+) + obese,   Peds  Hematology  (+) anemia ,   Anesthesia Other Findings Past Medical History: No date: Anxiety No date: Aortic atherosclerosis (HCC) No date: Arthritis     Comment:  "knees" (09/26/2017) No date: Bacteremia     Comment:  a.09/2019 E. coli bacteremia - unclear source. 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 Dx 08/2013 in setting of rapid afib;  b. 08/2013 Echo:               EF 55-60%, mildly dil LA, nl RV; c. 08/2017 Echo: EF               20-25% (in afib); d. 11/2017 Echo: EF 50-55%; e. 09/2019               Echo: EF 55-60%, no rwma. No date: CKD (chronic kidney disease), stage III (HCC) 10/06/2017: Drug-induced torsades de pointes (HCC) No date: GERD (gastroesophageal reflux disease) 1975: History of blood transfusion     Comment:  "related to ectopic pregnancy No date: HLD  (hyperlipidemia) No date: Hypertension No date: Hypothyroidism No date: Non-obstructive CAD     Comment:  a. 10/2013 Myoview: anteroseptal, apical, septal mild               ischemia, nl LV fxn;  b. Cath: LAD 75m LCX 20ost, RCA               58m. No date: Obesity No date: OSA on CPAP No date: Paroxysmal atrial fibrillation (HCC)     Comment:  a. Dx 08/2013. CHA2DS2VASc = 4 (Eliquis); b. Prev on               flecainide w/ multiple DCCVs; c. 09/2017 Tikosyn               added-->TdP 2/2 prolonged QT->amio; d. 11/2018 s/p               DCCV-amio cont; e. 01/2020 recurrent Afib on low-dose               amio-->DCCV (200J x 3), Amio cont @ 200 Daily. No date: Pneumonia     Comment:  "1-2 times" (09/26/2017) No date: Systolic ejection murmur No date: Tachycardia induced cardiomyopathy (HCC)     Comment:  a. 08/2017 Echo: EF 20-25%, diff HK in setting of Afib w/              RVR; b.  11/2017 Echo: EF 50-55%; c. 09/2019 Echo: EF               55-60%.   Reproductive/Obstetrics                             Anesthesia Physical Anesthesia Plan  ASA: III  Anesthesia Plan: General   Post-op Pain Management:    Induction: Intravenous  PONV Risk Score and Plan: 2 and Propofol infusion  Airway Management Planned: Natural Airway  Additional Equipment:   Intra-op Plan:   Post-operative Plan:   Informed Consent: I have reviewed the patients History and Physical, chart, labs and discussed the procedure including the risks, benefits and alternatives for the proposed anesthesia with the patient or authorized representative who has indicated his/her understanding and acceptance.     Dental advisory given  Plan Discussed with: CRNA and Anesthesiologist  Anesthesia Plan Comments:         Anesthesia Quick Evaluation

## 2020-09-29 NOTE — CV Procedure (Signed)
Cardioversion note: A standard informed consent was obtained. Timeout was performed. The pads were placed in the anterior posterior fashion. The patient was given propofol by the anesthesia team.  Successful cardioversion was performed with a 200 J. The patient converted to sinus rhythm. Pre-and post EKGs were reviewed. The patient tolerated the procedure with no immediate complications.  Recommendations: Continue same medications and follow-up as planned  

## 2020-10-07 NOTE — Progress Notes (Unsigned)
Electrophysiology Office Follow up Visit Note:    Date:  10/07/2020   ID:  Stacey [name], DOB 1950/12/16, MRN [id number]  PCP:  Marguarite Arbour, MD  [facility] Cardiologist:  [name], MD  [facility] Electrophysiologist:  Sherryl Manges, MD    Interval History:    Stacey [name] is a 70 y.o. female who presents for an opinion regarding her atrial fibrillation at the request of Dr. Graciela Husbands.  The patient last saw Christain Sacramento in clinic on September 25, 2020 for her persistent atrial fibrillation.  The patient's medical history includes atrial fibrillation on Eliquis and amiodarone, heart failure with recovered ejection fraction, hypertension, hyperlipidemia, morbid obesity, obstructive sleep apnea on CPAP drug-induced torsades de pointes on Tikosyn in June 2019.  For her atrial fibrillation she has had multiple prior cardioversions.  These cardioversions were in the setting of a reduced left ventricular ejection fraction.  Her depressed EF was thought to be related to atrial fibrillation with rapid ventricular rates.  After she was loaded on Tikosyn and spontaneously converted to normal rhythm she had torsade de pointes.  The Tikosyn was stopped and she was started on amiodarone.  When in normal rhythm, her ejection fraction normalized.  At the last appointment she expressed interest in pursuing ablation to help manage her atrial fibrillation.  The patient last saw Dr. Graciela Husbands Sep 04, 2020.  At that appointment she discussed ablation with Dr. Graciela Husbands.     Past Medical History:  Diagnosis Date   Anxiety    Aortic atherosclerosis (HCC)    Arthritis    "knees" (09/26/2017)   Bacteremia    a.09/2019 E. coli bacteremia - unclear source.   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 Dx 08/2013 in setting of rapid afib;  b. 08/2013 Echo: EF 55-60%, mildly dil LA, nl RV; c. 08/2017 Echo: EF 20-25% (in afib); d. 11/2017 Echo: EF 50-55%; e. 09/2019 Echo: EF 55-60%, no rwma.   CKD  (chronic kidney disease), stage III (HCC)    Drug-induced torsades de pointes (HCC) 10/06/2017   GERD (gastroesophageal reflux disease)    History of blood transfusion 1975   "related to ectopic pregnancy   HLD (hyperlipidemia)    Hypertension    Hypothyroidism    Non-obstructive CAD    a. 10/2013 Myoview: anteroseptal, apical, septal mild ischemia, nl LV fxn;  b. Cath: LAD 28m LCX 20ost, RCA 71m.   Obesity    OSA on CPAP    Paroxysmal atrial fibrillation (HCC)    a. Dx 08/2013. CHA2DS2VASc = 4 (Eliquis); b. Prev on flecainide w/ multiple DCCVs; c. 09/2017 Tikosyn added-->TdP 2/2 prolonged QT->amio; d. 11/2018 s/p DCCV-amio cont; e. 01/2020 recurrent Afib on low-dose amio-->DCCV (200J x 3), Amio cont @ 200 Daily.   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 08/2017 Echo: EF 20-25%, diff HK in setting of Afib w/ RVR; b. 11/2017 Echo: EF 50-55%; c. 09/2019 Echo: EF 55-60%.    Past Surgical History:  Procedure Laterality Date   BREAST BIOPSY Right 2007   stereo-neg   CARDIAC CATHETERIZATION  10/2013   [facility]   CARDIOVERSION N/A 08/29/2017   Procedure: CARDIOVERSION;  Surgeon: Iran Ouch, MD;  Location: [facility];  Service: Cardiovascular;  Laterality: N/A;   CARDIOVERSION     "I've had a total of 4" (09/26/2017)   CARDIOVERSION N/A 12/08/2018   Procedure:  CARDIOVERSION;  Surgeon: Antonieta Iba, MD;  Location: [facility];  Service: Cardiovascular;  Laterality: N/A;   CARDIOVERSION N/A 02/18/2020   Procedure: CARDIOVERSION;  Surgeon: Iran Ouch, MD;  Location: [facility];  Service: Cardiovascular;  Laterality: N/A;   CARDIOVERSION N/A 09/29/2020   Procedure: CARDIOVERSION;  Surgeon: Iran Ouch, MD;  Location: [facility];  Service: Cardiovascular;  Laterality: N/A;   ECTOPIC PREGNANCY SURGERY  1975   ORIF ANKLE FRACTURE Right 10/01/2019   Procedure: OPEN REDUCTION INTERNAL FIXATION (ORIF) ANKLE FRACTURE;  Surgeon:  Christena Flake, MD;  Location: [facility];  Service: Orthopedics;  Laterality: Right;   TONSILLECTOMY     TOTAL KNEE ARTHROPLASTY Left 08/07/2020   Procedure: TOTAL KNEE ARTHROPLASTY;  Surgeon: Christena Flake, MD;  Location: [facility];  Service: Orthopedics;  Laterality: Left;    Current Medications: No outpatient medications have been marked as taking for the 10/08/20 encounter (Appointment) with Lanier Prude, MD.     Allergies:   Levaquin [levofloxacin in d5w], Metoprolol, Tikosyn [dofetilide], Ace inhibitors, and Adhesive [tape]   Social History   Socioeconomic History   Marital status: Married    Spouse name: Oswaldo Done    Number of children: 2   Years of education: Not on file   Highest education level: Not on file  Occupational History   Occupation: retired   Tobacco Use   Smoking status: Never   Smokeless tobacco: Never  Vaping Use   Vaping Use: Never used  Substance and Sexual Activity   Alcohol use: Yes    Alcohol/week: 7.0 standard drinks    Types: 7 Glasses of wine per week    Comment: 1 glass of wine/day.   Drug use: Never   Sexual activity: Not Currently  Other Topics Concern   Not on file  Social History Narrative   Lives in [place].  Works as Water quality scientist.    Social Determinants of Health   Financial Resource Strain: Not on file  Food Insecurity: Not on file  Transportation Needs: Not on file  Physical Activity: Not on file  Stress: Not on file  Social Connections: Not on file     Family History: The patient's family history includes Breast cancer (age of onset: 27) in her mother; CAD in her father; Heart attack in her father; Hypertension in her mother.  ROS:   Please see the history of present illness.    All other systems reviewed and are negative.  EKGs/Labs/Other Studies Reviewed:    The following studies were reviewed today:  October 11, 2019 echo Left ventricular function normal, 55% Right ventricular function normal   EKG:  The  ekg ordered today demonstrates ***  Recent Labs: 10/11/2019: B Natriuretic Peptide 1,800.8; TSH 0.530 04/04/2020: Magnesium 1.9 07/28/2020: ALT 18 09/25/2020: BUN 26; Creatinine, Ser 1.35; Hemoglobin 11.5; Platelets 284; Potassium 4.8; Sodium 148  Recent Lipid Panel    Component Value Date/Time   CHOL 194 08/28/2013 0352   TRIG 122 08/28/2013 0352   HDL 56 08/28/2013 0352   VLDL 24 08/28/2013 0352   LDLCALC 114 (H) 08/28/2013 0352    Physical Exam:    VS:  There were no vitals taken for this visit.    Wt Readings from Last 3 Encounters:  09/29/20 244 lb 0.8 oz (110.7 kg)  09/25/20 244 lb (110.7 kg)  09/04/20 241 lb 12.8 oz (109.7 kg)     GEN: *** Well nourished, well developed in no acute distress HEENT: Normal NECK:  No JVD; No carotid bruits LYMPHATICS: No lymphadenopathy CARDIAC: ***RRR, no murmurs, rubs, gallops RESPIRATORY:  Clear to auscultation without rales, wheezing or rhonchi  ABDOMEN: Soft, non-tender, non-distended MUSCULOSKELETAL:  No edema; No deformity  SKIN: Warm and dry NEUROLOGIC:  Alert and oriented x 3 PSYCHIATRIC:  Normal affect   ASSESSMENT:    No diagnosis found. PLAN:    In order of problems listed above:    Total time spent with patient today *** minutes. This includes reviewing records, evaluating the patient and coordinating care.   Medication Adjustments/Labs and Tests Ordered: Current medicines are reviewed at length with the patient today.  Concerns regarding medicines are outlined above.  No orders of the defined types were placed in this encounter.  No orders of the defined types were placed in this encounter.    Signed, Steffanie Dunn, MD, [facility], [facility] 10/07/2020 7:05 PM    Electrophysiology [facility]

## 2020-10-08 ENCOUNTER — Ambulatory Visit: Payer: Medicare Other | Admitting: Cardiology

## 2020-11-12 ENCOUNTER — Ambulatory Visit: Payer: Medicare Other | Admitting: Cardiology

## 2020-11-24 ENCOUNTER — Telehealth: Payer: Self-pay | Admitting: Cardiovascular Disease

## 2020-11-24 MED ORDER — SACUBITRIL-VALSARTAN 24-26 MG PO TABS: 1.0000 | ORAL_TABLET | Freq: Two times a day (BID) | ORAL | 0 refills | Status: DC

## 2020-11-24 NOTE — Telephone Encounter (Signed)
Requested Prescriptions   Signed Prescriptions Disp Refills   sacubitril-valsartan (ENTRESTO) 24-26 MG 60 tablet 0    Sig: Take 1 tablet by mouth 2 (two) times daily.    Authorizing Provider: [name] A    Ordering User: Thayer Headings, Shylo Zamor L

## 2020-11-24 NOTE — Telephone Encounter (Signed)
*  STAT* If patient is at the pharmacy, call can be transferred to refill team.   1. Which medications need to be refilled? (please list name of each medication and dose if known) Entresto, 1 tablet twice a day  2. Which pharmacy/location (including street and city if local pharmacy) is medication to be sent to? [facility], [place]  3. Do they need a 30 day or 90 day supply? 30 day supply

## 2020-11-26 ENCOUNTER — Ambulatory Visit: Payer: Medicare Other | Admitting: Cardiology

## 2020-12-11 ENCOUNTER — Other Ambulatory Visit: Payer: Self-pay

## 2020-12-11 ENCOUNTER — Ambulatory Visit (INDEPENDENT_AMBULATORY_CARE_PROVIDER_SITE_OTHER): Payer: Medicare Other | Admitting: Internal Medicine

## 2020-12-11 VITALS — BP 98/60 | HR 76 | Ht 63.0 in | Wt 253.8 lb

## 2020-12-11 DIAGNOSIS — I428 Other cardiomyopathies: Secondary | ICD-10-CM | POA: Diagnosis not present

## 2020-12-11 DIAGNOSIS — I4819 Other persistent atrial fibrillation: Secondary | ICD-10-CM

## 2020-12-11 DIAGNOSIS — I5042 Chronic combined systolic (congestive) and diastolic (congestive) heart failure: Secondary | ICD-10-CM

## 2020-12-11 DIAGNOSIS — I10 Essential (primary) hypertension: Secondary | ICD-10-CM

## 2020-12-11 DIAGNOSIS — Z79899 Other long term (current) drug therapy: Secondary | ICD-10-CM

## 2020-12-11 MED ORDER — RANOLAZINE ER 500 MG PO TB12: 500.0000 mg | ORAL_TABLET | Freq: Two times a day (BID) | ORAL | 3 refills | Status: DC

## 2020-12-11 MED ORDER — AMIODARONE HCL 200 MG PO TABS: 200.0000 mg | ORAL_TABLET | Freq: Every day | ORAL | 3 refills | Status: DC

## 2020-12-11 MED ORDER — CARVEDILOL 25 MG PO TABS: 12.5000 mg | ORAL_TABLET | Freq: Two times a day (BID) | ORAL | 0 refills | Status: AC

## 2020-12-11 NOTE — Patient Instructions (Addendum)
Medication Instructions:  - Your physician has recommended you make the following change in your medication:   1) DECREASE amiodarone 200 mg- take 1 tablet by mouth ONCE daily  2) DECREASE carvedilol 25 mg- take 0.5 tablet (12.5 mg) by mouth TWICE daily   3) START ranolazine 500 mg- take 1 tablet by mouth TWICE daily   *If you need a refill on your cardiac medications before your next appointment, please call your pharmacy*   Lab Work: - none ordered today (will need updated lab work pending the date of your cardioversion)  If you have labs (blood work) drawn today and your tests are completely normal, you will receive your results only by: MyChart Message (if you have MyChart) OR A paper copy in the mail If you have any lab test that is abnormal or we need to change your treatment, we will call you to review the results.   Testing/Procedures: - Your physician has recommended that you have a Cardioversion (DCCV)-  2 weeks after you start Ranolazine. Electrical Cardioversion uses a jolt of electricity to your heart either through paddles or wired patches attached to your chest. This is a controlled, usually prescheduled, procedure. Defibrillation is done under light anesthesia in the hospital, and you usually go home the day of the procedure. This is done to get your heart back into a normal rhythm. You are not awake for the procedure. Please see the instruction sheet given to you today.   Please call Herbert Seta, RN for Dr. Graciela Husbands, when you start the Ranolazine and we will pick a date for you procedure.   Follow-Up: At [facility], you and your health needs are our priority.  As part of our continuing mission to provide you with exceptional heart care, we have created designated Provider Care Teams.  These Care Teams include your primary Cardiologist (physician) and Advanced Practice Providers (APPs -  Physician Assistants and Nurse Practitioners) who all work together to provide you with  the care you need, when you need it.  We recommend signing up for the patient portal called "MyChart".  Sign up information is provided on this After Visit Summary.  MyChart is used to connect with patients for Virtual Visits (Telemedicine).  Patients are able to view lab/test results, encounter notes, upcoming appointments, etc.  Non-urgent messages can be sent to your provider as well.   To learn more about what you can do with MyChart, go to [email or web address].    Your next appointment:   (Pending) 4 weeks after your Cardioversion  The format for your next appointment:   In Person  Provider:   Sherryl Manges, MD   Other Instructions  RANEXA (Ranolazine tablets, extended release) What is this medication? RANOLAZINE (ra NOE la zeen) is a heart medicine. It is used to treat chronic chest pain (angina). This medicine must be taken regularly. It will not relievean acute episode of chest pain. This medicine may be used for other purposes; ask your health care provider orpharmacist if you have questions. COMMON BRAND NAME(S): Ranexa What should I tell my care team before I take this medication? They need to know if you have any of these conditions: heart disease irregular heartbeat kidney disease liver disease low levels of potassium or magnesium in the blood an unusual or allergic reaction to ranolazine, other medicines, foods, dyes, or preservatives pregnant or trying to get pregnant breast-feeding How should I use this medication? Take this medicine by mouth with a glass of water. Follow the directions  on the prescription label. Do not cut, crush, or chew this medicine. Take with or without food. Do not take this medication with grapefruit juice. Take yourdoses at regular intervals. Do not take your medicine more often then directed. Talk to your pediatrician regarding the use of this medicine in children.Special care may be needed. Overdosage: If you think you have taken too  much of this medicine contact apoison control center or emergency room at once. NOTE: This medicine is only for you. Do not share this medicine with others. What if I miss a dose? If you miss a dose, take it as soon as you can. If it is almost time for yournext dose, take only that dose. Do not take double or extra doses. What may interact with this medication? Do not take this medicine with any of the following medications: antivirals for HIV or AIDS cerivastatin certain antibiotics like chloramphenicol, clarithromycin, dalfopristin; quinupristin, isoniazid, rifabutin, rifampin, rifapentine certain medicines used for cancer like imatinib, nilotinib certain medicines for fungal infections like fluconazole, itraconazole, ketoconazole, posaconazole, voriconazole certain medicines for irregular heart beat like dronedarone certain medicines for seizures like carbamazepine, fosphenytoin, oxcarbazepine, phenobarbital, phenytoin cisapride conivaptan cyclosporine grapefruit or grapefruit juice lumacaftor; ivacaftor nefazodone pimozide quinacrine St John's wort thioridazine This medicine may also interact with the following medications: alfuzosin certain medicines for depression, anxiety, or psychotic disturbances like bupropion, citalopram, fluoxetine, fluphenazine, paroxetine, perphenazine, risperidone, sertraline, trifluoperazine certain medicines for cholesterol like atorvastatin, lovastatin, simvastatin certain medicines for stomach problems like octreotide, palonosetron, prochlorperazine eplerenone ergot alkaloids like dihydroergotamine, ergonovine, ergotamine, methylergonovine metformin nicardipine other medicines that prolong the QT interval (cause an abnormal heart rhythm) like dofetilide, ziprasidone sirolimus tacrolimus This list may not describe all possible interactions. Give your health care provider a list of all the medicines, herbs, non-prescription drugs, or dietary  supplements you use. Also tell them if you smoke, drink alcohol, or use illegaldrugs. Some items may interact with your medicine. What should I watch for while using this medication? Visit your doctor for regular check ups. Tell your doctor or healthcare professional if your symptoms do not start to get better or if they get worse.This medicine will not relieve an acute attack of angina or chest pain. This medicine can change your heart rhythm. Your health care provider may check your heart rhythm by ordering an electrocardiogram (ECG) while you are takingthis medicine. You may get drowsy or dizzy. Do not drive, use machinery, or do anything that needs mental alertness until you know how this medicine affects you. Do not stand or sit up quickly, especially if you are an older patient. This reduces the risk of dizzy or fainting spells. Alcohol may interfere with the effect ofthis medicine. Avoid alcoholic drinks. If you are scheduled for any medical or dental procedure, tell your healthcare provider that you are taking this medicine. This medicine can interact withother medicines used during surgery. What side effects may I notice from receiving this medication? Side effects that you should report to your doctor or health care professionalas soon as possible: allergic reactions like skin rash, itching or hives, swelling of the face, lips, or tongue breathing problems changes in vision fast, irregular or pounding heartbeat feeling faint or lightheaded, falls low or high blood pressure numbness or tingling feelings ringing in the ears tremor or shakiness slow heartbeat (fewer than 50 beats per minute) swelling of the legs or feet Side effects that usually do not require medical attention (report to yourdoctor or health care professional if they  continue or are bothersome): constipation drowsy dry mouth headache nausea or vomiting stomach upset This list may not describe all possible side  effects. Call your doctor for medical advice about side effects. You may report side effects to FDA at1-800-FDA-1088. Where should I keep my medication? Keep out of the reach of children. Store at room temperature between 15 and 30 degrees C (59 and 86 degrees F).Throw away any unused medicine after the expiration date. NOTE: This sheet is a summary. It may not cover all possible information. If you have questions about this medicine, talk to your doctor, pharmacist, orhealth care provider.  2022 Elsevier/Gold Standard (2018-04-04 09:18:49)

## 2020-12-11 NOTE — Progress Notes (Signed)
Patient Care Team: Marguarite Arbour, MD as PCP - General (Internal Medicine) Iran Ouch, MD as PCP - Cardiology (Cardiology) Duke Salvia, MD as PCP - Electrophysiology (Cardiology)   HPI  Stacey [name] is a 70 y.o. 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    Hospitalized 6/19 for initiation of dofetilide, reverted to sinus but developed TdP an dQT prolongation. It was washed out without recurrent TdP and amiodarone initiated.   Anticoagulation with Xarelto.  Bruising without bleeding.>> apixoban.   She saw Dr. Fawn Kirk 6/19 for consideration of catheter ablation and at that point elected to continue amiodarone.  Seen 8/20 with recurrent atrial fibrillation underwent DCCV  Amiodarone dosing has gone up and down with concerns of QT prolongation and bradycardia.   Underwent cardioversion most recently 10/21, found to be in atrial fibrillation preoperative for knee arthroplasty having been in sinus rhythm a week or so before in early April 2022  Amiodarone was resumed and she underwent cardioversion 6/22.  Was to see, per Dr MA Dr. Merita Norton 6/22 to discuss ablation, but she chose to pass  She is feeling much better, and thinks she is in regular rhythm.  No bleeding.  Patient denies symptoms of GI intolerance, sun sensitivity, neurological symptoms attributable to amiodarone.  Chronic modest dyspnea.  Mild peripheral edema; no chest pain  Rehab is going pretty well     DATE TEST EF   7/15 LHC  55-60 % No obs CAD  5/19 Echo   20.25 %   8/19 Echo  50-55%   6/21 Echo  55-60% LA size normal   Date Cr K TSH  LFTs Hgb  6/19 1.13  2.75 16 13.1(5/19)  10/19   11.9    12/19 1.2  8.15 18   7/20 1.4 4.4 1.83  13.0   6/22 1.19 3.9 1.626 (CE)  17 (CE)  9.7       Records and Results Reviewed outpt labs and notes   Past Medical History:  Diagnosis Date   Anxiety    Aortic atherosclerosis (HCC)    Arthritis    "knees"  (09/26/2017)   Bacteremia    a.09/2019 E. coli bacteremia - unclear source.   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 Dx 08/2013 in setting of rapid afib;  b. 08/2013 Echo: EF 55-60%, mildly dil LA, nl RV; c. 08/2017 Echo: EF 20-25% (in afib); d. 11/2017 Echo: EF 50-55%; e. 09/2019 Echo: EF 55-60%, no rwma.   CKD (chronic kidney disease), stage III (HCC)    Drug-induced torsades de pointes (HCC) 10/06/2017   GERD (gastroesophageal reflux disease)    History of blood transfusion 1975   "related to ectopic pregnancy   HLD (hyperlipidemia)    Hypertension    Hypothyroidism    Non-obstructive CAD    a. 10/2013 Myoview: anteroseptal, apical, septal mild ischemia, nl LV fxn;  b. Cath: LAD 22m LCX 20ost, RCA 22m.   Obesity    OSA on CPAP    Paroxysmal atrial fibrillation (HCC)    a. Dx 08/2013. CHA2DS2VASc = 4 (Eliquis); b. Prev on flecainide w/ multiple DCCVs; c. 09/2017 Tikosyn added-->TdP 2/2 prolonged QT->amio; d. 11/2018 s/p DCCV-amio cont; e. 01/2020 recurrent Afib on low-dose amio-->DCCV (200J x 3), Amio cont @ 200 Daily.   Pneumonia    "1-2 times" (09/26/2017)   Systolic ejection murmur    Tachycardia induced cardiomyopathy (HCC)  a. 08/2017 Echo: EF 20-25%, diff HK in setting of Afib w/ RVR; b. 11/2017 Echo: EF 50-55%; c. 09/2019 Echo: EF 55-60%.    Past Surgical History:  Procedure Laterality Date   BREAST BIOPSY Right 2007   stereo-neg   CARDIAC CATHETERIZATION  10/2013   [facility]   CARDIOVERSION N/A 08/29/2017   Procedure: CARDIOVERSION;  Surgeon: Iran Ouch, MD;  Location: [facility];  Service: Cardiovascular;  Laterality: N/A;   CARDIOVERSION     "I've had a total of 4" (09/26/2017)   CARDIOVERSION N/A 12/08/2018   Procedure: CARDIOVERSION;  Surgeon: Antonieta Iba, MD;  Location: [facility];  Service: Cardiovascular;  Laterality: N/A;   CARDIOVERSION N/A 02/18/2020   Procedure: CARDIOVERSION;  Surgeon: Iran Ouch, MD;  Location: [facility];  Service:  Cardiovascular;  Laterality: N/A;   CARDIOVERSION N/A 09/29/2020   Procedure: CARDIOVERSION;  Surgeon: Iran Ouch, MD;  Location: [facility];  Service: Cardiovascular;  Laterality: N/A;   ECTOPIC PREGNANCY SURGERY  1975   ORIF ANKLE FRACTURE Right 10/01/2019   Procedure: OPEN REDUCTION INTERNAL FIXATION (ORIF) ANKLE FRACTURE;  Surgeon: Christena Flake, MD;  Location: [facility];  Service: Orthopedics;  Laterality: Right;   TONSILLECTOMY     TOTAL KNEE ARTHROPLASTY Left 08/07/2020   Procedure: TOTAL KNEE ARTHROPLASTY;  Surgeon: Christena Flake, MD;  Location: [facility];  Service: Orthopedics;  Laterality: Left;    Current Meds  Medication Sig   amiodarone (PACERONE) 200 MG tablet Take 1 tablet (200 mg total) by mouth 2 (two) times daily. START on Sep 07, 2020   carvedilol (COREG) 25 MG tablet Take 1 tablet (25 mg total) by mouth 2 (two) times daily.   diclofenac sodium (VOLTAREN) 1 % GEL Apply 1 application topically 4 (four) times daily as needed (knee pain).   ELIQUIS 5 MG TABS tablet TAKE 1 TABLET(5 MG) BY MOUTH TWICE DAILY (Patient taking differently: Take 5 mg by mouth 2 (two) times daily.)   esomeprazole (NEXIUM) 40 MG capsule TAKE 1 CAPSULE BY MOUTH EVERY MORNING (Patient taking differently: Take 40 mg by mouth daily.)   furosemide (LASIX) 40 MG tablet Take 40 mg by mouth.   HYDROcodone-acetaminophen (NORCO) 10-325 MG tablet Take 1 tablet by mouth every 4 (four) hours as needed for moderate pain.   levothyroxine (SYNTHROID) 100 MCG tablet Take 100 mcg by mouth daily before breakfast.   magnesium oxide (MAG-OX) 400 MG tablet Take 400 mg by mouth daily.   Multiple Vitamin (MULTIVITAMIN ADULT PO) Take 1 tablet by mouth daily.   ondansetron (ZOFRAN-ODT) 4 MG disintegrating tablet Take 4 mg by mouth every 8 (eight) hours as needed for nausea or vomiting.   rosuvastatin (CRESTOR) 10 MG tablet Take 10 mg by mouth at bedtime.   sacubitril-valsartan (ENTRESTO) 24-26 MG Take 1 tablet by mouth 2 (two)  times daily.   venlafaxine (EFFEXOR) 37.5 MG tablet Take 37.5 mg by mouth 2 (two) times daily.    Allergies  Allergen Reactions   Levaquin [Levofloxacin In D5w] Other (See Comments)    Severe dizziness    Metoprolol Itching, Nausea And Vomiting, Rash and Other (See Comments)    Dizziness   Tikosyn [Dofetilide] Other (See Comments)    Developed Torsades on Tikosyn 09/28/17   Ace Inhibitors Cough   Adhesive [Tape] Rash      Review of Systems negative except from HPI and PMH  Physical Exam  BP 98/60   Pulse 76   Ht 5\' 3"  (1.6 m)  Wt 253 lb 12.8 oz (115.1 kg)   SpO2 92%   BMI 44.96 kg/m  Well developed and nourished in no acute distress HENT normal Neck supple   Clear Irregularly irregular rate and rhythm with controlled ventricular response, no murmurs or gallops Abd-soft   No Clubbing cyanosis tr edema Skin-warm and dry A & Oriented  Grossly normal sensory and motor function   ECG atrial fibrillation at 76 Intervals-/08/357  Assessment and  Plan  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  Now rate controlled.  Much less symptomatic.  Lengthy discussion regarding treatment options i.e. rate versus rhythm control.  Rhythm control therapy seems unlikely to be effective in restoring rhythm; we will undertake a trial of adjunctive ranolazine 500 mg twice daily with her amiodarone which we will decrease from 400--200 mg a day and then repeat cardioversion.  If this does not work, I think the options for her are rate control versus catheter ablation.  We had a lengthy discussion regarding both, we discussed quality of life outcomes the [place] data suggesting improvement in heart  outcomes of patients with catheter ablation who are [place] Eligible.  She will reconsider catheter ablation as an option.  Tolerating her amiodarone but we will decrease the dose.  Surveillance laboratories were normal 6/22   Current medicines are reviewed at length with the patient today .  The patient does not  have concerns regarding medicines.

## 2020-12-22 ENCOUNTER — Other Ambulatory Visit: Payer: Self-pay | Admitting: Nurse Practitioner

## 2021-01-01 ENCOUNTER — Other Ambulatory Visit: Payer: Self-pay | Admitting: Cardiovascular Disease

## 2021-02-06 ENCOUNTER — Other Ambulatory Visit: Payer: Self-pay | Admitting: Cardiovascular Disease

## 2021-02-09 ENCOUNTER — Telehealth: Payer: Self-pay

## 2021-02-09 MED ORDER — APIXABAN 5 MG PO TABS: 5.0000 mg | ORAL_TABLET | Freq: Two times a day (BID) | ORAL | 1 refills | Status: DC

## 2021-02-09 NOTE — Telephone Encounter (Signed)
Eliquis 5 mg refill request received. Patient is 70 years old, weight- 115.1 kg, Crea- 1.4  on 02/03/21, Diagnosis- afib, and last seen by Dr. Graciela Husbands on 12/11/20. Dose is appropriate based on dosing criteria. Will send in refill to requested pharmacy.

## 2021-02-09 NOTE — Addendum Note (Signed)
Addended by: Rhea Belton R on: 02/09/2021 09:49 AM   Modules accepted: Orders

## 2021-02-09 NOTE — Telephone Encounter (Signed)
*  STAT* If patient is at the pharmacy, call can be transferred to refill team.   1. Which medications need to be refilled? (please list name of each medication and dose if known) Eliquis    2. Which pharmacy/location (including street and city if local pharmacy) is medication to be sent to? [facility] [address]  3. Do they need a 30 day or 90 day supply? 90

## 2021-04-09 ENCOUNTER — Other Ambulatory Visit: Payer: Self-pay | Admitting: Cardiovascular Disease

## 2021-06-08 ENCOUNTER — Other Ambulatory Visit: Payer: Self-pay | Admitting: Nurse Practitioner

## 2021-06-09 NOTE — Telephone Encounter (Signed)
Lmovm to verify if pt is taking Magnesium 400 mg.

## 2021-07-09 ENCOUNTER — Other Ambulatory Visit: Payer: Self-pay | Admitting: *Deleted

## 2021-07-09 MED ORDER — APIXABAN 5 MG PO TABS: 5.0000 mg | ORAL_TABLET | Freq: Two times a day (BID) | ORAL | 1 refills | Status: AC

## 2021-07-09 NOTE — Telephone Encounter (Signed)
Prescription refill request for Eliquis received. ?Indication: afib ?Last office visit: klein, 12/11/2020 ?Scr: 1.2, 06/11/2021 ?Age: 71 yo  ?Weight: 115.1 kg  ? ?Refill sent.  ?

## 2021-07-13 ENCOUNTER — Telehealth: Payer: Self-pay | Admitting: *Deleted

## 2021-07-13 NOTE — Telephone Encounter (Signed)
Received refill request from pharmacy for Amiodarone. Patient has been seen for follow up appointment after cardioversion in 09/2020. Please advise if refill is appropriate.  ?

## 2021-07-14 NOTE — Telephone Encounter (Signed)
Ok to refill amiodarone 200 mg once daily # 30 w/ no refills. ? ? ?She was last seen in 11/2020 and was supposed to call me back to schedule a DCCV, but she never did.  ? ?I will forward to scheduling as well to please reach out to her to arrange for a follow up visit as she is overdue anyway at this point because of the amiodarone. ? ?With amiodarone alone, she needs to be seen every 6 months.  ? ?Thanks!  ?

## 2021-07-14 NOTE — Telephone Encounter (Signed)
LVM to schedule

## 2021-07-15 ENCOUNTER — Other Ambulatory Visit: Payer: Self-pay | Admitting: Cardiovascular Disease

## 2021-07-15 NOTE — Telephone Encounter (Signed)
Please contact pt for future appointment. Pt due for 6 month f/u. 

## 2021-07-15 NOTE — Telephone Encounter (Signed)
LVM to schedule

## 2021-08-03 ENCOUNTER — Other Ambulatory Visit: Payer: Self-pay | Admitting: Cardiovascular Disease

## 2021-08-03 NOTE — Telephone Encounter (Signed)
Please contact pt for future appointment. Pt due for f/u. Pt needing refills. 

## 2021-08-11 NOTE — Telephone Encounter (Signed)
Please see note below. 

## 2021-08-19 NOTE — Telephone Encounter (Signed)
Attempted to schedule.  LMOV to call office.  ° °

## 2021-09-02 ENCOUNTER — Other Ambulatory Visit: Payer: Self-pay | Admitting: Cardiovascular Disease

## 2021-09-02 NOTE — Telephone Encounter (Signed)
L mom to schedule 

## 2021-09-03 ENCOUNTER — Other Ambulatory Visit: Payer: Self-pay | Admitting: Cardiovascular Disease

## 2021-09-04 ENCOUNTER — Telehealth: Payer: Self-pay | Admitting: Cardiovascular Disease

## 2021-09-04 ENCOUNTER — Other Ambulatory Visit: Payer: Self-pay

## 2021-09-04 MED ORDER — ENTRESTO 24-26 MG PO TABS: 1.0000 | ORAL_TABLET | Freq: Two times a day (BID) | ORAL | 0 refills | Status: DC

## 2021-09-04 NOTE — Telephone Encounter (Signed)
sacubitril-valsartan (ENTRESTO) 24-26 MG 30 tablet 0 09/04/2021    ?Sig - Route: Take 1 tablet by mouth 2 (two) times daily. PLEASE SCHEDULE OFFICE VISIT WITH DR. Kirke Corin FOR FURTHER REFILLS. THANK YOU! - Oral   ?Sent to pharmacy as: sacubitril-valsartan (ENTRESTO) 24-26 MG   ?E-Prescribing Status: Sent to pharmacy (09/04/2021  3:19 PM EDT)   ? ?Pharmacy ? ?[id number] - [place],  - [address]  ? ?

## 2021-09-04 NOTE — Telephone Encounter (Signed)
?*  STAT* If patient is at the pharmacy, call can be transferred to refill team. ? ? ?1. Which medications need to be refilled? (please list name of each medication and dose if known) sacubitril-valsartan (ENTRESTO) 24-26 MG ? ?2. Which pharmacy/location (including street and city if local pharmacy) is medication to be sent to? [id number] - [place], [place] - [address] ? ?3. Do they need a 30 day or 90 day supply? 30 day ? ?Patient is out of medication.   ?

## 2021-09-04 NOTE — Telephone Encounter (Signed)
Please schedule overdue F/U appointment with Dr. Arida. Thank you! ?

## 2021-09-07 NOTE — Telephone Encounter (Signed)
Patient scheduled for 5/16

## 2021-09-08 ENCOUNTER — Encounter: Payer: Self-pay | Admitting: Medical

## 2021-09-08 ENCOUNTER — Ambulatory Visit (INDEPENDENT_AMBULATORY_CARE_PROVIDER_SITE_OTHER): Payer: Medicare Other | Admitting: Medical

## 2021-09-08 VITALS — BP 140/100 | HR 86 | Ht 63.0 in | Wt 254.0 lb

## 2021-09-08 DIAGNOSIS — E782 Mixed hyperlipidemia: Secondary | ICD-10-CM | POA: Diagnosis not present

## 2021-09-08 DIAGNOSIS — I10 Essential (primary) hypertension: Secondary | ICD-10-CM | POA: Diagnosis not present

## 2021-09-08 DIAGNOSIS — N183 Chronic kidney disease, stage 3 unspecified: Secondary | ICD-10-CM

## 2021-09-08 DIAGNOSIS — I5032 Chronic diastolic (congestive) heart failure: Secondary | ICD-10-CM

## 2021-09-08 DIAGNOSIS — G4733 Obstructive sleep apnea (adult) (pediatric): Secondary | ICD-10-CM

## 2021-09-08 DIAGNOSIS — I4819 Other persistent atrial fibrillation: Secondary | ICD-10-CM

## 2021-09-08 MED ORDER — AMIODARONE HCL 200 MG PO TABS: ORAL_TABLET | ORAL | 1 refills | Status: DC

## 2021-09-08 MED ORDER — MAGNESIUM OXIDE 400 MG PO TABS: 400.0000 mg | ORAL_TABLET | Freq: Every day | ORAL | 5 refills | Status: AC

## 2021-09-08 MED ORDER — AMIODARONE HCL 200 MG PO TABS: 200.0000 mg | ORAL_TABLET | Freq: Every day | ORAL | 5 refills | Status: DC

## 2021-09-08 MED ORDER — ENTRESTO 24-26 MG PO TABS: 1.0000 | ORAL_TABLET | Freq: Two times a day (BID) | ORAL | 1 refills | Status: DC

## 2021-09-08 MED ORDER — MAGNESIUM OXIDE 400 MG PO TABS
400.0000 mg | ORAL_TABLET | Freq: Every day | ORAL | 1 refills | Status: DC
Start: 2021-09-08 — End: 2021-09-08

## 2021-09-08 MED ORDER — ENTRESTO 24-26 MG PO TABS
1.0000 | ORAL_TABLET | Freq: Two times a day (BID) | ORAL | 5 refills | Status: AC
Start: 2021-09-08 — End: 2022-03-07

## 2021-09-08 NOTE — Progress Notes (Signed)
Cardiology Office Note:    Date:  09/08/2021   ID:  FINNLEIGH BURNSED, DOB Oct 01, 1950, MRN [id number]  PCP:  Idelle Crouch, MD  [facility] Cardiologist:  Kathlyn Sacramento, MD  [facility] Electrophysiologist:  Virl Axe, MD   Referring MD: Idelle Crouch, MD   Chief Complaint: 6 month follow-up  History of Present Illness:    Stacey [name] is a 71 y.o. female with a hx of paroxysmal atrial fibrillation on Eliquis and amiodarone, heart failure with improved ejection fraction, hypertension, hyperlipidemia, GERD, hypothyroidism, anxiety, obstructive sleep apnea on CPAP, nonobstructive CAD, drug-induced torsades de pointes (Tikosyn-June 2019), and obesity, who presents for follow-up related atrial fibrillation.  Atrial fibrillation was first noted in 2015.  She subsequently underwent diagnostic catheterization revealing nonobstructive CAD.  She required several cardioversions and had recurrent atrial fibrillation in May 2019, with echo at that time showing an EF of 20-25% with diffuse hypokinesis.  Cardiomyopathy was felt to be tachycardia mediated.  In June 2019, she was admitted for Tikosyn loading with reversion to sinus rhythm with subsequent development of prolonged QT and torsades.  Tikosyn was discontinued and she was subsequently placed on amiodarone therapy.  EF normalized with restoration of sinus rhythm.  She did require repeat cardioversion in August 2020 secondary to atrial fibrillation.  In June 2021, she was admitted for right ankle fracture following mechanical fall and subsequently required readmission for respiratory failure, pulmonary edema, and E. coli bacteremia.  It was ultimately felt that infectious source could be secondary to a passed gallstone.  During hospitalization, she had elevated LFTs and acute kidney injury with a peak creatinine of 1.91.  In the setting of LFT elevation, hypotension, bradycardia, and QT prolongation, amiodarone was held during  hospitalization and subsequent rehab stay.  When she followed up in August 2020, she was bradycardic but out of concern for potential recurrent A. fib, she was placed back on amiodarone 100 mg daily.  Unfortunately at follow-up on February 08, 2020, she was found to be back in atrial fibrillation requiring titration of amiodarone dose and repeat cardioversion February 18, 2020.  In April of this year, she underwent left knee arthroplasty and was noted to be in atrial fibrillation postoperatively (preop ECG also showed A. fib).  As anticoagulation had been interrupted, we recommended 3 to 4 weeks of anticoagulation.  We also recommended against resuming amiodarone at the time given interruption in anticoagulation.  At follow up with EP she remained in Afib and was started on amiodarone. She subsequently underwent cardioversion. She saw EP in follow-up and was back in afib. Ablation was discussed, but was deferred at that time.   Today, the patient reports she is doing about the same. No chest pain. She has some SOB on heavy exertion. No LLE, orthopnea, pnd, palpitations. She uses CPAP at night. She is in rate controlled Afib on amiodarone. She is on eliquis, she denies bleeding issues. She needs refills today.    Past Medical History:  Diagnosis Date   Anxiety    Aortic atherosclerosis ([place])    Arthritis    "knees" (09/26/2017)   Bacteremia    a.09/2019 E. coli bacteremia - unclear source.   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 Dx 08/2013 in setting of rapid afib;  b. 08/2013 Echo: EF 55-60%, mildly dil LA, nl RV; c. 08/2017 Echo: EF 20-25% (in afib); d. 11/2017 Echo: EF 50-55%; e. 09/2019 Echo: EF 55-60%, no rwma.  CKD (chronic kidney disease), stage III ([place])    Drug-induced torsades de pointes ([place]) 10/06/2017   GERD (gastroesophageal reflux disease)    History of blood transfusion 1975   "related to ectopic pregnancy   HLD (hyperlipidemia)    Hypertension    Hypothyroidism     Non-obstructive CAD    a. 10/2013 Myoview: anteroseptal, apical, septal mild ischemia, nl LV fxn;  b. Cath: LAD 19m LCX 20ost, RCA 55m.   Obesity    OSA on CPAP    Paroxysmal atrial fibrillation ([place])    a. Dx 08/2013. CHA2DS2VASc = 4 (Eliquis); b. Prev on flecainide w/ multiple DCCVs; c. 09/2017 Tikosyn added-->TdP 2/2 prolonged QT->amio; d. 11/2018 s/p DCCV-amio cont; e. 01/2020 recurrent Afib on low-dose amio-->DCCV (200J x 3), Amio cont @ 200 Daily.   Pneumonia    "1-2 times" ([id number])   Systolic ejection murmur    Tachycardia induced cardiomyopathy ([place])    a. 08/2017 Echo: EF 20-25%, diff HK in setting of Afib w/ RVR; b. 11/2017 Echo: EF 50-55%; c. 09/2019 Echo: EF 55-60%.    Past Surgical History:  Procedure Laterality Date   BREAST BIOPSY Right 2007   stereo-neg   CARDIAC CATHETERIZATION  10/2013   [facility]   CARDIOVERSION N/A 08/29/2017   Procedure: CARDIOVERSION;  Surgeon: Wellington Hampshire, MD;  Location: [facility] ORS;  Service: Cardiovascular;  Laterality: N/A;   CARDIOVERSION     "I've had a total of 4" (09/26/2017)   CARDIOVERSION N/A 12/08/2018   Procedure: CARDIOVERSION;  Surgeon: Minna Merritts, MD;  Location: [facility] ORS;  Service: Cardiovascular;  Laterality: N/A;   CARDIOVERSION N/A 02/18/2020   Procedure: CARDIOVERSION;  Surgeon: Wellington Hampshire, MD;  Location: [facility] ORS;  Service: Cardiovascular;  Laterality: N/A;   CARDIOVERSION N/A 09/29/2020   Procedure: CARDIOVERSION;  Surgeon: Wellington Hampshire, MD;  Location: [facility] ORS;  Service: Cardiovascular;  Laterality: N/A;   ECTOPIC PREGNANCY SURGERY  1975   ORIF ANKLE FRACTURE Right 10/01/2019   Procedure: OPEN REDUCTION INTERNAL FIXATION (ORIF) ANKLE FRACTURE;  Surgeon: Corky Mull, MD;  Location: [facility] ORS;  Service: Orthopedics;  Laterality: Right;   TONSILLECTOMY     TOTAL KNEE ARTHROPLASTY Left 08/07/2020   Procedure: TOTAL KNEE ARTHROPLASTY;  Surgeon: Corky Mull, MD;  Location: [facility] ORS;  Service: Orthopedics;  Laterality:  Left;    Current Medications: Current Meds  Medication Sig   apixaban (ELIQUIS) 5 MG TABS tablet Take 1 tablet (5 mg total) by mouth 2 (two) times daily.   carvedilol (COREG) 25 MG tablet Take 0.5 tablets (12.5 mg total) by mouth 2 (two) times daily.   diclofenac sodium (VOLTAREN) 1 % GEL Apply 1 application topically 4 (four) times daily as needed (knee pain).   esomeprazole (NEXIUM) 40 MG capsule TAKE 1 CAPSULE BY MOUTH EVERY MORNING   furosemide (LASIX) 40 MG tablet Take 40 mg by mouth daily.   HYDROcodone-acetaminophen (NORCO) 10-325 MG tablet Take 1 tablet by mouth every 4 (four) hours as needed for moderate pain.   levothyroxine (SYNTHROID) 100 MCG tablet Take 100 mcg by mouth daily before breakfast.   Multiple Vitamin (MULTIVITAMIN ADULT PO) Take 1 tablet by mouth daily.   ondansetron (ZOFRAN-ODT) 4 MG disintegrating tablet Take 4 mg by mouth every 8 (eight) hours as needed for nausea or vomiting.   oxyCODONE-acetaminophen (PERCOCET) 10-325 MG tablet Take 1 tablet by mouth every 4 (four) hours as needed.   QUEtiapine (SEROQUEL) 25 MG tablet Take 25 mg by mouth at  bedtime.   rosuvastatin (CRESTOR) 10 MG tablet Take 10 mg by mouth at bedtime.   venlafaxine (EFFEXOR) 37.5 MG tablet Take 37.5 mg by mouth 2 (two) times daily.   [DISCONTINUED] amiodarone (PACERONE) 200 MG tablet TAKE 1 TABLET(200 MG) BY MOUTH DAILY   [DISCONTINUED] ENTRESTO 24-26 MG TAKE 1 TABLET BY MOUTH TWICE DAILY   [DISCONTINUED] magnesium oxide (MAG-OX) 400 MG tablet Take 400 mg by mouth daily.     Allergies:   Levaquin [levofloxacin in d5w], Metoprolol, Tikosyn [dofetilide], Ace inhibitors, and Adhesive [tape]   Social History   Socioeconomic History   Marital status: Married    Spouse name: Evette Doffing    Number of children: 2   Years of education: Not on file   Highest education level: Not on file  Occupational History   Occupation: retired   Tobacco Use   Smoking status: Never   Smokeless tobacco: Never   Vaping Use   Vaping Use: Never used  Substance and Sexual Activity   Alcohol use: Yes    Alcohol/week: 7.0 standard drinks    Types: 7 Glasses of wine per week    Comment: 1 glass of wine/day.   Drug use: Never   Sexual activity: Not Currently  Other Topics Concern   Not on file  Social History Narrative   Lives in [place].  Works as Training and development officer.    Social Determinants of Health   Financial Resource Strain: Not on file  Food Insecurity: Not on file  Transportation Needs: Not on file  Physical Activity: Not on file  Stress: Not on file  Social Connections: Not on file     Family History: The patient's family history includes Breast cancer (age of onset: 33) in her mother; CAD in her father; Heart attack in her father; Hypertension in her mother.  ROS:   Please see the history of present illness.     All other systems reviewed and are negative.  EKGs/Labs/Other Studies Reviewed:    The following studies were reviewed today:  Echo 09/2019  1. Left ventricular ejection fraction, by estimation, is 55 to 60%. The  left ventricle has normal function. The left ventricle has no regional  wall motion abnormalities. Left ventricular diastolic parameters were  normal.   2. Right ventricular systolic function is normal. The right ventricular  size is normal. Tricuspid regurgitation signal is inadequate for assessing  PA pressure.   3. Challenging image quality    Echo 11/2017 Study Conclusions   - Procedure narrative: Transthoracic echocardiography. Image    quality was suboptimal. The study was technically difficult, as a    result of poor acoustic windows. Intravenous contrast (Definity)    was administered.  - Left ventricle: Systolic function was normal. The estimated    ejection fraction was in the range of 50% to 55%. Doppler    parameters are consistent with abnormal left ventricular    relaxation (grade 1 diastolic dysfunction).  - Left atrium: The atrium  was mildly dilated.  - Right ventricle: The cavity size was normal. Systolic function    was normal.  - Right atrium: The atrium was mildly dilated.   EKG:  EKG is  ordered today.  The ekg ordered today demonstrates Afib 86bpm, nonspecific ST/T wave changes  Recent Labs: 09/25/2020: BUN 26; Creatinine, Ser 1.35; Hemoglobin 11.5; Platelets 284; Potassium 4.8; Sodium 148  Recent Lipid Panel    Component Value Date/Time   CHOL 194 08/28/2013 0352   TRIG 122 08/28/2013  0352   HDL 56 08/28/2013 0352   VLDL 24 08/28/2013 0352   LDLCALC 114 (H) 08/28/2013 0352    Physical Exam:    VS:  BP (!) 140/100 (BP Location: Right Arm, Patient Position: Sitting)   Pulse 86   Ht 5\' 3"  (1.6 m)   Wt 254 lb (115.2 kg)   SpO2 95%   BMI 44.99 kg/m     Wt Readings from Last 3 Encounters:  09/08/21 254 lb (115.2 kg)  12/11/20 253 lb 12.8 oz (115.1 kg)  09/29/20 244 lb 0.8 oz (110.7 kg)     GEN:  Well nourished, well developed in no acute distress HEENT: Normal NECK: No JVD; No carotid bruits LYMPHATICS: No lymphadenopathy CARDIAC: Irreg Irreg, no murmurs, rubs, gallops RESPIRATORY:  Clear to auscultation without rales, wheezing or rhonchi  ABDOMEN: Soft, non-tender, non-distended MUSCULOSKELETAL:  No edema; No deformity  SKIN: Warm and dry NEUROLOGIC:  Alert and oriented x 3 PSYCHIATRIC:  Normal affect   ASSESSMENT:    1. Persistent atrial fibrillation ([place])   2. Chronic diastolic CHF (congestive heart failure) ([place])   3. Essential hypertension   4. Hyperlipidemia, mixed   5. OSA (obstructive sleep apnea)   6. Stage 3 chronic kidney disease, unspecified whether stage 3a or 3b CKD ([place])    PLAN:    In order of problems listed above:  Persistent  Afib She is in Afib today. Saw Dr. Caryl Comes in 11/2020 and ablation vs repeat cardioversion was discussed.  Patient didn't want to pursue ablation at that time, he chose to continue amiodarone and set patient up for cardioversion in the future.She  is on amiodarone 200mg  daily. I will have her see Dr. Caryl Comes to further discuss options, may need to discontinue amiodarone if she is permanent Afib. Continue Eliquis 5mg  BID for stroke ppx.   Chronic combined systolic and diastolic CHF She is euvolemic on exam. She takes lasix 40mg  daily. Continue Entresto 24-26mg BID and Coreg 12.5mg  BID.  HTN BP is mildly elevated today. Continue current medications.  HLD LDL 93 01/2021. Continue Crestor 10mg  daily.   OSA She is compliant with CPAP.  CKD stage 3 Most recent labs showed stable numbers.   Disposition: Follow up in 6 month(s) with MD/APP      Signed, Rickell Wiehe Ninfa Meeker, PA-C  09/08/2021 4:13 PM    [facility]

## 2021-09-08 NOTE — Patient Instructions (Addendum)
Medication Instructions:  ? ?Your physician recommends that you continue on your current medications as directed. Please refer to the Current Medication list given to you today. ? ?*If you need a refill on your cardiac medications before your next appointment, please call your pharmacy* ? ? ?Lab Work: ? ?None ordered ? ?Testing/Procedures: ? ?None ordered ? ? ?Follow-Up: ?At [facility], you and your health needs are our priority.  As part of our continuing mission to provide you with exceptional heart care, we have created designated Provider Care Teams.  These Care Teams include your primary Cardiologist (physician) and Advanced Practice Providers (APPs -  Physician Assistants and Nurse Practitioners) who all work together to provide you with the care you need, when you need it. ? ?We recommend signing up for the patient portal called "MyChart".  Sign up information is provided on this After Visit Summary.  MyChart is used to connect with patients for Virtual Visits (Telemedicine).  Patients are able to view lab/test results, encounter notes, upcoming appointments, etc.  Non-urgent messages can be sent to your provider as well.   ?To learn more about what you can do with MyChart, go to [email or web address].   ? ?Your next appointment:   ? ?Dr. Caryl Comes in 1 month (to discuss cardioversion) ? ?6 month(s) with Dr. Fletcher Anon or APP ? ?The format for your next appointment:   ?In Person ? ?Provider:   ?You may see Kathlyn Sacramento, MD or one of the following Advanced Practice Providers on your designated Care Team:   ?Murray Hodgkins, NP ?Christell Faith, PA-C ?Cadence Kathlen Mody, PA-C{ ? ? ?Important Information About Sugar ? ? ? ? ? ? ?

## 2021-10-29 ENCOUNTER — Ambulatory Visit: Payer: Medicare Other | Admitting: Internal Medicine

## 2021-11-13 ENCOUNTER — Encounter: Payer: Self-pay | Admitting: Internal Medicine

## 2021-11-13 ENCOUNTER — Ambulatory Visit (INDEPENDENT_AMBULATORY_CARE_PROVIDER_SITE_OTHER): Payer: Medicare Other | Admitting: Internal Medicine

## 2021-11-13 VITALS — BP 100/58 | HR 71 | Ht 63.0 in | Wt 255.0 lb

## 2021-11-13 DIAGNOSIS — I5042 Chronic combined systolic (congestive) and diastolic (congestive) heart failure: Secondary | ICD-10-CM

## 2021-11-13 DIAGNOSIS — I428 Other cardiomyopathies: Secondary | ICD-10-CM

## 2021-11-13 DIAGNOSIS — I4819 Other persistent atrial fibrillation: Secondary | ICD-10-CM | POA: Diagnosis not present

## 2021-11-13 DIAGNOSIS — I10 Essential (primary) hypertension: Secondary | ICD-10-CM | POA: Diagnosis not present

## 2021-11-13 NOTE — Progress Notes (Signed)
Patient Care Team: Marguarite Arbour, MD as PCP - General (Internal Medicine) Iran Ouch, MD as PCP - Cardiology (Cardiology) Duke Salvia, MD as PCP - Electrophysiology (Cardiology)   HPI  Stacey [name] is a 71 y.o. 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    Hospitalized 6/19 for initiation of dofetilide, reverted to sinus but developed TdP an dQT prolongation. It was washed out without recurrent TdP and amiodarone initiated.   Anticoagulation with Xarelto.  Bruising without bleeding.>> apixoban.   She saw Dr. Fawn Kirk 6/19 for consideration of catheter ablation and at that point elected to continue amiodarone.  Seen 8/20 with recurrent atrial fibrillation underwent DCCV  Amiodarone dosing has gone up and down with concerns of QT prolongation and bradycardia.   Underwent cardioversion most recently 10/21, found to be in atrial fibrillation preoperative for knee arthroplasty having been in sinus rhythm a week or so before in early April 2022  Amiodarone was resumed and she underwent cardioversion 6/22.  Was to see, per Dr MA Dr. Merita Norton 6/22 to discuss ablation, but she chose to pass  Recurrent cardioversion with adjunctive ranolazine.  Failed to hold sinus rhythm   The patient denies chest pain, nocturnal dyspnea, orthopnea .  There have been no palpitations, lightheadedness or syncope.  Complains of shortness of breath climbing up hills.  Some peripheral edema.  Treated sleep apnea..    Patient denies symptoms of respiratory, GI intolerance, sun sensitivity, neurological symptoms attributable to amiodarone.       DATE TEST EF   7/15 LHC  55-60 % No obs CAD  5/19 Echo   20.25 %   8/19 Echo  50-55%   6/21 Echo  55-60% LA size normal   Date Cr K TSH  LFTs Hgb  6/19 1.13  2.75 16 13.1(5/19)  10/19   11.9    12/19 1.2  8.15 18   7/20 1.4 4.4 1.83  13.0   6/22 1.19 3.9 1.626 (CE)  17 (CE)  9.7  6/23 (CE) 1.2 4.0 4.93  25 12.0       Records and Results Reviewed outpt labs and notes   Past Medical History:  Diagnosis Date   Anxiety    Aortic atherosclerosis ([facility])    Arthritis    "knees" (09/26/2017)   Bacteremia    a.09/2019 E. coli bacteremia - unclear source.   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 Dx 08/2013 in setting of rapid afib;  b. 08/2013 Echo: EF 55-60%, mildly dil LA, nl RV; c. 08/2017 Echo: EF 20-25% (in afib); d. 11/2017 Echo: EF 50-55%; e. 09/2019 Echo: EF 55-60%, no rwma.   CKD (chronic kidney disease), stage III ([facility])    Drug-induced torsades de pointes ([facility]) 10/06/2017   GERD (gastroesophageal reflux disease)    History of blood transfusion 1975   "related to ectopic pregnancy   HLD (hyperlipidemia)    Hypertension    Hypothyroidism    Non-obstructive CAD    a. 10/2013 Myoview: anteroseptal, apical, septal mild ischemia, nl LV fxn;  b. Cath: LAD 63m LCX 20ost, RCA 40m.   Obesity    OSA on CPAP    Paroxysmal atrial fibrillation ([facility])    a. Dx 08/2013. CHA2DS2VASc = 4 (Eliquis); b. Prev on flecainide w/ multiple DCCVs; c. 09/2017 Tikosyn added-->TdP 2/2 prolonged QT->amio; d. 11/2018 s/p DCCV-amio cont; e. 01/2020 recurrent Afib on low-dose amio-->DCCV (200J x  3), Amio cont @ 200 Daily.   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 08/2017 Echo: EF 20-25%, diff HK in setting of Afib w/ RVR; b. 11/2017 Echo: EF 50-55%; c. 09/2019 Echo: EF 55-60%.    Past Surgical History:  Procedure Laterality Date   BREAST BIOPSY Right 2007   stereo-neg   CARDIAC CATHETERIZATION  [facility]/2013   [facility]   CARDIOVERSION N/A 08/29/2017   Procedure: CARDIOVERSION;  Surgeon: Iran Ouch, MD;  Location: [facility] ORS;  Service: Cardiovascular;  Laterality: N/A;   CARDIOVERSION     "I've had a total of 4" (09/26/2017)   CARDIOVERSION N/A 12/08/2018   Procedure: CARDIOVERSION;  Surgeon: Antonieta Iba, MD;  Location: [facility] OR[facility];   Service: Cardiovascular;  Laterality: N/A;   CARDIOVERSION N/A 02/18/2020   Procedure: CARDIOVERSION;  Surgeon: Iran Ouch, MD;  Location: [facility];  Service: Cardiovascular;  Laterality: N/A;   CARDIOVERSION N/A 09/29/2020   Procedure: CARDIOVERSION;  Surgeon: Iran Ouch, MD;  Location: [facility];  Service: Cardiovascular;  Laterality: N/A;   ECTOPIC PREGNANCY SURGERY  1975   ORIF ANKLE FRACTURE Right 10/01/2019   Procedure: OPEN REDUCTION INTERNAL FIXATION (ORIF) ANKLE FRACTURE;  Surgeon: Christena Flake, MD;  Location: [facility] OR[facility];  Service: Orthopedics;  Laterality: Right;   TONSILLECTOMY     TOTAL KNEE ARTHROPLASTY Left 08/07/2020   Procedure: TOTAL KNEE ARTHROPLASTY;  Surgeon: Christena Flake, MD;  Location: [facility];  Service: Orthopedics;  Laterality: Left;    Current Meds  Medication Sig   apixaban (ELIQUIS) 5 MG TABS tablet Take 1 tablet (5 mg total) by mouth 2 (two) times daily.   carvedilol (COREG) 25 MG tablet Take 0.5 tablets (12.5 mg total) by mouth 2 (two) times daily.   desvenlafaxine (PRISTIQ) 50 MG 24 hr tablet Take 50 mg by mouth daily.   diclofenac sodium (VOLTAREN) 1 % GEL Apply 1 application topically 4 (four) times daily as needed (knee pain).   furosemide (LASIX) 40 MG tablet Take 40 mg by mouth daily.   HYDROcodone-acetaminophen (NORCO) 10-325 MG tablet Take 1 tablet by mouth every 4 (four) hours as needed for moderate pain.   levothyroxine (SYNTHROID) 100 MCG tablet Take 100 mcg by mouth daily before breakfast.   magnesium oxide (MAG-OX) 400 MG tablet Take 1 tablet (400 mg total) by mouth daily.   Multiple Vitamin (MULTIVITAMIN ADULT PO) Take 1 tablet by mouth daily.   ondansetron (ZOFRAN-ODT) 4 MG disintegrating tablet Take 4 mg by mouth every 8 (eight) hours as needed for nausea or vomiting.   QUEtiapine (SEROQUEL) 25 MG tablet Take 25 mg by mouth at bedtime.   rosuvastatin (CRESTOR) 10 MG tablet Take 10 mg by mouth at bedtime.   sacubitril-valsartan  (ENTRESTO) 24-26 MG Take 1 tablet by mouth 2 (two) times daily.   [DISCONTINUED] amiodarone (PACERONE) 200 MG tablet Take 1 tablet (200 mg total) by mouth daily.   [DISCONTINUED] esomeprazole (NEXIUM) 40 MG capsule TAKE 1 CAPSULE BY MOUTH EVERY MORNING   [DISCONTINUED] oxyCODONE-acetaminophen (PERCOCET) 10-325 MG tablet Take 1 tablet by mouth every 4 (four) hours as needed.   [DISCONTINUED] venlafaxine (EFFEXOR) 37.5 MG tablet Take 37.5 mg by mouth 2 (two) times daily.    Allergies  Allergen Reactions   Levaquin [Levofloxacin In D5w] Other (See Comments)    Severe dizziness    Metoprolol Itching, Nausea And Vomiting, Rash and Other (See Comments)  Dizziness   Tikosyn [Dofetilide] Other (See Comments)    Developed Torsades on Tikosyn 09/28/17   Ace Inhibitors Cough   Adhesive [Tape] Rash      Review of Systems negative except from HPI and PMH  Physical Exam  BP (!) 100/58   Pulse 71   Ht 5\' 3"  (1.6 m)   Wt 255 lb (115.7 kg)   BMI 45.17 kg/m  Well developed and Morbidly obese  in no acute distress HENT normal Neck supple  Clear Irregularly irregular rate and rhythm with controlled ventricular response, no murmurs or gallops Abd-soft   No Clubbing cyanosis tr edema Skin-warm and dry A & Oriented  Grossly normal sensory and motor function   ECG atrial fibrillation at 76 Intervals-/08/35  Assessment and  Plan  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  Not interested in pursuing cardioversion.  We will discontinue amiodarone.  Heart rates at home she says are often in the 60s.  We will plan to see her again in 6 months and she is advised to let us know if her heart rate start  drifting into the 80s and 90s prior to then.  We will continue her carvedilol at 12.5 twice daily.  No bleeding.  We will continue her on apixaban 5 mg twice daily.  With her recovered cardiomyopathy, continue the [facility] 24/26.  3 edema we will continue her furosemide 40 mg daily.  Marland Kitchen

## 2021-11-13 NOTE — Patient Instructions (Signed)
Medication Instructions:  - Your physician has recommended you make the following change in your medication:   1) STOP Amiodarone  *If you need a refill on your cardiac medications before your next appointment, please call your pharmacy*   Lab Work: - none ordered  If you have labs (blood work) drawn today and your tests are completely normal, you will receive your results only by: MyChart Message (if you have MyChart) OR A paper copy in the mail If you have any lab test that is abnormal or we need to change your treatment, we will call you to review the results.   Testing/Procedures: - none ordered   Follow-Up: At [facility], you and your health needs are our priority.  As part of our continuing mission to provide you with exceptional heart care, we have created designated Provider Care Teams.  These Care Teams include your primary Cardiologist (physician) and Advanced Practice Providers (APPs -  Physician Assistants and Nurse Practitioners) who all work together to provide you with the care you need, when you need it.  We recommend signing up for the patient portal called "MyChart".  Sign up information is provided on this After Visit Summary.  MyChart is used to connect with patients for Virtual Visits (Telemedicine).  Patients are able to view lab/test results, encounter notes, upcoming appointments, etc.  Non-urgent messages can be sent to your provider as well.   To learn more about what you can do with MyChart, go to [email or web address].    Your next appointment:   6 month(s)  The format for your next appointment:   In Person  Provider:   Sherryl Manges, MD    Other Instructions N/a  Important Information About Sugar

## 2022-01-24 DEATH — deceased

## 2022-03-11 ENCOUNTER — Ambulatory Visit: Payer: Medicare Other | Admitting: Cardiovascular Disease

## 2022-12-17 IMAGING — DX DG KNEE 1-2V PORT*L*
2 series · 2 of 2 positions shown · non-contrast
Comparison: Portable exam 4741 hours without priors for comparison

CLINICAL DATA: Post total knee replacement LEFT

EXAM:
PORTABLE LEFT KNEE - 1-2 VIEW

[knee ap]
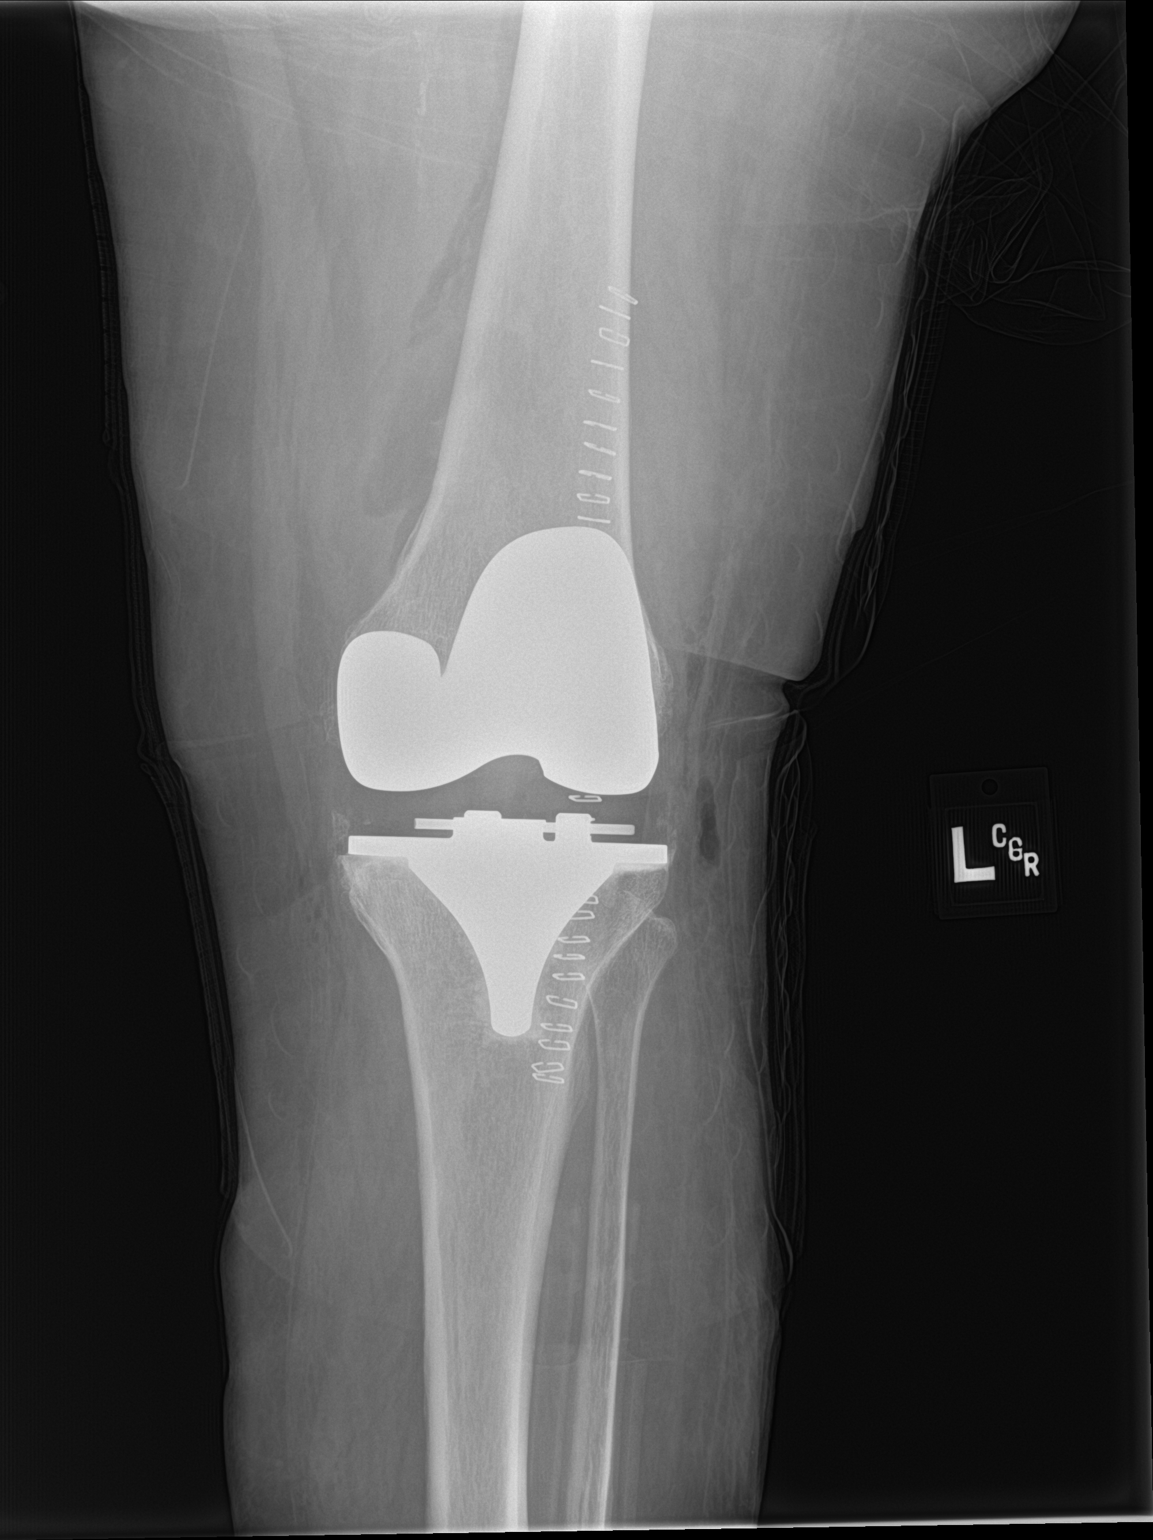

[knee lat]
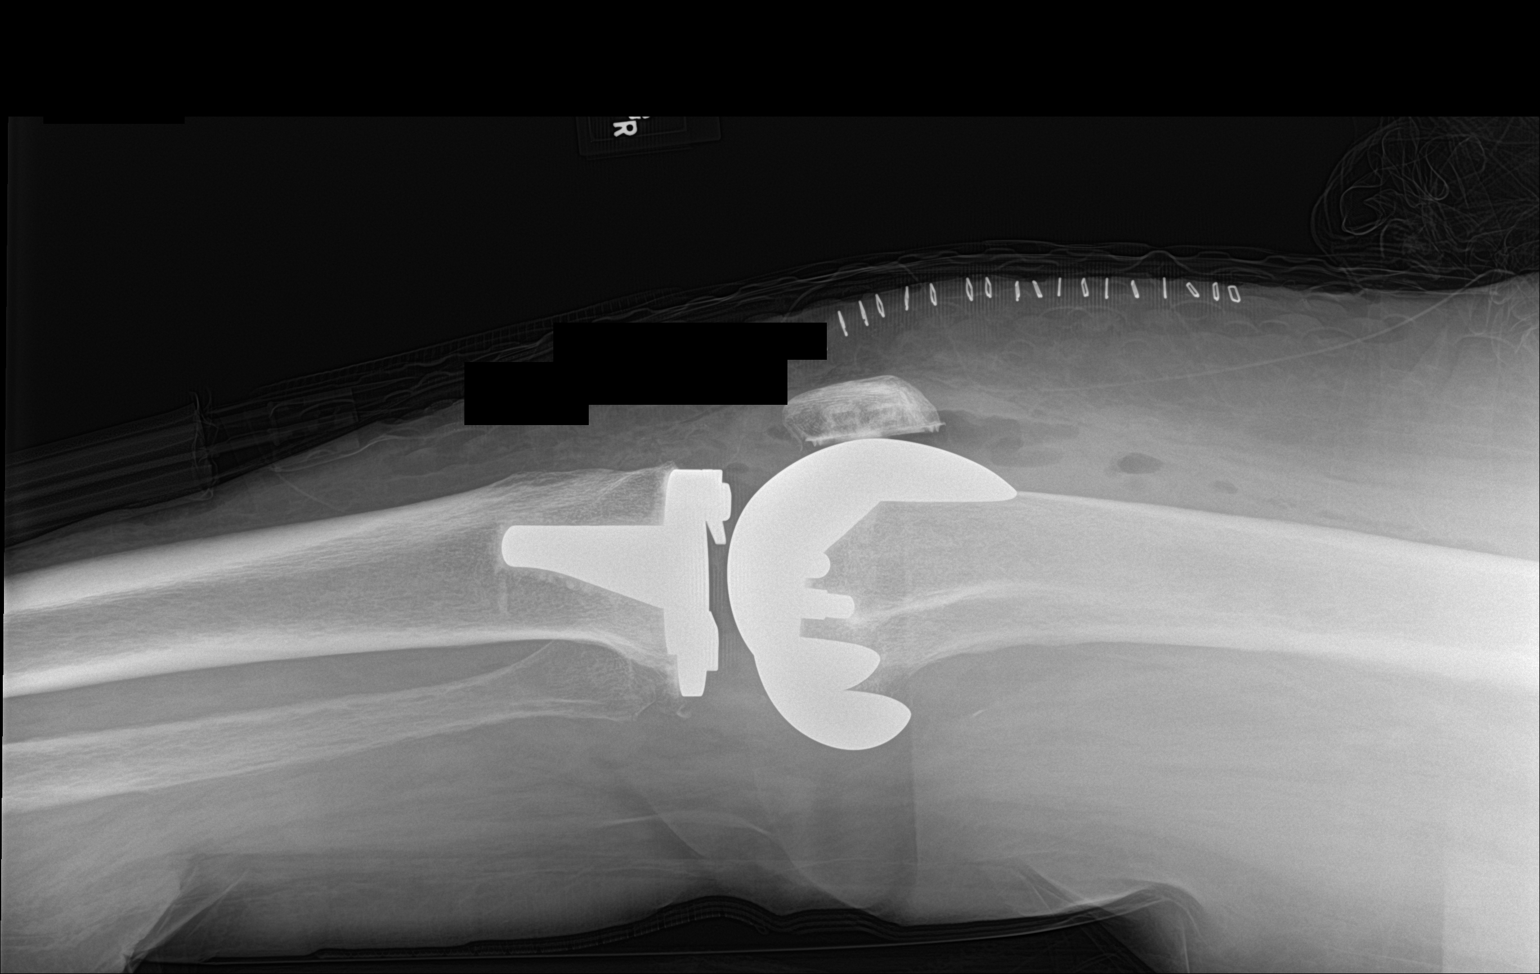

[2 of 2 positions shown; findings below may reference images not displayed]

FINDINGS: Mild osseous demineralization.

Components of a LEFT knee prosthesis are identified in expected
position.

No acute fracture, dislocation, or bone destruction.

Anterior skin clips and postsurgical soft tissue changes.
IMPRESSION: LEFT knee prosthesis without acute abnormalities.
